# Patient Record
Sex: Female | Born: 1940
Health system: Southern US, Community
[De-identification: ages and names within clinical notes are randomized; demographics above are authoritative.]

## PROBLEM LIST (undated history)

## (undated) DIAGNOSIS — M1990 Unspecified osteoarthritis, unspecified site: Secondary | ICD-10-CM

## (undated) DIAGNOSIS — E785 Hyperlipidemia, unspecified: Secondary | ICD-10-CM

## (undated) DIAGNOSIS — K219 Gastro-esophageal reflux disease without esophagitis: Secondary | ICD-10-CM

## (undated) DIAGNOSIS — I472 Ventricular tachycardia, unspecified: Secondary | ICD-10-CM

## (undated) DIAGNOSIS — Z9289 Personal history of other medical treatment: Secondary | ICD-10-CM

## (undated) DIAGNOSIS — I2119 ST elevation (STEMI) myocardial infarction involving other coronary artery of inferior wall: Secondary | ICD-10-CM

## (undated) DIAGNOSIS — T7840XA Allergy, unspecified, initial encounter: Secondary | ICD-10-CM

## (undated) DIAGNOSIS — D219 Benign neoplasm of connective and other soft tissue, unspecified: Secondary | ICD-10-CM

## (undated) DIAGNOSIS — I2510 Atherosclerotic heart disease of native coronary artery without angina pectoris: Secondary | ICD-10-CM

## (undated) DIAGNOSIS — I10 Essential (primary) hypertension: Secondary | ICD-10-CM

## (undated) DIAGNOSIS — E669 Obesity, unspecified: Secondary | ICD-10-CM

## (undated) DIAGNOSIS — I255 Ischemic cardiomyopathy: Secondary | ICD-10-CM

## (undated) DIAGNOSIS — E039 Hypothyroidism, unspecified: Secondary | ICD-10-CM

## (undated) DIAGNOSIS — G454 Transient global amnesia: Secondary | ICD-10-CM

## (undated) DIAGNOSIS — L719 Rosacea, unspecified: Secondary | ICD-10-CM

## (undated) DIAGNOSIS — Z5189 Encounter for other specified aftercare: Secondary | ICD-10-CM

## (undated) DIAGNOSIS — K439 Ventral hernia without obstruction or gangrene: Secondary | ICD-10-CM

## (undated) HISTORY — PX: CERVICAL POLYPECTOMY: SHX88

## (undated) HISTORY — PX: OTHER SURGICAL HISTORY: SHX169

## (undated) HISTORY — PX: COCHLEAR IMPLANT: SUR684

## (undated) HISTORY — PX: CHOLECYSTECTOMY: SHX55

## (undated) HISTORY — DX: Allergy, unspecified, initial encounter: T78.40XA

## (undated) HISTORY — DX: Encounter for other specified aftercare: Z51.89

## (undated) HISTORY — DX: Personal history of other medical treatment: Z92.89

## (undated) HISTORY — PX: UPPER GASTROINTESTINAL ENDOSCOPY: SHX188

## (undated) HISTORY — DX: Transient global amnesia: G45.4

## (undated) HISTORY — DX: Gastro-esophageal reflux disease without esophagitis: K21.9

## (undated) HISTORY — DX: Rosacea, unspecified: L71.9

## (undated) HISTORY — DX: Ischemic cardiomyopathy: I25.5

---

## 1998-01-27 ENCOUNTER — Other Ambulatory Visit: Admission: RE | Admit: 1998-01-27 | Discharge: 1998-01-27 | Payer: Self-pay | Admitting: Obstetrics and Gynecology

## 2000-04-24 ENCOUNTER — Other Ambulatory Visit: Admission: RE | Admit: 2000-04-24 | Discharge: 2000-04-24 | Payer: Self-pay | Admitting: Obstetrics and Gynecology

## 2001-08-20 ENCOUNTER — Other Ambulatory Visit: Admission: RE | Admit: 2001-08-20 | Discharge: 2001-08-20 | Payer: Self-pay | Admitting: Obstetrics and Gynecology

## 2001-11-13 ENCOUNTER — Emergency Department (HOSPITAL_COMMUNITY): Admission: EM | Admit: 2001-11-13 | Discharge: 2001-11-13 | Payer: Self-pay | Admitting: Emergency Medicine

## 2003-07-08 ENCOUNTER — Other Ambulatory Visit: Admission: RE | Admit: 2003-07-08 | Discharge: 2003-07-08 | Payer: Self-pay | Admitting: Obstetrics and Gynecology

## 2004-07-12 ENCOUNTER — Other Ambulatory Visit: Admission: RE | Admit: 2004-07-12 | Discharge: 2004-07-12 | Payer: Self-pay | Admitting: Obstetrics and Gynecology

## 2007-11-19 ENCOUNTER — Ambulatory Visit: Payer: Self-pay | Admitting: Internal Medicine

## 2007-11-19 DIAGNOSIS — K219 Gastro-esophageal reflux disease without esophagitis: Secondary | ICD-10-CM

## 2007-11-19 HISTORY — DX: Gastro-esophageal reflux disease without esophagitis: K21.9

## 2007-11-20 ENCOUNTER — Ambulatory Visit: Payer: Self-pay | Admitting: Internal Medicine

## 2008-02-24 ENCOUNTER — Ambulatory Visit (HOSPITAL_COMMUNITY): Admission: RE | Admit: 2008-02-24 | Discharge: 2008-02-25 | Payer: Self-pay | Admitting: Otolaryngology

## 2008-09-09 ENCOUNTER — Ambulatory Visit (HOSPITAL_COMMUNITY): Admission: RE | Admit: 2008-09-09 | Discharge: 2008-09-09 | Payer: Self-pay | Admitting: Obstetrics and Gynecology

## 2008-09-09 ENCOUNTER — Encounter (INDEPENDENT_AMBULATORY_CARE_PROVIDER_SITE_OTHER): Payer: Self-pay | Admitting: Obstetrics and Gynecology

## 2009-05-09 ENCOUNTER — Ambulatory Visit (HOSPITAL_COMMUNITY): Admission: RE | Admit: 2009-05-09 | Discharge: 2009-05-09 | Payer: Self-pay | Admitting: Internal Medicine

## 2009-05-10 ENCOUNTER — Ambulatory Visit (HOSPITAL_COMMUNITY): Admission: RE | Admit: 2009-05-10 | Discharge: 2009-05-10 | Payer: Self-pay | Admitting: Internal Medicine

## 2009-06-16 ENCOUNTER — Encounter (INDEPENDENT_AMBULATORY_CARE_PROVIDER_SITE_OTHER): Payer: Self-pay | Admitting: Surgery

## 2009-06-16 ENCOUNTER — Ambulatory Visit (HOSPITAL_COMMUNITY): Admission: RE | Admit: 2009-06-16 | Discharge: 2009-06-17 | Payer: Self-pay | Admitting: Surgery

## 2009-07-03 ENCOUNTER — Encounter: Payer: Self-pay | Admitting: Internal Medicine

## 2010-04-17 NOTE — Letter (Signed)
Summary: [facility]   Imported By: Lester [place] 07/19/2009 10:12:01  _____________________________________________________________________  External Attachment:    Type:   Image     Comment:   External Document

## 2010-06-11 LAB — COMPREHENSIVE METABOLIC PANEL
AST: 22 U/L (ref 0–37)
BUN: 8 mg/dL (ref 6–23)
CO2: 28 mEq/L (ref 19–32)
Chloride: 106 mEq/L (ref 96–112)
Creatinine, Ser: 0.82 mg/dL (ref 0.4–1.2)
GFR calc Af Amer: >60 mL/min (ref >60)
Glucose, Bld: 91 mg/dL (ref 70–99)

## 2010-06-25 LAB — TYPE AND SCREEN: ABO/RH(D): A POS

## 2010-06-25 LAB — ABO/RH: ABO/RH(D): A POS

## 2010-06-25 LAB — COMPREHENSIVE METABOLIC PANEL
ALT: 24 U/L (ref 0–35)
Alkaline Phosphatase: 75 U/L (ref 39–117)
CO2: 28 mEq/L (ref 19–32)
Chloride: 106 mEq/L (ref 96–112)
GFR calc Af Amer: >60 mL/min (ref >60)
Potassium: 4 mEq/L (ref 3.5–5.1)
Total Bilirubin: 1 mg/dL (ref 0.3–1.2)

## 2010-06-25 LAB — CBC
Hemoglobin: 14.8 g/dL (ref 12.0–15.0)
MCV: 85.3 fL (ref 78.0–100.0)
RBC: 5.03 MIL/uL (ref 3.87–5.11)

## 2010-07-31 NOTE — H&P (Signed)
NAMEMARLISS, [name]             ACCOUNT NO.:  [id number]   MEDICAL RECORD NO.:  [id number]         PATIENT TYPE:  AMB   LOCATION:                                FACILITY:  [facility]   PHYSICIAN:  Duke Salvia. Marcelle Overlie, M.D.DATE OF BIRTH:  29-Jun-1940   DATE OF ADMISSION:  09/09/2008  DATE OF DISCHARGE:                              HISTORY & PHYSICAL   CHIEF COMPLAINT:  Abnormal bleeding, cervical polyp.   HISTORY OF PRESENT ILLNESS:  A 70 year old postmenopausal patient who  was in for a routine exam within the past month.  Findings at that time  demonstrated a large polyp protruding from the cervix.  She presents now  for D and C, hysteroscopy, and excision of a cervical polyp.  This  procedure including the risks related to bleeding, infection, adjacent  organ injury, all reviewed with her, which she understands and accepts.   PAST MEDICAL HISTORY:   ALLERGIES:  None.   CURRENT MEDICATIONS:  Synthroid, Nexium, MetroGel, baby aspirin daily,  and vitamin supplement.   SURGICAL HISTORY:  Tubal ligation in 1980.  Her medical doctor is Dr.  Jacky Kindle.   SOCIAL HISTORY:  Denies alcohol, tobacco, or drug use.  She is divorced.   REVIEW OF SYSTEMS:  Significant for hypothyroidism, currently on  Synthroid, Rosacea.   FAMILY HISTORY:  Significant for a mother with heart disease, kidney  disease, diabetes, and unspecified cancer.   PHYSICAL EXAMINATION:  VITAL SIGNS:  Temp 98.8, BP 150/90.  HEENT: Unremarkable.  NECK:  Supple without masses.  LUNGS:  Clear.  CARDIOVASCULAR:  Regular rate and rhythm without murmurs, rubs, or  gallops.  BREASTS:  Without masses.  ABDOMEN:  Soft, flat, and nontender.  PELVIC:  Vulva and vagina are normal.  There is a large polyp protruding  from the cervix.  Recent Pap showed ascus with atrophy.  Uterus  otherwise normal size.  Adnexa unremarkable.   IMPRESSION:  Large endocervical polyp.   PLAN:  D and C, hysteroscopy, removal of polyps.  Procedure  and risk  reviewed as above.      Richard M. Marcelle Overlie, M.D.  Electronically Signed     [facility]/MEDQ  D:  08/30/2008  T:  08/31/2008  Job:  [id number]

## 2010-07-31 NOTE — Op Note (Signed)
[name], [name]             ACCOUNT NO.:  [id number]   MEDICAL RECORD NO.:  [id number]          PATIENT TYPE:  OIB   LOCATION:  5128                         FACILITY:  [facility]   PHYSICIAN:  Newman Pies, MD            DATE OF BIRTH:  1940-10-09   DATE OF PROCEDURE:  02/24/2008  DATE OF DISCHARGE:                               OPERATIVE REPORT   SURGEON:  Newman Pies, MD   PREOPERATIVE DIAGNOSIS:  Bilateral profound sensorineural hearing loss.   POSTOPERATIVE DIAGNOSIS:  Bilateral profound sensorineural hearing loss.   PROCEDURE PERFORMED:  Right cochlear implantation.   ANESTHESIA:  General endotracheal tube anesthesia.   COMPLICATIONS:  None.   ESTIMATED BLOOD LOSS:  Minimal.   INDICATIONS FOR PROCEDURE:  [name] is a 70 year old white  female with a history of bilateral progressive sensorineural hearing  loss.  At her most recent evaluation, she was noted to have bilateral  profound hearing loss.  She was not able to obtain any benefit with her  hearing aids.  Based on the above findings, the decision was made for  the patient to undergo right cochlear implantation.  The risks,  benefits, alternatives, and details of the procedure were discussed with  the patient and her sister.  Questions were invited and answered.  Informed consent was obtained.   DESCRIPTION:  The patient was taken to the operating room and placed in  supine on the operating table.  General endotracheal tube anesthesia was  administered by the anesthesiologist.  Preop IV antibiotics were given.  The patient was positioned and prepped and draped in the standard  fashion for right cochlear implantation.  The patient's nerve-monitoring  electrodes were placed.  The facial nerve monitor was noted to be  operational throughout the case.  Lidocaine 1% with 1:100,000  epinephrine were injected in the postauricular crease.  A standard  postauricular incision was made.  The periosteum was elevated over the  mastoid bone.  A standard cortical mastoidectomy was performed.  The  tegmen, sigmoid sinus, and the posterior ear canal wall were used as the  landmark.  The antrum was subsequently entered.  The facial recess was  opened in the standard fashion.  The middle ear space was noted to be  normal.  The middle ear ossicles were all present.  At this point, the  attention was focused on making pocket for the implant processor.  The  periosteal soft tissue over the right skull was elevated.  A 5-0 cutting  drill was used to make an impression of the implant processor.  A  template was used to obtain the appropriate size impression.  Attention  was then refocused on the middle ear space.  A 1 mm cochleostomy was  performed.  Then, inner ear labyrinth was noted to be patent.  CI512  Nucleus cochlear implant was then placed in the skull pocket.  The  implant electrodes was inserted into the inner ear via the cochleostomy  opening without difficulty.  Its insertion was achieved.  The ground  electrode was then placed within the soft tissue  pocket.  The skin  incision was closed in layers with 3-0 Vicryl and 5-0 plain gut sutures.  A Glasscock dressing was applied.  The care of the patient was turned  over to the anesthesiologist.  Plain film of the skull was obtained,  confirming the placement of the cochlear implant electrodes.  The  patient was awakened from anesthesia without difficulty.  She was  transferred to the recovery room in good condition.   OPERATIVE FINDINGS:  An 1-mm medial cochleostomy was performed without  difficulty.  Nucleus CI512 cochlear implant was placed.   SPECIMENS REMOVED:  None.   FOLLOWUP CARE:  The patient will be observed overnight in the hospital.  She will be discharged home once postop day #1.  She will follow up in  my office in 1 week.      Newman Pies, MD  Electronically Signed     ST/MEDQ  D:  02/24/2008  T:  02/24/2008  Job:  [id number]

## 2010-07-31 NOTE — Op Note (Signed)
NAMEMONTSERRATH, MADDING             ACCOUNT NO.:  [id number]   MEDICAL RECORD NO.:  [id number]          PATIENT TYPE:  AMB   LOCATION:  SDC                           FACILITY:  [facility]   PHYSICIAN:  Duke Salvia. Marcelle Overlie, M.D.DATE OF BIRTH:  [birth year]   DATE OF PROCEDURE:  09/09/2008  DATE OF DISCHARGE:  09/09/2008                               OPERATIVE REPORT   PREOPERATIVE DIAGNOSIS:  Large endocervical polyp.   POSTOPERATIVE DIAGNOSIS:  Large endocervical polyp.   PROCEDURES:  Excision of large endocervical polyp, dilatation and  curettage, hysteroscopy.   SURGEON:  Duke Salvia. Marcelle Overlie, MD   ANESTHESIA:  General.   COMPLICATIONS:  None.   DRAINS:  In and out Foley catheter.   BLOOD LOSS:  Minimal.   SPECIMENS REMOVED:  Large endocervical polyp, endometrial curettings.   COMPLICATIONS:  None.   PROCEDURE AND FINDINGS:  The patient was taken to the operating room.  After an adequate level of general endotracheal anesthesia was obtained  with the patient's legs in stirrups, the perineum and vagina were  prepped and draped in the usual manner for D and C.  A weighted speculum  was positioned.  Cervix grasped with a tenaculum.  Paracervical block  created by infiltrating at 3 and 9 o'clock submucosally, 5-7 mL of 1%  Xylocaine on either side after negative aspiration.  Ring forceps then  used to grasp the large endocervical polyp, which was placed on some  traction with the intent to place an Endoloop over the origin to tie  down before excision, this avulsed during the process, was sent  separately.  At that point, the uterus was sounded to 7-8 cm, was  sufficiently dilated to allow passage of the 4-mm continuous  hysteroscope.  Moderate amount of tissue was noted, so the scope was  removed, D and C was then carried out with endometrial curettings, sent  separately.  The scope was then reinserted.  The cavity was irrigated  and noted to be at that point unremarkable.  Minimal  bleeding.  She  tolerated this well, went to recovery room in good condition.      Richard M. Marcelle Overlie, M.D.  Electronically Signed    [facility]/MEDQ  D:  09/09/2008  T:  09/10/2008  Job:  [id number]

## 2010-12-21 LAB — BASIC METABOLIC PANEL
Chloride: 108 mEq/L (ref 96–112)
GFR calc Af Amer: >60 mL/min (ref >60)
Potassium: 4.4 mEq/L (ref 3.5–5.1)
Sodium: 141 mEq/L (ref 135–145)

## 2010-12-21 LAB — CBC
HCT: 44.5 % (ref 36.0–46.0)
Hemoglobin: 14.6 g/dL (ref 12.0–15.0)
Platelets: 236 10*3/uL (ref 150–400)
RBC: 5.17 MIL/uL — ABNORMAL HIGH (ref 3.87–5.11)

## 2010-12-27 ENCOUNTER — Ambulatory Visit (AMBULATORY_SURGERY_CENTER): Payer: Medicare Other | Admitting: *Deleted

## 2010-12-27 VITALS — Ht 66.0 in | Wt 220.7 lb

## 2010-12-27 DIAGNOSIS — Z1211 Encounter for screening for malignant neoplasm of colon: Secondary | ICD-10-CM

## 2010-12-27 MED ORDER — PEG-KCL-NACL-NASULF-NA ASC-C 100 G PO SOLR: 1.0000 | Freq: Once | ORAL | Status: DC

## 2010-12-27 NOTE — Progress Notes (Signed)
CANNOT HAVE MRI due to COCHLEAR IMPLANT TO RIGHT EAR

## 2011-01-11 ENCOUNTER — Other Ambulatory Visit: Payer: Medicare Other | Admitting: Internal Medicine

## 2011-01-18 ENCOUNTER — Ambulatory Visit (AMBULATORY_SURGERY_CENTER): Payer: Medicare Other | Admitting: Internal Medicine

## 2011-01-18 ENCOUNTER — Encounter: Payer: Self-pay | Admitting: Internal Medicine

## 2011-01-18 VITALS — BP 145/82 | HR 76 | Temp 98.6°F | Resp 20 | Ht 66.0 in | Wt 220.0 lb

## 2011-01-18 DIAGNOSIS — D126 Benign neoplasm of colon, unspecified: Secondary | ICD-10-CM

## 2011-01-18 DIAGNOSIS — D128 Benign neoplasm of rectum: Secondary | ICD-10-CM

## 2011-01-18 DIAGNOSIS — D129 Benign neoplasm of anus and anal canal: Secondary | ICD-10-CM

## 2011-01-18 DIAGNOSIS — Z1211 Encounter for screening for malignant neoplasm of colon: Secondary | ICD-10-CM

## 2011-01-18 DIAGNOSIS — K5730 Diverticulosis of large intestine without perforation or abscess without bleeding: Secondary | ICD-10-CM

## 2011-01-18 MED ORDER — SODIUM CHLORIDE 0.9 % IV SOLN: 500.0000 mL | INTRAVENOUS | Status: DC

## 2011-01-18 NOTE — Patient Instructions (Signed)
Handouts on polyps and diverticulosis and high fiber diet  Discharge instructions per blue and green sheets  Repeat colonoscopy in 3 years. We will mail you a letter to remind you of this  We will send the polyps to pathology and we will mail you a letter in 1-2 weeks with the results and dr Broadus John recommendations

## 2011-01-21 ENCOUNTER — Telehealth: Payer: Self-pay | Admitting: *Deleted

## 2011-01-21 NOTE — Telephone Encounter (Signed)

## 2011-07-03 DIAGNOSIS — E785 Hyperlipidemia, unspecified: Secondary | ICD-10-CM | POA: Diagnosis not present

## 2011-07-03 DIAGNOSIS — I10 Essential (primary) hypertension: Secondary | ICD-10-CM | POA: Diagnosis not present

## 2011-07-03 DIAGNOSIS — E039 Hypothyroidism, unspecified: Secondary | ICD-10-CM | POA: Diagnosis not present

## 2011-07-03 DIAGNOSIS — K219 Gastro-esophageal reflux disease without esophagitis: Secondary | ICD-10-CM | POA: Diagnosis not present

## 2011-07-08 DIAGNOSIS — H16209 Unspecified keratoconjunctivitis, unspecified eye: Secondary | ICD-10-CM | POA: Diagnosis not present

## 2011-07-16 DIAGNOSIS — H16209 Unspecified keratoconjunctivitis, unspecified eye: Secondary | ICD-10-CM | POA: Diagnosis not present

## 2011-07-24 DIAGNOSIS — H2510 Age-related nuclear cataract, unspecified eye: Secondary | ICD-10-CM | POA: Diagnosis not present

## 2011-07-24 DIAGNOSIS — H5210 Myopia, unspecified eye: Secondary | ICD-10-CM | POA: Diagnosis not present

## 2011-08-29 DIAGNOSIS — H04129 Dry eye syndrome of unspecified lacrimal gland: Secondary | ICD-10-CM | POA: Diagnosis not present

## 2011-11-28 DIAGNOSIS — H903 Sensorineural hearing loss, bilateral: Secondary | ICD-10-CM | POA: Diagnosis not present

## 2011-12-06 DIAGNOSIS — H903 Sensorineural hearing loss, bilateral: Secondary | ICD-10-CM | POA: Diagnosis not present

## 2011-12-16 DIAGNOSIS — E039 Hypothyroidism, unspecified: Secondary | ICD-10-CM | POA: Diagnosis not present

## 2011-12-16 DIAGNOSIS — E785 Hyperlipidemia, unspecified: Secondary | ICD-10-CM | POA: Diagnosis not present

## 2011-12-16 DIAGNOSIS — I10 Essential (primary) hypertension: Secondary | ICD-10-CM | POA: Diagnosis not present

## 2011-12-23 DIAGNOSIS — E039 Hypothyroidism, unspecified: Secondary | ICD-10-CM | POA: Diagnosis not present

## 2011-12-23 DIAGNOSIS — Z0000 Encounter for general adult medical examination without abnormal findings: Secondary | ICD-10-CM | POA: Diagnosis not present

## 2011-12-23 DIAGNOSIS — Z23 Encounter for immunization: Secondary | ICD-10-CM | POA: Diagnosis not present

## 2011-12-23 DIAGNOSIS — I10 Essential (primary) hypertension: Secondary | ICD-10-CM | POA: Diagnosis not present

## 2011-12-23 DIAGNOSIS — E785 Hyperlipidemia, unspecified: Secondary | ICD-10-CM | POA: Diagnosis not present

## 2011-12-24 DIAGNOSIS — Z1212 Encounter for screening for malignant neoplasm of rectum: Secondary | ICD-10-CM | POA: Diagnosis not present

## 2011-12-25 DIAGNOSIS — Z01419 Encounter for gynecological examination (general) (routine) without abnormal findings: Secondary | ICD-10-CM | POA: Diagnosis not present

## 2011-12-25 DIAGNOSIS — Z124 Encounter for screening for malignant neoplasm of cervix: Secondary | ICD-10-CM | POA: Diagnosis not present

## 2012-01-02 DIAGNOSIS — Z1231 Encounter for screening mammogram for malignant neoplasm of breast: Secondary | ICD-10-CM | POA: Diagnosis not present

## 2012-01-08 DIAGNOSIS — R928 Other abnormal and inconclusive findings on diagnostic imaging of breast: Secondary | ICD-10-CM | POA: Diagnosis not present

## 2012-02-20 DIAGNOSIS — H903 Sensorineural hearing loss, bilateral: Secondary | ICD-10-CM | POA: Diagnosis not present

## 2012-06-17 DIAGNOSIS — I10 Essential (primary) hypertension: Secondary | ICD-10-CM | POA: Diagnosis not present

## 2012-06-17 DIAGNOSIS — K219 Gastro-esophageal reflux disease without esophagitis: Secondary | ICD-10-CM | POA: Diagnosis not present

## 2012-06-17 DIAGNOSIS — E039 Hypothyroidism, unspecified: Secondary | ICD-10-CM | POA: Diagnosis not present

## 2012-06-17 DIAGNOSIS — E785 Hyperlipidemia, unspecified: Secondary | ICD-10-CM | POA: Diagnosis not present

## 2012-07-16 DIAGNOSIS — H903 Sensorineural hearing loss, bilateral: Secondary | ICD-10-CM | POA: Diagnosis not present

## 2012-07-30 DIAGNOSIS — H903 Sensorineural hearing loss, bilateral: Secondary | ICD-10-CM | POA: Diagnosis not present

## 2012-11-11 DIAGNOSIS — H2510 Age-related nuclear cataract, unspecified eye: Secondary | ICD-10-CM | POA: Diagnosis not present

## 2012-12-18 DIAGNOSIS — E785 Hyperlipidemia, unspecified: Secondary | ICD-10-CM | POA: Diagnosis not present

## 2012-12-18 DIAGNOSIS — E039 Hypothyroidism, unspecified: Secondary | ICD-10-CM | POA: Diagnosis not present

## 2012-12-18 DIAGNOSIS — I10 Essential (primary) hypertension: Secondary | ICD-10-CM | POA: Diagnosis not present

## 2012-12-24 DIAGNOSIS — Z1212 Encounter for screening for malignant neoplasm of rectum: Secondary | ICD-10-CM | POA: Diagnosis not present

## 2012-12-25 DIAGNOSIS — E039 Hypothyroidism, unspecified: Secondary | ICD-10-CM | POA: Diagnosis not present

## 2012-12-25 DIAGNOSIS — K219 Gastro-esophageal reflux disease without esophagitis: Secondary | ICD-10-CM | POA: Diagnosis not present

## 2012-12-25 DIAGNOSIS — E785 Hyperlipidemia, unspecified: Secondary | ICD-10-CM | POA: Diagnosis not present

## 2012-12-25 DIAGNOSIS — J309 Allergic rhinitis, unspecified: Secondary | ICD-10-CM | POA: Diagnosis not present

## 2012-12-25 DIAGNOSIS — Z1331 Encounter for screening for depression: Secondary | ICD-10-CM | POA: Diagnosis not present

## 2012-12-25 DIAGNOSIS — Z0000 Encounter for general adult medical examination without abnormal findings: Secondary | ICD-10-CM | POA: Diagnosis not present

## 2012-12-25 DIAGNOSIS — Z23 Encounter for immunization: Secondary | ICD-10-CM | POA: Diagnosis not present

## 2012-12-25 DIAGNOSIS — Z6839 Body mass index (BMI) 39.0-39.9, adult: Secondary | ICD-10-CM | POA: Diagnosis not present

## 2012-12-25 DIAGNOSIS — I10 Essential (primary) hypertension: Secondary | ICD-10-CM | POA: Diagnosis not present

## 2013-01-06 DIAGNOSIS — H903 Sensorineural hearing loss, bilateral: Secondary | ICD-10-CM | POA: Diagnosis not present

## 2013-01-07 DIAGNOSIS — Z1231 Encounter for screening mammogram for malignant neoplasm of breast: Secondary | ICD-10-CM | POA: Diagnosis not present

## 2013-03-24 ENCOUNTER — Inpatient Hospital Stay (HOSPITAL_COMMUNITY)
Admission: EM | Admit: 2013-03-24 | Discharge: 2013-03-28 | DRG: 249 | Disposition: A | Discharge status: Home / Self Care | Payer: Medicare Other | Attending: Interventional Cardiology | Admitting: Interventional Cardiology

## 2013-03-24 ENCOUNTER — Encounter (HOSPITAL_COMMUNITY)
Admission: EM | Disposition: A | Discharge status: Home / Self Care | Payer: Self-pay | Source: Home / Self Care | Attending: Interventional Cardiology

## 2013-03-24 ENCOUNTER — Ambulatory Visit (HOSPITAL_COMMUNITY): Admit: 2013-03-24 | Payer: Self-pay | Admitting: Interventional Cardiology

## 2013-03-24 ENCOUNTER — Encounter (HOSPITAL_COMMUNITY): Payer: Self-pay | Admitting: Emergency Medicine

## 2013-03-24 DIAGNOSIS — E039 Hypothyroidism, unspecified: Secondary | ICD-10-CM | POA: Diagnosis not present

## 2013-03-24 DIAGNOSIS — I4729 Other ventricular tachycardia: Secondary | ICD-10-CM | POA: Diagnosis present

## 2013-03-24 DIAGNOSIS — Z79899 Other long term (current) drug therapy: Secondary | ICD-10-CM

## 2013-03-24 DIAGNOSIS — I2510 Atherosclerotic heart disease of native coronary artery without angina pectoris: Secondary | ICD-10-CM | POA: Diagnosis not present

## 2013-03-24 DIAGNOSIS — I472 Ventricular tachycardia, unspecified: Secondary | ICD-10-CM | POA: Diagnosis not present

## 2013-03-24 DIAGNOSIS — I10 Essential (primary) hypertension: Secondary | ICD-10-CM | POA: Diagnosis not present

## 2013-03-24 DIAGNOSIS — K219 Gastro-esophageal reflux disease without esophagitis: Secondary | ICD-10-CM

## 2013-03-24 DIAGNOSIS — T7840XA Allergy, unspecified, initial encounter: Secondary | ICD-10-CM

## 2013-03-24 DIAGNOSIS — R0600 Dyspnea, unspecified: Secondary | ICD-10-CM

## 2013-03-24 DIAGNOSIS — I498 Other specified cardiac arrhythmias: Secondary | ICD-10-CM | POA: Diagnosis present

## 2013-03-24 DIAGNOSIS — I2119 ST elevation (STEMI) myocardial infarction involving other coronary artery of inferior wall: Principal | ICD-10-CM

## 2013-03-24 DIAGNOSIS — L719 Rosacea, unspecified: Secondary | ICD-10-CM | POA: Diagnosis present

## 2013-03-24 DIAGNOSIS — Z91041 Radiographic dye allergy status: Secondary | ICD-10-CM

## 2013-03-24 DIAGNOSIS — R7309 Other abnormal glucose: Secondary | ICD-10-CM | POA: Diagnosis present

## 2013-03-24 DIAGNOSIS — G4730 Sleep apnea, unspecified: Secondary | ICD-10-CM

## 2013-03-24 DIAGNOSIS — E785 Hyperlipidemia, unspecified: Secondary | ICD-10-CM

## 2013-03-24 DIAGNOSIS — R072 Precordial pain: Secondary | ICD-10-CM | POA: Diagnosis not present

## 2013-03-24 DIAGNOSIS — I213 ST elevation (STEMI) myocardial infarction of unspecified site: Secondary | ICD-10-CM

## 2013-03-24 DIAGNOSIS — Z7982 Long term (current) use of aspirin: Secondary | ICD-10-CM

## 2013-03-24 DIAGNOSIS — I059 Rheumatic mitral valve disease, unspecified: Secondary | ICD-10-CM | POA: Diagnosis not present

## 2013-03-24 DIAGNOSIS — Z955 Presence of coronary angioplasty implant and graft: Secondary | ICD-10-CM

## 2013-03-24 DIAGNOSIS — E669 Obesity, unspecified: Secondary | ICD-10-CM | POA: Diagnosis not present

## 2013-03-24 DIAGNOSIS — I219 Acute myocardial infarction, unspecified: Secondary | ICD-10-CM | POA: Diagnosis not present

## 2013-03-24 HISTORY — DX: Obesity, unspecified: E66.9

## 2013-03-24 HISTORY — DX: Hypothyroidism, unspecified: E03.9

## 2013-03-24 HISTORY — DX: ST elevation (STEMI) myocardial infarction involving other coronary artery of inferior wall: I21.19

## 2013-03-24 HISTORY — DX: Benign neoplasm of connective and other soft tissue, unspecified: D21.9

## 2013-03-24 HISTORY — DX: Ventricular tachycardia: I47.2

## 2013-03-24 HISTORY — DX: Hyperlipidemia, unspecified: E78.5

## 2013-03-24 HISTORY — DX: Ventricular tachycardia, unspecified: I47.20

## 2013-03-24 HISTORY — DX: Atherosclerotic heart disease of native coronary artery without angina pectoris: I25.10

## 2013-03-24 HISTORY — DX: Essential (primary) hypertension: I10

## 2013-03-24 LAB — TROPONIN I: TROPONIN I: 15.3 ng/mL — AB (ref <0.30)

## 2013-03-24 LAB — POCT ACTIVATED CLOTTING TIME
Activated Clotting Time: 243 seconds
Activated Clotting Time: 254 seconds

## 2013-03-24 LAB — POCT I-STAT TROPONIN I: TROPONIN I, POC: 0.45 ng/mL — AB (ref 0.00–0.08)

## 2013-03-24 LAB — COMPREHENSIVE METABOLIC PANEL
ALT: 17 U/L (ref 0–35)
AST: 23 U/L (ref 0–37)
Albumin: 3.8 g/dL (ref 3.5–5.2)
Alkaline Phosphatase: 98 U/L (ref 39–117)
BUN: 12 mg/dL (ref 6–23)
CO2: 24 meq/L (ref 19–32)
CREATININE: 0.83 mg/dL (ref 0.50–1.10)
Calcium: 9.2 mg/dL (ref 8.4–10.5)
Chloride: 102 mEq/L (ref 96–112)
GFR calc Af Amer: 81 mL/min — ABNORMAL LOW (ref >90)
GFR calc non Af Amer: 70 mL/min — ABNORMAL LOW (ref >90)
Glucose, Bld: 150 mg/dL — ABNORMAL HIGH (ref 70–99)
Potassium: 3.4 mEq/L — ABNORMAL LOW (ref 3.7–5.3)
Sodium: 141 mEq/L (ref 137–147)
Total Bilirubin: 0.2 mg/dL — ABNORMAL LOW (ref 0.3–1.2)
Total Protein: 7.6 g/dL (ref 6.0–8.3)

## 2013-03-24 LAB — CBC
HEMATOCRIT: 43.3 % (ref 36.0–46.0)
Hemoglobin: 14.8 g/dL (ref 12.0–15.0)
MCH: 29.5 pg (ref 26.0–34.0)
MCHC: 34.2 g/dL (ref 30.0–36.0)
MCV: 86.3 fL (ref 78.0–100.0)
PLATELETS: 226 10*3/uL (ref 150–400)
RBC: 5.02 MIL/uL (ref 3.87–5.11)
RDW: 13.4 % (ref 11.5–15.5)
WBC: 8.9 10*3/uL (ref 4.0–10.5)

## 2013-03-24 LAB — APTT: aPTT: 31 seconds (ref 24–37)

## 2013-03-24 LAB — MRSA PCR SCREENING: MRSA by PCR: NEGATIVE

## 2013-03-24 LAB — PROTIME-INR
INR: 1.11 (ref 0.00–1.49)
Prothrombin Time: 14.1 seconds (ref 11.6–15.2)

## 2013-03-24 SURGERY — LEFT HEART CATHETERIZATION WITH CORONARY ANGIOGRAM
Anesthesia: LOCAL

## 2013-03-24 SURGERY — LEFT HEART CATHETERIZATION WITH CORONARY ANGIOGRAM
Anesthesia: Moderate Sedation

## 2013-03-24 MED ORDER — SODIUM CHLORIDE 0.9 % IJ SOLN: 3.0000 mL | INTRAMUSCULAR | Status: DC | PRN

## 2013-03-24 MED ORDER — METRONIDAZOLE 0.75 % EX GEL
1.0000 "application " | Freq: Every day | CUTANEOUS | Status: DC
  Administered 2013-03-25 – 2013-03-27 (×2): 1 via TOPICAL
  Filled 2013-03-24: qty 45

## 2013-03-24 MED ORDER — ONDANSETRON HCL 4 MG/2ML IJ SOLN
INTRAMUSCULAR | Status: AC
  Filled 2013-03-24: qty 2

## 2013-03-24 MED ORDER — NITROGLYCERIN 0.4 MG SL SUBL
0.4000 mg | SUBLINGUAL_TABLET | SUBLINGUAL | Status: DC | PRN
  Administered 2013-03-24 (×2): 0.4 mg via SUBLINGUAL
  Filled 2013-03-24: qty 25

## 2013-03-24 MED ORDER — TICAGRELOR 90 MG PO TABS
ORAL_TABLET | ORAL | Status: AC
  Filled 2013-03-24: qty 2

## 2013-03-24 MED ORDER — SODIUM CHLORIDE 0.9 % IV SOLN
INTRAVENOUS | Status: DC
  Administered 2013-03-24: 20:00:00 via INTRAVENOUS

## 2013-03-24 MED ORDER — ASPIRIN 81 MG PO CHEW
81.0000 mg | CHEWABLE_TABLET | Freq: Every day | ORAL | Status: DC
  Administered 2013-03-25 – 2013-03-28 (×4): 81 mg via ORAL
  Filled 2013-03-24 (×4): qty 1

## 2013-03-24 MED ORDER — ASPIRIN 81 MG PO CHEW: 81.0000 mg | CHEWABLE_TABLET | Freq: Every day | ORAL | Status: DC

## 2013-03-24 MED ORDER — VERAPAMIL HCL 2.5 MG/ML IV SOLN
INTRAVENOUS | Status: AC
  Filled 2013-03-24: qty 2

## 2013-03-24 MED ORDER — TICAGRELOR 90 MG PO TABS
90.0000 mg | ORAL_TABLET | Freq: Two times a day (BID) | ORAL | Status: DC
  Administered 2013-03-24 – 2013-03-28 (×8): 90 mg via ORAL
  Filled 2013-03-24 (×9): qty 1

## 2013-03-24 MED ORDER — ACETAMINOPHEN 325 MG PO TABS: 650.0000 mg | ORAL_TABLET | ORAL | Status: DC | PRN

## 2013-03-24 MED ORDER — SODIUM CHLORIDE 0.9 % IV SOLN
INTRAVENOUS | Status: DC
  Administered 2013-03-25: 1 mL via INTRAVENOUS

## 2013-03-24 MED ORDER — ONDANSETRON HCL 4 MG/2ML IJ SOLN: 4.0000 mg | Freq: Four times a day (QID) | INTRAMUSCULAR | Status: DC | PRN

## 2013-03-24 MED ORDER — NITROGLYCERIN 0.2 MG/ML ON CALL CATH LAB
INTRAVENOUS | Status: AC
  Filled 2013-03-24: qty 1

## 2013-03-24 MED ORDER — SODIUM CHLORIDE 0.9 % IJ SOLN
3.0000 mL | Freq: Two times a day (BID) | INTRAMUSCULAR | Status: DC
  Administered 2013-03-24 – 2013-03-27 (×5): 3 mL via INTRAVENOUS

## 2013-03-24 MED ORDER — HEPARIN SODIUM (PORCINE) 1000 UNIT/ML IJ SOLN
INTRAMUSCULAR | Status: AC
  Filled 2013-03-24: qty 1

## 2013-03-24 MED ORDER — DIPHENHYDRAMINE HCL 50 MG/ML IJ SOLN
INTRAMUSCULAR | Status: AC
  Filled 2013-03-24: qty 1

## 2013-03-24 MED ORDER — HEPARIN SODIUM (PORCINE) 5000 UNIT/ML IJ SOLN
5000.0000 [IU] | Freq: Three times a day (TID) | INTRAMUSCULAR | Status: DC
  Administered 2013-03-24: 5000 [IU] via SUBCUTANEOUS
  Filled 2013-03-24 (×5): qty 1

## 2013-03-24 MED ORDER — LIDOCAINE HCL (PF) 1 % IJ SOLN
INTRAMUSCULAR | Status: AC
  Filled 2013-03-24: qty 30

## 2013-03-24 MED ORDER — HEPARIN SODIUM (PORCINE) 5000 UNIT/ML IJ SOLN
60.0000 [IU]/kg | INTRAMUSCULAR | Status: AC
  Administered 2013-03-24: 4000 [IU] via INTRAVENOUS

## 2013-03-24 MED ORDER — MIDAZOLAM HCL 2 MG/2ML IJ SOLN
INTRAMUSCULAR | Status: AC
  Filled 2013-03-24: qty 2

## 2013-03-24 MED ORDER — FENTANYL CITRATE 0.05 MG/ML IJ SOLN
INTRAMUSCULAR | Status: AC
  Filled 2013-03-24: qty 2

## 2013-03-24 MED ORDER — LEVOTHYROXINE SODIUM 88 MCG PO TABS
88.0000 ug | ORAL_TABLET | Freq: Every day | ORAL | Status: DC
  Administered 2013-03-25 – 2013-03-26 (×2): 88 ug via ORAL
  Filled 2013-03-24 (×3): qty 1

## 2013-03-24 MED ORDER — METOPROLOL TARTRATE 12.5 MG HALF TABLET
12.5000 mg | ORAL_TABLET | Freq: Two times a day (BID) | ORAL | Status: DC
  Administered 2013-03-24 – 2013-03-28 (×6): 12.5 mg via ORAL
  Filled 2013-03-24 (×9): qty 1

## 2013-03-24 MED ORDER — SODIUM CHLORIDE 0.9 % IV SOLN: 250.0000 mL | INTRAVENOUS | Status: DC | PRN

## 2013-03-24 MED ORDER — METHYLPREDNISOLONE SODIUM SUCC 125 MG IJ SOLR
INTRAMUSCULAR | Status: AC
  Filled 2013-03-24: qty 2

## 2013-03-24 MED ORDER — PANTOPRAZOLE SODIUM 40 MG PO TBEC
40.0000 mg | DELAYED_RELEASE_TABLET | Freq: Every day | ORAL | Status: DC
  Administered 2013-03-24 – 2013-03-27 (×4): 40 mg via ORAL
  Filled 2013-03-24 (×4): qty 1

## 2013-03-24 MED ORDER — HEPARIN SODIUM (PORCINE) 5000 UNIT/ML IJ SOLN
INTRAMUSCULAR | Status: AC
  Filled 2013-03-24: qty 1

## 2013-03-24 MED ORDER — HEPARIN (PORCINE) IN NACL 2-0.9 UNIT/ML-% IJ SOLN
INTRAMUSCULAR | Status: AC
  Filled 2013-03-24: qty 1000

## 2013-03-24 MED ORDER — ATORVASTATIN CALCIUM 80 MG PO TABS
80.0000 mg | ORAL_TABLET | Freq: Every day | ORAL | Status: DC
  Administered 2013-03-25 – 2013-03-28 (×4): 80 mg via ORAL
  Filled 2013-03-24 (×4): qty 1

## 2013-03-24 NOTE — ED Notes (Signed)
Patient presents today with a chief complaint of substernal chest pain with radiation down her right arm and through her back. Patient reports mild SOB and reports pain occurred while patient was at rest. Patient denies nausea and vomiting.

## 2013-03-24 NOTE — ED Notes (Signed)
Pt brought back to A8- hooked up to cardiac monitor. Placed in gown. Clothes and belongings (purse) given to family member. Pt made aware. Pt updated on plan of care and cath lab by this RN and Dr. Rogene Houston. Pt alert and oriented x4, skin warm and dry. Pt HOH. sts has 8/10 mid-sternal CP radiating to back. 2 large bore IV's placed. Pt placed on Zoll pads & monitor give heparin. Pt sts only allergy is to contrast sts reaction- hives. Pt. Mentioned vaginal spotting x3 days. Cardiologist at bedside aware. Pt transported to cath lab by this RN, [facility] RN and cardiologist NP. Arrived at cath lab at [facility].

## 2013-03-24 NOTE — CV Procedure (Signed)
PROCEDURE:  Left heart catheterization with selective coronary angiography, left ventriculogram.  Aspiration thrombectomy.  PCI proximal left circumflex  INDICATIONS:  Acute Inferoposterior STEMI  The risks, benefits, and details of the procedure were explained to the patient.  The patient verbalized understanding and wanted to proceed.  Informed written consent was obtained.  PROCEDURE TECHNIQUE:  After Xylocaine anesthesia a 19F slender sheath was placed in the right radial artery with a single anterior needle wall stick. Heparin was given IV.   Right coronary angiography was done using a Judkins R4 guide catheter.  Left coronary angiography was done using a Judkins L3.5 guide catheter.  Left ventriculography was done using a pigtail catheter.  Intra-arterial nitroglycerin was given through the radial sheath to treat spasm. A TR band was used for hemostasis.   CONTRAST:  Total of 165 cc.  COMPLICATIONS:  None.    HEMODYNAMICS:  Aortic pressure was 131/71; LV pressure was 130/16; LVEDP 26.  There was no gradient between the left ventricle and aorta.    ANGIOGRAPHIC DATA:   The left main coronary artery is short, but widely patent.  The left anterior descending artery is large vessel with mild irregularities. There are several small to medium-sized diagonal vessels which are widely patent.    The left circumflex artery is a large vessel. The proximal vessel was occluded. After revascularization, it was noted that there is a large first obtuse marginal. The remainder of the circumflex appears angiographically normal. There is a large second obtuse marginal which is patent.  The right coronary artery is a large dominant vessel. There is mild ostial disease. The posterior descending vessel is small but patent. The posterior lateral artery is small but patent.  LEFT VENTRICULOGRAM:  Left ventricular angiogram was done in the 30 RAO projection and revealed normal left ventricular wall  motion and systolic function with an estimated ejection fraction of 55 %.  LVEDP was 26  MmHg.  PCI NARRATIVE: A CLS 3.0 guiding catheters using his left main. The guide catheter selectively engage the LAD. A pro-water wire was placed into the LAD and the guide catheter was pulled back. A BMW wire was placed into the circumflex, and eventually across the occlusion which is the culprit for her presentation today. A priority 1 aspiration catheter was used with 2 passes made. TIMI-3 flow was restored. There is successful removal of thrombus.  A 2.5 x 12 balloon was used to predilate the lesion. A 3.0 x 12 vision stent was deployed at 18 atmospheres. The stent was post dilated with a 3.5 x 8 noncompliant balloon. There is an excellent angiographic result.  There is no residual stenosis. The patient did have some bradycardia with reperfusion. This resolved spontaneously before any atropine had been given. She had some nausea post intervention which was treated with Zofran.  IMPRESSIONS:  1. Normal left main coronary artery. 2. Widely patent left anterior descending artery and its branches. 3. Occluded proximal left circumflex artery which was successfully stented with a 3.0 x 12 bare-metal vision stent.  The stent was post dilated to greater than  3.5 mm in diameter. 4. Mild disease in the large, dominant right coronary artery. 5. Normal left ventricular systolic function.  LVEDP 26 mmHg.  Ejection fraction 55%.  RECOMMENDATION:  Continue dual antiplatelet therapy for at least a month. He bare-metal stent was chosen because the patient has had some dysfunctional uterine bleeding and there is concern for fibroids. The occlusion was short and  that vessel diameter was large. She is nondiabetic. Hopefully, she will do well with a bare-metal stent. Last initiate statin and beta blocker as tolerated. Hopefully, she can move out of the unit tomorrow. If she does well, a discharge on Friday would be reasonable.

## 2013-03-24 NOTE — H&P (Signed)
Patient ID: [name] MRN: [id number], DOB/AGE: 1940-12-30   Admit date: 03/24/2013   Primary Physician: [name], MD Primary Cardiologist: new to  - seen by Lendell Caprice, MD   Pt. Profile:  73 year old female without prior cardiac history presented to the ED today with a 3 hour history of chest pain and has been found to have inferior ST segment elevation.  Problem List  Past Medical History  Diagnosis Date  . IV Contrast Allergy   . GERD (gastroesophageal reflux disease)   . Hypothyroidism   . Rosacea   . Hearing loss     a. s/p cochlear implant on right, hearing aid on left.  . Obesity     Past Surgical History  Procedure Laterality Date  . Cochlear implant    . Upper gastrointestinal endoscopy    . Cervical polypectomy      2010  . Cholecystectomy    . Uterine polyp      had D and C done to remove the polyp     Allergies  Allergies  Allergen Reactions  . Iohexol Hives    HPI  73 year old female without prior cardiac history. She does have a history of treated hypertension and hypothyroidism. She was in her usual state of health until approximately 3:30 this afternoon when she developed severe substernal chest pressure associated with mild dyspnea. She took a full strength aspirin without significant relief and after pain persisted for greater than an hour, she presented to the [facility] for evaluation. Here, ECG shows inferolateral ST segment elevation. A code STEMI was activated and patient was taken emergently to the cardiac catheterization laboratory. She continues to have moderate chest pain but is otherwise hemodynamically stable.  Home Medications  Prior to Admission medications   Medication Sig Start Date End Date Taking? Authorizing Provider  aspirin 81 MG tablet Take 81 mg by mouth daily.      Historical Provider, MD  Calcium Carbonate-Vit D-Min (GNP CALCIUM PLUS 600 +D PO) Take by mouth. Twice a week     Historical Provider, MD    esomeprazole (NEXIUM) 40 MG capsule Take 40 mg by mouth daily before breakfast.      Historical Provider, MD  levothyroxine (SYNTHROID, LEVOTHROID) 88 MCG tablet Take 88 mcg by mouth daily. Extra 1/2 on Sunday and Monday only     Historical Provider, MD  Methylcellulose, Laxative, (CITRUCEL PO) Take 1 tablet by mouth daily.      Historical Provider, MD  metroNIDAZOLE (METROGEL) 1 % gel Apply 1 application topically daily.      Historical Provider, MD  Omega-3 Fatty Acids (OMEGA 3 PO) Take 1 tablet by mouth daily.      Historical Provider, MD   Family History  Family History  Problem Relation Age of Onset  . Esophageal cancer Neg Hx   . Stomach cancer Neg Hx   . Colon cancer Cousin   . Heart failure Mother     died in her 16's.  . Other Father     died in [place] History  History   Social History  . Marital Status: Divorced    Spouse Name: N/A    Number of Children: N/A  . Years of Education: N/A   Occupational History  . Not on file.   Social History Main Topics  . Smoking status: Never Smoker   . Smokeless tobacco: Not on file  . Alcohol Use: No  . Drug Use: No  .  Sexual Activity:    Other Topics Concern  . Not on file   Social History Narrative   Lives in [place] by herself.  Sister is nearby and is Healthcare POA.     Review of Systems General:  No chills, fever, night sweats or weight changes.  Cardiovascular:  Positive chest pain, no dyspnea on exertion, edema, orthopnea, palpitations, paroxysmal nocturnal dyspnea. Dermatological: No rash, lesions/masses Respiratory: No cough, dyspnea Urologic: No hematuria, dysuria Abdominal:   No nausea, vomiting, diarrhea, bright red blood per rectum, melena, or hematemesis Neurologic:  No visual changes, wkns, changes in mental status. GYN: Patient reports scant vaginal spotting since Christmas and is scheduled to see her GYN soon. All other systems reviewed and are otherwise negative except as noted  above.  Physical Exam  Blood pressure 142/67, pulse 79, temperature 97.4 F (36.3 C), temperature source Axillary, resp. rate 18, SpO2 98.00%.  General: Pleasant, NAD Psych: Normal affect. Neuro: Alert and oriented X 3. Moves all extremities spontaneously. HEENT: Normal  Neck: Supple without bruits or JVD. Lungs:  Resp regular and unlabored, CTA. Heart: RRR no s3, s4, or murmurs. Abdomen: Soft, non-tender, non-distended, BS + x 4.  Extremities: No clubbing, cyanosis or edema. DP/PT/Radials 2+ and equal bilaterally.  Labs  All labs are currently pending.   Radiology/Studies  No results found.  ECG  Regular sinus rhythm, 65, 1-2 mm ST segment elevation in leads 2, 3, aVF, V4 through V6. ST depression in 1, aVL and  ASSESSMENT AND PLAN  1. Acute inferolateral ST segment elevation myocardial infarction: Patient presented with a 2 to three-hour history of chest pain unrelieved by aspirin at home. ECG shows inferolateral ST elevation with reciprocal changes. She's been taken to the cardiac catheterization laboratory for emergent diagnostic catheterization. We'll plan to add aspirin, beta blocker, and high potency statin. P2Y12 inhibitor at the discretion of the interventional cardiologist. Eventual cardiac rehabilitation.   2. Hypertension: Stable. Follow with addition of beta blocker.  3. Lipid status: Currently unknown. Adding Lipitor 80 mg. Followup lipids and LFTs.  4. Hypothyroidism: Continue Synthroid therapy. Check TSH.  5. GERD: Continue PPI.  6. New bleeding/spotting: Follow H&H with initiation of aspirin and P2Y12 inhibitor.  7.  Obesity:  Eventual cardiac rehab.  Signed, Murray Hodgkins, NP 03/24/2013, 6:39 PM  I have examined the patient and reviewed assessment and plan and discussed with patient.  Agree with above as stated.  Acute inferoposterior MI. She'll be brought emergently to the Cath Lab. Further plans based on the results of the cardiac cath. If  possible, would lean towards a bare-metal stent due to her dysfunctional uterine bleeding and possible fibroids.  Yoltzin Ransom S.

## 2013-03-24 NOTE — ED Provider Notes (Signed)
CSN: [id number]     Arrival date & time 03/24/13  1800 History   None    Chief Complaint  Patient presents with  . Chest Pain   (Consider location/radiation/quality/duration/timing/severity/associated sxs/prior Treatment) Patient is a 73 y.o. female presenting with chest pain. The history is provided by the patient and a friend.  Chest Pain Associated symptoms: back pain, headache and shortness of breath   Associated symptoms: no abdominal pain, no fever, no nausea and not vomiting    patient with onset of substernal right-sided substernal chest pain at about 3:30 in the afternoon radiated to the right arm that resolved now radiating directly to the back. Pain at 8/10 patient did take aspirin at home. Patient has a negative cardiac history however recently started on hypertensive meds. No history of any MI or known cardiac disease. Associated with some shortness of breath no nausea no vomiting in triage patient's EKG consistent with inferior MI code STEMI called.  Pain described as a sharp ache. No history of similar pain.  Past Medical History  Diagnosis Date  . Allergy   . GERD (gastroesophageal reflux disease)   . Thyroid disease     hypothyroid  . Rosacea   . Hearing loss    Past Surgical History  Procedure Laterality Date  . Cochlear implant    . Upper gastrointestinal endoscopy    . Cervical polypectomy      2010  . Cholecystectomy    . Uterine polyp      had D and C done to remove the polyp   Family History  Problem Relation Age of Onset  . Esophageal cancer Neg Hx   . Stomach cancer Neg Hx   . Colon cancer Cousin    History  Substance Use Topics  . Smoking status: Never Smoker   . Smokeless tobacco: Not on file  . Alcohol Use: No   OB History   Grav Para Term Preterm Abortions TAB SAB Ect Mult Living                 Review of Systems  Constitutional: Negative for fever.  HENT: Positive for hearing loss. Negative for congestion.   Eyes: Negative for visual  disturbance.  Respiratory: Positive for shortness of breath.   Cardiovascular: Positive for chest pain.  Gastrointestinal: Negative for nausea, vomiting and abdominal pain.  Genitourinary: Negative for dysuria.  Musculoskeletal: Positive for back pain.  Skin: Negative for rash.  Neurological: Positive for headaches.  Hematological: Does not bruise/bleed easily.  Psychiatric/Behavioral: Negative for confusion.    Allergies  Iohexol  Home Medications   Current Outpatient Rx  Name  Route  Sig  Dispense  Refill  . aspirin 81 MG tablet   Oral   Take 81 mg by mouth daily.           . Calcium Carbonate-Vit D-Min (GNP CALCIUM PLUS 600 +D PO)   Oral   Take by mouth. Twice a week          . esomeprazole (NEXIUM) 40 MG capsule   Oral   Take 40 mg by mouth daily before breakfast.           . levothyroxine (SYNTHROID, LEVOTHROID) 88 MCG tablet   Oral   Take 88 mcg by mouth daily. Extra 1/2 on Sunday and Monday only          . Methylcellulose, Laxative, (CITRUCEL PO)   Oral   Take 1 tablet by mouth daily.           Marland Kitchen  metroNIDAZOLE (METROGEL) 1 % gel   Topical   Apply 1 application topically daily.           . Omega-3 Fatty Acids (OMEGA 3 PO)   Oral   Take 1 tablet by mouth daily.            BP 142/67  Pulse 79  Resp 18  SpO2 98% Physical Exam  Nursing note and vitals reviewed. Constitutional: She is oriented to person, place, and time. She appears well-developed and well-nourished. No distress.  HENT:  Head: Normocephalic and atraumatic.  Mouth/Throat: Oropharynx is clear and moist.  Eyes: Conjunctivae and EOM are normal. Pupils are equal, round, and reactive to light.  Neck: Normal range of motion.  Cardiovascular: Normal rate, regular rhythm and normal heart sounds.   No murmur heard. Pulmonary/Chest: Effort normal and breath sounds normal. No respiratory distress.  Abdominal: Soft. Bowel sounds are normal. There is no tenderness.  Musculoskeletal:  Normal range of motion. She exhibits no edema.  Neurological: She is alert and oriented to person, place, and time. No cranial nerve deficit. She exhibits normal muscle tone. Coordination normal.  Skin: Skin is warm. No rash noted.    ED Course  Procedures (including critical care time) Labs Review Labs Reviewed  APTT  CBC  COMPREHENSIVE METABOLIC PANEL  PROTIME-INR   Imaging Review No results found.  EKG Interpretation    Date/Time:  Wednesday March 24 2013 18:07:01 EST Ventricular Rate:  65 PR Interval:  98 QRS Duration: 102 QT Interval:  400 QTC Calculation: 416 R Axis:   20 Text Interpretation:  Sinus rhythm with short PR ST elevation consider inferolateral injury or acute infarct  ACUTE MI   Abnormal ECG Confirmed by Cuthbert Turton  MD, Braedyn Kauk ([id number]) on 03/24/2013 6:17:30 PM           CRITICAL CARE Performed by: Mervin Kung.  Total critical care time: 30 Critical care time was exclusive of separately billable procedures and treating other patients. Critical care was necessary to treat or prevent imminent or life-threatening deterioration. Critical care was time spent personally by me on the following activities: development of treatment plan with patient and/or surrogate as well as nursing, discussions with consultants, evaluation of patient's response to treatment, examination of patient, obtaining history from patient or surrogate, ordering and performing treatments and interventions, ordering and review of laboratory studies, ordering and review of radiographic studies, pulse oximetry and re-evaluation of patient's condition. MDM   1. STEMI (ST elevation myocardial infarction)    Based on EKG inferior MI. The patient prepared for cath lab discussed with the L [facility] cardiology. Patient had heparin started code STEMI protocol initiated patient to cath lab. Discussed briefly with family members.     Mervin Kung, MD 03/24/13 [phone]

## 2013-03-25 ENCOUNTER — Encounter (HOSPITAL_COMMUNITY)
Admission: EM | Disposition: A | Discharge status: Home / Self Care | Payer: Self-pay | Source: Home / Self Care | Attending: Interventional Cardiology

## 2013-03-25 ENCOUNTER — Other Ambulatory Visit: Payer: Self-pay

## 2013-03-25 ENCOUNTER — Encounter (HOSPITAL_COMMUNITY): Payer: Self-pay | Admitting: Interventional Cardiology

## 2013-03-25 DIAGNOSIS — I059 Rheumatic mitral valve disease, unspecified: Secondary | ICD-10-CM | POA: Diagnosis not present

## 2013-03-25 DIAGNOSIS — I472 Ventricular tachycardia, unspecified: Secondary | ICD-10-CM | POA: Diagnosis present

## 2013-03-25 DIAGNOSIS — E039 Hypothyroidism, unspecified: Secondary | ICD-10-CM | POA: Diagnosis not present

## 2013-03-25 DIAGNOSIS — I2119 ST elevation (STEMI) myocardial infarction involving other coronary artery of inferior wall: Secondary | ICD-10-CM | POA: Diagnosis not present

## 2013-03-25 DIAGNOSIS — I2510 Atherosclerotic heart disease of native coronary artery without angina pectoris: Secondary | ICD-10-CM

## 2013-03-25 DIAGNOSIS — E669 Obesity, unspecified: Secondary | ICD-10-CM | POA: Diagnosis not present

## 2013-03-25 HISTORY — PX: LEFT HEART CATHETERIZATION WITH CORONARY ANGIOGRAM: SHX5451

## 2013-03-25 LAB — COMPREHENSIVE METABOLIC PANEL
ALK PHOS: 89 U/L (ref 39–117)
ALT: 37 U/L — AB (ref 0–35)
AST: 221 U/L — ABNORMAL HIGH (ref 0–37)
Albumin: 3.3 g/dL — ABNORMAL LOW (ref 3.5–5.2)
BUN: 10 mg/dL (ref 6–23)
CALCIUM: 8.9 mg/dL (ref 8.4–10.5)
CO2: 19 meq/L (ref 19–32)
Chloride: 105 mEq/L (ref 96–112)
Creatinine, Ser: 0.62 mg/dL (ref 0.50–1.10)
GFR calc non Af Amer: 89 mL/min — ABNORMAL LOW (ref >90)
GLUCOSE: 146 mg/dL — AB (ref 70–99)
Potassium: 4.2 mEq/L (ref 3.7–5.3)
SODIUM: 139 meq/L (ref 137–147)
Total Bilirubin: 0.3 mg/dL (ref 0.3–1.2)
Total Protein: 7.1 g/dL (ref 6.0–8.3)

## 2013-03-25 LAB — CK TOTAL AND CKMB (NOT AT ARMC)
CK TOTAL: 2227 U/L — AB (ref 7–177)
CK, MB: 432.8 ng/mL — AB (ref 0.3–4.0)
CK, MB: 367.9 ng/mL (ref 0.3–4.0)
RELATIVE INDEX: 15.9 — AB (ref 0.0–2.5)
RELATIVE INDEX: 16.5 — AB (ref 0.0–2.5)
Total CK: 2720 U/L — ABNORMAL HIGH (ref 7–177)

## 2013-03-25 LAB — LIPID PANEL
Cholesterol: 165 mg/dL (ref 0–200)
HDL: 40 mg/dL (ref >39)
LDL Cholesterol: 117 mg/dL — ABNORMAL HIGH (ref 0–99)
Total CHOL/HDL Ratio: 4.1 RATIO
Triglycerides: 40 mg/dL (ref <150)
VLDL: 8 mg/dL (ref 0–40)

## 2013-03-25 LAB — CBC
HEMATOCRIT: 41.3 % (ref 36.0–46.0)
Hemoglobin: 14 g/dL (ref 12.0–15.0)
MCH: 29 pg (ref 26.0–34.0)
MCHC: 33.9 g/dL (ref 30.0–36.0)
MCV: 85.5 fL (ref 78.0–100.0)
Platelets: 229 10*3/uL (ref 150–400)
RBC: 4.83 MIL/uL (ref 3.87–5.11)
RDW: 13.4 % (ref 11.5–15.5)
WBC: 8.9 10*3/uL (ref 4.0–10.5)

## 2013-03-25 LAB — TROPONIN I
Troponin I: >20 ng/mL (ref <0.30)
Troponin I: >20 ng/mL (ref <0.30)

## 2013-03-25 LAB — TSH: TSH: 5.283 u[IU]/mL — ABNORMAL HIGH (ref 0.350–4.500)

## 2013-03-25 LAB — MAGNESIUM: MAGNESIUM: 2.2 mg/dL (ref 1.5–2.5)

## 2013-03-25 SURGERY — LEFT HEART CATHETERIZATION WITH CORONARY ANGIOGRAM
Anesthesia: LOCAL

## 2013-03-25 MED ORDER — LIDOCAINE HCL (PF) 1 % IJ SOLN
INTRAMUSCULAR | Status: AC
  Filled 2013-03-25: qty 30

## 2013-03-25 MED ORDER — HEPARIN SODIUM (PORCINE) 1000 UNIT/ML IJ SOLN
INTRAMUSCULAR | Status: AC
  Filled 2013-03-25: qty 1

## 2013-03-25 MED ORDER — MIDAZOLAM HCL 2 MG/2ML IJ SOLN
INTRAMUSCULAR | Status: AC
  Filled 2013-03-25: qty 2

## 2013-03-25 MED ORDER — FENTANYL CITRATE 0.05 MG/ML IJ SOLN
INTRAMUSCULAR | Status: AC
  Filled 2013-03-25: qty 2

## 2013-03-25 MED ORDER — METHYLPREDNISOLONE SODIUM SUCC 125 MG IJ SOLR
INTRAMUSCULAR | Status: AC
  Filled 2013-03-25: qty 2

## 2013-03-25 MED ORDER — SODIUM CHLORIDE 0.9 % IV SOLN
INTRAVENOUS | Status: AC
  Administered 2013-03-25: 11:00:00 via INTRAVENOUS

## 2013-03-25 MED ORDER — MORPHINE SULFATE 2 MG/ML IJ SOLN: 2.0000 mg | INTRAMUSCULAR | Status: DC | PRN

## 2013-03-25 MED ORDER — VERAPAMIL HCL 2.5 MG/ML IV SOLN
INTRAVENOUS | Status: AC
  Filled 2013-03-25: qty 2

## 2013-03-25 MED ORDER — NITROGLYCERIN 0.2 MG/ML ON CALL CATH LAB
INTRAVENOUS | Status: AC
  Filled 2013-03-25: qty 1

## 2013-03-25 MED ORDER — AMIODARONE LOAD VIA INFUSION
150.0000 mg | Freq: Once | INTRAVENOUS | Status: AC
  Administered 2013-03-25: 150 mg via INTRAVENOUS
  Filled 2013-03-25: qty 83.34

## 2013-03-25 MED ORDER — PERFLUTREN LIPID MICROSPHERE
1.0000 mL | INTRAVENOUS | Status: AC | PRN
  Administered 2013-03-25: 2 mL via INTRAVENOUS

## 2013-03-25 MED ORDER — NITROGLYCERIN IN D5W 200-5 MCG/ML-% IV SOLN
2.0000 ug/min | INTRAVENOUS | Status: DC
  Administered 2013-03-25: 3 ug/min via INTRAVENOUS
  Filled 2013-03-25: qty 250

## 2013-03-25 MED ORDER — POTASSIUM CHLORIDE CRYS ER 20 MEQ PO TBCR
20.0000 meq | EXTENDED_RELEASE_TABLET | Freq: Once | ORAL | Status: AC
  Administered 2013-03-25: 20 meq via ORAL
  Filled 2013-03-25: qty 1

## 2013-03-25 MED ORDER — AMIODARONE HCL IN DEXTROSE 360-4.14 MG/200ML-% IV SOLN
60.0000 mg/h | INTRAVENOUS | Status: AC
  Administered 2013-03-25 (×2): 60 mg/h via INTRAVENOUS
  Filled 2013-03-25 (×2): qty 200

## 2013-03-25 MED ORDER — HEPARIN (PORCINE) IN NACL 2-0.9 UNIT/ML-% IJ SOLN
INTRAMUSCULAR | Status: AC
  Filled 2013-03-25: qty 1500

## 2013-03-25 MED ORDER — PERFLUTREN LIPID MICROSPHERE
INTRAVENOUS | Status: AC
  Filled 2013-03-25: qty 10

## 2013-03-25 MED ORDER — AMIODARONE HCL IN DEXTROSE 360-4.14 MG/200ML-% IV SOLN
30.0000 mg/h | INTRAVENOUS | Status: DC
  Administered 2013-03-25: 30 mg/h via INTRAVENOUS
  Filled 2013-03-25 (×5): qty 200

## 2013-03-25 MED ORDER — WHITE PETROLATUM GEL
Status: AC
  Administered 2013-03-25: 1
  Filled 2013-03-25: qty 5

## 2013-03-25 NOTE — Progress Notes (Signed)
Pt.noted to have increased frequency of V-tach with the longest run lasting 30 beats. Pt. Now complaining of 4/10 chest pain. Post Cardiac Cath patient stated she was pain free. Assurant given with no relief. EKG performed. MD on call paged. Verbal orders given to start IV Nitro and Amiodarone.

## 2013-03-25 NOTE — Progress Notes (Signed)
CARDIAC REHAB PHASE I   PRE:  Rate/Rhythm: 58 SB    BP: sitting 119/47    SaO2: 98 RA  MODE:  Ambulation: 350 ft   POST:  Rate/Rhythm: 80 SR    BP: sitting 133/61     SaO2: 98 RA  Pt anxious to walk. Tolerated well, fairly steady. Sts she feels well, light SOB, no CP. Return to recliner. Pt is foggy on her days, admits to thinking she has been here since Sunday. Also sts she keeps thinking she is in [facility] but knows this is not accurate. Gave materials and will discuss MI tomorrow when mind clears. Will f/u. [phone]   [name] [place] CES, ACSM 03/25/2013 3:16 PM

## 2013-03-25 NOTE — Care Management Note (Signed)
    Page 1 of 1   03/25/2013     11:26:28 AM   CARE MANAGEMENT NOTE 03/25/2013  Patient:  [name], [name]   Account Number:  [id number]  Date Initiated:  03/25/2013  Documentation initiated by:  Elissa Hefty  Subjective/Objective Assessment:   adm w stemi     Action/Plan:   lives alone, pcp dr Deirdre Pippins   Anticipated DC Date:     Anticipated DC Plan:        DC Planning Services  CM consult  Medication Assistance      Choice offered to / List presented to:             Status of service:   Medicare Important Message given?   (If response is "NO", the following Medicare IM given date fields will be blank) Date Medicare IM given:   Date Additional Medicare IM given:    Discharge Disposition:    Per UR Regulation:  Reviewed for med. necessity/level of care/duration of stay  If discussed at Long Length of Stay Meetings, dates discussed:    Comments:  1/8 1125a debbie Xoey [place] rn,bsn gave pt 30day free brilinta card. pt states she does have medicare d plan to help cover meds.

## 2013-03-25 NOTE — Interval H&P Note (Signed)
History and Physical Interval Note:  03/25/2013 7:49 AM  [name]  has presented today for cardiac cath with the diagnosis of chest pain and VT post lateral STEMI. The various methods of treatment have been discussed with the patient and family. After consideration of risks, benefits and other options for treatment, the patient has consented to  Procedure(s): LEFT HEART CATHETERIZATION WITH CORONARY ANGIOGRAM (N/A) as a surgical intervention .  The patient's history has been reviewed, patient examined, no change in status, stable for surgery.  I have reviewed the patient's chart and labs.  Questions were answered to the patient's satisfaction.    Cath Lab Visit (complete for each Cath Lab visit)  Clinical Evaluation Leading to the Procedure:   ACS: yes  Non-ACS:    Anginal Classification: CCS IV  Anti-ischemic medical therapy: Maximal Therapy (2 or more classes of medications)  Non-Invasive Test Results: No non-invasive testing performed  Prior CABG: No previous CABG         [name]

## 2013-03-25 NOTE — CV Procedure (Signed)
      Cardiac Catheterization Operative Report  [name] [id number] 1/8/20158:10 AM ARONSON,RICHARD A, MD  Procedure Performed:  1. Left Heart Catheterization 2. Selective Coronary Angiography  Operator: Lauree Chandler, MD  Arterial access site:  Left radial artery.   Indication:  73 yo female with history of hypothyroidism admitted with acute inferoposterior STEMI 03/24/13, found to have occluded proximal to mid Circumflex artery. A bare metal stent was placed in the mid Circumflex. Over the last 12 hours, she has had continued chest pain with runs of non-sustained VT. Re-look cardiac cath this am to exclude stent thrombosis.                                      Procedure Details: The risks, benefits, complications, treatment options, and expected outcomes were discussed with the patient. The patient and/or family concurred with the proposed plan, giving informed consent. The patient was brought to the cath lab after IV hydration was begun and oral premedication was given. The patient was further sedated with Versed and Fentanyl. The left wrist was assessed with an Allens test which was positive. The left wrist was prepped and draped in a sterile fashion. 1% lidocaine was used for local anesthesia. Using the modified Seldinger access technique, a 5 French sheath was placed in the left radial artery. 3 mg Verapamil was given through the sheath. 5000 units IV heparin was given. Standard diagnostic catheters were used to perform selective coronary angiography. A pigtail catheter was used to measure left ventricular pressures. The sheath was removed from the left radial artery and a Terumo hemostasis band was applied at the arteriotomy site on the right wrist.   There were no immediate complications. The patient was taken to the recovery area in stable condition.   Hemodynamic Findings: Central aortic pressure: 111/59 Left ventricular pressure: 110/12/19  Angiographic  Findings:  Left main: Normal caliber, short segment. No obstructive disease.   Left Anterior Descending Artery: Large caliber vessel that courses to the apex. There are mild luminal irregularities in the mid vessel. There are several small to medium caliber diagonal vessels without significant disease.   Circumflex Artery: Large caliber vessel with medium caliber obtuse marginal branch. The proximal stent is patent without restenosis, thrombus or appearance of disruption of vessel pre or post stent.    Right Coronary Artery: Large dominant vessel with mild proximal disease, 30% mid stenosis. The PDA and posterolateral branches are small in caliber.   Left Ventricular Angiogram: Deferred.   Impression: 1. Single vessel CAD with patent stent proximal Circumflex.  2. Chest pain and VT are likely the result of recent infarction.  Recommendations: Will continue to monitor in CCU. Will continue current therapy.        Complications:  None. The patient tolerated the procedure well.

## 2013-03-25 NOTE — Progress Notes (Signed)
Echocardiogram 2D Echocardiogram with Definity has been performed.  [name] 03/25/2013, 11:16 AM

## 2013-03-25 NOTE — Progress Notes (Signed)
Pt asleep in chair upon awaking pt states that she is confused about the time she has been in the hospital. Pt has had sedation today and yesterday when in cath lab. Pt reorients quickly. States I just forgot.

## 2013-03-25 NOTE — Significant Event (Signed)
   Patient developed 4/10 chest tightness at around 12 AM that was similar to her presenting symptom, but not as intense. She was also noted to have runs of nonsustained VTach, up to 30 beats.   RRR,No wheezing,no wrist hematoma. Multiple ECGs performed. No ST segment changes noted.  Intermittent RBBB noted.  NSVT noted as well.  Patient feels palpitations with the NSVT.  No syncope.   Chest discomfort: Prior to stent, pain was 8/10.  Sx now are not as bad. IV NTG started.  No ECG changes.Doubt acute stent thrombosis.  May be pain from stretching of vessel or microvascular obstruction as there was significant thrombus burden.  Will not plan on repeat cath now. If ECG changes or sx worsen, would reconsider repeat cath.  Add on Ck, CK-MBs to see trend.    NSVT: Start IV Amiodarone. VT Likely from MI.  Watch in unit today.  Likely will not be able to go home Friday as initially planned.   Arrhythmia will hopefully quiet down over the next 24 hours.    Critical care time 60 minutes  Alydia Gosser S.

## 2013-03-26 DIAGNOSIS — I2119 ST elevation (STEMI) myocardial infarction involving other coronary artery of inferior wall: Secondary | ICD-10-CM | POA: Diagnosis not present

## 2013-03-26 DIAGNOSIS — E785 Hyperlipidemia, unspecified: Secondary | ICD-10-CM

## 2013-03-26 DIAGNOSIS — I4729 Other ventricular tachycardia: Secondary | ICD-10-CM

## 2013-03-26 DIAGNOSIS — E039 Hypothyroidism, unspecified: Secondary | ICD-10-CM | POA: Diagnosis not present

## 2013-03-26 DIAGNOSIS — E669 Obesity, unspecified: Secondary | ICD-10-CM | POA: Diagnosis not present

## 2013-03-26 DIAGNOSIS — I219 Acute myocardial infarction, unspecified: Secondary | ICD-10-CM | POA: Diagnosis not present

## 2013-03-26 DIAGNOSIS — I10 Essential (primary) hypertension: Secondary | ICD-10-CM | POA: Diagnosis not present

## 2013-03-26 DIAGNOSIS — I472 Ventricular tachycardia: Secondary | ICD-10-CM | POA: Diagnosis not present

## 2013-03-26 MED ORDER — LEVOTHYROXINE SODIUM 100 MCG PO TABS
100.0000 ug | ORAL_TABLET | Freq: Every day | ORAL | Status: DC
  Administered 2013-03-27 – 2013-03-28 (×2): 100 ug via ORAL
  Filled 2013-03-26 (×5): qty 1

## 2013-03-26 MED ORDER — LOSARTAN POTASSIUM 50 MG PO TABS
50.0000 mg | ORAL_TABLET | Freq: Every day | ORAL | Status: DC
  Administered 2013-03-26 – 2013-03-27 (×2): 50 mg via ORAL
  Filled 2013-03-26 (×3): qty 1

## 2013-03-26 MED ORDER — CARVEDILOL 3.125 MG PO TABS
3.1250 mg | ORAL_TABLET | Freq: Two times a day (BID) | ORAL | Status: DC
  Filled 2013-03-26 (×2): qty 1

## 2013-03-26 NOTE — Progress Notes (Addendum)
PROGRESS NOTE  Subjective:   [name] is a 73 yo with acute MI , s/p stenting of LCx with a 3.0 x 12 vision stent.  Post dilated with a 3.5 balloon.    Noted to have normal LV function with EF of 55%.    She has had bradycardia and metoprolol has been held.  She had some episodes of NS VT and was on amiodarone for ~24 hours.  No recent arrhythmias.    Objective:    Vital Signs:   Temp:  [97.5 F (36.4 C)-98.6 F (37 C)] 97.5 F (36.4 C) (01/09 0400) Pulse Rate:  [45-64] 50 (01/09 0500) Resp:  [13-22] 14 (01/09 0500) BP: (97-138)/(38-73) 120/58 mmHg (01/09 0500) SpO2:  [95 %-100 %] 95 % (01/09 0500) Weight:  [242 lb 8.1 oz (110 kg)] 242 lb 8.1 oz (110 kg) (01/09 0424)  Last BM Date: 03/23/13   24-hour weight change: Weight change: 1 lb 5.2 oz (0.6 kg)  Weight trends: Filed Weights   03/24/13 2100 03/25/13 0445 03/26/13 0424  Weight: 241 lb 2.9 oz (109.4 kg) 239 lb 10.2 oz (108.7 kg) 242 lb 8.1 oz (110 kg)    Intake/Output:  01/08 0701 - 01/09 0700 In: 587.9 [I.V.:587.9] Out: 1400 [Urine:1400]     Physical Exam: BP 120/58  Pulse 50  Temp(Src) 97.5 F (36.4 C) (Oral)  Resp 14  Ht 5\' 6"  (1.676 m)  Wt 242 lb 8.1 oz (110 kg)  BMI 39.16 kg/m2  SpO2 95%  General: Vital signs reviewed and noted.   Head: Normocephalic, atraumatic.  Eyes: conjunctivae/corneas clear.  EOM's intact.   Throat: normal  Neck:  normal  Lungs:  clear  Heart:  RR, normal S1, S  Abdomen:  Soft, non-tender, non-distended    Extremities: Moderate bruising of right radial cath site.  Good pulses, fingers are warm.    Neurologic: A&O X3, CN II - XII are grossly intact.   Psych: Normal     Labs: BMET:  Recent Labs  03/24/13 1820 03/25/13 0405  NA 141 139  K 3.4* 4.2  CL 102 105  CO2 24 19  GLUCOSE 150* 146*  BUN 12 10  CREATININE 0.83 0.62  CALCIUM 9.2 8.9  MG  --  2.2    Liver function tests:  Recent Labs  03/24/13 1820 03/25/13 0405  AST 23 221*  ALT 17 37*    ALKPHOS 98 89  BILITOT 0.2* 0.3  PROT 7.6 7.1  ALBUMIN 3.8 3.3*   No results found for this basename: LIPASE, AMYLASE,  in the last 72 hours  CBC:  Recent Labs  03/24/13 1820 03/25/13 0405  WBC 8.9 8.9  HGB 14.8 14.0  HCT 43.3 41.3  MCV 86.3 85.5  PLT 226 229    Cardiac Enzymes:  Recent Labs  03/24/13 2120 03/25/13 0405 03/25/13 0743  CKTOTAL  --  2720* 2227*  CKMB  --  432.8* 367.9*  TROPONINI 15.30* >20.00* >20.00*    Coagulation Studies:  Recent Labs  03/24/13 1820  LABPROT 14.1  INR 1.11    Other: No components found with this basename: POCBNP,  No results found for this basename: DDIMER,  in the last 72 hours No results found for this basename: HGBA1C,  in the last 72 hours  Recent Labs  03/25/13 0405  CHOL 165  HDL 40  LDLCALC 117*  TRIG 40  CHOLHDL 4.1    Recent Labs  03/24/13 2120  TSH 5.283*  No results found for this basename: VITAMINB12, FOLATE, FERRITIN, TIBC, IRON, RETICCTPCT,  in the last 72 hours   Other results:  EKG , Mar 26, 2013:    Sinus brady at 59 , NS T wave abnormality  Medications:    Infusions: . amiodarone (NEXTERONE PREMIX) 360 mg/200 mL dextrose Stopped (03/26/13 0300)  . nitroGLYCERIN 5 mcg/min (03/25/13 1200)    Scheduled Medications: . aspirin  81 mg Oral Daily  . atorvastatin  80 mg Oral q1800  . levothyroxine  88 mcg Oral Q breakfast  . metoprolol tartrate  12.5 mg Oral BID  . metroNIDAZOLE  1 application Topical Daily  . pantoprazole  40 mg Oral Daily  . sodium chloride  3 mL Intravenous Q12H  . Ticagrelor  90 mg Oral BID    Assessment/ Plan:     1. CAD:  S/p inferioposterior MI.  BMS to the LCx.  She had some non-sustained VT and she had a re-look cath yesterday am.  Stent was found to be patent.  She has had some persistent bradycardia but otherwise is doing well.   Continue Brilinta 90 mg twice a day. Continue aspirin 81 mg a day  Has ambulated with rehab once Will transfer to  tele. Will try to start low dose of coreg - titrate up as tolerated.    2. Nonsustained Ventricular tachycardia:   Received IV amiodarone for approximately 24 hours. She's remained very stable and has not had any further arrhythmias. I suspect this is just to do to her myocardial infarction. She has normal left ventricular systolic function. There is no evidence that she'll need long-term antiarrhythmics at this point.  3. Hyperlipidemia:   cholesterol profile reveals an LDL cholesterol of 117.  Continue atorvastatin 80 mg a day  4. Hypothyroidism- her synthroid was just recently increased to 100 mcg from 88 mcg.  Her TSH is 5.283. This will be followed by her medical doctor.  5. Hypertension:  Will restart Losartan 50 .  Transfer to tele.  Home this weekend.  ? Sunday if she continues to do well.  Disposition:  Length of Stay: 2  Ramond Dial., MD, [facility] 03/26/2013, 8:19 AM Office [phone] Pager [phone]

## 2013-03-26 NOTE — Progress Notes (Signed)
Ms. Sundquist has been enrolled in the Med-to-Bed Pilot Program for STEMI patients.  This program is designed to help patients transition out of the hospital by ensuring that patients have the opportunity to obtain medications and education on each medication.  The medications will be processed through the patient's insurance and delivered to the bedside prior to discharge.    The medications included are P2Y12 inhibitors, statins, beta-blockers, and aspirin.    When planning for discharge, please send prescriptions for the above medications to the [facility] (will be added to the patient's pharmacy list).    Please feel free to page me with questions or concerns!  Thank you, Vivia Ewing, PharmD Clinical Pharmacist - Resident Pager: [phone] Pharmacy: [phone] 03/26/2013 4:20 PM

## 2013-03-26 NOTE — Progress Notes (Signed)
CARDIAC REHAB PHASE I   PRE:  Rate/Rhythm: 80 SR  BP:  Supine:   Sitting: 126/62  Standing:    SaO2:   MODE:  Ambulation: 640 ft   POST:  Rate/Rhythm: 89 SR  BP:  Supine:   Sitting: 153/70  Standing:    SaO2: 100 RA 1100-1225  Pt tolerated ambulation well. She did c/o of SOB after walk, RA sat 100%. Gait steady.VS stable , no c/o of cp. Completed MI and stent education with pt. She voices understanding. Pt agrees to [place]. CRP in [place], will send referral.  Rodney Langton RN 03/26/2013 12:23 PM

## 2013-03-27 DIAGNOSIS — K219 Gastro-esophageal reflux disease without esophagitis: Secondary | ICD-10-CM

## 2013-03-27 DIAGNOSIS — E039 Hypothyroidism, unspecified: Secondary | ICD-10-CM | POA: Diagnosis not present

## 2013-03-27 DIAGNOSIS — I472 Ventricular tachycardia: Secondary | ICD-10-CM | POA: Diagnosis not present

## 2013-03-27 DIAGNOSIS — G4730 Sleep apnea, unspecified: Secondary | ICD-10-CM

## 2013-03-27 DIAGNOSIS — Z9861 Coronary angioplasty status: Secondary | ICD-10-CM

## 2013-03-27 DIAGNOSIS — I2119 ST elevation (STEMI) myocardial infarction involving other coronary artery of inferior wall: Secondary | ICD-10-CM

## 2013-03-27 DIAGNOSIS — E669 Obesity, unspecified: Secondary | ICD-10-CM | POA: Diagnosis not present

## 2013-03-27 MED ORDER — PANTOPRAZOLE SODIUM 40 MG PO TBEC
40.0000 mg | DELAYED_RELEASE_TABLET | Freq: Two times a day (BID) | ORAL | Status: DC
  Administered 2013-03-27: 40 mg via ORAL
  Filled 2013-03-27: qty 1

## 2013-03-27 NOTE — Progress Notes (Signed)
Patient Name: [name] Date of Encounter: 03/27/2013  Active Problems:   Acute myocardial infarction of other inferior wall, initial episode of care   Acute inferolateral myocardial infarction    SUBJECTIVE: C/o some chest heaviness during the night, never had before. Has had increased WOB during the night as well at times. Has had night sweats/feeling hot since Synthroid dose was increased several months ago. No chest pain. Sleeps w/ head elevated due to reflux symptoms.   Eager to work on being healthier.  OBJECTIVE Filed Vitals:   03/26/13 1344 03/26/13 1741 03/26/13 2152 03/27/13 0500  BP: 120/51 122/55 114/52 136/74  Pulse: 56 69 66 72  Temp: 98.4 F (36.9 C)  97.4 F (36.3 C) 98.1 F (36.7 C)  TempSrc: Oral     Resp: 18  18 18   Height:      Weight:    244 lb 14.4 oz (111.086 kg)  SpO2: 100%  97% 94%    Intake/Output Summary (Last 24 hours) at 03/27/13 1000 Last data filed at 03/27/13 0900  Gross per 24 hour  Intake    360 ml  Output      0 ml  Net    360 ml   Filed Weights   03/25/13 0445 03/26/13 0424 03/27/13 0500  Weight: 239 lb 10.2 oz (108.7 kg) 242 lb 8.1 oz (110 kg) 244 lb 14.4 oz (111.086 kg)    PHYSICAL EXAM General: Well developed, well nourished, female in no acute distress. Head: Normocephalic, atraumatic.  Neck: Supple without bruits, JVD not elevated. Lungs:  Resp regular and unlabored, CTA. Heart: RRR, S1, S2, no S3, S4, or murmur; no rub. Abdomen: Soft, non-tender, non-distended, BS + x 4.  Extremities: No clubbing, cyanosis, no edema.  Neuro: Alert and oriented X 3. Moves all extremities spontaneously. Psych: Normal affect.  LABS: CBC: Recent Labs  03/24/13 1820 03/25/13 0405  WBC 8.9 8.9  HGB 14.8 14.0  HCT 43.3 41.3  MCV 86.3 85.5  PLT 226 229   INR: Recent Labs  03/24/13 1820  INR 1.74   Basic Metabolic Panel: Recent Labs  03/24/13 1820 03/25/13 0405  NA 141 139  K 3.4* 4.2  CL 102 105  CO2 24 19    GLUCOSE 150* 146*  BUN 12 10  CREATININE 0.83 0.62  CALCIUM 9.2 8.9  MG  --  2.2   Liver Function Tests: Recent Labs  03/24/13 1820 03/25/13 0405  AST 23 221*  ALT 17 37*  ALKPHOS 98 89  BILITOT 0.2* 0.3  PROT 7.6 7.1  ALBUMIN 3.8 3.3*   Cardiac Enzymes: Recent Labs  03/24/13 2120 03/25/13 0405 03/25/13 0743  CKTOTAL  --  2720* 2227*  CKMB  --  432.8* 367.9*  TROPONINI 15.30* >20.00* >20.00*    Recent Labs  03/24/13 1853  TROPIPOC 0.45*   Fasting Lipid Panel: Recent Labs  03/25/13 0405  CHOL 165  HDL 40  LDLCALC 117*  TRIG 40  CHOLHDL 4.1   Thyroid Function Tests: Recent Labs  03/24/13 2120  TSH 5.283*   No results found for this basename: HGBA1C    TELE:  SR      Radiology/Studies: None. CXR at physical in November was OK.  Current Medications:  . aspirin  81 mg Oral Daily  . atorvastatin  80 mg Oral q1800  . levothyroxine  100 mcg Oral QAC breakfast  . losartan  50 mg Oral Daily  . metoprolol tartrate  12.5 mg Oral  BID  . metroNIDAZOLE  1 application Topical Daily  . pantoprazole  40 mg Oral Daily  . sodium chloride  3 mL Intravenous Q12H  . Ticagrelor  90 mg Oral BID   . nitroGLYCERIN 5 mcg/min (03/25/13 1200)    ASSESSMENT AND PLAN: Active Problems:   Acute myocardial infarction of other inferior wall, initial episode of care   Acute inferolateral myocardial infarction    Obesity - Encourage Heart-healthy diet    Hyperglycemia - Check A1c, f/u with primary MD.    Hyperlipidemia - cholesterol profile reveals an LDL cholesterol of 117. Continue atorvastatin 80 mg a day    NSVT - Received IV amiodarone for approximately 24 hours. She's remained very stable and has not had any further arrhythmias. I suspect this is just to do to her myocardial infarction. She has normal left ventricular systolic function. There is no evidence that she'll need long-term antiarrhythmics at this point. Has sinus bradycardia w/ HR occasionally dropping  into the 40s, not sustained. Lowest SBP noted 97      Hypothyroidism - her synthroid was just recently increased to 100 mcg from 88 mcg. Her TSH is 5.283. This will be followed by her medical doctor     Chest heaviness - recheck cardiac enzymes, increase PPI to BID, overnight oximetry to assess for OHS or OSA.   Plan - continue to ambulate, possible d/c in am if OK.   Jonetta Speak , PA-C 10:00 AM 03/27/2013

## 2013-03-27 NOTE — Progress Notes (Signed)
The patient was seen and examined, and I agree with the assessment and plan as documented above, with modifications as noted below. Pt denies chest pain and only real concern for her has been increased work of breathing particularly at night, whether lying in bed or sleeping in her hospital room chair. She has not had a sleep study, which I would recommend pursuing as an outpatient. Echo was technically difficult and LV gram was not performed. Continue metoprolol, Lipitor, ASA, Losartan, and Brilinta. Likely d/c to home on 03/28/13. Will need cardiac rehab as outpatient.

## 2013-03-28 ENCOUNTER — Encounter (HOSPITAL_COMMUNITY): Payer: Self-pay | Admitting: Nurse Practitioner

## 2013-03-28 DIAGNOSIS — I2119 ST elevation (STEMI) myocardial infarction involving other coronary artery of inferior wall: Secondary | ICD-10-CM

## 2013-03-28 DIAGNOSIS — R0609 Other forms of dyspnea: Secondary | ICD-10-CM

## 2013-03-28 DIAGNOSIS — E039 Hypothyroidism, unspecified: Secondary | ICD-10-CM

## 2013-03-28 DIAGNOSIS — E785 Hyperlipidemia, unspecified: Secondary | ICD-10-CM

## 2013-03-28 DIAGNOSIS — R0989 Other specified symptoms and signs involving the circulatory and respiratory systems: Secondary | ICD-10-CM

## 2013-03-28 DIAGNOSIS — E669 Obesity, unspecified: Secondary | ICD-10-CM

## 2013-03-28 DIAGNOSIS — L719 Rosacea, unspecified: Secondary | ICD-10-CM | POA: Diagnosis present

## 2013-03-28 DIAGNOSIS — I2510 Atherosclerotic heart disease of native coronary artery without angina pectoris: Secondary | ICD-10-CM

## 2013-03-28 DIAGNOSIS — T7840XA Allergy, unspecified, initial encounter: Secondary | ICD-10-CM

## 2013-03-28 HISTORY — DX: ST elevation (STEMI) myocardial infarction involving other coronary artery of inferior wall: I21.19

## 2013-03-28 LAB — BASIC METABOLIC PANEL
BUN: 18 mg/dL (ref 6–23)
CO2: 26 mEq/L (ref 19–32)
CREATININE: 0.88 mg/dL (ref 0.50–1.10)
Calcium: 8.6 mg/dL (ref 8.4–10.5)
Chloride: 102 mEq/L (ref 96–112)
GFR, EST AFRICAN AMERICAN: 75 mL/min — AB (ref >90)
GFR, EST NON AFRICAN AMERICAN: 65 mL/min — AB (ref >90)
Glucose, Bld: 110 mg/dL — ABNORMAL HIGH (ref 70–99)
Potassium: 4.1 mEq/L (ref 3.7–5.3)
Sodium: 139 mEq/L (ref 137–147)

## 2013-03-28 LAB — HEMOGLOBIN A1C
Hgb A1c MFr Bld: 5.9 % — ABNORMAL HIGH (ref <5.7)
Mean Plasma Glucose: 123 mg/dL — ABNORMAL HIGH (ref <117)

## 2013-03-28 MED ORDER — TICAGRELOR 90 MG PO TABS: 90.0000 mg | ORAL_TABLET | Freq: Two times a day (BID) | ORAL | Status: DC

## 2013-03-28 MED ORDER — NITROGLYCERIN 0.4 MG SL SUBL: 0.4000 mg | SUBLINGUAL_TABLET | SUBLINGUAL | Status: DC | PRN

## 2013-03-28 MED ORDER — ATORVASTATIN CALCIUM 80 MG PO TABS: 80.0000 mg | ORAL_TABLET | Freq: Every day | ORAL | Status: DC

## 2013-03-28 MED ORDER — METOPROLOL SUCCINATE ER 25 MG PO TB24: 25.0000 mg | ORAL_TABLET | Freq: Every day | ORAL | Status: DC

## 2013-03-28 NOTE — Discharge Instructions (Signed)
**  PLEASE REMEMBER TO BRING ALL OF YOUR MEDICATIONS TO EACH OF YOUR FOLLOW-UP OFFICE VISITS. ° °NO HEAVY LIFTING X 4 WEEKS. °NO SEXUAL ACTIVITY X 4 WEEKS. °NO DRIVING X 2 WEEKS. °NO SOAKING BATHS, HOT TUBS, POOLS, ETC., X 7 DAYS. ° °Radial Site Care °Refer to this sheet in the next few weeks. These instructions provide you with information on caring for yourself after your procedure. Your caregiver may also give you more specific instructions. Your treatment has been planned according to current medical practices, but problems sometimes occur. Call your caregiver if you have any problems or questions after your procedure. °HOME CARE INSTRUCTIONS °· You may shower the day after the procedure. Remove the bandage (dressing) and gently wash the site with plain soap and water. Gently pat the site dry.  °· Do not apply powder or lotion to the site.  °· Do not submerge the affected site in water for 3 to 5 days.  °· Inspect the site at least twice daily.  °· Do not flex or bend the affected arm for 24 hours.  °· No lifting over 5 pounds (2.3 kg) for 5 days after your procedure.  ° °What to expect: °· Any bruising will usually fade within 1 to 2 weeks.  °· Blood that collects in the tissue (hematoma) may be painful to the touch. It should usually decrease in size and tenderness within 1 to 2 weeks.  °SEEK IMMEDIATE MEDICAL CARE IF: °· You have unusual pain at the radial site.  °· You have redness, warmth, swelling, or pain at the radial site.  °· You have drainage (other than a small amount of blood on the dressing).  °· You have chills.  °· You have a fever or persistent symptoms for more than 72 hours.  °· You have a fever and your symptoms suddenly get worse.  °· Your arm becomes pale, cool, tingly, or numb.  °· You have heavy bleeding from the site. Hold pressure on the site.  ° °

## 2013-03-28 NOTE — Progress Notes (Signed)
Patient Name: [name] Date of Encounter: 03/28/2013   Principal Problem:   Acute MI, inferoposterior wall, initial episode of care Active Problems:   CAD (coronary artery disease)   Essential hypertension, benign   Ventricular tachycardia   Obesity   GERD   Hypothyroidism   Hyperlipidemia   IV Contrast Allergy   Rosacea   SUBJECTIVE  No chest pain. She describes a vague sensation of dyspnea at times while lying back in bed - though it's less pronounced this AM.  She had doe but this improved throughout the day yesterday.  She is eager to go home.  CURRENT MEDS . aspirin  81 mg Oral Daily  . atorvastatin  80 mg Oral q1800  . levothyroxine  100 mcg Oral QAC breakfast  . losartan  50 mg Oral Daily  . metoprolol tartrate  12.5 mg Oral BID  . metroNIDAZOLE  1 application Topical Daily  . pantoprazole  40 mg Oral BID  . sodium chloride  3 mL Intravenous Q12H  . Ticagrelor  90 mg Oral BID    OBJECTIVE  Filed Vitals:   03/26/13 2152 03/27/13 0500 03/27/13 2131 03/28/13 0542  BP: 114/52 136/74 116/59 104/52  Pulse: 66 72 65 56  Temp: 97.4 F (36.3 C) 98.1 F (36.7 C) 98.6 F (37 C) 98.3 F (36.8 C)  TempSrc:   Oral Oral  Resp: 18 18 18 18   Height:      Weight:  244 lb 14.4 oz (111.086 kg)  240 lb 1.6 oz (108.909 kg)  SpO2: 97% 94% 96% 95%    Intake/Output Summary (Last 24 hours) at 03/28/13 0730 Last data filed at 03/27/13 0900  Gross per 24 hour  Intake    360 ml  Output      0 ml  Net    360 ml   Filed Weights   03/26/13 0424 03/27/13 0500 03/28/13 0542  Weight: 242 lb 8.1 oz (110 kg) 244 lb 14.4 oz (111.086 kg) 240 lb 1.6 oz (108.909 kg)    PHYSICAL EXAM  General: Pleasant, NAD. Neuro: Alert and oriented X 3. Moves all extremities spontaneously. Psych: Normal affect. HEENT:  Normal  Neck: Supple without bruits or JVD. Lungs:  Resp regular and unlabored, CTA. Heart: RRR no s3, s4, or murmurs. Abdomen: Soft, non-tender, non-distended, BS + x  4.  Extremities: No clubbing, cyanosis or edema. DP/PT/Radials 2+ and equal bilaterally.  R wrist cath site is ecchymotic w/o bleeding, bruit, or hematoma.  Accessory Clinical Findings  Lab Results  Component Value Date   WBC 8.9 03/25/2013   HGB 14.0 03/25/2013   HCT 41.3 03/25/2013   MCV 85.5 03/25/2013   PLT 229 06/19/345    Basic Metabolic Panel  Recent Labs  03/28/13 0315  NA 139  K 4.1  CL 102  CO2 26  GLUCOSE 110*  BUN 18  CREATININE 0.88  CALCIUM 8.6   Cardiac Enzymes  Recent Labs  03/25/13 0743  CKTOTAL 2227*  CKMB 367.9*  TROPONINI >20.00*   Lab Results  Component Value Date   TSH 5.283* 03/24/2013    TELE  Rsr, pac's.  ECG  Rsr, 78, no acute st/t changes.  Radiology/Studies  No results found.  ASSESSMENT AND PLAN  1. Acute inferoposterior STEMI/CAD:  S/p PCI/BMS to the LCX.  Otw nonobs dzs, EF 55% by LV gram.  Relook cath showed patent stent.  No chest pain.  Dyspnea improving.  ? Role that Brilinta might be playing in dyspnea.  Will have to follow over the next week and see how she is doing at transition of care appt in 7 days.  Cont asa, statin, bb, arb.  She plans to enroll in cardiac rehab here @ [facility].  2.  HTN:  Stable.  Cont bb/arb.  3.  HL:  LDL 117.  Cont high potency statin in setting of #1.  Follow-up lipids/lft's in 6 wks.  4.  Hypothyroidism:  Cont synthroid.  TSH mildly elevated - suspect sick euthyroid.  5.  GERD: cont PPI.  6.  Vaginal bleeding/spotting:  None during admission.  Began prior to admission.  She has f/u with GYN.  H/H stable as of 1/8.  BMS placed in setting of MI.  Minimum of 30 days of DAPT.  7.  Obesity:  She has enrolled in outpt cardiac rehab.  8.  ? OSA:  She reports occasional sudden awakening with dyspnea.  She would benefit from outpt sleep eval.  Signed, Murray Hodgkins NP

## 2013-03-28 NOTE — Progress Notes (Signed)
Pt provided with dc instructions and edcuation. Pt verbalized understanding and has no questions at this time. IV removed with tip itnact. Heart monitor cleaned and returned to front. Dorna Bloom, RN

## 2013-03-28 NOTE — Progress Notes (Signed)
The patient was seen and examined, and I agree with the assessment and plan as documented above. Pt is symptomatically stable and ready to go home. Agree that Brilinta may be playing a role with regards to dyspnea.  Will f/u in cardiology within next 1-2 weeks.

## 2013-03-28 NOTE — Discharge Summary (Signed)
Discharge Summary   Patient ID: [name],  MRN: [id number], DOB/AGE: 1940-04-14 73 y.o.  Admit date: 03/24/2013 Discharge date: 03/28/2013  Primary Care Provider: ARONSON,RICHARD A Primary Cardiologist: Lendell Caprice, MD   Discharge Diagnoses Principal Problem:   Acute MI, inferoposterior wall, initial episode of care  **Status post PCI and bare metal stenting of the Left Circumflex this admission. Active Problems:   CAD (coronary artery disease)   Essential hypertension, benign   Ventricular tachycardia   Obesity   GERD   Hypothyroidism   Hyperlipidemia  **LDL 117, high potency statin initiated.   IV Contrast Allergy   Rosacea  Allergies Allergies  Allergen Reactions  . Iohexol Hives   Procedures  Cardiac Catheterization and Percutaneous Coronary Intervention 1.8.2015  Study Conclusions  - Left ventricle: Poor acoustic windows LV is not seen well   enough to evaluate function. The cavity size was normal.   Wall thickness was increased in a pattern of mild LVH.   Doppler parameters are consistent with abnormal left   ventricular relaxation (grade 1 diastolic dysfunction). - Mitral valve: Mild regurgitation. - Pulmonary arteries: PA peak pressure: 66mm Hg (S). _____________   Cardiac Catheterization and Percutaneous Coronary Intervention 1.7.2015  IMPRESSIONS:    1. Normal left main coronary artery. 2. Widely patent left anterior descending artery and its branches. 3. Occluded proximal left circumflex artery which was successfully stented with a 3.0 x 12 bare-metal vision stent.  The stent was post dilated to greater than  3.5 mm in diameter. 4. Mild disease in the large, dominant right coronary artery. 5. Normal left ventricular systolic function.  LVEDP 26 mmHg.  Ejection fraction 55%. _____________   History of Present Illness  73 y/o female with a prior history of hypertension, obesity, and hypothyroidism.  She was in her usual state of health until  the afternoon of January 7th, when she experienced a sudden onset of severe substernal chest pressure associated with mild dyspnea.  Symptoms persisted despite taking aspirin.  After over an hour of ongoing symptoms, she presented to the [facility] ED, where ECG showed inferior and lateral ST elevation with lateral ST depression.  A code STEMI was activated and the patient was taken emergently to the catheterization laboratory.  Hospital Course  [name] underwent emergent diagnostic catheterization revealing a totally occluded Left Circumflex with otherwise minimal non-obstructive CAD and normal LV function.  The Left Circumflex was successfully treated using a 3.0 x 39mm vision bare-metal stent.  A bare-metal stent was chosen because prior to catheterization, [name] reported some recent vaginal bleeding/spotting, and the vessel was reasonably large in diameter with a short occlusion.  Post-catheterization, [name] was monitored in the coronary intensive care unit.  There, she ruled in for MI, eventually peaking her troponin at > 20.  In the early morning hours of 1/8, she was noted to have runs of sustained VT of up to 30 beats, and also complained of chest pain.  ECG did not show and new ST changes.  IV amiodarone was initiated without further VT.  She did continue to c/o chest pain however and therefor, decision was made to perform relook catheterization on the morning of 1/8.  This showed patency of prior placed Left Circumflex stent.  Continued medical therapy was recommended.  Patient was feeling better by 1/9.  She was transferred out to the floor and tolerated ambulation well.  On the evening of 1/9 however, she awoke at times with dyspnea.  ECG remained unchanged.  It was felt that these symptoms may represent undiagnosed obstructive sleep apnea and outpatient pulmonology/sleep eval was recommeneded.  She has had no further chest pain and has noted less dyspnea over the past 24 hrs.  She has  been ambulating without difficulty and we plan to discharge her home today in good condition.  We will arrange for early f/u next week, and if she continues to report dyspnea without objective findings, we will give consideration to discontinuing brilinta in favor of plavix.  Discharge Vitals Blood pressure 104/52, pulse 56, temperature 98.3 F (36.8 C), temperature source Oral, resp. rate 18, height 5\' 6"  (1.676 m), weight 240 lb 1.6 oz (108.909 kg), SpO2 95.00%.  Filed Weights   03/26/13 0424 03/27/13 0500 03/28/13 0542  Weight: 242 lb 8.1 oz (110 kg) 244 lb 14.4 oz (111.086 kg) 240 lb 1.6 oz (108.909 kg)   Labs  CBC Lab Results  Component Value Date   WBC 8.9 03/25/2013   HGB 14.0 03/25/2013   HCT 41.3 03/25/2013   MCV 85.5 03/25/2013   PLT 229 04/22/8525    Basic Metabolic Panel  Recent Labs  03/28/13 0315  NA 139  K 4.1  CL 102  CO2 26  GLUCOSE 110*  BUN 18  CREATININE 0.88  CALCIUM 8.6   Liver Function Tests Lab Results  Component Value Date   ALT 37* 03/25/2013   AST 221* 03/25/2013   ALKPHOS 89 03/25/2013   BILITOT 0.3 03/25/2013    Cardiac Enzymes Lab Results  Component Value Date   CKTOTAL 2227* 03/25/2013   CKMB 367.9* 03/25/2013   TROPONINI >20.00* 03/25/2013   Fasting Lipid Panel Lab Results  Component Value Date   CHOL 165 03/25/2013   HDL 40 03/25/2013   LDLCALC 117* 03/25/2013   TRIG 40 03/25/2013   CHOLHDL 4.1 03/25/2013    Thyroid Function Tests Lab Results  Component Value Date   TSH 5.283* 03/24/2013    Disposition  Pt is being discharged home today in good condition.  Follow-up Plans & Appointments  Follow-up Information   Follow up with Jettie Booze., MD In 1 week. (We will arrange and contact you.)    Specialty:  Cardiology   Contact information:   [address] [place] [address] [phone]       Follow up with [name], MD. (as scheduled.)    Specialty:  Internal Medicine   Contact information:   [facility] [place] [address] [phone]      Discharge Medications    Medication List         aspirin 81 MG tablet  Take 81 mg by mouth daily.     atorvastatin 80 MG tablet  Commonly known as:  LIPITOR  Take 1 tablet (80 mg total) by mouth daily at 6 PM.     Calcium + D3 600-200 MG-UNIT Tabs  Take 1 tablet by mouth daily.     esomeprazole 40 MG capsule  Commonly known as:  NEXIUM  Take 40 mg by mouth daily before breakfast.     levothyroxine 100 MCG tablet  Commonly known as:  SYNTHROID, LEVOTHROID  Take 100 mcg by mouth daily before breakfast.     losartan 50 MG tablet  Commonly known as:  COZAAR  Take 50 mg by mouth daily.     metoprolol succinate 25 MG 24 hr tablet  Commonly known as:  TOPROL XL  Take 1 tablet (25 mg total) by  mouth daily.     nitroGLYCERIN 0.4 MG SL tablet  Commonly known as:  NITROSTAT  Place 1 tablet (0.4 mg total) under the tongue every 5 (five) minutes x 3 doses as needed for chest pain.     OMEGA 3 PO  Take 1 tablet by mouth daily.     Ticagrelor 90 MG Tabs tablet  Commonly known as:  BRILINTA  Take 1 tablet (90 mg total) by mouth 2 (two) times daily.       Outstanding Labs/Studies  Follow-up lipids/lft's in 6 wks.  Duration of Discharge Encounter   Greater than 30 minutes including physician time.  Signed, Murray Hodgkins NP 03/28/2013, 9:12 AM

## 2013-03-30 ENCOUNTER — Telehealth: Payer: Self-pay | Admitting: Interventional Cardiology

## 2013-03-30 NOTE — Telephone Encounter (Signed)
I called pt back and she states she is starting to not feel well. Her throat is raspy and she is having some SOB. Pts BP is 152/87 with HR of 94 bpm. Pt last took a benadryl at 1 pm. She will take another one after 5 pm. Appt made for pt to see Dr. Irish Lack and if symptoms worsen at all she will go to the ER.

## 2013-03-30 NOTE — Telephone Encounter (Signed)
Likely, this is a reaction to contrast dye.

## 2013-03-30 NOTE — Telephone Encounter (Signed)
New problem   Pt is still having a reaction from the dye they gave her from her procedure. Please call pt concerning this matter

## 2013-03-30 NOTE — Telephone Encounter (Signed)
Per Dr. Angelena Form have pt see Dr. Irish Lack in clinic tomorrow. If pt has problems breathing she needs to go to the ED.

## 2013-03-30 NOTE — Telephone Encounter (Signed)
Spoke with pt and she started having hives on Sunday night. Pt states she has a delayed reaction. Pt has taken benadryl about avery 6 hrs but it hasnt seemed to have done any good. Pt has the hives on her back, her bottom and her legs above her knee (very red with no bumps). Pt is feeling okay otherwise.

## 2013-03-30 NOTE — Telephone Encounter (Signed)
FYI to Dr. Varanasi.  

## 2013-03-31 ENCOUNTER — Ambulatory Visit (INDEPENDENT_AMBULATORY_CARE_PROVIDER_SITE_OTHER): Payer: Medicare Other | Admitting: Interventional Cardiology

## 2013-03-31 ENCOUNTER — Encounter: Payer: Self-pay | Admitting: Interventional Cardiology

## 2013-03-31 VITALS — BP 132/78 | HR 64 | Ht 66.0 in | Wt 237.0 lb

## 2013-03-31 DIAGNOSIS — R0602 Shortness of breath: Secondary | ICD-10-CM | POA: Diagnosis not present

## 2013-03-31 DIAGNOSIS — R21 Rash and other nonspecific skin eruption: Secondary | ICD-10-CM

## 2013-03-31 DIAGNOSIS — I10 Essential (primary) hypertension: Secondary | ICD-10-CM | POA: Diagnosis not present

## 2013-03-31 DIAGNOSIS — I2510 Atherosclerotic heart disease of native coronary artery without angina pectoris: Secondary | ICD-10-CM | POA: Diagnosis not present

## 2013-03-31 LAB — BRAIN NATRIURETIC PEPTIDE: Pro B Natriuretic peptide (BNP): 142 pg/mL — ABNORMAL HIGH (ref 0.0–100.0)

## 2013-03-31 MED ORDER — METHYLPREDNISOLONE 4 MG PO KIT: PACK | ORAL | Status: DC

## 2013-03-31 NOTE — Patient Instructions (Addendum)
Your physician recommends that you schedule a follow-up appointment in: 4 weeks (appt on 04/08/13 should be cancelled)  Your physician recommends that you return for lab work today for BNP.  Your physician has recommended you make the following change in your medication:   1. Start Medrol dose pack. See instructions on label when you pick up at pharmacy.   Continue taking benadryl every six hours prn for now.

## 2013-03-31 NOTE — Telephone Encounter (Signed)
Pt seen in  Office today

## 2013-03-31 NOTE — Progress Notes (Signed)
Patient ID: [name], female   DOB: 1940-09-21, 73 y.o.   MRN: [id number]    [name], [name], [name]  [address] Phone: [phone] Fax:  [phone]  Date:  03/31/2013   ID:  [name] [birth year], MRN [id number]  PCP:  [name]      History of Present Illness: [name] is a 73 y.o. female who had an inferoposterior MI a week ago. She underwent bare-metal stent placement to her circumflex. She tolerated the procedure well but then did develop chest discomfort and ventricular tachycardia later that night. She had a repeat cardiac catheterization the next day which revealed a patent circumflex stent. She stayed in the hospital for a few more days and went home. Since coming home, she reports some dyspnea on exertion which is more than usual. She developed a rash, particularly on her back that itches. She thinks is related to the contrast. No further chest discomfort. She has not used any nitroglycerin. She plans on participating in cardiac rehabilitation.  She has been taking Benadryl for her rash. There may be some improvement but it is still quite bothersome.    Wt Readings from Last 3 Encounters:  03/31/13 237 lb (107.502 kg)  03/28/13 240 lb 1.6 oz (108.909 kg)  03/28/13 240 lb 1.6 oz (108.909 kg)     Past Medical History  Diagnosis Date  . IV Contrast Allergy   . GERD (gastroesophageal reflux disease)   . Hypothyroidism   . Rosacea   . Hearing loss     a. s/p cochlear implant on right, hearing aid on left.  . Obesity   . Fibroids     a. uterine - spotting noted since 03/11/2013.  Marland Kitchen CAD (coronary artery disease)     a. 03/2013 Infpost STEMI/PCI: LM nl, LAD min irregs, LCX 100 (3.0x12 Vision BMS), RCA  mild ostial dzs, EF 55%.  . Essential hypertension, benign   . Ventricular tachycardia     a. Immediate post pci/MI - no complications, on BB.  Marland Kitchen Hyperlipidemia     Current Outpatient Prescriptions  Medication Sig  Dispense Refill  . aspirin 81 MG tablet Take 81 mg by mouth daily.        Marland Kitchen atorvastatin (LIPITOR) 80 MG tablet Take 1 tablet (80 mg total) by mouth daily at 6 PM.  30 tablet  6  . Calcium Carb-Cholecalciferol (CALCIUM + D3) 600-200 MG-UNIT TABS Take 1 tablet by mouth daily.      Marland Kitchen esomeprazole (NEXIUM) 40 MG capsule Take 40 mg by mouth daily before breakfast.        . levothyroxine (SYNTHROID, LEVOTHROID) 100 MCG tablet Take 100 mcg by mouth daily before breakfast.      . losartan (COZAAR) 50 MG tablet Take 50 mg by mouth daily.      . metoprolol succinate (TOPROL XL) 25 MG 24 hr tablet Take 1 tablet (25 mg total) by mouth daily.  30 tablet  6  . nitroGLYCERIN (NITROSTAT) 0.4 MG SL tablet Place 1 tablet (0.4 mg total) under the tongue every 5 (five) minutes x 3 doses as needed for chest pain.  25 tablet  3  . Ticagrelor (BRILINTA) 90 MG TABS tablet Take 1 tablet (90 mg total) by mouth 2 (two) times daily.  60 tablet  0   No current facility-administered medications for this visit.    Allergies:    Allergies  Allergen Reactions  . Iohexol Hives  Social History:  The patient  reports that she has never smoked. She does not have any smokeless tobacco history on file. She reports that she does not drink alcohol or use illicit drugs.   Family History:  The patient's family history includes Colon cancer in her cousin; Heart failure in her mother; Other in her father. There is no history of Esophageal cancer or Stomach cancer.   ROS:  Please see the history of present illness.  No nausea, vomiting.  No fevers, chills.  No focal weakness.  No dysuria. Rash.   All other systems reviewed and negative.   PHYSICAL EXAM: VS:  BP 132/78  Pulse 64  Ht 5\' 6"  (1.676 m)  Wt 237 lb (107.502 kg)  BMI 38.27 kg/m2 Well nourished, well developed, in no acute distress HEENT: normal Neck: no JVD, no carotid bruits Cardiac:  normal S1, S2; RRR;  Lungs:  clear to auscultation bilaterally, no wheezing,  rhonchi or rales Abd: soft, nontender, no hepatomegaly Ext: no edema, 2+ radial pulses bilaterally. Both radial entry sites are bruised, right greater than left Skin: warm and dry; macular, erythematous rash all over her back and sides of her legs Neuro:   no focal abnormalities noted      ASSESSMENT AND PLAN:  1. Rash: Will start a steroid taper. Continue Benadryl as well. This is likely a reaction to the contrast dye that she received. In the past, she has had late reactions to contrast, about 48-72 hours post procedure. If she does not respond to the steroid taper, would have to consider that could be one of her newer medications that is causing this rash. Would likely switch Brilinta to clopidogrel or Effient.  2. Shortness of breath: Check BNP today. If she does have fluid overload, will start diuretic. This could be exacerbated by steroids.  3. Recent inferior MI: Bare-metal stent used due to some uterine bleeding that she's had.  Post procedure, she had ventricular tachycardia and underwent repeat cardiac catheterization which showed a widely patent stent.   No lightheadedness or syncope. The arrhythmia was likely related to her MI. Echocardiogram from the hospital was reviewed. Unfortunately, they're unable to estimate ejection fraction. From her ventriculogram, her ejection fraction appeared well preserved.  Signed, Kimberly Marble, [name], [name] [employer] 03/31/2013 11:13 AM

## 2013-04-01 ENCOUNTER — Telehealth: Payer: Self-pay | Admitting: Cardiology

## 2013-04-01 DIAGNOSIS — Z79899 Other long term (current) drug therapy: Secondary | ICD-10-CM

## 2013-04-01 MED ORDER — POTASSIUM CHLORIDE CRYS ER 20 MEQ PO TBCR: 20.0000 meq | EXTENDED_RELEASE_TABLET | Freq: Every day | ORAL | Status: DC

## 2013-04-01 MED ORDER — FUROSEMIDE 20 MG PO TABS: 20.0000 mg | ORAL_TABLET | Freq: Every day | ORAL | Status: DC

## 2013-04-01 NOTE — Telephone Encounter (Signed)
Message copied by Alcario Drought on Thu Apr 01, 2013  2:25 PM ------      Message from: Jettie Booze      Created: Thu Apr 01, 2013 12:06 PM       Mildly elevated. Start furosemide 20 mg daily. Start potassium 20 mEq daily. Check Bmet in one week. ------

## 2013-04-01 NOTE — Telephone Encounter (Signed)
Pt notified. Meds updated. Labs ordered.

## 2013-04-06 ENCOUNTER — Telehealth: Payer: Self-pay | Admitting: Interventional Cardiology

## 2013-04-06 NOTE — Telephone Encounter (Signed)
New message     C/o shortness of breathe---pt is hard of hearing so cardiac rehab nurse is calling

## 2013-04-06 NOTE — Telephone Encounter (Signed)
Joann from cardiac rehab calling re Ms. Moree.  She had called her to set up her app for orientation in rehab.  While talking to her pt tells her that she is more SOB than last week.  Pt states that she has to sit up at night to sleep.  Pt states that she doesn't have any swelling in her legs. Was started on Lasix 20 mg last week but says her breathing seems to be worse. Has not weighted herself so doesn't know if wt has increased. Pt is very hard of hearing-has cochlear implant.  Added her to [facility] schedule for tomorrow. Arville Go was going to call pt and I called and LM with her sister at Home # [phone]; and cell [phone].

## 2013-04-07 ENCOUNTER — Encounter: Payer: Self-pay | Admitting: Cardiology

## 2013-04-07 ENCOUNTER — Ambulatory Visit (INDEPENDENT_AMBULATORY_CARE_PROVIDER_SITE_OTHER): Payer: Medicare Other | Admitting: Cardiology

## 2013-04-07 VITALS — BP 127/70 | HR 83 | Ht 66.0 in | Wt 229.1 lb

## 2013-04-07 DIAGNOSIS — R0989 Other specified symptoms and signs involving the circulatory and respiratory systems: Secondary | ICD-10-CM

## 2013-04-07 DIAGNOSIS — I2510 Atherosclerotic heart disease of native coronary artery without angina pectoris: Secondary | ICD-10-CM

## 2013-04-07 DIAGNOSIS — R0609 Other forms of dyspnea: Secondary | ICD-10-CM | POA: Diagnosis not present

## 2013-04-07 DIAGNOSIS — I2119 ST elevation (STEMI) myocardial infarction involving other coronary artery of inferior wall: Secondary | ICD-10-CM | POA: Diagnosis not present

## 2013-04-07 LAB — CBC WITH DIFFERENTIAL/PLATELET
BASOS ABS: 0.1 10*3/uL (ref 0.0–0.1)
Basophils Relative: 0.4 % (ref 0.0–3.0)
Eosinophils Absolute: 0.2 10*3/uL (ref 0.0–0.7)
Eosinophils Relative: 1.8 % (ref 0.0–5.0)
HEMATOCRIT: 49.9 % — AB (ref 36.0–46.0)
Hemoglobin: 16.8 g/dL — ABNORMAL HIGH (ref 12.0–15.0)
LYMPHS ABS: 1.5 10*3/uL (ref 0.7–4.0)
Lymphocytes Relative: 11.3 % — ABNORMAL LOW (ref 12.0–46.0)
MCHC: 33.7 g/dL (ref 30.0–36.0)
MCV: 84.5 fl (ref 78.0–100.0)
MONO ABS: 0.8 10*3/uL (ref 0.1–1.0)
MONOS PCT: 6.5 % (ref 3.0–12.0)
Neutro Abs: 10.3 10*3/uL — ABNORMAL HIGH (ref 1.4–7.7)
Neutrophils Relative %: 80 % — ABNORMAL HIGH (ref 43.0–77.0)
Platelets: 272 10*3/uL (ref 150.0–400.0)
RBC: 5.91 Mil/uL — ABNORMAL HIGH (ref 3.87–5.11)
RDW: 14.1 % (ref 11.5–14.6)
WBC: 12.9 10*3/uL — ABNORMAL HIGH (ref 4.5–10.5)

## 2013-04-07 LAB — BASIC METABOLIC PANEL
BUN: 21 mg/dL (ref 6–23)
CO2: 26 mEq/L (ref 19–32)
Calcium: 9.4 mg/dL (ref 8.4–10.5)
Chloride: 97 mEq/L (ref 96–112)
Creatinine, Ser: 1.2 mg/dL (ref 0.4–1.2)
GFR: 47.6 mL/min — AB (ref >60.00)
Glucose, Bld: 103 mg/dL — ABNORMAL HIGH (ref 70–99)
POTASSIUM: 4.2 meq/L (ref 3.5–5.1)
Sodium: 133 mEq/L — ABNORMAL LOW (ref 135–145)

## 2013-04-07 NOTE — Progress Notes (Signed)
Patient ID: [name] MRN: [id number], DOB/AGE: 18-Jan-1941   Date of Visit: 04/07/2013  Primary Physician: Burnard Bunting, MD  Primary Cardiologist: Casandra Doffing, MD Reason for Visit: Shortness of breath  History of Present Illness  [name] is a 73 y.o. female with CAD s/p inferoposterior MI 2 weeks ago. She had single vessel disease and underwent bare-metal stent placement to the circumflex. She tolerated the procedure well but then developed chest discomfort and ventricular tachycardia later that night. She had a repeat cardiac catheterization the next day which revealed a patent circumflex stent. LVEF 55%.  She presented one week ago for follow-up and was experiencing SOB. Dr. Irish Lack prescribed low dose Lasix to see if this would help. At that time she had also developed a rash which was felt to be related to her dye allergy. She was prescribed a steroid taper. She presents today for follow-up and is accompanied by her sister. She reports her SOB seems to be worse not better. Yesterday was a particularly bad day. She had constant SOB but with significant worsening with minimal activity such as walking room to room in her home. She describes as a "suffocating feeling." She denies CP. She denies palpitations, dizziness, near syncope or syncope. She denies LE swelling, orthopnea or PND. She denies cough, fever or chills. She denies nosebleeds, blood in urine or stool or black, tarry stools. She is compliant with medications, stating she has not missed any doses specifically Brilinta.     Past Medical History Past Medical History  Diagnosis Date  . IV Contrast Allergy   . GERD (gastroesophageal reflux disease)   . Hypothyroidism   . Rosacea   . Hearing loss     a. s/p cochlear implant on right, hearing aid on left.  . Obesity   . Fibroids     a. uterine - spotting noted since 03/11/2013.  Marland Kitchen CAD (coronary artery disease)     a. 03/2013 Infpost STEMI/PCI: LM nl, LAD min  irregs, LCX 100 (3.0x12 Vision BMS), RCA  mild ostial dzs, EF 55%.  . Essential hypertension, benign   . Ventricular tachycardia     a. Immediate post pci/MI - no complications, on BB.  Marland Kitchen Hyperlipidemia     Past Surgical History Past Surgical History  Procedure Laterality Date  . Cochlear implant    . Upper gastrointestinal endoscopy    . Cervical polypectomy      2010  . Cholecystectomy    . Uterine polyp      had D and C done to remove the polyp    Allergies/Intolerances Allergies  Allergen Reactions  . Iohexol Hives    Current Home Medications Current Outpatient Prescriptions  Medication Sig Dispense Refill  . aspirin 81 MG tablet Take 81 mg by mouth daily.        Marland Kitchen atorvastatin (LIPITOR) 80 MG tablet Take 1 tablet (80 mg total) by mouth daily at 6 PM.  30 tablet  6  . Calcium Carb-Cholecalciferol (CALCIUM + D3) 600-200 MG-UNIT TABS Take 1 tablet by mouth daily.      Marland Kitchen esomeprazole (NEXIUM) 40 MG capsule Take 40 mg by mouth daily before breakfast.        . furosemide (LASIX) 20 MG tablet Take 1 tablet (20 mg total) by mouth daily.  30 tablet  6  . levothyroxine (SYNTHROID, LEVOTHROID) 100 MCG tablet Take 100 mcg by mouth daily before breakfast.      . losartan (COZAAR) 50 MG tablet Take  50 mg by mouth daily.      . metoprolol succinate (TOPROL XL) 25 MG 24 hr tablet Take 1 tablet (25 mg total) by mouth daily.  30 tablet  6  . nitroGLYCERIN (NITROSTAT) 0.4 MG SL tablet Place 1 tablet (0.4 mg total) under the tongue every 5 (five) minutes x 3 doses as needed for chest pain.  25 tablet  3  . potassium chloride SA (K-DUR,KLOR-CON) 20 MEQ tablet Take 1 tablet (20 mEq total) by mouth daily.  30 tablet  6  . Ticagrelor (BRILINTA) 90 MG TABS tablet Take 1 tablet (90 mg total) by mouth 2 (two) times daily.  60 tablet  0   No current facility-administered medications for this visit.    Social History History   Social History  . Marital Status: Divorced    Spouse Name: N/A     Number of Children: N/A  . Years of Education: N/A   Occupational History  . Not on file.   Social History Main Topics  . Smoking status: Never Smoker   . Smokeless tobacco: Not on file  . Alcohol Use: No  . Drug Use: No  . Sexual Activity:    Other Topics Concern  . Not on file   Social History Narrative   Lives in [place] by herself.  Sister is nearby and is Healthcare POA.     Review of Systems General: No chills, fever, night sweats or weight changes Cardiovascular: No chest pain, dyspnea on exertion, edema, orthopnea, palpitations, paroxysmal nocturnal dyspnea Dermatological: No rash, lesions or masses Respiratory: No cough, dyspnea Urologic: No hematuria, dysuria Abdominal: No nausea, vomiting, diarrhea, bright red blood per rectum, melena, or hematemesis Neurologic: No visual changes, weakness, changes in mental status All other systems reviewed and are otherwise negative except as noted above.  Physical Exam Vitals: Blood pressure 127/70, pulse 83, height 5\' 6"  (1.676 m), weight 229 lb 1.9 oz (103.928 kg), SpO2 96.00%.  General: Well developed, well appearing 73 y.o. female in no acute distress. HEENT: Normocephalic, atraumatic. EOMs intact. Sclera nonicteric. Oropharynx clear.  Neck: Supple without bruits. No JVD. Lungs: Respirations regular and unlabored, CTA bilaterally. No wheezes, rales or rhonchi. Heart: RRR. S1, S2 present. No murmurs, rub, S3 or S4. Abdomen: Soft, non-distended.  Extremities: No clubbing, cyanosis or edema. PT/Radials 2+ and equal bilaterally. Psych: Normal affect. Neuro: Alert and oriented X 3. Moves all extremities spontaneously.   Diagnostics Recent labs  ProBNP 142 BMET    Component Value Date/Time   NA 139 03/28/2013 0315   K 4.1 03/28/2013 0315   CL 102 03/28/2013 0315   CO2 26 03/28/2013 0315   GLUCOSE 110* 03/28/2013 0315   BUN 18 03/28/2013 0315   CREATININE 0.88 03/28/2013 0315   CALCIUM 8.6 03/28/2013 0315   GFRNONAA 65*  03/28/2013 0315   GFRAA 75* 03/28/2013 0315    Cardiac cath 03/24/2013 Hemodynamica: Aortic pressure was 131/71; LV pressure was 130/16; LVEDP 26. There was no gradient between the left ventricle and aorta.  Angiographic data: The left main coronary artery is short, but widely patent.  The left anterior descending artery is large vessel with mild irregularities. There are several small to medium-sized diagonal vessels which are widely patent.  The left circumflex artery is a large vessel. The proximal vessel was occluded. After revascularization, it was noted that there is a large first obtuse marginal. The remainder of the circumflex appears angiographically normal. There is a large second obtuse marginal which is patent.  The right coronary artery is a large dominant vessel. There is mild ostial disease. The posterior descending vessel is small but patent. The posterior lateral artery is small but patent.  Left ventriculogram: Left ventricular angiogram was done in the 30 RAO projection and revealed normal left ventricular wall motion and systolic function with an estimated ejection fraction of 55 %. LVEDP was 26 MmHg.  PCI narrative: A CLS 3.0 guiding catheters using his left main. The guide catheter selectively engage the LAD. A pro-water wire was placed into the LAD and the guide catheter was pulled back. A BMW wire was placed into the circumflex, and eventually across the occlusion which is the culprit for her presentation today. A priority 1 aspiration catheter was used with 2 passes made. TIMI-3 flow was restored. There is successful removal of thrombus. A 2.5 x 12 balloon was used to predilate the lesion. A 3.0 x 12 vision stent was deployed at 18 atmospheres. The stent was post dilated with a 3.5 x 8 noncompliant balloon. There is an excellent angiographic result. There is no residual stenosis. The patient did have some bradycardia with reperfusion. This resolved spontaneously before any atropine  had been given. She had some nausea post intervention which was treated with Zofran.  Impression:  1. Normal left main coronary artery. 2. Widely patent left anterior descending artery and its branches. 3. Occluded proximal left circumflex artery which was successfully stented with a 3.0 x 12 bare-metal vision stent. The stent was post dilated to greater than 3.5 mm in diameter. 4. Mild disease in the large, dominant right coronary artery. 5. Normal left ventricular systolic function. LVEDP 26 mmHg. Ejection fraction 55%. Recommendation: Continue dual antiplatelet therapy for at least a month. He bare-metal stent was chosen because the patient has had some dysfunctional uterine bleeding and there is concern for fibroids. The occlusion was short and that vessel diameter was large. She is nondiabetic. Hopefully, she will do well with a bare-metal stent. Last initiate statin and beta blocker as tolerated. Hopefully, she can move out of the unit tomorrow. If she does well, a discharge on Friday would be reasonable.  Repeat cardiac cath 03/25/2013 Hemodynamic Findings:  Central aortic pressure: 111/59  Left ventricular pressure: 110/12/19  Angiographic Findings:  Left main: Normal caliber, short segment. No obstructive disease.  Left Anterior Descending Artery: Large caliber vessel that courses to the apex. There are mild luminal irregularities in the mid vessel. There are several small to medium caliber diagonal vessels without significant disease.  Circumflex Artery: Large caliber vessel with medium caliber obtuse marginal branch. The proximal stent is patent without restenosis, thrombus or appearance of disruption of vessel pre or post stent.  Right Coronary Artery: Large dominant vessel with mild proximal disease, 30% mid stenosis. The PDA and posterolateral branches are small in caliber.  Left Ventricular Angiogram: Deferred.  Impression:  1. Single vessel CAD with patent stent proximal  Circumflex.  2. Chest pain and VT are likely the result of recent infarction.  Echocardiogram 03/25/2013 Study Conclusions - Left ventricle: Poor acoustic windows LV is not seen well enough to evaluate function. The cavity size was normal. Wall thickness was increased in a pattern of mild LVH. Doppler parameters are consistent with abnormal left ventricular relaxation (grade 1 diastolic dysfunction). - Mitral valve: Mild regurgitation. - Pulmonary arteries: PA peak pressure: 5mm Hg (S).  12-lead ECG today - NSR at 88 bpm; no acute ST-T wave abnormalities; Q waves noted in leads III and aVF; PR 142,  QRS 86, QT/QTc 368/445  Assessment and Plan  1. DOE - unclear etiology at this time - ECG shows no acute ST-T wave abnormalities - history of uterine bleeding, now on Brilinta and ASA, will check CBC to rule out anemia - check BMET following diuresis  - stop Lasix - discussed with Dr. Irish Lack over the phone today - recommend discontinuing Brilinta after tonight's dose and will start Effient 10 mg once daily tomorrow 04/08/2013 - due to hearing difficulties over the phone, Ms. Graw requests that we call her sister, Aida Puffer, with instructions [phone] home [phone] mobile  2. CAD s/p recent inferoposterior MI - no CP - continue medical therapy - preserved LV function, EF 55% by recent cath  SignedIleene Hutchinson, PA-C 04/07/2013, 3:29 PM  ADDENDUM: Lab results reviewed with additional recommendations below    Component Value Date/Time   WBC 12.9* 04/07/2013 1539   RBC 5.91* 04/07/2013 1539   HGB 16.8* 04/07/2013 1539   HCT 49.9* 04/07/2013 1539   PLT 272.0 04/07/2013 1539   MCV 84.5 04/07/2013 1539   MCH 29.0 03/25/2013 0405   MCHC 33.7 04/07/2013 1539   RDW 14.1 04/07/2013 1539   LYMPHSABS 1.5 04/07/2013 1539   MONOABS 0.8 04/07/2013 1539   EOSABS 0.2 04/07/2013 1539   BASOSABS 0.1 04/07/2013 1539      Component Value Date/Time   NA 133* 04/07/2013 1539   K 4.2 04/07/2013  1539   CL 97 04/07/2013 1539   CO2 26 04/07/2013 1539   GLUCOSE 103* 04/07/2013 1539   BUN 21 04/07/2013 1539   CREATININE 1.2 04/07/2013 1539   CALCIUM 9.4 04/07/2013 1539   GFRNONAA 65* 03/28/2013 0315   GFRAA 75* 03/28/2013 0315   Discussed with Dr. Irish Lack - WBCs elevated with evidence of left shift on differential. ? infectious cause for SOB. In addition, RBCs and Hgb/Hct are elevated. Oral steroid can cause WBC elevation; however, I do not think that explains left shift on differential or elevated RBCs, Hgb/HCt. Will order chest x-ray. Continue Brilinta for now until chest x-ray results are back. Also instructed Ms. Roe to follow-up with her PCP. Spoke with her sister who expressed verbal understanding of plan. Note also forwarded to PCP, Dr. Reynaldo Minium.

## 2013-04-07 NOTE — Patient Instructions (Signed)
Your physician recommends that you continue on your current medications as directed. Please refer to the Current Medication list given to you today.  Your physician recommends that you return for lab work in: ([place]) BMET, CBC  WILL TALK TO DR. VARANASI ABOUT MEDICATIONS CHANGES AND WILL CALL YOU BACK

## 2013-04-08 ENCOUNTER — Ambulatory Visit (INDEPENDENT_AMBULATORY_CARE_PROVIDER_SITE_OTHER)
Admission: RE | Admit: 2013-04-08 | Discharge: 2013-04-08 | Disposition: A | Discharge status: Home / Self Care | Payer: Medicare Other | Source: Ambulatory Visit | Attending: Cardiology | Admitting: Cardiology

## 2013-04-08 ENCOUNTER — Ambulatory Visit
Admission: RE | Admit: 2013-04-08 | Discharge: 2013-04-08 | Disposition: A | Discharge status: Home / Self Care | Payer: Medicare Other | Source: Ambulatory Visit | Attending: Cardiology | Admitting: Cardiology

## 2013-04-08 ENCOUNTER — Encounter: Payer: Medicare Other | Admitting: Physician Assistant

## 2013-04-08 ENCOUNTER — Other Ambulatory Visit: Payer: Medicare Other

## 2013-04-08 DIAGNOSIS — R0989 Other specified symptoms and signs involving the circulatory and respiratory systems: Secondary | ICD-10-CM | POA: Diagnosis not present

## 2013-04-08 DIAGNOSIS — R0609 Other forms of dyspnea: Secondary | ICD-10-CM | POA: Diagnosis not present

## 2013-04-08 DIAGNOSIS — J40 Bronchitis, not specified as acute or chronic: Secondary | ICD-10-CM | POA: Diagnosis not present

## 2013-04-09 ENCOUNTER — Encounter: Payer: Self-pay | Admitting: Cardiology

## 2013-04-12 ENCOUNTER — Telehealth: Payer: Self-pay | Admitting: Interventional Cardiology

## 2013-04-12 DIAGNOSIS — I10 Essential (primary) hypertension: Secondary | ICD-10-CM | POA: Diagnosis not present

## 2013-04-12 DIAGNOSIS — Z6836 Body mass index (BMI) 36.0-36.9, adult: Secondary | ICD-10-CM | POA: Diagnosis not present

## 2013-04-12 DIAGNOSIS — E785 Hyperlipidemia, unspecified: Secondary | ICD-10-CM | POA: Diagnosis not present

## 2013-04-12 DIAGNOSIS — I219 Acute myocardial infarction, unspecified: Secondary | ICD-10-CM | POA: Diagnosis not present

## 2013-04-12 DIAGNOSIS — R0609 Other forms of dyspnea: Secondary | ICD-10-CM | POA: Diagnosis not present

## 2013-04-12 MED ORDER — LOSARTAN POTASSIUM 50 MG PO TABS: 25.0000 mg | ORAL_TABLET | Freq: Every day | ORAL | Status: DC

## 2013-04-12 MED ORDER — PRASUGREL HCL 10 MG PO TABS: 10.0000 mg | ORAL_TABLET | Freq: Every day | ORAL | Status: DC

## 2013-04-12 NOTE — Telephone Encounter (Signed)
Per Dr. Mare Ferrari pt should decrease losartan to 25 mg daily.

## 2013-04-12 NOTE — Telephone Encounter (Signed)
New message  Sister is calling waiting results of chest xrays, please call and advise. She is very frustrated that no one has called back with results.

## 2013-04-12 NOTE — Telephone Encounter (Signed)
Pts sister notified of med change. If pt has already taken 1 dose of Brilinta today then she will take second dose this afternoon to finish out this medication. Then she will start Effient 10 mg starting tomorrow. If pt hasnt taken any Brillinta today then she will start Effient this afternoon. Pts daughter is still concerned about pts low BP.

## 2013-04-12 NOTE — Telephone Encounter (Signed)
Per Ileene Hutchinson, PA OV note continue Brillinta until chest-ray is back. Chest X-ray shows no acute abnormality.

## 2013-04-12 NOTE — Telephone Encounter (Signed)
Pts sister notified and pt will decrease losartan to 25 mg qd to help with low bp. Pt will be seeing PCP today at 2:40pm. Notes faxed. FYI to Dr. Irish Lack.

## 2013-04-12 NOTE — Telephone Encounter (Signed)
Per Dr. Mare Ferrari (DOD) pt can stop Brillinta and start Effient 10 mg.

## 2013-04-12 NOTE — Telephone Encounter (Signed)
Pts sister notified per demographics. Pt thinks she really needs to be switched from Brilinta to Plavix because of the severe SOB. Pts BPs is running 90-80's/50-40's. Pt has to sit up just to sleep.

## 2013-04-21 DIAGNOSIS — H903 Sensorineural hearing loss, bilateral: Secondary | ICD-10-CM | POA: Diagnosis not present

## 2013-04-24 ENCOUNTER — Ambulatory Visit (INDEPENDENT_AMBULATORY_CARE_PROVIDER_SITE_OTHER): Payer: Medicare Other | Admitting: Family Medicine

## 2013-04-24 VITALS — BP 124/84 | HR 72 | Temp 97.7°F | Resp 16 | Ht 66.0 in | Wt 229.2 lb

## 2013-04-24 DIAGNOSIS — B372 Candidiasis of skin and nail: Secondary | ICD-10-CM

## 2013-04-24 DIAGNOSIS — R7309 Other abnormal glucose: Secondary | ICD-10-CM

## 2013-04-24 DIAGNOSIS — R21 Rash and other nonspecific skin eruption: Secondary | ICD-10-CM

## 2013-04-24 MED ORDER — NYSTATIN 100000 UNIT/GM EX CREA: 1.0000 "application " | TOPICAL_CREAM | Freq: Two times a day (BID) | CUTANEOUS | Status: DC

## 2013-04-24 NOTE — Progress Notes (Signed)
Subjective:   This chart was scribed for Wardell Honour, MD by Forrestine Him, [facility], [place] Scribe. This patient was seen in room 14 and the patient's care was started 1:50 PM.    Patient ID: [name], female    DOB: [birth year], 73 y.o.   MRN: [id number]  04/24/2013  Cellulitis   HPI  HPI Comments: [name] is a 73 y.o. female who presents to [facility] complaining of suspected cellulitis to the lower abdominal fold with associated odor that initially appeared this morning. She describes the area as erythematous and "burning". Pt was recently in the hospital for stent placement and a heart catheterization, and associated her possible skin infection with something she may have "picked up from the hospital". She has placed baby powder to the area just prior to arrival. She denies any itching to the area at this time. She states she has been taking Lasix and reports worsening dry skin that she potentially associates with her suspected skin infection as well. Denies a history of diabetes. Denies any similar rashes in the past. Pt reports a past medical history of an MI 03/24/12.   Review of Systems  Constitutional: Negative for fever and chills.  Skin: Positive for rash.    Past Medical History  Diagnosis Date  . IV Contrast Allergy   . GERD (gastroesophageal reflux disease)   . Hypothyroidism   . Rosacea   . Hearing loss     a. s/p cochlear implant on right, hearing aid on left.  . Obesity   . Fibroids     a. uterine - spotting noted since 03/11/2013.  Marland Kitchen CAD (coronary artery disease)     a. 03/2013 Infpost STEMI/PCI: LM nl, LAD min irregs, LCX 100 (3.0x12 Vision BMS), RCA  mild ostial dzs, EF 55%.  . Essential hypertension, benign   . Ventricular tachycardia     a. Immediate post pci/MI - no complications, on BB.  Marland Kitchen Hyperlipidemia   . Myocardial infarction    Allergies  Allergen Reactions  . Bee Venom Itching and Swelling  .  Iohexol Hives   Current Outpatient Prescriptions  Medication Sig Dispense Refill  . aspirin 81 MG tablet Take 81 mg by mouth daily.      . Calcium Carb-Cholecalciferol (CALCIUM + D3) 600-200 MG-UNIT TABS Take 1 tablet by mouth daily.    Marland Kitchen levothyroxine (SYNTHROID, LEVOTHROID) 100 MCG tablet Take 100 mcg by mouth daily before breakfast.    . nitroGLYCERIN (NITROSTAT) 0.4 MG SL tablet Place 1 tablet (0.4 mg total) under the tongue every 5 (five) minutes x 3 doses as needed for chest pain. 25 tablet 3  . atorvastatin (LIPITOR) 80 MG tablet Take 0.5 tablets (40 mg total) by mouth daily at 6 PM. 30 tablet 6  . clopidogrel (PLAVIX) 75 MG tablet Take 1 tablet (75 mg total) by mouth daily. 30 tablet 11  . FUROSEMIDE PO Take 20 mg by mouth as needed (Directed to take with KDur for weight gain.). unknown    . metoprolol succinate (TOPROL-XL) 25 MG 24 hr tablet TAKE 1 TABLET DAILY. 30 tablet 6  . metroNIDAZOLE (METROGEL) 0.75 % gel Apply 1 application topically daily. Patient places on her cheeks and nose, only about once or twice a week depending on the climate.    Marland Kitchen omega-3 acid ethyl esters (LOVAZA) 1 G capsule Take 1 g by mouth daily.    . pantoprazole (PROTONIX) 40 MG tablet Take  1 tablet (40 mg total) by mouth daily. 30 tablet 11  . POTASSIUM PO Take 20 mg by mouth as needed (Directed to take with lasix for weight gain). Unknown dose     No current facility-administered medications for this visit.       Objective:    BP 124/84  Pulse 72  Temp(Src) 97.7 F (36.5 C) (Oral)  Resp 16  Ht 5\' 6"  (1.676 m)  Wt 229 lb 3.2 oz (103.964 kg)  BMI 37.01 kg/m2  SpO2 94%  Physical Exam  Nursing note and vitals reviewed. Constitutional: She is oriented to person, place, and time. She appears well-developed and well-nourished. No distress.  HENT:  Head: Normocephalic and atraumatic.  Eyes: EOM are normal.  Neck: Normal range of motion.  Cardiovascular: Normal rate.   Pulmonary/Chest: Effort  normal.  Musculoskeletal: Normal range of motion.  Neurological: She is alert and oriented to person, place, and time.  Skin: Skin is warm and dry. She is not diaphoretic.  6 cm x 6 cm lower abdominal skin fold with diffuse erythema  Psychiatric: She has a normal mood and affect. Her behavior is normal.       Assessment & Plan:  Rash and nonspecific skin eruption  Cutaneous candidiasis - Plan: DISCONTINUED: nystatin cream (MYCOSTATIN)    1. Cutaneous candidiasis: New.  Rx for Nystatin cream provided to apply bid to tid.  No evidence of cellulitis.  Recent hospital records reviewed.  Normal glucose during admission.  RTC for worsening rash, fever, pain, drainage.   Meds ordered this encounter  Medications  . DISCONTD: nystatin cream (MYCOSTATIN)    Sig: Apply 1 application topically 2 (two) times daily.    Dispense:  30 g    Refill:  0    Return if symptoms worsen or fail to improve.    I personally performed the services described in this documentation, which was scribed in my presence.  The recorded information has been reviewed and is accurate.  Reginia Forts, M.D.  [facility] [address] [place], [place]  [address] [phone] phone [phone] fax

## 2013-04-24 NOTE — Patient Instructions (Signed)
Candida Infection, Adult A candida infection (also called yeast, fungus and Monilia infection) is an overgrowth of yeast that can occur anywhere on the body. A yeast infection commonly occurs in warm, moist body areas. Usually, the infection remains localized but can spread to become a systemic infection. A yeast infection may be a sign of a more severe disease such as diabetes, leukemia, or AIDS. A yeast infection can occur in both men and women. In women, Candida vaginitis is a vaginal infection. It is one of the most common causes of vaginitis. Men usually do not have symptoms or know they have an infection until other problems develop. Men may find out they have a yeast infection because their sex partner has a yeast infection. Uncircumcised men are more likely to get a yeast infection than circumcised men. This is because the uncircumcised glans is not exposed to air and does not remain as dry as that of a circumcised glans. Older adults may develop yeast infections around dentures. CAUSES  Women  Antibiotics.  Steroid medication taken for a long time.  Being overweight (obese).  Diabetes.  Poor immune condition.  Certain serious medical conditions.  Immune suppressive medications for organ transplant patients.  Chemotherapy.  Pregnancy.  Menstration.  Stress and fatigue.  Intravenous drug use.  Oral contraceptives.  Wearing tight-fitting clothes in the crotch area.  Catching it from a sex partner who has a yeast infection.  Spermicide.  Intravenous, urinary, or other catheters. Men  Catching it from a sex partner who has a yeast infection.  Having oral or anal sex with a person who has the infection.  Spermicide.  Diabetes.  Antibiotics.  Poor immune system.  Medications that suppress the immune system.  Intravenous drug use.  Intravenous, urinary, or other catheters. SYMPTOMS  Women  Thick, white vaginal discharge.  Vaginal itching.  Redness and  swelling in and around the vagina.  Irritation of the lips of the vagina and perineum.  Blisters on the vaginal lips and perineum.  Painful sexual intercourse.  Low blood sugar (hypoglycemia).  Painful urination.  Bladder infections.  Intestinal problems such as constipation, indigestion, bad breath, bloating, increase in gas, diarrhea, or loose stools. Men  Men may develop intestinal problems such as constipation, indigestion, bad breath, bloating, increase in gas, diarrhea, or loose stools.  Dry, cracked skin on the penis with itching or discomfort.  Jock itch.  Dry, flaky skin.  Athlete's foot.  Hypoglycemia. DIAGNOSIS  Women  A history and an exam are performed.  The discharge may be examined under a microscope.  A culture may be taken of the discharge. Men  A history and an exam are performed.  Any discharge from the penis or areas of cracked skin will be looked at under the microscope and cultured.  Stool samples may be cultured. TREATMENT  Women  Vaginal antifungal suppositories and creams.  Medicated creams to decrease irritation and itching on the outside of the vagina.  Warm compresses to the perineal area to decrease swelling and discomfort.  Oral antifungal medications.  Medicated vaginal suppositories or cream for repeated or recurrent infections.  Wash and dry the irritation areas before applying the cream.  Eating yogurt with lactobacillus may help with prevention and treatment.  Sometimes painting the vagina with gentian violet solution may help if creams and suppositories do not work. Men  Antifungal creams and oral antifungal medications.  Sometimes treatment must continue for 30 days after the symptoms go away to prevent recurrence. HOME CARE  INSTRUCTIONS  Women  Use cotton underwear and avoid tight-fitting clothing.  Avoid colored, scented toilet paper and deodorant tampons or pads.  Do not douche.  Keep your diabetes  under control.  Finish all the prescribed medications.  Keep your skin clean and dry.  Consume milk or yogurt with lactobacillus active culture regularly. If you get frequent yeast infections and think that is what the infection is, there are over-the-counter medications that you can get. If the infection does not show healing in 3 days, talk to your caregiver.  Tell your sex partner you have a yeast infection. Your partner may need treatment also, especially if your infection does not clear up or recurs. Men  Keep your skin clean and dry.  Keep your diabetes under control.  Finish all prescribed medications.  Tell your sex partner that you have a yeast infection so they can be treated if necessary. SEEK MEDICAL CARE IF:   Your symptoms do not clear up or worsen in one week after treatment.  You have an oral temperature above 102 F (38.9 C).  You have trouble swallowing or eating for a prolonged time.  You develop blisters on and around your vagina.  You develop vaginal bleeding and it is not your menstrual period.  You develop abdominal pain.  You develop intestinal problems as mentioned above.  You get weak or lightheaded.  You have painful or increased urination.  You have pain during sexual intercourse. MAKE SURE YOU:   Understand these instructions.  Will watch your condition.  Will get help right away if you are not doing well or get worse. Document Released: 04/11/2004 Document Revised: 05/27/2011 Document Reviewed: 07/24/2009 [facility] Patient Information 2014 [place].

## 2013-04-27 ENCOUNTER — Ambulatory Visit (INDEPENDENT_AMBULATORY_CARE_PROVIDER_SITE_OTHER): Payer: Medicare Other | Admitting: Interventional Cardiology

## 2013-04-27 ENCOUNTER — Encounter: Payer: Self-pay | Admitting: Interventional Cardiology

## 2013-04-27 VITALS — BP 132/72 | HR 60 | Ht 66.0 in | Wt 231.0 lb

## 2013-04-27 DIAGNOSIS — R609 Edema, unspecified: Secondary | ICD-10-CM | POA: Diagnosis not present

## 2013-04-27 DIAGNOSIS — E785 Hyperlipidemia, unspecified: Secondary | ICD-10-CM | POA: Diagnosis not present

## 2013-04-27 DIAGNOSIS — I2510 Atherosclerotic heart disease of native coronary artery without angina pectoris: Secondary | ICD-10-CM | POA: Diagnosis not present

## 2013-04-27 DIAGNOSIS — I10 Essential (primary) hypertension: Secondary | ICD-10-CM | POA: Diagnosis not present

## 2013-04-27 NOTE — Patient Instructions (Signed)
Your physician recommends that you return for a FASTING lipid profile: on 05/25/13.   Your physician wants you to follow-up in: 4 months with Dr. Irish Lack. You will receive a reminder letter in the mail two months in advance. If you don't receive a letter, please call our office to schedule the follow-up appointment.

## 2013-04-27 NOTE — Progress Notes (Signed)
Patient ID: [name], female   DOB: [birth year], 73 y.o.   MRN: [id number] Patient ID: [name], female   DOB: 28-Dec-1940, 73 y.o.   MRN: [id number]    [place], [address] [place], [place]  [address] Phone: [phone] Fax:  [phone]  Date:  04/27/2013   ID:  [name], [name] [birth year], MRN [id number]  PCP:  [name], MD      History of Present Illness: [name] is a 73 y.o. female who had an inferoposterior MI in January 2014. She underwent bare-metal stent placement to her circumflex. She tolerated the procedure well but then did develop chest discomfort and ventricular tachycardia later that night. She had a repeat cardiac catheterization the next day which revealed a patent circumflex stent. She stayed in the hospital for a few more days and went home. Since coming home, she reported some dyspnea on exertion which is more than usual. This improved after switching Brilinta to Effient.  She developed a rash, particularly on her back that itched.  She thinks it was related to the contrast and it resolved. No further chest discomfort. She has not used any nitroglycerin. She plans on participating in cardiac rehabilitation.  No sx lke her MI.  She had some weight gain which resolved with Lasix.     Wt Readings from Last 3 Encounters:  04/27/13 231 lb (104.781 kg)  04/24/13 229 lb 3.2 oz (103.964 kg)  04/07/13 229 lb 1.9 oz (103.928 kg)     Past Medical History  Diagnosis Date  . IV Contrast Allergy   . GERD (gastroesophageal reflux disease)   . Hypothyroidism   . Rosacea   . Hearing loss     a. s/p cochlear implant on right, hearing aid on left.  . Obesity   . Fibroids     a. uterine - spotting noted since 03/11/2013.  Marland Kitchen CAD (coronary artery disease)     a. 03/2013 Infpost STEMI/PCI: LM nl, LAD min irregs, LCX 100 (3.0x12 Vision BMS), RCA  mild ostial dzs, EF 55%.  . Essential hypertension, benign   . Ventricular tachycardia      a. Immediate post pci/MI - no complications, on BB.  Marland Kitchen Hyperlipidemia   . Myocardial infarction     Current Outpatient Prescriptions  Medication Sig Dispense Refill  . aspirin 81 MG tablet Take 81 mg by mouth daily.        Marland Kitchen atorvastatin (LIPITOR) 80 MG tablet Take 1 tablet (80 mg total) by mouth daily at 6 PM.  30 tablet  6  . Calcium Carb-Cholecalciferol (CALCIUM + D3) 600-200 MG-UNIT TABS Take 1 tablet by mouth daily.      Marland Kitchen esomeprazole (NEXIUM) 40 MG capsule Take 40 mg by mouth daily before breakfast.        . levothyroxine (SYNTHROID, LEVOTHROID) 100 MCG tablet Take 100 mcg by mouth daily before breakfast.      . losartan (COZAAR) 50 MG tablet Take 0.5 tablets (25 mg total) by mouth daily.      . metoprolol succinate (TOPROL XL) 25 MG 24 hr tablet Take 1 tablet (25 mg total) by mouth daily.  30 tablet  6  . nitroGLYCERIN (NITROSTAT) 0.4 MG SL tablet Place 1 tablet (0.4 mg total) under the tongue every 5 (five) minutes x 3 doses as needed for chest pain.  25 tablet  3  . nystatin cream (MYCOSTATIN) Apply 1 application topically 2 (two) times daily.  30 g  0  . omega-3 acid ethyl esters (LOVAZA) 1 G capsule Take 1 g by mouth daily.      . prasugrel (EFFIENT) 10 MG TABS tablet Take 1 tablet (10 mg total) by mouth daily.  30 tablet  6   No current facility-administered medications for this visit.    Allergies:    Allergies  Allergen Reactions  . Iohexol Hives    Social History:  The patient  reports that she has never smoked. She does not have any smokeless tobacco history on file. She reports that she does not drink alcohol or use illicit drugs.   Family History:  The patient's family history includes Cancer in her mother; Colon cancer in her cousin; Diabetes in her mother and sister; Heart disease in her mother; Heart failure in her mother; Hyperlipidemia in her sister; Hypertension in her mother; Other in her father. There is no history of Esophageal cancer or Stomach cancer.    ROS:  Please see the history of present illness.  No nausea, vomiting.  No fevers, chills.  No focal weakness.  No dysuria. Rash.   All other systems reviewed and negative.   PHYSICAL EXAM: VS:  BP 132/72  Pulse 60  Ht 5\' 6"  (1.676 m)  Wt 231 lb (104.781 kg)  BMI 37.30 kg/m2 Well nourished, well developed, in no acute distress HEENT: normal Neck: no JVD, no carotid bruits Cardiac:  normal S1, S2; RRR;  Lungs:  clear to auscultation bilaterally, no wheezing, rhonchi or rales Abd: soft, nontender, no hepatomegaly Ext: no edema, 2+ radial pulses bilaterally. Both radial entry sites are bruised, right greater than left Skin: warm and dry; macular, erythematous rash all over her back and sides of her legs Neuro:   no focal abnormalities noted      ASSESSMENT AND PLAN:  1. Rash: Resolved.  Shortness of breath: Resolved with Brilinta to Effient change. 2. Recent inferior MI: Bare-metal stent used due to some uterine bleeding that she's had.  Post procedure, she had ventricular tachycardia and underwent repeat cardiac catheterization which showed a widely patent stent.   No lightheadedness or syncope. The arrhythmia was likely related to her MI. Echocardiogram from the hospital was reviewed. Unfortunately, they're unable to estimate ejection fraction. From her ventriculogram, her ejection fraction appeared well preserved. 3. Hyperlipidemia: LDL 117 at the time of the MI.  Should improve with atorvastatin 80 mg daily. Will check lipids in the month. If LDL is much below 70, could reduce dose of atorvastatin. 4. Fluid overload/edema: This is improved. She will now take the furosemide as needed based on any weight gain or symptoms of fluid overload that she may have. She had one episode where is difficult to get her shoes on. This improved after taking a dose of Lasix.   5. Hypertension: Agree with reducing dose of ARB given some low blood pressure readings.  Signed, Mina Marble, MD,  [facility] 04/27/2013 4:29 PM

## 2013-05-13 ENCOUNTER — Ambulatory Visit (HOSPITAL_COMMUNITY): Payer: Medicare Other

## 2013-05-17 ENCOUNTER — Encounter (HOSPITAL_COMMUNITY): Payer: Medicare Other

## 2013-05-19 ENCOUNTER — Encounter (HOSPITAL_COMMUNITY): Payer: Medicare Other

## 2013-05-20 ENCOUNTER — Encounter (HOSPITAL_COMMUNITY)
Admission: RE | Admit: 2013-05-20 | Discharge: 2013-05-20 | Disposition: A | Discharge status: Home / Self Care | Payer: Medicare Other | Source: Ambulatory Visit | Attending: Interventional Cardiology | Admitting: Interventional Cardiology

## 2013-05-20 DIAGNOSIS — E669 Obesity, unspecified: Secondary | ICD-10-CM | POA: Insufficient documentation

## 2013-05-20 DIAGNOSIS — I252 Old myocardial infarction: Secondary | ICD-10-CM | POA: Insufficient documentation

## 2013-05-20 DIAGNOSIS — Z833 Family history of diabetes mellitus: Secondary | ICD-10-CM | POA: Insufficient documentation

## 2013-05-20 DIAGNOSIS — I2510 Atherosclerotic heart disease of native coronary artery without angina pectoris: Secondary | ICD-10-CM | POA: Insufficient documentation

## 2013-05-20 DIAGNOSIS — Z5189 Encounter for other specified aftercare: Secondary | ICD-10-CM | POA: Insufficient documentation

## 2013-05-20 DIAGNOSIS — R791 Abnormal coagulation profile: Secondary | ICD-10-CM | POA: Insufficient documentation

## 2013-05-20 DIAGNOSIS — I4729 Other ventricular tachycardia: Secondary | ICD-10-CM | POA: Insufficient documentation

## 2013-05-20 DIAGNOSIS — I10 Essential (primary) hypertension: Secondary | ICD-10-CM | POA: Insufficient documentation

## 2013-05-20 DIAGNOSIS — R0602 Shortness of breath: Secondary | ICD-10-CM | POA: Insufficient documentation

## 2013-05-20 DIAGNOSIS — R42 Dizziness and giddiness: Secondary | ICD-10-CM | POA: Insufficient documentation

## 2013-05-20 DIAGNOSIS — I472 Ventricular tachycardia, unspecified: Secondary | ICD-10-CM | POA: Insufficient documentation

## 2013-05-20 NOTE — Progress Notes (Signed)
Cardiac Rehab Medication Review by a Pharmacist  Does the patient  feel that his/her medications are working for him/her?  yes  Has the patient been experiencing any side effects to the medications prescribed?  no  Does the patient measure his/her own blood pressure or blood glucose at home?  yes   Does the patient have any problems obtaining medications due to transportation or finances?   no  Understanding of regimen: excellent Understanding of indications: excellent Potential of compliance: excellent    Pharmacist comments: Pleasant 73 yo F take all her medication every day, and has no present issues. She does state that she has a supply of lasix and Kdur at home that she only uses as needed depending on her weight. States she has not taken any in the last two weeks.    [name] M 05/20/2013 8:57 AM

## 2013-05-21 ENCOUNTER — Encounter (HOSPITAL_COMMUNITY): Payer: Medicare Other

## 2013-05-24 ENCOUNTER — Encounter (HOSPITAL_COMMUNITY)
Admission: RE | Admit: 2013-05-24 | Discharge: 2013-05-24 | Disposition: A | Discharge status: Home / Self Care | Payer: Medicare Other | Source: Ambulatory Visit | Attending: Interventional Cardiology | Admitting: Interventional Cardiology

## 2013-05-24 ENCOUNTER — Telehealth: Payer: Self-pay | Admitting: Cardiology

## 2013-05-24 DIAGNOSIS — I472 Ventricular tachycardia: Secondary | ICD-10-CM | POA: Diagnosis not present

## 2013-05-24 DIAGNOSIS — I2510 Atherosclerotic heart disease of native coronary artery without angina pectoris: Secondary | ICD-10-CM | POA: Diagnosis not present

## 2013-05-24 DIAGNOSIS — Z5189 Encounter for other specified aftercare: Secondary | ICD-10-CM | POA: Diagnosis not present

## 2013-05-24 DIAGNOSIS — E669 Obesity, unspecified: Secondary | ICD-10-CM | POA: Diagnosis not present

## 2013-05-24 DIAGNOSIS — I252 Old myocardial infarction: Secondary | ICD-10-CM | POA: Diagnosis not present

## 2013-05-24 DIAGNOSIS — R791 Abnormal coagulation profile: Secondary | ICD-10-CM | POA: Diagnosis not present

## 2013-05-24 DIAGNOSIS — R0602 Shortness of breath: Secondary | ICD-10-CM | POA: Diagnosis not present

## 2013-05-24 DIAGNOSIS — I10 Essential (primary) hypertension: Secondary | ICD-10-CM | POA: Diagnosis not present

## 2013-05-24 DIAGNOSIS — R42 Dizziness and giddiness: Secondary | ICD-10-CM | POA: Diagnosis not present

## 2013-05-24 DIAGNOSIS — Z833 Family history of diabetes mellitus: Secondary | ICD-10-CM | POA: Diagnosis not present

## 2013-05-24 DIAGNOSIS — I4729 Other ventricular tachycardia: Secondary | ICD-10-CM | POA: Diagnosis not present

## 2013-05-24 NOTE — Progress Notes (Addendum)
Dalisa came to cardiac rehab this afternoon for her first day of exercise. Telemetry rhythm Sinus. Entry blood pressure 116/84. Patient reported feeling lightheaded on the nustep.  Blood pressure 90/60. Patient given water recheck blood pressure 102/64.  Symptoms resolved. Orthostatic blood pressures checked post exercise. Lying blood pressure 130/74.  Sitting blood pressure 135/82. Zenora did report feeling a little light headed when changing from lying to sitting.  Standing blood pressure 134/84. Dr Irish Lack called and notified of patient symptoms. Dr Irish Lack discontinued Ms Hoffmeier's losartan. Patient instructed to notify if patients blood pressure is greater than 140/90 consistently.  No complaints upon exit from cardiac rehab. Will continue to monitor the patient throughout  the program.

## 2013-05-24 NOTE — Telephone Encounter (Signed)
[name] from Cardiac rehab called stating pt had dizziness while doing the stepper. Pts BP had dropped to 90/50. Pt notified over the phone to stop losartan and to monitor BP. Pt is to call if BP runs consistently above 140/90.

## 2013-05-25 ENCOUNTER — Other Ambulatory Visit (INDEPENDENT_AMBULATORY_CARE_PROVIDER_SITE_OTHER): Payer: Medicare Other

## 2013-05-25 DIAGNOSIS — E785 Hyperlipidemia, unspecified: Secondary | ICD-10-CM | POA: Diagnosis not present

## 2013-05-25 LAB — LIPID PANEL
CHOLESTEROL: 88 mg/dL (ref 0–200)
HDL: 38.1 mg/dL — AB (ref >39.00)
LDL Cholesterol: 42 mg/dL (ref 0–99)
Total CHOL/HDL Ratio: 2
Triglycerides: 41 mg/dL (ref 0.0–149.0)
VLDL: 8.2 mg/dL (ref 0.0–40.0)

## 2013-05-25 LAB — HEPATIC FUNCTION PANEL
ALBUMIN: 3.9 g/dL (ref 3.5–5.2)
ALK PHOS: 79 U/L (ref 39–117)
ALT: 32 U/L (ref 0–35)
AST: 30 U/L (ref 0–37)
Bilirubin, Direct: 0.2 mg/dL (ref 0.0–0.3)
Total Bilirubin: 1.1 mg/dL (ref 0.3–1.2)
Total Protein: 6.8 g/dL (ref 6.0–8.3)

## 2013-05-26 ENCOUNTER — Encounter (HOSPITAL_COMMUNITY)
Admission: RE | Admit: 2013-05-26 | Discharge: 2013-05-26 | Disposition: A | Discharge status: Home / Self Care | Payer: Medicare Other | Source: Ambulatory Visit | Attending: Interventional Cardiology | Admitting: Interventional Cardiology

## 2013-05-28 ENCOUNTER — Encounter (HOSPITAL_COMMUNITY)
Admission: RE | Admit: 2013-05-28 | Discharge: 2013-05-28 | Disposition: A | Discharge status: Home / Self Care | Payer: Medicare Other | Source: Ambulatory Visit | Attending: Interventional Cardiology | Admitting: Interventional Cardiology

## 2013-05-28 ENCOUNTER — Telehealth: Payer: Self-pay | Admitting: Cardiology

## 2013-05-28 DIAGNOSIS — E785 Hyperlipidemia, unspecified: Secondary | ICD-10-CM

## 2013-05-28 MED ORDER — ATORVASTATIN CALCIUM 80 MG PO TABS: 40.0000 mg | ORAL_TABLET | Freq: Every day | ORAL | Status: DC

## 2013-05-28 NOTE — Telephone Encounter (Signed)
Message copied by Alcario Drought on Fri May 28, 2013 11:52 AM ------      Message from: Jettie Booze      Created: Fri May 28, 2013  1:24 AM       Ok to decrease atorva to 40 mg daily. Recheck statin panel in 3 months ------

## 2013-05-28 NOTE — Telephone Encounter (Signed)
Pt notified, meds updated and labs ordered.  

## 2013-05-31 ENCOUNTER — Encounter (HOSPITAL_COMMUNITY)
Admission: RE | Admit: 2013-05-31 | Discharge: 2013-05-31 | Disposition: A | Discharge status: Home / Self Care | Payer: Medicare Other | Source: Ambulatory Visit | Attending: Interventional Cardiology | Admitting: Interventional Cardiology

## 2013-05-31 NOTE — Progress Notes (Signed)
Reviewed home exercise with pt today.  Pt plans to walk at home for exercise.  Reviewed THR, pulse, RPE, sign and symptoms, NTG use, and when to call 911 or MD.  Pt voiced understanding. Jessica Hawkins, MA, ACSM RCEP   

## 2013-06-02 ENCOUNTER — Encounter (HOSPITAL_COMMUNITY)
Admission: RE | Admit: 2013-06-02 | Discharge: 2013-06-02 | Disposition: A | Discharge status: Home / Self Care | Payer: Medicare Other | Source: Ambulatory Visit | Attending: Interventional Cardiology | Admitting: Interventional Cardiology

## 2013-06-04 ENCOUNTER — Encounter (HOSPITAL_COMMUNITY)
Admission: RE | Admit: 2013-06-04 | Discharge: 2013-06-04 | Disposition: A | Discharge status: Home / Self Care | Payer: Medicare Other | Source: Ambulatory Visit | Attending: Interventional Cardiology | Admitting: Interventional Cardiology

## 2013-06-07 ENCOUNTER — Encounter (HOSPITAL_COMMUNITY)
Admission: RE | Admit: 2013-06-07 | Discharge: 2013-06-07 | Disposition: A | Discharge status: Home / Self Care | Payer: Medicare Other | Source: Ambulatory Visit | Attending: Interventional Cardiology | Admitting: Interventional Cardiology

## 2013-06-09 ENCOUNTER — Encounter (HOSPITAL_COMMUNITY)
Admission: RE | Admit: 2013-06-09 | Discharge: 2013-06-09 | Disposition: A | Discharge status: Home / Self Care | Payer: Medicare Other | Source: Ambulatory Visit | Attending: Interventional Cardiology | Admitting: Interventional Cardiology

## 2013-06-09 NOTE — Progress Notes (Signed)
[name] 73 y.o. female Nutrition Note Spoke with pt.  Nutrition Survey reviewed with pt. Pt is following Step 2 of the Therapeutic Lifestyle Changes diet. Pt wants to lose wt. Pt has been trying to lose wt by "changing my diet." Pt reports UBW prior to hospitalization was 240 lb. Pt wt today 222 lb (100.9 kg), which is down 4 lb (1.8 kg) over the past few weeks and down 18 lb over the past 3 months. Wt loss tips reviewed. Pt is pre-diabetic according to her last A1c. Pt reports family h/o DM. Pt has a glucometer and checks her fasting CBG's "once a week or so." Pt encouraged to talk with her PCP re: pre-diabetes. Pt expressed understanding of the information reviewed. Pt aware of nutrition education classes offered and plans on attending nutrition classes. Vitals - 1 value per visit 05/20/2013 04/27/2013 04/24/2013 04/07/2013 03/31/2013  Weight (lb) 226.41 231 229.2 229.12 237   Vitals - 1 value per visit 03/28/2013  Weight (lb) 240.1   Nutrition Diagnosis   Food-and nutrition-related knowledge deficit related to lack of exposure to information as related to diagnosis of: ? CVD ? Pre-DM (A1c 5.9)    Obesity related to excessive energy intake as evidenced by a BMI of 36.8  Nutrition Intervention   Benefits of adopting Therapeutic Lifestyle Changes discussed when Medficts reviewed.   Pt to attend the Portion Distortion class   Pt to attend the  ? Nutrition I class                     ? Nutrition II class   Continue client-centered nutrition education by RD, as part of interdisciplinary care.  Goal(s)   Pt to identify food quantities necessary to achieve: ? wt loss to a goal wt of 202-220 lb (91.8-100.0 kg) at graduation from cardiac rehab.   Monitor and Evaluate progress toward nutrition goal with team. Nutrition Risk:  Low   Derek Mound, M.Ed, RD, LDN, CDE 06/09/2013 3:54 PM

## 2013-06-11 ENCOUNTER — Encounter (HOSPITAL_COMMUNITY)
Admission: RE | Admit: 2013-06-11 | Discharge: 2013-06-11 | Disposition: A | Discharge status: Home / Self Care | Payer: Medicare Other | Source: Ambulatory Visit | Attending: Interventional Cardiology | Admitting: Interventional Cardiology

## 2013-06-11 ENCOUNTER — Encounter: Payer: Self-pay | Admitting: Interventional Cardiology

## 2013-06-14 ENCOUNTER — Encounter (HOSPITAL_COMMUNITY)
Admission: RE | Admit: 2013-06-14 | Discharge: 2013-06-14 | Disposition: A | Discharge status: Home / Self Care | Payer: Medicare Other | Source: Ambulatory Visit | Attending: Interventional Cardiology | Admitting: Interventional Cardiology

## 2013-06-14 ENCOUNTER — Telehealth: Payer: Self-pay | Admitting: Cardiology

## 2013-06-14 DIAGNOSIS — R0989 Other specified symptoms and signs involving the circulatory and respiratory systems: Principal | ICD-10-CM

## 2013-06-14 DIAGNOSIS — R0609 Other forms of dyspnea: Secondary | ICD-10-CM

## 2013-06-14 DIAGNOSIS — I2510 Atherosclerotic heart disease of native coronary artery without angina pectoris: Secondary | ICD-10-CM

## 2013-06-14 DIAGNOSIS — I2119 ST elevation (STEMI) myocardial infarction involving other coronary artery of inferior wall: Secondary | ICD-10-CM

## 2013-06-14 NOTE — Telephone Encounter (Addendum)
[name] from Cardiac Rehab called stating pt c/o of SOB 4/10. Pt did initially feel less SOB after switching from Brilinta to Effient, but then she started to have SOB like she had when she was on the Brilinta and it has continued. Pt feels SOB all the time and sometimes she feels that she cant take a full breath. Pt is exercising at Cardiac rehab today and she denies CP.

## 2013-06-14 NOTE — Telephone Encounter (Signed)
Per Dr. Irish Lack order echo. Spoke with Verdis Frederickson and she is aware and she will let pt know someone will call her from our office to give her appt time and date.

## 2013-06-14 NOTE — Progress Notes (Addendum)
Anberlin reports that she continues to feel short of breath since her Pattricia Boss was changed to effient. Oxygen saturation 97% on room air. Lung fields clear upon auscultation. Patient denies any swelling. Dr Hassell Done office called and notified spoke with [facility] CMA. Amy updated Dr Irish Lack.  Dr Hassell Done office will call the patient to schedule an echocardiogram. Dr Irish Lack said Ms Rubalcava is okay to return to exercise on Wednesday. Will fax exercise flow sheets to Dr. Hassell Done office for review from cardiac rehab.

## 2013-06-16 ENCOUNTER — Encounter (HOSPITAL_COMMUNITY)
Admission: RE | Admit: 2013-06-16 | Discharge: 2013-06-16 | Disposition: A | Discharge status: Home / Self Care | Payer: Medicare Other | Source: Ambulatory Visit | Attending: Interventional Cardiology | Admitting: Interventional Cardiology

## 2013-06-16 DIAGNOSIS — E669 Obesity, unspecified: Secondary | ICD-10-CM | POA: Diagnosis not present

## 2013-06-16 DIAGNOSIS — I472 Ventricular tachycardia, unspecified: Secondary | ICD-10-CM | POA: Insufficient documentation

## 2013-06-16 DIAGNOSIS — Z833 Family history of diabetes mellitus: Secondary | ICD-10-CM | POA: Diagnosis not present

## 2013-06-16 DIAGNOSIS — R0602 Shortness of breath: Secondary | ICD-10-CM | POA: Diagnosis not present

## 2013-06-16 DIAGNOSIS — I4729 Other ventricular tachycardia: Secondary | ICD-10-CM | POA: Insufficient documentation

## 2013-06-16 DIAGNOSIS — R42 Dizziness and giddiness: Secondary | ICD-10-CM | POA: Insufficient documentation

## 2013-06-16 DIAGNOSIS — I2510 Atherosclerotic heart disease of native coronary artery without angina pectoris: Secondary | ICD-10-CM | POA: Diagnosis not present

## 2013-06-16 DIAGNOSIS — I252 Old myocardial infarction: Secondary | ICD-10-CM | POA: Insufficient documentation

## 2013-06-16 DIAGNOSIS — R791 Abnormal coagulation profile: Secondary | ICD-10-CM | POA: Insufficient documentation

## 2013-06-16 DIAGNOSIS — I10 Essential (primary) hypertension: Secondary | ICD-10-CM | POA: Diagnosis not present

## 2013-06-16 DIAGNOSIS — Z5189 Encounter for other specified aftercare: Secondary | ICD-10-CM | POA: Insufficient documentation

## 2013-06-18 ENCOUNTER — Encounter (HOSPITAL_COMMUNITY): Payer: Medicare Other

## 2013-06-18 ENCOUNTER — Ambulatory Visit (HOSPITAL_COMMUNITY): Payer: Medicare Other | Attending: Interventional Cardiology | Admitting: Radiology

## 2013-06-18 DIAGNOSIS — I2510 Atherosclerotic heart disease of native coronary artery without angina pectoris: Secondary | ICD-10-CM

## 2013-06-18 DIAGNOSIS — I2119 ST elevation (STEMI) myocardial infarction involving other coronary artery of inferior wall: Secondary | ICD-10-CM | POA: Insufficient documentation

## 2013-06-18 DIAGNOSIS — R0989 Other specified symptoms and signs involving the circulatory and respiratory systems: Secondary | ICD-10-CM | POA: Insufficient documentation

## 2013-06-18 DIAGNOSIS — R0609 Other forms of dyspnea: Secondary | ICD-10-CM | POA: Insufficient documentation

## 2013-06-18 DIAGNOSIS — R0602 Shortness of breath: Secondary | ICD-10-CM | POA: Diagnosis not present

## 2013-06-18 NOTE — Progress Notes (Signed)
Echocardiogram performed.  

## 2013-06-21 ENCOUNTER — Encounter (HOSPITAL_COMMUNITY)
Admission: RE | Admit: 2013-06-21 | Discharge: 2013-06-21 | Disposition: A | Discharge status: Home / Self Care | Payer: Medicare Other | Source: Ambulatory Visit | Attending: Interventional Cardiology | Admitting: Interventional Cardiology

## 2013-06-22 ENCOUNTER — Other Ambulatory Visit: Payer: Self-pay | Admitting: Family Medicine

## 2013-06-23 ENCOUNTER — Encounter (HOSPITAL_COMMUNITY)
Admission: RE | Admit: 2013-06-23 | Discharge: 2013-06-23 | Disposition: A | Discharge status: Home / Self Care | Payer: Medicare Other | Source: Ambulatory Visit | Attending: Interventional Cardiology | Admitting: Interventional Cardiology

## 2013-06-23 ENCOUNTER — Telehealth: Payer: Self-pay | Admitting: Interventional Cardiology

## 2013-06-23 NOTE — Telephone Encounter (Signed)
Dr Tamala Julian, do you want to RF this, or pt RTC for recheck?

## 2013-06-23 NOTE — Telephone Encounter (Signed)
returned maria's call to speak with pt about echo results.

## 2013-06-23 NOTE — Telephone Encounter (Signed)
New message    [name] calling from cardiac rehab . Patient is there now asking for echo results.

## 2013-06-25 ENCOUNTER — Encounter (HOSPITAL_COMMUNITY)
Admission: RE | Admit: 2013-06-25 | Discharge: 2013-06-25 | Disposition: A | Discharge status: Home / Self Care | Payer: Medicare Other | Source: Ambulatory Visit | Attending: Interventional Cardiology | Admitting: Interventional Cardiology

## 2013-06-28 ENCOUNTER — Encounter (HOSPITAL_COMMUNITY)
Admission: RE | Admit: 2013-06-28 | Discharge: 2013-06-28 | Disposition: A | Discharge status: Home / Self Care | Payer: Medicare Other | Source: Ambulatory Visit | Attending: Interventional Cardiology | Admitting: Interventional Cardiology

## 2013-06-29 ENCOUNTER — Other Ambulatory Visit (HOSPITAL_COMMUNITY): Payer: Medicare Other

## 2013-06-30 ENCOUNTER — Encounter (HOSPITAL_COMMUNITY)
Admission: RE | Admit: 2013-06-30 | Discharge: 2013-06-30 | Disposition: A | Discharge status: Home / Self Care | Payer: Medicare Other | Source: Ambulatory Visit | Attending: Interventional Cardiology | Admitting: Interventional Cardiology

## 2013-07-01 DIAGNOSIS — E039 Hypothyroidism, unspecified: Secondary | ICD-10-CM | POA: Diagnosis not present

## 2013-07-01 DIAGNOSIS — Z6835 Body mass index (BMI) 35.0-35.9, adult: Secondary | ICD-10-CM | POA: Diagnosis not present

## 2013-07-01 DIAGNOSIS — E785 Hyperlipidemia, unspecified: Secondary | ICD-10-CM | POA: Diagnosis not present

## 2013-07-01 DIAGNOSIS — I10 Essential (primary) hypertension: Secondary | ICD-10-CM | POA: Diagnosis not present

## 2013-07-01 DIAGNOSIS — I219 Acute myocardial infarction, unspecified: Secondary | ICD-10-CM | POA: Diagnosis not present

## 2013-07-02 ENCOUNTER — Encounter (HOSPITAL_COMMUNITY)
Admission: RE | Admit: 2013-07-02 | Discharge: 2013-07-02 | Disposition: A | Discharge status: Home / Self Care | Payer: Medicare Other | Source: Ambulatory Visit | Attending: Interventional Cardiology | Admitting: Interventional Cardiology

## 2013-07-02 NOTE — Progress Notes (Signed)
Erline says she recently saw Dr Reynaldo Minium and told him she is still experiencing shortness of breath. Shaquia says Dr Reynaldo Minium discontinued her Toprol and switched her to coreg twice a day she thinks the dose is 6.25 mg. Midge will bring in the actual dosage when she returns to exercise on Monday.

## 2013-07-05 ENCOUNTER — Encounter (HOSPITAL_COMMUNITY)
Admission: RE | Admit: 2013-07-05 | Discharge: 2013-07-05 | Disposition: A | Discharge status: Home / Self Care | Payer: Medicare Other | Source: Ambulatory Visit | Attending: Interventional Cardiology | Admitting: Interventional Cardiology

## 2013-07-05 NOTE — Progress Notes (Signed)
[name] says she took one dose of the coreg that Dr Reynaldo Minium prescribed it made her feel funny and made her vision blurry.  [name] said after Friday she restarted her Toprol XL.  Dr Hassell Done office called and notified. Dr Jacquiline Doe office also called and notified.  [name] still reports having shortness of breath despite the Brillinta being changed to effient.  Dr Irish Lack office also notified of patient's complaint.

## 2013-07-06 ENCOUNTER — Encounter: Payer: Self-pay | Admitting: Interventional Cardiology

## 2013-07-07 ENCOUNTER — Encounter (HOSPITAL_COMMUNITY)
Admission: RE | Admit: 2013-07-07 | Discharge: 2013-07-07 | Disposition: A | Discharge status: Home / Self Care | Payer: Medicare Other | Source: Ambulatory Visit | Attending: Interventional Cardiology | Admitting: Interventional Cardiology

## 2013-07-09 ENCOUNTER — Encounter (HOSPITAL_COMMUNITY)
Admission: RE | Admit: 2013-07-09 | Discharge: 2013-07-09 | Disposition: A | Discharge status: Home / Self Care | Payer: Medicare Other | Source: Ambulatory Visit | Attending: Interventional Cardiology | Admitting: Interventional Cardiology

## 2013-07-12 ENCOUNTER — Encounter (HOSPITAL_COMMUNITY)
Admission: RE | Admit: 2013-07-12 | Discharge: 2013-07-12 | Disposition: A | Discharge status: Home / Self Care | Payer: Medicare Other | Source: Ambulatory Visit | Attending: Interventional Cardiology | Admitting: Interventional Cardiology

## 2013-07-13 ENCOUNTER — Telehealth: Payer: Self-pay | Admitting: Cardiology

## 2013-07-13 DIAGNOSIS — R0602 Shortness of breath: Secondary | ICD-10-CM

## 2013-07-13 NOTE — Telephone Encounter (Signed)
Message copied by Alcario Drought on Tue Jul 13, 2013  3:39 PM ------      Message from: Jettie Booze      Created: Tue Jul 13, 2013  3:36 PM      Regarding: FW: shortness of breath       Please arrange for pulmonary function tests if these have not been done.            JV      ----- Message -----         From: Magda Kiel, RN         Sent: 07/05/2013   3:17 PM           To: Jettie Booze, MD, Sherra Kimmons Brett Albino, CMA      Subject: shortness of breath                                      Dr Irish Lack,            I want to bring it to your attention that Ms Wenzler continues to complain of shortness of breath despite you changing her Brillinta to Effient. Her recent echo was normal. Pamala Hurry saw Dr Reynaldo Minium last week he changed her Toprol to coreg. Stephenie took one dose of coreg it made her feel bad she switched back Toprol this past Saturday. Do you want to see her in the office to discuss her shortness of breath before her scheduled appointment in June?            Thanks for your assistance,            Verdis Frederickson        ------

## 2013-07-14 ENCOUNTER — Encounter (HOSPITAL_COMMUNITY)
Admission: RE | Admit: 2013-07-14 | Discharge: 2013-07-14 | Disposition: A | Discharge status: Home / Self Care | Payer: Medicare Other | Source: Ambulatory Visit | Attending: Interventional Cardiology | Admitting: Interventional Cardiology

## 2013-07-14 NOTE — Telephone Encounter (Signed)
PFT ordered, lmtrc

## 2013-07-15 NOTE — Telephone Encounter (Signed)
No answer no vm

## 2013-07-16 ENCOUNTER — Encounter (HOSPITAL_COMMUNITY)
Admission: RE | Admit: 2013-07-16 | Discharge: 2013-07-16 | Disposition: A | Discharge status: Home / Self Care | Payer: Medicare Other | Source: Ambulatory Visit | Attending: Interventional Cardiology | Admitting: Interventional Cardiology

## 2013-07-16 DIAGNOSIS — I252 Old myocardial infarction: Secondary | ICD-10-CM | POA: Diagnosis not present

## 2013-07-16 DIAGNOSIS — I472 Ventricular tachycardia, unspecified: Secondary | ICD-10-CM | POA: Insufficient documentation

## 2013-07-16 DIAGNOSIS — Z833 Family history of diabetes mellitus: Secondary | ICD-10-CM | POA: Insufficient documentation

## 2013-07-16 DIAGNOSIS — R791 Abnormal coagulation profile: Secondary | ICD-10-CM | POA: Insufficient documentation

## 2013-07-16 DIAGNOSIS — I2510 Atherosclerotic heart disease of native coronary artery without angina pectoris: Secondary | ICD-10-CM | POA: Insufficient documentation

## 2013-07-16 DIAGNOSIS — I4729 Other ventricular tachycardia: Secondary | ICD-10-CM | POA: Diagnosis not present

## 2013-07-16 DIAGNOSIS — Z5189 Encounter for other specified aftercare: Secondary | ICD-10-CM | POA: Diagnosis not present

## 2013-07-16 DIAGNOSIS — R42 Dizziness and giddiness: Secondary | ICD-10-CM | POA: Insufficient documentation

## 2013-07-16 DIAGNOSIS — E669 Obesity, unspecified: Secondary | ICD-10-CM | POA: Diagnosis not present

## 2013-07-16 DIAGNOSIS — R0602 Shortness of breath: Secondary | ICD-10-CM | POA: Diagnosis not present

## 2013-07-16 DIAGNOSIS — I10 Essential (primary) hypertension: Secondary | ICD-10-CM | POA: Insufficient documentation

## 2013-07-19 ENCOUNTER — Encounter (HOSPITAL_COMMUNITY): Payer: Medicare Other

## 2013-07-19 NOTE — Telephone Encounter (Signed)
Pt notified. [facility] will call to schedule.

## 2013-07-21 ENCOUNTER — Encounter (HOSPITAL_COMMUNITY)
Admission: RE | Admit: 2013-07-21 | Discharge: 2013-07-21 | Disposition: A | Discharge status: Home / Self Care | Payer: Medicare Other | Source: Ambulatory Visit | Attending: Interventional Cardiology | Admitting: Interventional Cardiology

## 2013-07-21 NOTE — Progress Notes (Signed)
Remy is scheduled for PFT's tomorrow. Doing well with exercise at cardiac rehab

## 2013-07-22 ENCOUNTER — Ambulatory Visit (HOSPITAL_COMMUNITY)
Admission: RE | Admit: 2013-07-22 | Discharge: 2013-07-22 | Disposition: A | Discharge status: Home / Self Care | Payer: Medicare Other | Source: Ambulatory Visit | Attending: Interventional Cardiology | Admitting: Interventional Cardiology

## 2013-07-22 ENCOUNTER — Inpatient Hospital Stay (HOSPITAL_COMMUNITY)
Admission: RE | Admit: 2013-07-22 | Discharge: 2013-07-22 | Disposition: A | Discharge status: Home / Self Care | Payer: Medicare Other | Source: Ambulatory Visit

## 2013-07-22 DIAGNOSIS — R0602 Shortness of breath: Secondary | ICD-10-CM | POA: Diagnosis not present

## 2013-07-22 MED ORDER — ALBUTEROL SULFATE (2.5 MG/3ML) 0.083% IN NEBU
2.5000 mg | INHALATION_SOLUTION | Freq: Once | RESPIRATORY_TRACT | Status: AC
  Administered 2013-07-22: 2.5 mg via RESPIRATORY_TRACT

## 2013-07-23 ENCOUNTER — Encounter (HOSPITAL_COMMUNITY)
Admission: RE | Admit: 2013-07-23 | Discharge: 2013-07-23 | Disposition: A | Discharge status: Home / Self Care | Payer: Medicare Other | Source: Ambulatory Visit | Attending: Interventional Cardiology | Admitting: Interventional Cardiology

## 2013-07-26 ENCOUNTER — Encounter (HOSPITAL_COMMUNITY)
Admission: RE | Admit: 2013-07-26 | Discharge: 2013-07-26 | Disposition: A | Discharge status: Home / Self Care | Payer: Medicare Other | Source: Ambulatory Visit | Attending: Interventional Cardiology | Admitting: Interventional Cardiology

## 2013-07-27 ENCOUNTER — Telehealth: Payer: Self-pay | Admitting: Cardiology

## 2013-07-27 DIAGNOSIS — J984 Other disorders of lung: Secondary | ICD-10-CM

## 2013-07-27 NOTE — Telephone Encounter (Signed)
Message copied by Alcario Drought on Tue Jul 27, 2013  9:14 AM ------      Message from: Jettie Booze      Created: Thu Jul 22, 2013  2:21 PM       Question of some scarring of the lungs.  Please refer to pulmonary. ------

## 2013-07-27 NOTE — Telephone Encounter (Signed)
Referral sent. Pt aware. °

## 2013-07-28 ENCOUNTER — Encounter (HOSPITAL_COMMUNITY)
Admission: RE | Admit: 2013-07-28 | Discharge: 2013-07-28 | Disposition: A | Discharge status: Home / Self Care | Payer: Medicare Other | Source: Ambulatory Visit | Attending: Interventional Cardiology | Admitting: Interventional Cardiology

## 2013-07-29 LAB — PULMONARY FUNCTION TEST
DL/VA % PRED: 94 %
DL/VA: 4.75 ml/min/mmHg/L
DLCO unc % pred: 67 %
DLCO unc: 18.29 ml/min/mmHg
FEF 25-75 POST: 2.18 L/s
FEF 25-75 Pre: 2.1 L/sec
FEF2575-%CHANGE-POST: 3 %
FEF2575-%PRED-PRE: 106 %
FEF2575-%Pred-Post: 109 %
FEV1-%Change-Post: 0 %
FEV1-%PRED-POST: 90 %
FEV1-%Pred-Pre: 90 %
FEV1-Post: 2.22 L
FEV1-Pre: 2.2 L
FEV1FVC-%CHANGE-POST: 2 %
FEV1FVC-%Pred-Pre: 103 %
FEV6-%CHANGE-POST: -1 %
FEV6-%PRED-PRE: 90 %
FEV6-%Pred-Post: 89 %
FEV6-PRE: 2.78 L
FEV6-Post: 2.74 L
FEV6FVC-%Change-Post: 0 %
FEV6FVC-%PRED-POST: 104 %
FEV6FVC-%Pred-Pre: 104 %
FVC-%Change-Post: -1 %
FVC-%Pred-Post: 85 %
FVC-%Pred-Pre: 86 %
FVC-PRE: 2.79 L
FVC-Post: 2.74 L
PRE FEV1/FVC RATIO: 79 %
PRE FEV6/FVC RATIO: 100 %
Post FEV1/FVC ratio: 81 %
Post FEV6/FVC ratio: 100 %
RV % PRED: 63 %
RV: 1.46 L
TLC % pred: 77 %
TLC: 4.17 L

## 2013-07-30 ENCOUNTER — Encounter: Payer: Self-pay | Admitting: Pulmonary Disease

## 2013-07-30 ENCOUNTER — Encounter (HOSPITAL_COMMUNITY)
Admission: RE | Admit: 2013-07-30 | Discharge: 2013-07-30 | Disposition: A | Discharge status: Home / Self Care | Payer: Medicare Other | Source: Ambulatory Visit | Attending: Interventional Cardiology | Admitting: Interventional Cardiology

## 2013-07-30 ENCOUNTER — Ambulatory Visit (INDEPENDENT_AMBULATORY_CARE_PROVIDER_SITE_OTHER): Payer: Medicare Other | Admitting: Pulmonary Disease

## 2013-07-30 VITALS — BP 126/88 | HR 72 | Temp 97.7°F | Ht 66.0 in | Wt 214.8 lb

## 2013-07-30 DIAGNOSIS — R0989 Other specified symptoms and signs involving the circulatory and respiratory systems: Secondary | ICD-10-CM

## 2013-07-30 DIAGNOSIS — R0609 Other forms of dyspnea: Secondary | ICD-10-CM | POA: Diagnosis not present

## 2013-07-30 NOTE — Progress Notes (Signed)
   Subjective:    Patient ID: [name], female    DOB: October 02, 1940, 73 y.o.   MRN: [id number]  HPI The patient is a 73 year old female who I've been asked to see for dyspnea on exertion. She tells me that she was in her usual state of health with no pulmonary issues and no shortness of breath and AA myocardial infarction in January of this year. She underwent a percutaneous intervention with stenting, and initially did very well. She then began to develop worsening shortness of breath, and this has persisted despite losing 30 pounds in participate in cardiac rehabilitation. The patient states that she can get winded walking up one flight of stairs or bringing groceries in from the car. She also gets winded with light housework or even taking a shower. To breathing is typically worse with hot environments and high humidity. She denies any lower extremity edema or cough. She has had a chest x-ray which shows minimal hyperinflation, and mild prominence of her bronchovascular markings. She has had pulmonary function studies that showed no airflow obstruction, very mild restriction, and a mild reduction in her diffusion capacity that corrects with alveolar volume adjustment. She has no history that is suggestive of thromboembolic disease.    Review of Systems  Constitutional: Negative for fever and unexpected weight change.  HENT: Negative for congestion, dental problem, ear pain, nosebleeds, postnasal drip, rhinorrhea, sinus pressure, sneezing, sore throat and trouble swallowing.   Eyes: Negative for redness and itching.  Respiratory: Positive for shortness of breath. Negative for cough, chest tightness and wheezing.   Cardiovascular: Negative for palpitations and leg swelling.  Gastrointestinal: Negative for nausea and vomiting.  Genitourinary: Negative for dysuria.  Musculoskeletal: Negative for joint swelling.  Skin: Negative for rash.  Neurological: Negative for headaches.  Hematological:  Does not bruise/bleed easily.  Psychiatric/Behavioral: Negative for dysphoric mood. The patient is not nervous/anxious.        Objective:   Physical Exam Constitutional:  Overweight female, no acute distress  HENT:  Nares patent without discharge  Oropharynx without exudate, palate and uvula are normal  Eyes:  Perrla, eomi, no scleral icterus  Neck:  No JVD, no TMG  Cardiovascular:  Normal rate, regular rhythm, no rubs or gallops.  2/6 sem        Intact distal pulses  Pulmonary :  Normal breath sounds, no stridor or respiratory distress   No rales, rhonchi, or wheezing  Abdominal:  Soft, nondistended, bowel sounds present.  No tenderness noted.   Musculoskeletal:  minimal lower extremity edema noted, +varicosities.   Lymph Nodes:  No cervical lymphadenopathy noted  Skin:  No cyanosis noted  Neurologic:  Alert, appropriate, moves all 4 extremities without obvious deficit.         Assessment & Plan:

## 2013-07-30 NOTE — Assessment & Plan Note (Signed)
The patient is complaining of significant dyspnea on exertion after her recent myocardial infarction with percutaneous intervention. She did not have any issues with this prior to her cardiac event. She has continued to have shortness of breath despite losing 30 pounds in participate in in cardiac rehabilitation. Her chest x-ray and exam are unimpressive, and her PFTs showed no airflow obstruction by spirometry, minimal restriction by total lung capacity, and a mild reduction in her DLCO that corrects with alveolar volume adjustment. I see nothing from a pulmonary perspective to explain her symptoms. She is very frustrated with her persistent symptoms, and I have offered to do a cardiopulmonary exercise test for further evaluation. The other option is for her to continue working on aggressive weight loss and conditioning. At this point, the patient would like to think about her various options, and let me know if she wishes to pursue further testing.

## 2013-07-30 NOTE — Patient Instructions (Signed)
Keep working on weight loss and exercise program. Will schedule for a cardiopulmonary exercise test if you wish to continue looking for subtle sources of your shortness of breath.

## 2013-08-02 ENCOUNTER — Encounter (HOSPITAL_COMMUNITY)
Admission: RE | Admit: 2013-08-02 | Discharge: 2013-08-02 | Disposition: A | Discharge status: Home / Self Care | Payer: Medicare Other | Source: Ambulatory Visit | Attending: Interventional Cardiology | Admitting: Interventional Cardiology

## 2013-08-04 ENCOUNTER — Encounter (HOSPITAL_COMMUNITY)
Admission: RE | Admit: 2013-08-04 | Discharge: 2013-08-04 | Disposition: A | Discharge status: Home / Self Care | Payer: Medicare Other | Source: Ambulatory Visit | Attending: Interventional Cardiology | Admitting: Interventional Cardiology

## 2013-08-05 ENCOUNTER — Institutional Professional Consult (permissible substitution): Payer: Medicare Other | Admitting: Emergency Medicine

## 2013-08-06 ENCOUNTER — Encounter (HOSPITAL_COMMUNITY)
Admission: RE | Admit: 2013-08-06 | Discharge: 2013-08-06 | Disposition: A | Discharge status: Home / Self Care | Payer: Medicare Other | Source: Ambulatory Visit | Attending: Interventional Cardiology | Admitting: Interventional Cardiology

## 2013-08-09 ENCOUNTER — Encounter (HOSPITAL_COMMUNITY): Payer: Medicare Other

## 2013-08-11 ENCOUNTER — Encounter (HOSPITAL_COMMUNITY)
Admission: RE | Admit: 2013-08-11 | Discharge: 2013-08-11 | Disposition: A | Discharge status: Home / Self Care | Payer: Medicare Other | Source: Ambulatory Visit | Attending: Interventional Cardiology | Admitting: Interventional Cardiology

## 2013-08-13 ENCOUNTER — Encounter (HOSPITAL_COMMUNITY)
Admission: RE | Admit: 2013-08-13 | Discharge: 2013-08-13 | Disposition: A | Discharge status: Home / Self Care | Payer: Medicare Other | Source: Ambulatory Visit | Attending: Interventional Cardiology | Admitting: Interventional Cardiology

## 2013-08-16 ENCOUNTER — Encounter (HOSPITAL_COMMUNITY)
Admission: RE | Admit: 2013-08-16 | Discharge: 2013-08-16 | Disposition: A | Discharge status: Home / Self Care | Payer: Medicare Other | Source: Ambulatory Visit | Attending: Interventional Cardiology | Admitting: Interventional Cardiology

## 2013-08-16 DIAGNOSIS — E669 Obesity, unspecified: Secondary | ICD-10-CM | POA: Insufficient documentation

## 2013-08-16 DIAGNOSIS — I472 Ventricular tachycardia, unspecified: Secondary | ICD-10-CM | POA: Insufficient documentation

## 2013-08-16 DIAGNOSIS — Z5189 Encounter for other specified aftercare: Secondary | ICD-10-CM | POA: Insufficient documentation

## 2013-08-16 DIAGNOSIS — R0602 Shortness of breath: Secondary | ICD-10-CM | POA: Diagnosis not present

## 2013-08-16 DIAGNOSIS — R791 Abnormal coagulation profile: Secondary | ICD-10-CM | POA: Insufficient documentation

## 2013-08-16 DIAGNOSIS — Z833 Family history of diabetes mellitus: Secondary | ICD-10-CM | POA: Diagnosis not present

## 2013-08-16 DIAGNOSIS — I2510 Atherosclerotic heart disease of native coronary artery without angina pectoris: Secondary | ICD-10-CM | POA: Diagnosis not present

## 2013-08-16 DIAGNOSIS — I10 Essential (primary) hypertension: Secondary | ICD-10-CM | POA: Diagnosis not present

## 2013-08-16 DIAGNOSIS — I4729 Other ventricular tachycardia: Secondary | ICD-10-CM | POA: Diagnosis not present

## 2013-08-16 DIAGNOSIS — R42 Dizziness and giddiness: Secondary | ICD-10-CM | POA: Diagnosis not present

## 2013-08-16 DIAGNOSIS — I252 Old myocardial infarction: Secondary | ICD-10-CM | POA: Insufficient documentation

## 2013-08-18 ENCOUNTER — Encounter (HOSPITAL_COMMUNITY)
Admission: RE | Admit: 2013-08-18 | Discharge: 2013-08-18 | Disposition: A | Discharge status: Home / Self Care | Payer: Medicare Other | Source: Ambulatory Visit | Attending: Interventional Cardiology | Admitting: Interventional Cardiology

## 2013-08-20 ENCOUNTER — Encounter (HOSPITAL_COMMUNITY): Payer: Self-pay

## 2013-08-20 ENCOUNTER — Encounter (HOSPITAL_COMMUNITY)
Admission: RE | Admit: 2013-08-20 | Discharge: 2013-08-20 | Disposition: A | Discharge status: Home / Self Care | Payer: Medicare Other | Source: Ambulatory Visit | Attending: Interventional Cardiology | Admitting: Interventional Cardiology

## 2013-08-20 NOTE — Progress Notes (Signed)
Pt graduated from cardiac rehab program today.  Medication list reconciled.  PHQ9 score- 0 .  Pt has made significant lifestyle changes and should be commended for her success.  Pt feels she has achieved her rehab goals of feeling stronger and exceeded her weight loss goal.  Pt plans to continue exercise in cardiac maintenance program.

## 2013-08-23 ENCOUNTER — Encounter (HOSPITAL_COMMUNITY)
Admission: RE | Admit: 2013-08-23 | Discharge: 2013-08-23 | Disposition: A | Discharge status: Home / Self Care | Payer: Self-pay | Source: Ambulatory Visit | Attending: Interventional Cardiology | Admitting: Interventional Cardiology

## 2013-08-23 DIAGNOSIS — I472 Ventricular tachycardia, unspecified: Secondary | ICD-10-CM | POA: Insufficient documentation

## 2013-08-23 DIAGNOSIS — I2119 ST elevation (STEMI) myocardial infarction involving other coronary artery of inferior wall: Secondary | ICD-10-CM | POA: Insufficient documentation

## 2013-08-23 DIAGNOSIS — Z5189 Encounter for other specified aftercare: Secondary | ICD-10-CM | POA: Insufficient documentation

## 2013-08-23 DIAGNOSIS — E785 Hyperlipidemia, unspecified: Secondary | ICD-10-CM | POA: Insufficient documentation

## 2013-08-23 DIAGNOSIS — I10 Essential (primary) hypertension: Secondary | ICD-10-CM | POA: Insufficient documentation

## 2013-08-23 DIAGNOSIS — I2510 Atherosclerotic heart disease of native coronary artery without angina pectoris: Secondary | ICD-10-CM | POA: Insufficient documentation

## 2013-08-23 DIAGNOSIS — I4729 Other ventricular tachycardia: Secondary | ICD-10-CM | POA: Insufficient documentation

## 2013-08-24 DIAGNOSIS — H1030 Unspecified acute conjunctivitis, unspecified eye: Secondary | ICD-10-CM | POA: Diagnosis not present

## 2013-08-25 ENCOUNTER — Encounter (HOSPITAL_COMMUNITY)
Admission: RE | Admit: 2013-08-25 | Discharge: 2013-08-25 | Disposition: A | Discharge status: Home / Self Care | Payer: Self-pay | Source: Ambulatory Visit | Attending: Interventional Cardiology | Admitting: Interventional Cardiology

## 2013-08-27 ENCOUNTER — Encounter (HOSPITAL_COMMUNITY)
Admission: RE | Admit: 2013-08-27 | Discharge: 2013-08-27 | Disposition: A | Discharge status: Home / Self Care | Payer: Self-pay | Source: Ambulatory Visit | Attending: Interventional Cardiology | Admitting: Interventional Cardiology

## 2013-08-30 ENCOUNTER — Other Ambulatory Visit: Payer: Medicare Other

## 2013-08-30 ENCOUNTER — Encounter (HOSPITAL_COMMUNITY)
Admission: RE | Admit: 2013-08-30 | Discharge: 2013-08-30 | Disposition: A | Discharge status: Home / Self Care | Payer: Self-pay | Source: Ambulatory Visit | Attending: Interventional Cardiology | Admitting: Interventional Cardiology

## 2013-09-01 ENCOUNTER — Encounter (HOSPITAL_COMMUNITY)
Admission: RE | Admit: 2013-09-01 | Discharge: 2013-09-01 | Disposition: A | Discharge status: Home / Self Care | Payer: Self-pay | Source: Ambulatory Visit | Attending: Interventional Cardiology | Admitting: Interventional Cardiology

## 2013-09-01 ENCOUNTER — Other Ambulatory Visit (INDEPENDENT_AMBULATORY_CARE_PROVIDER_SITE_OTHER): Payer: Medicare Other

## 2013-09-01 DIAGNOSIS — E785 Hyperlipidemia, unspecified: Secondary | ICD-10-CM | POA: Diagnosis not present

## 2013-09-01 LAB — LIPID PANEL
CHOLESTEROL: 114 mg/dL (ref 0–200)
HDL: 41.8 mg/dL (ref >39.00)
LDL Cholesterol: 57 mg/dL (ref 0–99)
NonHDL: 72.2
TRIGLYCERIDES: 78 mg/dL (ref 0.0–149.0)
Total CHOL/HDL Ratio: 3
VLDL: 15.6 mg/dL (ref 0.0–40.0)

## 2013-09-01 LAB — HEPATIC FUNCTION PANEL
ALBUMIN: 4.1 g/dL (ref 3.5–5.2)
ALT: 22 U/L (ref 0–35)
AST: 24 U/L (ref 0–37)
Alkaline Phosphatase: 77 U/L (ref 39–117)
Bilirubin, Direct: 0.1 mg/dL (ref 0.0–0.3)
TOTAL PROTEIN: 6.8 g/dL (ref 6.0–8.3)
Total Bilirubin: 0.7 mg/dL (ref 0.2–1.2)

## 2013-09-03 ENCOUNTER — Encounter (HOSPITAL_COMMUNITY)
Admission: RE | Admit: 2013-09-03 | Discharge: 2013-09-03 | Disposition: A | Discharge status: Home / Self Care | Payer: Self-pay | Source: Ambulatory Visit | Attending: Interventional Cardiology | Admitting: Interventional Cardiology

## 2013-09-06 ENCOUNTER — Encounter (HOSPITAL_COMMUNITY)
Admission: RE | Admit: 2013-09-06 | Discharge: 2013-09-06 | Disposition: A | Discharge status: Home / Self Care | Payer: Self-pay | Source: Ambulatory Visit | Attending: Interventional Cardiology | Admitting: Interventional Cardiology

## 2013-09-08 ENCOUNTER — Encounter (HOSPITAL_COMMUNITY): Payer: Self-pay

## 2013-09-09 ENCOUNTER — Encounter: Payer: Self-pay | Admitting: Interventional Cardiology

## 2013-09-09 ENCOUNTER — Other Ambulatory Visit: Payer: Self-pay | Admitting: Cardiology

## 2013-09-09 ENCOUNTER — Ambulatory Visit (INDEPENDENT_AMBULATORY_CARE_PROVIDER_SITE_OTHER): Payer: Medicare Other | Admitting: Interventional Cardiology

## 2013-09-09 VITALS — BP 138/72 | HR 60 | Ht 66.0 in | Wt 207.0 lb

## 2013-09-09 DIAGNOSIS — R0609 Other forms of dyspnea: Secondary | ICD-10-CM | POA: Diagnosis not present

## 2013-09-09 DIAGNOSIS — I209 Angina pectoris, unspecified: Secondary | ICD-10-CM

## 2013-09-09 DIAGNOSIS — I2510 Atherosclerotic heart disease of native coronary artery without angina pectoris: Secondary | ICD-10-CM | POA: Diagnosis not present

## 2013-09-09 DIAGNOSIS — E785 Hyperlipidemia, unspecified: Secondary | ICD-10-CM | POA: Diagnosis not present

## 2013-09-09 DIAGNOSIS — R0989 Other specified symptoms and signs involving the circulatory and respiratory systems: Secondary | ICD-10-CM

## 2013-09-09 DIAGNOSIS — I10 Essential (primary) hypertension: Secondary | ICD-10-CM

## 2013-09-09 DIAGNOSIS — I2119 ST elevation (STEMI) myocardial infarction involving other coronary artery of inferior wall: Secondary | ICD-10-CM

## 2013-09-09 DIAGNOSIS — I25119 Atherosclerotic heart disease of native coronary artery with unspecified angina pectoris: Secondary | ICD-10-CM

## 2013-09-09 MED ORDER — PANTOPRAZOLE SODIUM 40 MG PO TBEC
40.0000 mg | DELAYED_RELEASE_TABLET | Freq: Every day | ORAL | Status: DC
Start: 2013-09-09 — End: 2014-09-26

## 2013-09-09 MED ORDER — CLOPIDOGREL BISULFATE 75 MG PO TABS
75.0000 mg | ORAL_TABLET | Freq: Every day | ORAL | Status: DC
Start: 2013-09-09 — End: 2014-06-30

## 2013-09-09 NOTE — Patient Instructions (Addendum)
Your physician has recommended you make the following change in your medication:   1. Stop Effient.  2. Stop Nexium.  3. Start Plavix 75 mg 1 tablet daily.   4. Start Protonix 40 mg daily   Your physician wants you to follow-up in: 6 months with Dr. Irish Lack. You will receive a reminder letter in the mail two months in advance. If you don't receive a letter, please call our office to schedule the follow-up appointment.

## 2013-09-09 NOTE — Progress Notes (Signed)
Patient ID: [name], female   DOB: February 20, 1941, 73 y.o.   MRN: [id number] Patient ID: [name], female   DOB: Apr 21, 1940, 73 y.o.   MRN: [id number] Patient ID: [name], female   DOB: 1940-06-02, 73 y.o.   MRN: [id number]    [place], [place], [place]  [address] Phone: [phone] Fax:  [phone]  Date:  09/09/2013   ID:  [name], [name] [birth year], MRN [id number]  PCP:  Kimberly Lyons, MD      History of Present Illness: [name] is a 73 y.o. female who had an inferoposterior MI in January 2014. She underwent bare-metal stent placement to her circumflex. She tolerated the procedure well but then did develop chest discomfort and ventricular tachycardia later that night. She had a repeat cardiac catheterization the next day which revealed a patent circumflex stent. She stayed in the hospital for a few more days and went home. Since coming home, she reported some dyspnea on exertion which is more than usual. This improved after switching Brilinta to Effient.  She developed a rash, particularly on her back that itched.  She thinks it was related to the contrast and it resolved. No further chest discomfort. She has not used any nitroglycerin. She is participating in cardiac rehabilitation.  No sx lke her MI.  She had some weight gain which resolved with Lasix.     Wt Readings from Last 3 Encounters:  09/09/13 207 lb (93.895 kg)  07/30/13 214 lb 12.8 oz (97.433 kg)  05/20/13 226 lb 6.6 oz (102.7 kg)     Past Medical History  Diagnosis Date  . IV Contrast Allergy   . GERD (gastroesophageal reflux disease)   . Hypothyroidism   . Rosacea   . Hearing loss     a. s/p cochlear implant on right, hearing aid on left.  . Obesity   . Fibroids     a. uterine - spotting noted since 03/11/2013.  Marland Kitchen CAD (coronary artery disease)     a. 03/2013 Infpost STEMI/PCI: LM nl, LAD min irregs, LCX 100 (3.0x12 Vision BMS), RCA  mild ostial dzs, EF  55%.  . Essential hypertension, benign   . Ventricular tachycardia     a. Immediate post pci/MI - no complications, on BB.  Marland Kitchen Hyperlipidemia   . Myocardial infarction     Current Outpatient Prescriptions  Medication Sig Dispense Refill  . aspirin 81 MG tablet Take 81 mg by mouth daily.        Marland Kitchen atorvastatin (LIPITOR) 80 MG tablet Take 0.5 tablets (40 mg total) by mouth daily at 6 PM.  30 tablet  6  . Calcium Carb-Cholecalciferol (CALCIUM + D3) 600-200 MG-UNIT TABS Take 1 tablet by mouth daily.      Marland Kitchen esomeprazole (NEXIUM) 40 MG capsule Take 40 mg by mouth daily before breakfast.       . FUROSEMIDE PO Take by mouth as needed (Directed to take with KDur for weight gain.). unknown      . levothyroxine (SYNTHROID, LEVOTHROID) 100 MCG tablet Take 100 mcg by mouth daily before breakfast.      . metoprolol succinate (TOPROL XL) 25 MG 24 hr tablet Take 1 tablet (25 mg total) by mouth daily.  30 tablet  6  . metroNIDAZOLE (METROGEL) 0.75 % gel Apply 1 application topically daily. Patient places on her cheeks and nose, only about once or twice a week depending on the climate.      Marland Kitchen  nitroGLYCERIN (NITROSTAT) 0.4 MG SL tablet Place 1 tablet (0.4 mg total) under the tongue every 5 (five) minutes x 3 doses as needed for chest pain.  25 tablet  3  . omega-3 acid ethyl esters (LOVAZA) 1 G capsule Take 1 g by mouth daily.      Marland Kitchen POTASSIUM PO Take by mouth as needed (Directed to take with lasix for weight gain). Unknown dose      . prasugrel (EFFIENT) 10 MG TABS tablet Take 1 tablet (10 mg total) by mouth daily.  30 tablet  6   No current facility-administered medications for this visit.    Allergies:    Allergies  Allergen Reactions  . Bee Venom Itching and Swelling  . Iohexol Hives    Social History:  The patient  reports that she quit smoking about 45 years ago. Her smoking use included Cigarettes. She has a .4 pack-year smoking history. She does not have any smokeless tobacco history on file. She  reports that she does not drink alcohol or use illicit drugs.   Family History:  The patient's family history includes Cancer in her mother; Colon cancer in her cousin; Diabetes in her mother and sister; Heart disease in her mother; Heart failure in her mother; Hyperlipidemia in her sister; Hypertension in her mother; Other in her father. There is no history of Esophageal cancer or Stomach cancer.   ROS:  Please see the history of present illness.  No nausea, vomiting.  No fevers, chills.  No focal weakness.  No dysuria. Rash.   All other systems reviewed and negative.   PHYSICAL EXAM: VS:  BP 138/72  Pulse 60  Ht 5\' 6"  (1.676 m)  Wt 207 lb (93.895 kg)  BMI 33.43 kg/m2 Well nourished, well developed, in no acute distress HEENT: normal Neck: no JVD, no carotid bruits Cardiac:  normal S1, S2; RRR;  Lungs:  clear to auscultation bilaterally, no wheezing, rhonchi or rales Abd: soft, nontender, no hepatomegaly Ext: no edema, 2+ radial pulses bilaterally.  Skin: warm and dry; macular, erythematous rash all over her back and sides of her legs Neuro:   no focal abnormalities noted      ASSESSMENT AND PLAN:  1. Rash: Resolved.  Shortness of breath: Better with Brilinta to Effient change but not gone.  Effient now very expensive, will change to clopidogrel. 2. Recent inferior MI: Bare-metal stent used due to some uterine bleeding that she's had.  Post procedure, she had ventricular tachycardia and underwent repeat cardiac catheterization which showed a widely patent stent.   No lightheadedness or syncope. The arrhythmia was likely related to her MI. Echocardiogram from the hospital was reviewed. Unfortunately, they're unable to estimate ejection fraction. From her ventriculogram, her ejection fraction appeared well preserved.     3. She asked about CRP.  SHe is already on DAPT and statin.  4. Hyperlipidemia: LDL 117 at the time of the MI.  Should improve with atorvastatin 80 mg daily. Will check  lipids in the month. If LDL is much below 70, could reduce dose of atorvastatin. 5. Fluid overload/edema: This is improved. She will now take the furosemide as needed based on any weight gain or symptoms of fluid overload that she may have. She had one episode where is difficult to get her shoes on. This improved after taking a dose of Lasix.   6. Hypertension: Reduced dose of ARB given some low blood pressure readings in the past.  BP now controlled. 7. Obesity: She has  lost some weight recently.    Preoperative eval:  If colonoscopy necessary, could hold plavix since she received a bare metal stent.  GI: Dr. Henrene Pastor.  Signed, Mina Marble, MD, [facility] 09/09/2013 2:38 PM

## 2013-09-10 ENCOUNTER — Encounter (HOSPITAL_COMMUNITY)
Admission: RE | Admit: 2013-09-10 | Discharge: 2013-09-10 | Disposition: A | Discharge status: Home / Self Care | Payer: Self-pay | Source: Ambulatory Visit | Attending: Interventional Cardiology | Admitting: Interventional Cardiology

## 2013-09-13 ENCOUNTER — Encounter (HOSPITAL_COMMUNITY)
Admission: RE | Admit: 2013-09-13 | Discharge: 2013-09-13 | Disposition: A | Discharge status: Home / Self Care | Payer: Self-pay | Source: Ambulatory Visit | Attending: Interventional Cardiology | Admitting: Interventional Cardiology

## 2013-09-15 ENCOUNTER — Encounter (HOSPITAL_COMMUNITY)
Admission: RE | Admit: 2013-09-15 | Discharge: 2013-09-15 | Disposition: A | Discharge status: Home / Self Care | Payer: Self-pay | Source: Ambulatory Visit | Attending: Interventional Cardiology | Admitting: Interventional Cardiology

## 2013-09-15 DIAGNOSIS — I2510 Atherosclerotic heart disease of native coronary artery without angina pectoris: Secondary | ICD-10-CM | POA: Insufficient documentation

## 2013-09-15 DIAGNOSIS — I4729 Other ventricular tachycardia: Secondary | ICD-10-CM | POA: Insufficient documentation

## 2013-09-15 DIAGNOSIS — I10 Essential (primary) hypertension: Secondary | ICD-10-CM | POA: Insufficient documentation

## 2013-09-15 DIAGNOSIS — I472 Ventricular tachycardia, unspecified: Secondary | ICD-10-CM | POA: Insufficient documentation

## 2013-09-15 DIAGNOSIS — Z5189 Encounter for other specified aftercare: Secondary | ICD-10-CM | POA: Insufficient documentation

## 2013-09-15 DIAGNOSIS — E785 Hyperlipidemia, unspecified: Secondary | ICD-10-CM | POA: Insufficient documentation

## 2013-09-15 DIAGNOSIS — I2119 ST elevation (STEMI) myocardial infarction involving other coronary artery of inferior wall: Secondary | ICD-10-CM | POA: Insufficient documentation

## 2013-09-17 ENCOUNTER — Encounter (HOSPITAL_COMMUNITY): Payer: Self-pay

## 2013-09-20 ENCOUNTER — Encounter (HOSPITAL_COMMUNITY): Payer: Self-pay

## 2013-09-22 ENCOUNTER — Encounter (HOSPITAL_COMMUNITY)
Admission: RE | Admit: 2013-09-22 | Discharge: 2013-09-22 | Disposition: A | Discharge status: Home / Self Care | Payer: Self-pay | Source: Ambulatory Visit | Attending: Interventional Cardiology | Admitting: Interventional Cardiology

## 2013-09-23 ENCOUNTER — Other Ambulatory Visit: Payer: Self-pay | Admitting: Dermatology

## 2013-09-23 DIAGNOSIS — D485 Neoplasm of uncertain behavior of skin: Secondary | ICD-10-CM | POA: Diagnosis not present

## 2013-09-23 DIAGNOSIS — L821 Other seborrheic keratosis: Secondary | ICD-10-CM | POA: Diagnosis not present

## 2013-09-23 DIAGNOSIS — L909 Atrophic disorder of skin, unspecified: Secondary | ICD-10-CM | POA: Diagnosis not present

## 2013-09-23 DIAGNOSIS — L919 Hypertrophic disorder of the skin, unspecified: Secondary | ICD-10-CM | POA: Diagnosis not present

## 2013-09-23 DIAGNOSIS — B079 Viral wart, unspecified: Secondary | ICD-10-CM | POA: Diagnosis not present

## 2013-09-24 ENCOUNTER — Encounter (HOSPITAL_COMMUNITY)
Admission: RE | Admit: 2013-09-24 | Discharge: 2013-09-24 | Disposition: A | Discharge status: Home / Self Care | Payer: Self-pay | Source: Ambulatory Visit | Attending: Interventional Cardiology | Admitting: Interventional Cardiology

## 2013-09-26 ENCOUNTER — Other Ambulatory Visit: Payer: Self-pay | Admitting: Family Medicine

## 2013-09-27 ENCOUNTER — Encounter (HOSPITAL_COMMUNITY)
Admission: RE | Admit: 2013-09-27 | Discharge: 2013-09-27 | Disposition: A | Discharge status: Home / Self Care | Payer: Self-pay | Source: Ambulatory Visit | Attending: Interventional Cardiology | Admitting: Interventional Cardiology

## 2013-09-29 ENCOUNTER — Encounter (HOSPITAL_COMMUNITY)
Admission: RE | Admit: 2013-09-29 | Discharge: 2013-09-29 | Disposition: A | Discharge status: Home / Self Care | Payer: Self-pay | Source: Ambulatory Visit | Attending: Interventional Cardiology | Admitting: Interventional Cardiology

## 2013-10-01 ENCOUNTER — Encounter (HOSPITAL_COMMUNITY)
Admission: RE | Admit: 2013-10-01 | Discharge: 2013-10-01 | Disposition: A | Discharge status: Home / Self Care | Payer: Self-pay | Source: Ambulatory Visit | Attending: Interventional Cardiology | Admitting: Interventional Cardiology

## 2013-10-04 ENCOUNTER — Encounter (HOSPITAL_COMMUNITY)
Admission: RE | Admit: 2013-10-04 | Discharge: 2013-10-04 | Disposition: A | Discharge status: Home / Self Care | Payer: Self-pay | Source: Ambulatory Visit | Attending: Interventional Cardiology | Admitting: Interventional Cardiology

## 2013-10-06 ENCOUNTER — Encounter (HOSPITAL_COMMUNITY)
Admission: RE | Admit: 2013-10-06 | Discharge: 2013-10-06 | Disposition: A | Discharge status: Home / Self Care | Payer: Self-pay | Source: Ambulatory Visit | Attending: Interventional Cardiology | Admitting: Interventional Cardiology

## 2013-10-08 ENCOUNTER — Encounter (HOSPITAL_COMMUNITY)
Admission: RE | Admit: 2013-10-08 | Discharge: 2013-10-08 | Disposition: A | Discharge status: Home / Self Care | Payer: Self-pay | Source: Ambulatory Visit | Attending: Interventional Cardiology | Admitting: Interventional Cardiology

## 2013-10-11 ENCOUNTER — Encounter (HOSPITAL_COMMUNITY)
Admission: RE | Admit: 2013-10-11 | Discharge: 2013-10-11 | Disposition: A | Discharge status: Home / Self Care | Payer: Self-pay | Source: Ambulatory Visit | Attending: Interventional Cardiology | Admitting: Interventional Cardiology

## 2013-10-13 ENCOUNTER — Encounter (HOSPITAL_COMMUNITY)
Admission: RE | Admit: 2013-10-13 | Discharge: 2013-10-13 | Disposition: A | Discharge status: Home / Self Care | Payer: Self-pay | Source: Ambulatory Visit | Attending: Interventional Cardiology | Admitting: Interventional Cardiology

## 2013-10-15 ENCOUNTER — Encounter (HOSPITAL_COMMUNITY)
Admission: RE | Admit: 2013-10-15 | Discharge: 2013-10-15 | Disposition: A | Discharge status: Home / Self Care | Payer: Self-pay | Source: Ambulatory Visit | Attending: Interventional Cardiology | Admitting: Interventional Cardiology

## 2013-10-18 ENCOUNTER — Encounter (HOSPITAL_COMMUNITY)
Admission: RE | Admit: 2013-10-18 | Discharge: 2013-10-18 | Disposition: A | Discharge status: Home / Self Care | Payer: Self-pay | Source: Ambulatory Visit | Attending: Interventional Cardiology | Admitting: Interventional Cardiology

## 2013-10-18 DIAGNOSIS — I10 Essential (primary) hypertension: Secondary | ICD-10-CM | POA: Insufficient documentation

## 2013-10-18 DIAGNOSIS — I4729 Other ventricular tachycardia: Secondary | ICD-10-CM | POA: Insufficient documentation

## 2013-10-18 DIAGNOSIS — I472 Ventricular tachycardia, unspecified: Secondary | ICD-10-CM | POA: Insufficient documentation

## 2013-10-18 DIAGNOSIS — I2510 Atherosclerotic heart disease of native coronary artery without angina pectoris: Secondary | ICD-10-CM | POA: Insufficient documentation

## 2013-10-18 DIAGNOSIS — Z5189 Encounter for other specified aftercare: Secondary | ICD-10-CM | POA: Insufficient documentation

## 2013-10-18 DIAGNOSIS — I2119 ST elevation (STEMI) myocardial infarction involving other coronary artery of inferior wall: Secondary | ICD-10-CM | POA: Insufficient documentation

## 2013-10-18 DIAGNOSIS — E785 Hyperlipidemia, unspecified: Secondary | ICD-10-CM | POA: Insufficient documentation

## 2013-10-20 ENCOUNTER — Encounter (HOSPITAL_COMMUNITY)
Admission: RE | Admit: 2013-10-20 | Discharge: 2013-10-20 | Disposition: A | Discharge status: Home / Self Care | Payer: Self-pay | Source: Ambulatory Visit | Attending: Interventional Cardiology | Admitting: Interventional Cardiology

## 2013-10-22 ENCOUNTER — Encounter (HOSPITAL_COMMUNITY)
Admission: RE | Admit: 2013-10-22 | Discharge: 2013-10-22 | Disposition: A | Discharge status: Home / Self Care | Payer: Self-pay | Source: Ambulatory Visit | Attending: Interventional Cardiology | Admitting: Interventional Cardiology

## 2013-10-25 ENCOUNTER — Encounter (HOSPITAL_COMMUNITY)
Admission: RE | Admit: 2013-10-25 | Discharge: 2013-10-25 | Disposition: A | Discharge status: Home / Self Care | Payer: Self-pay | Source: Ambulatory Visit | Attending: Interventional Cardiology | Admitting: Interventional Cardiology

## 2013-10-27 ENCOUNTER — Encounter (HOSPITAL_COMMUNITY)
Admission: RE | Admit: 2013-10-27 | Discharge: 2013-10-27 | Disposition: A | Discharge status: Home / Self Care | Payer: Self-pay | Source: Ambulatory Visit | Attending: Interventional Cardiology | Admitting: Interventional Cardiology

## 2013-10-28 ENCOUNTER — Other Ambulatory Visit: Payer: Self-pay | Admitting: Nurse Practitioner

## 2013-10-29 ENCOUNTER — Encounter (HOSPITAL_COMMUNITY)
Admission: RE | Admit: 2013-10-29 | Discharge: 2013-10-29 | Disposition: A | Discharge status: Home / Self Care | Payer: Self-pay | Source: Ambulatory Visit | Attending: Interventional Cardiology | Admitting: Interventional Cardiology

## 2013-11-01 ENCOUNTER — Encounter (HOSPITAL_COMMUNITY)
Admission: RE | Admit: 2013-11-01 | Discharge: 2013-11-01 | Disposition: A | Discharge status: Home / Self Care | Payer: Self-pay | Source: Ambulatory Visit | Attending: Interventional Cardiology | Admitting: Interventional Cardiology

## 2013-11-03 ENCOUNTER — Encounter (HOSPITAL_COMMUNITY)
Admission: RE | Admit: 2013-11-03 | Discharge: 2013-11-03 | Disposition: A | Discharge status: Home / Self Care | Payer: Self-pay | Source: Ambulatory Visit | Attending: Interventional Cardiology | Admitting: Interventional Cardiology

## 2013-11-05 ENCOUNTER — Encounter (HOSPITAL_COMMUNITY)
Admission: RE | Admit: 2013-11-05 | Discharge: 2013-11-05 | Disposition: A | Discharge status: Home / Self Care | Payer: Self-pay | Source: Ambulatory Visit | Attending: Interventional Cardiology | Admitting: Interventional Cardiology

## 2013-11-08 ENCOUNTER — Encounter (HOSPITAL_COMMUNITY)
Admission: RE | Admit: 2013-11-08 | Discharge: 2013-11-08 | Disposition: A | Discharge status: Home / Self Care | Payer: Self-pay | Source: Ambulatory Visit | Attending: Interventional Cardiology | Admitting: Interventional Cardiology

## 2013-11-10 ENCOUNTER — Encounter (HOSPITAL_COMMUNITY)
Admission: RE | Admit: 2013-11-10 | Discharge: 2013-11-10 | Disposition: A | Discharge status: Home / Self Care | Payer: Self-pay | Source: Ambulatory Visit | Attending: Interventional Cardiology | Admitting: Interventional Cardiology

## 2013-11-12 ENCOUNTER — Encounter (HOSPITAL_COMMUNITY)
Admission: RE | Admit: 2013-11-12 | Discharge: 2013-11-12 | Disposition: A | Discharge status: Home / Self Care | Payer: Self-pay | Source: Ambulatory Visit | Attending: Interventional Cardiology | Admitting: Interventional Cardiology

## 2013-11-15 ENCOUNTER — Encounter (HOSPITAL_COMMUNITY)
Admission: RE | Admit: 2013-11-15 | Discharge: 2013-11-15 | Disposition: A | Discharge status: Home / Self Care | Payer: Self-pay | Source: Ambulatory Visit | Attending: Interventional Cardiology | Admitting: Interventional Cardiology

## 2013-11-17 ENCOUNTER — Encounter (HOSPITAL_COMMUNITY)
Admission: RE | Admit: 2013-11-17 | Discharge: 2013-11-17 | Disposition: A | Discharge status: Home / Self Care | Payer: Self-pay | Source: Ambulatory Visit | Attending: Interventional Cardiology | Admitting: Interventional Cardiology

## 2013-11-17 DIAGNOSIS — E785 Hyperlipidemia, unspecified: Secondary | ICD-10-CM | POA: Insufficient documentation

## 2013-11-17 DIAGNOSIS — I4729 Other ventricular tachycardia: Secondary | ICD-10-CM | POA: Insufficient documentation

## 2013-11-17 DIAGNOSIS — Z5189 Encounter for other specified aftercare: Secondary | ICD-10-CM | POA: Insufficient documentation

## 2013-11-17 DIAGNOSIS — I472 Ventricular tachycardia, unspecified: Secondary | ICD-10-CM | POA: Insufficient documentation

## 2013-11-17 DIAGNOSIS — I2510 Atherosclerotic heart disease of native coronary artery without angina pectoris: Secondary | ICD-10-CM | POA: Insufficient documentation

## 2013-11-17 DIAGNOSIS — I10 Essential (primary) hypertension: Secondary | ICD-10-CM | POA: Insufficient documentation

## 2013-11-17 DIAGNOSIS — I2119 ST elevation (STEMI) myocardial infarction involving other coronary artery of inferior wall: Secondary | ICD-10-CM | POA: Insufficient documentation

## 2013-11-19 ENCOUNTER — Encounter (HOSPITAL_COMMUNITY)
Admission: RE | Admit: 2013-11-19 | Discharge: 2013-11-19 | Disposition: A | Discharge status: Home / Self Care | Payer: Self-pay | Source: Ambulatory Visit | Attending: Interventional Cardiology | Admitting: Interventional Cardiology

## 2013-11-22 ENCOUNTER — Encounter (HOSPITAL_COMMUNITY): Payer: Self-pay

## 2013-11-23 DIAGNOSIS — H35039 Hypertensive retinopathy, unspecified eye: Secondary | ICD-10-CM | POA: Diagnosis not present

## 2013-11-23 DIAGNOSIS — H5210 Myopia, unspecified eye: Secondary | ICD-10-CM | POA: Diagnosis not present

## 2013-11-23 DIAGNOSIS — H2510 Age-related nuclear cataract, unspecified eye: Secondary | ICD-10-CM | POA: Diagnosis not present

## 2013-11-24 ENCOUNTER — Encounter (HOSPITAL_COMMUNITY)
Admission: RE | Admit: 2013-11-24 | Discharge: 2013-11-24 | Disposition: A | Discharge status: Home / Self Care | Payer: Self-pay | Source: Ambulatory Visit | Attending: Interventional Cardiology | Admitting: Interventional Cardiology

## 2013-11-26 ENCOUNTER — Encounter (HOSPITAL_COMMUNITY)
Admission: RE | Admit: 2013-11-26 | Discharge: 2013-11-26 | Disposition: A | Discharge status: Home / Self Care | Payer: Self-pay | Source: Ambulatory Visit | Attending: Interventional Cardiology | Admitting: Interventional Cardiology

## 2013-11-29 ENCOUNTER — Encounter (HOSPITAL_COMMUNITY)
Admission: RE | Admit: 2013-11-29 | Discharge: 2013-11-29 | Disposition: A | Discharge status: Home / Self Care | Payer: Self-pay | Source: Ambulatory Visit | Attending: Interventional Cardiology | Admitting: Interventional Cardiology

## 2013-12-01 ENCOUNTER — Encounter (HOSPITAL_COMMUNITY)
Admission: RE | Admit: 2013-12-01 | Discharge: 2013-12-01 | Disposition: A | Discharge status: Home / Self Care | Payer: Self-pay | Source: Ambulatory Visit | Attending: Interventional Cardiology | Admitting: Interventional Cardiology

## 2013-12-06 ENCOUNTER — Encounter (HOSPITAL_COMMUNITY)
Admission: RE | Admit: 2013-12-06 | Discharge: 2013-12-06 | Disposition: A | Discharge status: Home / Self Care | Payer: Self-pay | Source: Ambulatory Visit | Attending: Interventional Cardiology | Admitting: Interventional Cardiology

## 2013-12-08 ENCOUNTER — Encounter (HOSPITAL_COMMUNITY)
Admission: RE | Admit: 2013-12-08 | Discharge: 2013-12-08 | Disposition: A | Discharge status: Home / Self Care | Payer: Self-pay | Source: Ambulatory Visit | Attending: Interventional Cardiology | Admitting: Interventional Cardiology

## 2013-12-10 ENCOUNTER — Encounter (HOSPITAL_COMMUNITY)
Admission: RE | Admit: 2013-12-10 | Discharge: 2013-12-10 | Disposition: A | Discharge status: Home / Self Care | Payer: Self-pay | Source: Ambulatory Visit | Attending: Interventional Cardiology | Admitting: Interventional Cardiology

## 2013-12-13 ENCOUNTER — Encounter (HOSPITAL_COMMUNITY)
Admission: RE | Admit: 2013-12-13 | Discharge: 2013-12-13 | Disposition: A | Discharge status: Home / Self Care | Payer: Self-pay | Source: Ambulatory Visit | Attending: Interventional Cardiology | Admitting: Interventional Cardiology

## 2013-12-15 ENCOUNTER — Encounter (HOSPITAL_COMMUNITY): Payer: Self-pay

## 2013-12-17 ENCOUNTER — Encounter (HOSPITAL_COMMUNITY)
Admission: RE | Admit: 2013-12-17 | Discharge: 2013-12-17 | Disposition: A | Discharge status: Home / Self Care | Payer: Self-pay | Source: Ambulatory Visit | Attending: Interventional Cardiology | Admitting: Interventional Cardiology

## 2013-12-17 ENCOUNTER — Ambulatory Visit (INDEPENDENT_AMBULATORY_CARE_PROVIDER_SITE_OTHER): Payer: Medicare Other | Admitting: Interventional Cardiology

## 2013-12-17 ENCOUNTER — Encounter: Payer: Self-pay | Admitting: Interventional Cardiology

## 2013-12-17 VITALS — BP 150/80 | HR 59 | Ht 66.0 in | Wt 205.0 lb

## 2013-12-17 DIAGNOSIS — R002 Palpitations: Secondary | ICD-10-CM

## 2013-12-17 DIAGNOSIS — I2119 ST elevation (STEMI) myocardial infarction involving other coronary artery of inferior wall: Secondary | ICD-10-CM | POA: Diagnosis not present

## 2013-12-17 DIAGNOSIS — Z9861 Coronary angioplasty status: Secondary | ICD-10-CM | POA: Insufficient documentation

## 2013-12-17 DIAGNOSIS — I2510 Atherosclerotic heart disease of native coronary artery without angina pectoris: Secondary | ICD-10-CM | POA: Insufficient documentation

## 2013-12-17 DIAGNOSIS — I10 Essential (primary) hypertension: Secondary | ICD-10-CM | POA: Insufficient documentation

## 2013-12-17 DIAGNOSIS — I472 Ventricular tachycardia: Secondary | ICD-10-CM | POA: Insufficient documentation

## 2013-12-17 DIAGNOSIS — Z5189 Encounter for other specified aftercare: Secondary | ICD-10-CM | POA: Insufficient documentation

## 2013-12-17 DIAGNOSIS — I252 Old myocardial infarction: Secondary | ICD-10-CM | POA: Insufficient documentation

## 2013-12-17 DIAGNOSIS — E785 Hyperlipidemia, unspecified: Secondary | ICD-10-CM | POA: Insufficient documentation

## 2013-12-17 NOTE — Patient Instructions (Signed)
Your physician recommends that you continue on your current medications as directed. Please refer to the Current Medication list given to you today.  Your physician recommends that you follow up as scheduled.

## 2013-12-17 NOTE — Progress Notes (Signed)
Patient ID: [name] [name], female   DOB: March 29, 1940, 73 y.o.   MRN: [id number]    [place], [place], [place]  [address] Phone: [phone] Fax:  [phone]  Date:  12/17/2013   ID:  [name], [name] Sep 09, 1940, MRN [id number]  PCP:  [name], MD      History of Present Illness: [name] [name] is a 73 y.o. female who had an inferoposterior MI in January 2014. She underwent bare-metal stent placement to her circumflex. She tolerated the procedure well but then did develop chest discomfort and ventricular tachycardia later that night. She had a repeat cardiac catheterization the next day which revealed a patent circumflex stent. She stayed in the hospital for a few more days and went home. Since coming home, she reported some dyspnea on exertion which is more than usual. This improved after switching Brilinta to Effient. She developed a rash, particularly on her back that itched. She thinks it was related to the contrast and it resolved. No further chest discomfort. She has not used any nitroglycerin. She is participating in cardiac rehabilitation. No sx lke her MI.  Effient switched to Plavix, and then [facility] resolved.  SHe has noticed the irregular heart symbol on her BP machine.  She feels skipped beats.    Wt Readings from Last 3 Encounters:  12/17/13 205 lb (92.987 kg)  09/09/13 207 lb (93.895 kg)  07/30/13 214 lb 12.8 oz (97.433 kg)     Past Medical History  Diagnosis Date  . IV Contrast Allergy   . GERD (gastroesophageal reflux disease)   . Hypothyroidism   . Rosacea   . Hearing loss     a. s/p cochlear implant on right, hearing aid on left.  . Obesity   . Fibroids     a. uterine - spotting noted since 03/11/2013.  Marland Kitchen CAD (coronary artery disease)     a. 03/2013 Infpost STEMI/PCI: LM nl, LAD min irregs, LCX 100 (3.0x12 Vision BMS), RCA  mild ostial dzs, EF 55%.  . Essential hypertension, benign   . Ventricular tachycardia     a. Immediate  post pci/MI - no complications, on BB.  Marland Kitchen Hyperlipidemia   . Myocardial infarction     Current Outpatient Prescriptions  Medication Sig Dispense Refill  . aspirin 81 MG tablet Take 81 mg by mouth daily.        Marland Kitchen atorvastatin (LIPITOR) 80 MG tablet Take 0.5 tablets (40 mg total) by mouth daily at 6 PM.  30 tablet  6  . Calcium Carb-Cholecalciferol (CALCIUM + D3) 600-200 MG-UNIT TABS Take 1 tablet by mouth daily.      . clopidogrel (PLAVIX) 75 MG tablet Take 1 tablet (75 mg total) by mouth daily.  30 tablet  11  . FUROSEMIDE PO Take 20 mg by mouth as needed (Directed to take with KDur for weight gain.). unknown      . levothyroxine (SYNTHROID, LEVOTHROID) 100 MCG tablet Take 100 mcg by mouth daily before breakfast.      . metoprolol succinate (TOPROL-XL) 25 MG 24 hr tablet TAKE 1 TABLET DAILY.  30 tablet  6  . metroNIDAZOLE (METROGEL) 0.75 % gel Apply 1 application topically daily. Patient places on her cheeks and nose, only about once or twice a week depending on the climate.      . nitroGLYCERIN (NITROSTAT) 0.4 MG SL tablet Place 1 tablet (0.4 mg total) under the tongue every 5 (five) minutes x 3 doses as  needed for chest pain.  25 tablet  3  . omega-3 acid ethyl esters (LOVAZA) 1 G capsule Take 1 g by mouth daily.      . pantoprazole (PROTONIX) 40 MG tablet Take 1 tablet (40 mg total) by mouth daily.  30 tablet  11  . POTASSIUM PO Take 20 mg by mouth as needed (Directed to take with lasix for weight gain). Unknown dose       No current facility-administered medications for this visit.    Allergies:    Allergies  Allergen Reactions  . Bee Venom Itching and Swelling  . Iohexol Hives    Social History:  The patient  reports that she quit smoking about 45 years ago. Her smoking use included Cigarettes. She has a .4 pack-year smoking history. She does not have any smokeless tobacco history on file. She reports that she does not drink alcohol or use illicit drugs.   Family History:  The  patient's family history includes Cancer in her mother; Colon cancer in her cousin; Diabetes in her mother and sister; Heart attack in her maternal grandmother and mother; Heart disease in her mother; Heart failure in her mother; Hyperlipidemia in her sister; Hypertension in her mother; Other in her father. There is no history of Esophageal cancer, Stomach cancer, or Stroke.   ROS:  Please see the history of present illness.  No nausea, vomiting.  No fevers, chills.  No focal weakness.  No dysuria.    All other systems reviewed and negative.   PHYSICAL EXAM: VS:  BP 150/80  Pulse 59  Ht 5\' 6"  (1.676 m)  Wt 205 lb (92.987 kg)  BMI 33.10 kg/m2 Well nourished, well developed, in no acute distress HEENT: normal Neck: no JVD, no carotid bruits Cardiac:  normal S1, S2; RRR; premature beats which correlate to her symptoms Lungs:  clear to auscultation bilaterally, no wheezing, rhonchi or rales Abd: soft, nontender, no hepatomegaly Ext: no edema Skin: warm and dry Neuro:   no focal abnormalities noted  EKG:  NSR, Q waves in lead III    ASSESSMENT AND PLAN:  1. Palpitations:  Premature beats noted on physical examination. Likely PACs or PVCs. Nothing sustained. The symptoms she felt while I was listening to her in the office are the same as what she has felt at home. We will not plan for a monitor today. If she has any more sustained symptoms, she will let us know. We'll plan for her usual followup in December.  Signed, Mina Marble, MD, [facility] 12/17/2013 4:09 PM

## 2013-12-20 ENCOUNTER — Encounter (HOSPITAL_COMMUNITY)
Admission: RE | Admit: 2013-12-20 | Discharge: 2013-12-20 | Disposition: A | Discharge status: Home / Self Care | Payer: Self-pay | Source: Ambulatory Visit | Attending: Interventional Cardiology | Admitting: Interventional Cardiology

## 2013-12-22 ENCOUNTER — Encounter (HOSPITAL_COMMUNITY): Payer: Self-pay

## 2013-12-24 ENCOUNTER — Encounter (HOSPITAL_COMMUNITY)
Admission: RE | Admit: 2013-12-24 | Discharge: 2013-12-24 | Disposition: A | Discharge status: Home / Self Care | Payer: Self-pay | Source: Ambulatory Visit | Attending: Interventional Cardiology | Admitting: Interventional Cardiology

## 2013-12-27 ENCOUNTER — Encounter (HOSPITAL_COMMUNITY)
Admission: RE | Admit: 2013-12-27 | Discharge: 2013-12-27 | Disposition: A | Discharge status: Home / Self Care | Payer: Self-pay | Source: Ambulatory Visit | Attending: Interventional Cardiology | Admitting: Interventional Cardiology

## 2013-12-29 ENCOUNTER — Encounter (HOSPITAL_COMMUNITY)
Admission: RE | Admit: 2013-12-29 | Discharge: 2013-12-29 | Disposition: A | Discharge status: Home / Self Care | Payer: Self-pay | Source: Ambulatory Visit | Attending: Interventional Cardiology | Admitting: Interventional Cardiology

## 2013-12-31 ENCOUNTER — Encounter (HOSPITAL_COMMUNITY)
Admission: RE | Admit: 2013-12-31 | Discharge: 2013-12-31 | Disposition: A | Discharge status: Home / Self Care | Payer: Self-pay | Source: Ambulatory Visit | Attending: Interventional Cardiology | Admitting: Interventional Cardiology

## 2013-12-31 DIAGNOSIS — I10 Essential (primary) hypertension: Secondary | ICD-10-CM | POA: Diagnosis not present

## 2013-12-31 DIAGNOSIS — E039 Hypothyroidism, unspecified: Secondary | ICD-10-CM | POA: Diagnosis not present

## 2013-12-31 DIAGNOSIS — E785 Hyperlipidemia, unspecified: Secondary | ICD-10-CM | POA: Diagnosis not present

## 2014-01-03 ENCOUNTER — Encounter (HOSPITAL_COMMUNITY)
Admission: RE | Admit: 2014-01-03 | Discharge: 2014-01-03 | Disposition: A | Discharge status: Home / Self Care | Payer: Self-pay | Source: Ambulatory Visit | Attending: Interventional Cardiology | Admitting: Interventional Cardiology

## 2014-01-05 ENCOUNTER — Encounter (HOSPITAL_COMMUNITY)
Admission: RE | Admit: 2014-01-05 | Discharge: 2014-01-05 | Disposition: A | Discharge status: Home / Self Care | Payer: Self-pay | Source: Ambulatory Visit | Attending: Interventional Cardiology | Admitting: Interventional Cardiology

## 2014-01-07 ENCOUNTER — Encounter (HOSPITAL_COMMUNITY)
Admission: RE | Admit: 2014-01-07 | Discharge: 2014-01-07 | Disposition: A | Discharge status: Home / Self Care | Payer: Self-pay | Source: Ambulatory Visit | Attending: Interventional Cardiology | Admitting: Interventional Cardiology

## 2014-01-10 ENCOUNTER — Encounter (HOSPITAL_COMMUNITY): Payer: Self-pay

## 2014-01-10 DIAGNOSIS — E039 Hypothyroidism, unspecified: Secondary | ICD-10-CM | POA: Diagnosis not present

## 2014-01-10 DIAGNOSIS — I10 Essential (primary) hypertension: Secondary | ICD-10-CM | POA: Diagnosis not present

## 2014-01-10 DIAGNOSIS — I213 ST elevation (STEMI) myocardial infarction of unspecified site: Secondary | ICD-10-CM | POA: Diagnosis not present

## 2014-01-10 DIAGNOSIS — Z0000 Encounter for general adult medical examination without abnormal findings: Secondary | ICD-10-CM | POA: Diagnosis not present

## 2014-01-10 DIAGNOSIS — Z23 Encounter for immunization: Secondary | ICD-10-CM | POA: Diagnosis not present

## 2014-01-10 DIAGNOSIS — K219 Gastro-esophageal reflux disease without esophagitis: Secondary | ICD-10-CM | POA: Diagnosis not present

## 2014-01-10 DIAGNOSIS — E785 Hyperlipidemia, unspecified: Secondary | ICD-10-CM | POA: Diagnosis not present

## 2014-01-11 DIAGNOSIS — Z1212 Encounter for screening for malignant neoplasm of rectum: Secondary | ICD-10-CM | POA: Diagnosis not present

## 2014-01-12 ENCOUNTER — Encounter (HOSPITAL_COMMUNITY): Payer: Self-pay

## 2014-01-14 ENCOUNTER — Encounter (HOSPITAL_COMMUNITY)
Admission: RE | Admit: 2014-01-14 | Discharge: 2014-01-14 | Disposition: A | Discharge status: Home / Self Care | Payer: Self-pay | Source: Ambulatory Visit | Attending: Interventional Cardiology | Admitting: Interventional Cardiology

## 2014-01-17 ENCOUNTER — Encounter (HOSPITAL_COMMUNITY)
Admission: RE | Admit: 2014-01-17 | Discharge: 2014-01-17 | Disposition: A | Discharge status: Home / Self Care | Payer: Self-pay | Source: Ambulatory Visit | Attending: Interventional Cardiology | Admitting: Interventional Cardiology

## 2014-01-17 DIAGNOSIS — E785 Hyperlipidemia, unspecified: Secondary | ICD-10-CM | POA: Insufficient documentation

## 2014-01-17 DIAGNOSIS — Z9862 Peripheral vascular angioplasty status: Secondary | ICD-10-CM | POA: Insufficient documentation

## 2014-01-17 DIAGNOSIS — Z5189 Encounter for other specified aftercare: Secondary | ICD-10-CM | POA: Insufficient documentation

## 2014-01-17 DIAGNOSIS — I2510 Atherosclerotic heart disease of native coronary artery without angina pectoris: Secondary | ICD-10-CM | POA: Insufficient documentation

## 2014-01-17 DIAGNOSIS — I252 Old myocardial infarction: Secondary | ICD-10-CM | POA: Insufficient documentation

## 2014-01-17 DIAGNOSIS — I10 Essential (primary) hypertension: Secondary | ICD-10-CM | POA: Insufficient documentation

## 2014-01-17 DIAGNOSIS — I472 Ventricular tachycardia: Secondary | ICD-10-CM | POA: Insufficient documentation

## 2014-01-19 ENCOUNTER — Encounter (HOSPITAL_COMMUNITY): Payer: Self-pay

## 2014-01-21 ENCOUNTER — Encounter (HOSPITAL_COMMUNITY)
Admission: RE | Admit: 2014-01-21 | Discharge: 2014-01-21 | Disposition: A | Discharge status: Home / Self Care | Payer: Self-pay | Source: Ambulatory Visit | Attending: Interventional Cardiology | Admitting: Interventional Cardiology

## 2014-01-24 ENCOUNTER — Encounter (HOSPITAL_COMMUNITY)
Admission: RE | Admit: 2014-01-24 | Discharge: 2014-01-24 | Disposition: A | Discharge status: Home / Self Care | Payer: Self-pay | Source: Ambulatory Visit | Attending: Interventional Cardiology | Admitting: Interventional Cardiology

## 2014-01-26 ENCOUNTER — Encounter (HOSPITAL_COMMUNITY)
Admission: RE | Admit: 2014-01-26 | Discharge: 2014-01-26 | Disposition: A | Discharge status: Home / Self Care | Payer: Self-pay | Source: Ambulatory Visit | Attending: Interventional Cardiology | Admitting: Interventional Cardiology

## 2014-01-28 ENCOUNTER — Encounter (HOSPITAL_COMMUNITY)
Admission: RE | Admit: 2014-01-28 | Discharge: 2014-01-28 | Disposition: A | Discharge status: Home / Self Care | Payer: Self-pay | Source: Ambulatory Visit | Attending: Interventional Cardiology | Admitting: Interventional Cardiology

## 2014-01-31 ENCOUNTER — Encounter (HOSPITAL_COMMUNITY)
Admission: RE | Admit: 2014-01-31 | Discharge: 2014-01-31 | Disposition: A | Discharge status: Home / Self Care | Payer: Self-pay | Source: Ambulatory Visit | Attending: Interventional Cardiology | Admitting: Interventional Cardiology

## 2014-02-02 ENCOUNTER — Encounter (HOSPITAL_COMMUNITY)
Admission: RE | Admit: 2014-02-02 | Discharge: 2014-02-02 | Disposition: A | Discharge status: Home / Self Care | Payer: Self-pay | Source: Ambulatory Visit | Attending: Interventional Cardiology | Admitting: Interventional Cardiology

## 2014-02-04 ENCOUNTER — Encounter (HOSPITAL_COMMUNITY)
Admission: RE | Admit: 2014-02-04 | Discharge: 2014-02-04 | Disposition: A | Discharge status: Home / Self Care | Payer: Self-pay | Source: Ambulatory Visit | Attending: Interventional Cardiology | Admitting: Interventional Cardiology

## 2014-02-07 ENCOUNTER — Encounter (HOSPITAL_COMMUNITY)
Admission: RE | Admit: 2014-02-07 | Discharge: 2014-02-07 | Disposition: A | Discharge status: Home / Self Care | Payer: Self-pay | Source: Ambulatory Visit | Attending: Interventional Cardiology | Admitting: Interventional Cardiology

## 2014-02-09 ENCOUNTER — Encounter (HOSPITAL_COMMUNITY): Payer: Self-pay

## 2014-02-14 ENCOUNTER — Encounter (HOSPITAL_COMMUNITY)
Admission: RE | Admit: 2014-02-14 | Discharge: 2014-02-14 | Disposition: A | Discharge status: Home / Self Care | Payer: Self-pay | Source: Ambulatory Visit | Attending: Interventional Cardiology | Admitting: Interventional Cardiology

## 2014-02-16 ENCOUNTER — Encounter (HOSPITAL_COMMUNITY)
Admission: RE | Admit: 2014-02-16 | Discharge: 2014-02-16 | Disposition: A | Discharge status: Home / Self Care | Payer: Self-pay | Source: Ambulatory Visit | Attending: Interventional Cardiology | Admitting: Interventional Cardiology

## 2014-02-16 DIAGNOSIS — I472 Ventricular tachycardia: Secondary | ICD-10-CM | POA: Insufficient documentation

## 2014-02-16 DIAGNOSIS — Z9862 Peripheral vascular angioplasty status: Secondary | ICD-10-CM | POA: Insufficient documentation

## 2014-02-16 DIAGNOSIS — E785 Hyperlipidemia, unspecified: Secondary | ICD-10-CM | POA: Insufficient documentation

## 2014-02-16 DIAGNOSIS — I252 Old myocardial infarction: Secondary | ICD-10-CM | POA: Insufficient documentation

## 2014-02-16 DIAGNOSIS — I10 Essential (primary) hypertension: Secondary | ICD-10-CM | POA: Insufficient documentation

## 2014-02-16 DIAGNOSIS — Z5189 Encounter for other specified aftercare: Secondary | ICD-10-CM | POA: Insufficient documentation

## 2014-02-16 DIAGNOSIS — I2510 Atherosclerotic heart disease of native coronary artery without angina pectoris: Secondary | ICD-10-CM | POA: Insufficient documentation

## 2014-02-18 ENCOUNTER — Encounter (HOSPITAL_COMMUNITY)
Admission: RE | Admit: 2014-02-18 | Discharge: 2014-02-18 | Disposition: A | Discharge status: Home / Self Care | Payer: Self-pay | Source: Ambulatory Visit | Attending: Interventional Cardiology | Admitting: Interventional Cardiology

## 2014-02-21 ENCOUNTER — Encounter (HOSPITAL_COMMUNITY)
Admission: RE | Admit: 2014-02-21 | Discharge: 2014-02-21 | Disposition: A | Discharge status: Home / Self Care | Payer: Self-pay | Source: Ambulatory Visit | Attending: Interventional Cardiology | Admitting: Interventional Cardiology

## 2014-02-23 ENCOUNTER — Encounter (HOSPITAL_COMMUNITY)
Admission: RE | Admit: 2014-02-23 | Discharge: 2014-02-23 | Disposition: A | Discharge status: Home / Self Care | Payer: Self-pay | Source: Ambulatory Visit | Attending: Interventional Cardiology | Admitting: Interventional Cardiology

## 2014-02-24 ENCOUNTER — Encounter (HOSPITAL_COMMUNITY): Payer: Self-pay | Admitting: Cardiovascular Disease

## 2014-02-25 ENCOUNTER — Encounter (HOSPITAL_COMMUNITY)
Admission: RE | Admit: 2014-02-25 | Discharge: 2014-02-25 | Disposition: A | Discharge status: Home / Self Care | Payer: Self-pay | Source: Ambulatory Visit | Attending: Interventional Cardiology | Admitting: Interventional Cardiology

## 2014-02-28 ENCOUNTER — Encounter (HOSPITAL_COMMUNITY)
Admission: RE | Admit: 2014-02-28 | Discharge: 2014-02-28 | Disposition: A | Discharge status: Home / Self Care | Payer: Self-pay | Source: Ambulatory Visit | Attending: Interventional Cardiology | Admitting: Interventional Cardiology

## 2014-03-02 ENCOUNTER — Encounter (HOSPITAL_COMMUNITY)
Admission: RE | Admit: 2014-03-02 | Discharge: 2014-03-02 | Disposition: A | Discharge status: Home / Self Care | Payer: Self-pay | Source: Ambulatory Visit | Attending: Interventional Cardiology | Admitting: Interventional Cardiology

## 2014-03-03 ENCOUNTER — Ambulatory Visit (INDEPENDENT_AMBULATORY_CARE_PROVIDER_SITE_OTHER): Payer: Medicare Other | Admitting: Interventional Cardiology

## 2014-03-03 ENCOUNTER — Encounter: Payer: Self-pay | Admitting: Interventional Cardiology

## 2014-03-03 VITALS — BP 130/78 | HR 60 | Ht 66.0 in | Wt 203.4 lb

## 2014-03-03 DIAGNOSIS — E785 Hyperlipidemia, unspecified: Secondary | ICD-10-CM | POA: Diagnosis not present

## 2014-03-03 DIAGNOSIS — I2510 Atherosclerotic heart disease of native coronary artery without angina pectoris: Secondary | ICD-10-CM

## 2014-03-03 DIAGNOSIS — I10 Essential (primary) hypertension: Secondary | ICD-10-CM | POA: Diagnosis not present

## 2014-03-03 DIAGNOSIS — I252 Old myocardial infarction: Secondary | ICD-10-CM

## 2014-03-03 DIAGNOSIS — I2119 ST elevation (STEMI) myocardial infarction involving other coronary artery of inferior wall: Secondary | ICD-10-CM | POA: Diagnosis not present

## 2014-03-03 HISTORY — DX: Old myocardial infarction: I25.2

## 2014-03-03 NOTE — Patient Instructions (Signed)
Your physician recommends that you continue on your current medications as directed. Please refer to the Current Medication list given to you today.  Your physician wants you to follow-up in: 1 year with Dr. Varanasi. You will receive a reminder letter in the mail two months in advance. If you don't receive a letter, please call our office to schedule the follow-up appointment.  

## 2014-03-03 NOTE — Progress Notes (Signed)
Patient ID: [name], female   DOB: November 01, 1940, 73 y.o.   MRN: [id number]    [place], [place], [place]  [address] Phone: [phone] Fax:  [phone]  Date:  03/03/2014   ID:  [name], [name] 16-Dec-1940, MRN [id number]  PCP:  [name], MD      History of Present Illness: [name] is a 73 y.o. female who had an inferoposterior MI in January 2015. She underwent bare-metal stent placement to her circumflex. She tolerated the procedure well but then did develop chest discomfort and ventricular tachycardia later that night. She had a repeat cardiac catheterization the next day which revealed a patent circumflex stent. She stayed in the hospital for a few more days and went home. Since coming home, she reported some dyspnea on exertion which is more than usual. This improved after switching Brilinta to Effient.  She developed a rash, particularly on her back that itched.  She thinks it was related to the contrast and it resolved. Eventually, she was switched to clopidogrel.  No further chest discomfort. She has not used any nitroglycerin. She is participating in cardiac rehabilitation.  No sx lke her MI.  She has varicose veins but they are not painful.  She has some knee pain which limits the intensity of her exercise. Weight loss has been slow.    Wt Readings from Last 3 Encounters:  03/03/14 203 lb 6.4 oz (92.262 kg)  12/17/13 205 lb (92.987 kg)  09/09/13 207 lb (93.895 kg)     Past Medical History  Diagnosis Date  . IV Contrast Allergy   . GERD (gastroesophageal reflux disease)   . Hypothyroidism   . Rosacea   . Hearing loss     a. s/p cochlear implant on right, hearing aid on left.  . Obesity   . Fibroids     a. uterine - spotting noted since 03/11/2013.  Marland Kitchen CAD (coronary artery disease)     a. 03/2013 Infpost STEMI/PCI: LM nl, LAD min irregs, LCX 100 (3.0x12 Vision BMS), RCA  mild ostial dzs, EF 55%.  . Essential hypertension,  benign   . Ventricular tachycardia     a. Immediate post pci/MI - no complications, on BB.  Marland Kitchen Hyperlipidemia   . Myocardial infarction     Current Outpatient Prescriptions  Medication Sig Dispense Refill  . aspirin 81 MG tablet Take 81 mg by mouth daily.      Marland Kitchen atorvastatin (LIPITOR) 80 MG tablet Take 0.5 tablets (40 mg total) by mouth daily at 6 PM. 30 tablet 6  . Calcium Carb-Cholecalciferol (CALCIUM + D3) 600-200 MG-UNIT TABS Take 1 tablet by mouth daily.    . clopidogrel (PLAVIX) 75 MG tablet Take 1 tablet (75 mg total) by mouth daily. 30 tablet 11  . FUROSEMIDE PO Take 20 mg by mouth as needed (Directed to take with KDur for weight gain.). unknown    . levothyroxine (SYNTHROID, LEVOTHROID) 100 MCG tablet Take 100 mcg by mouth daily before breakfast.    . metoprolol succinate (TOPROL-XL) 25 MG 24 hr tablet TAKE 1 TABLET DAILY. 30 tablet 6  . metroNIDAZOLE (METROGEL) 0.75 % gel Apply 1 application topically daily. Patient places on her cheeks and nose, only about once or twice a week depending on the climate.    . nitroGLYCERIN (NITROSTAT) 0.4 MG SL tablet Place 1 tablet (0.4 mg total) under the tongue every 5 (five) minutes x 3 doses as needed for chest pain.  25 tablet 3  . omega-3 acid ethyl esters (LOVAZA) 1 G capsule Take 1 g by mouth daily.    . pantoprazole (PROTONIX) 40 MG tablet Take 1 tablet (40 mg total) by mouth daily. 30 tablet 11  . potassium chloride SA (K-DUR,KLOR-CON) 20 MEQ tablet Take 20 mEq by mouth daily.      No current facility-administered medications for this visit.    Allergies:    Allergies  Allergen Reactions  . Bee Venom Itching and Swelling  . Iohexol Hives    Social History:  The patient  reports that she quit smoking about 45 years ago. Her smoking use included Cigarettes. She has a .4 pack-year smoking history. She does not have any smokeless tobacco history on file. She reports that she does not drink alcohol or use illicit drugs.   Family  History:  The patient's family history includes Cancer in her mother; Colon cancer in her cousin; Diabetes in her mother and sister; Heart attack in her maternal grandmother and mother; Heart disease in her mother; Heart failure in her mother; Hyperlipidemia in her sister; Hypertension in her mother; Other in her father. There is no history of Esophageal cancer, Stomach cancer, or Stroke.   ROS:  Please see the history of present illness.  No nausea, vomiting.  No fevers, chills.  No focal weakness.  No dysuria. Rash.   All other systems reviewed and negative.   PHYSICAL EXAM: VS:  BP 130/78 mmHg  Pulse 60  Ht 5\' 6"  (1.676 m)  Wt 203 lb 6.4 oz (92.262 kg)  BMI 32.85 kg/m2 Well nourished, well developed, in no acute distress HEENT: normal Neck: no JVD, no carotid bruits Cardiac:  normal S1, S2; RRR;  Lungs:  clear to auscultation bilaterally, no wheezing, rhonchi or rales Abd: soft, nontender, no hepatomegaly Ext: no edema, 2+ radial pulses bilaterally.  Skin: warm and dry; macular, erythematous rash all over her back and sides of her legs Neuro:   no focal abnormalities noted Psych: normal affect      ASSESSMENT AND PLAN:   1. Prior inferior MI/CAD: Bare-metal stent used due to some uterine bleeding that she's had.   SHe is already on DAPT and statin. No bleeding problems.  Could stop aspirin after Jan 2016.  She will stay on it for now since bleeding has not been an issue.  2. Hyperlipidemia: LDL 117 at the time of the MI.  Improved with atorvastatin 40 mg daily. LDL 57 in 6/15. 3. Fluid overload/edema: This is improved. She will now take the furosemide as needed based on any weight gain or symptoms of fluid overload that she may have. Once every couple of weeks.   4. Hypertension: Reduced dose of ARB given some low blood pressure readings in the past.  BP now controlled.  Will go up if she eats salty food.  Watch salt intake. Avoid canned soups.  5. Obesity: She has lost some weight  recently.   6. Palpitations: Premature beats noted on physical examination. Likely PACs or PVCs. Nothing sustained.  If she has any more sustained symptoms, she will let us know. We'll plan for her usual followup in December 2016.   Preoperative eval:  If colonoscopy necessary, could hold plavix since she received a bare metal stent.  GI: Dr. Henrene Pastor.  Signed, Mina Marble, MD, [facility] 03/03/2014 3:06 PM

## 2014-03-04 ENCOUNTER — Encounter (HOSPITAL_COMMUNITY)
Admission: RE | Admit: 2014-03-04 | Discharge: 2014-03-04 | Disposition: A | Discharge status: Home / Self Care | Payer: Self-pay | Source: Ambulatory Visit | Attending: Interventional Cardiology | Admitting: Interventional Cardiology

## 2014-03-07 ENCOUNTER — Encounter (HOSPITAL_COMMUNITY)
Admission: RE | Admit: 2014-03-07 | Discharge: 2014-03-07 | Disposition: A | Discharge status: Home / Self Care | Payer: Self-pay | Source: Ambulatory Visit | Attending: Interventional Cardiology | Admitting: Interventional Cardiology

## 2014-03-09 ENCOUNTER — Encounter (HOSPITAL_COMMUNITY): Payer: Self-pay

## 2014-03-14 ENCOUNTER — Encounter (HOSPITAL_COMMUNITY)
Admission: RE | Admit: 2014-03-14 | Discharge: 2014-03-14 | Disposition: A | Discharge status: Home / Self Care | Payer: Self-pay | Source: Ambulatory Visit | Attending: Interventional Cardiology | Admitting: Interventional Cardiology

## 2014-03-16 ENCOUNTER — Encounter (HOSPITAL_COMMUNITY)
Admission: RE | Admit: 2014-03-16 | Discharge: 2014-03-16 | Disposition: A | Discharge status: Home / Self Care | Payer: Self-pay | Source: Ambulatory Visit | Attending: Interventional Cardiology | Admitting: Interventional Cardiology

## 2014-03-21 ENCOUNTER — Encounter (HOSPITAL_COMMUNITY)
Admission: RE | Admit: 2014-03-21 | Discharge: 2014-03-21 | Disposition: A | Discharge status: Home / Self Care | Payer: Self-pay | Source: Ambulatory Visit | Attending: Interventional Cardiology | Admitting: Interventional Cardiology

## 2014-03-21 DIAGNOSIS — Z9862 Peripheral vascular angioplasty status: Secondary | ICD-10-CM | POA: Insufficient documentation

## 2014-03-21 DIAGNOSIS — I2510 Atherosclerotic heart disease of native coronary artery without angina pectoris: Secondary | ICD-10-CM | POA: Insufficient documentation

## 2014-03-21 DIAGNOSIS — I10 Essential (primary) hypertension: Secondary | ICD-10-CM | POA: Insufficient documentation

## 2014-03-21 DIAGNOSIS — I252 Old myocardial infarction: Secondary | ICD-10-CM | POA: Insufficient documentation

## 2014-03-21 DIAGNOSIS — E785 Hyperlipidemia, unspecified: Secondary | ICD-10-CM | POA: Insufficient documentation

## 2014-03-21 DIAGNOSIS — I472 Ventricular tachycardia: Secondary | ICD-10-CM | POA: Insufficient documentation

## 2014-03-21 DIAGNOSIS — Z5189 Encounter for other specified aftercare: Secondary | ICD-10-CM | POA: Insufficient documentation

## 2014-03-22 ENCOUNTER — Other Ambulatory Visit: Payer: Self-pay | Admitting: Nurse Practitioner

## 2014-03-22 MED ORDER — NITROGLYCERIN 0.4 MG SL SUBL: 0.4000 mg | SUBLINGUAL_TABLET | SUBLINGUAL | Status: DC | PRN

## 2014-03-23 ENCOUNTER — Encounter (HOSPITAL_COMMUNITY)
Admission: RE | Admit: 2014-03-23 | Discharge: 2014-03-23 | Disposition: A | Discharge status: Home / Self Care | Payer: Self-pay | Source: Ambulatory Visit | Attending: Interventional Cardiology | Admitting: Interventional Cardiology

## 2014-03-25 ENCOUNTER — Encounter (HOSPITAL_COMMUNITY): Payer: Self-pay

## 2014-03-28 ENCOUNTER — Encounter (HOSPITAL_COMMUNITY)
Admission: RE | Admit: 2014-03-28 | Discharge: 2014-03-28 | Disposition: A | Discharge status: Home / Self Care | Payer: Self-pay | Source: Ambulatory Visit | Attending: Interventional Cardiology | Admitting: Interventional Cardiology

## 2014-03-30 ENCOUNTER — Encounter (HOSPITAL_COMMUNITY): Payer: Self-pay

## 2014-04-01 ENCOUNTER — Encounter (HOSPITAL_COMMUNITY): Payer: Self-pay

## 2014-04-06 ENCOUNTER — Encounter (HOSPITAL_COMMUNITY): Payer: Self-pay

## 2014-04-08 ENCOUNTER — Encounter (HOSPITAL_COMMUNITY): Payer: Self-pay

## 2014-04-11 ENCOUNTER — Encounter (HOSPITAL_COMMUNITY): Payer: Self-pay

## 2014-04-13 ENCOUNTER — Encounter: Payer: Self-pay | Admitting: Internal Medicine

## 2014-04-13 ENCOUNTER — Encounter (HOSPITAL_COMMUNITY): Payer: Self-pay

## 2014-04-15 ENCOUNTER — Encounter (HOSPITAL_COMMUNITY): Payer: Self-pay

## 2014-04-18 ENCOUNTER — Encounter (HOSPITAL_COMMUNITY): Payer: Medicare Other

## 2014-04-18 DIAGNOSIS — Z9862 Peripheral vascular angioplasty status: Secondary | ICD-10-CM | POA: Insufficient documentation

## 2014-04-18 DIAGNOSIS — Z5189 Encounter for other specified aftercare: Secondary | ICD-10-CM | POA: Insufficient documentation

## 2014-04-18 DIAGNOSIS — I2510 Atherosclerotic heart disease of native coronary artery without angina pectoris: Secondary | ICD-10-CM | POA: Insufficient documentation

## 2014-04-18 DIAGNOSIS — E785 Hyperlipidemia, unspecified: Secondary | ICD-10-CM | POA: Insufficient documentation

## 2014-04-18 DIAGNOSIS — I10 Essential (primary) hypertension: Secondary | ICD-10-CM | POA: Insufficient documentation

## 2014-04-18 DIAGNOSIS — I252 Old myocardial infarction: Secondary | ICD-10-CM | POA: Insufficient documentation

## 2014-04-18 DIAGNOSIS — I472 Ventricular tachycardia: Secondary | ICD-10-CM | POA: Insufficient documentation

## 2014-04-20 ENCOUNTER — Encounter (HOSPITAL_COMMUNITY): Payer: Self-pay

## 2014-04-22 ENCOUNTER — Encounter (HOSPITAL_COMMUNITY): Payer: Self-pay

## 2014-04-25 ENCOUNTER — Encounter (HOSPITAL_COMMUNITY)
Admission: RE | Admit: 2014-04-25 | Discharge: 2014-04-25 | Disposition: A | Discharge status: Home / Self Care | Payer: Self-pay | Source: Ambulatory Visit | Attending: Interventional Cardiology | Admitting: Interventional Cardiology

## 2014-04-27 ENCOUNTER — Encounter (HOSPITAL_COMMUNITY): Payer: Self-pay

## 2014-04-29 ENCOUNTER — Encounter (HOSPITAL_COMMUNITY): Payer: Self-pay

## 2014-05-02 ENCOUNTER — Encounter (HOSPITAL_COMMUNITY): Payer: Self-pay

## 2014-05-04 ENCOUNTER — Encounter (HOSPITAL_COMMUNITY)
Admission: RE | Admit: 2014-05-04 | Discharge: 2014-05-04 | Disposition: A | Discharge status: Home / Self Care | Payer: Self-pay | Source: Ambulatory Visit | Attending: Interventional Cardiology | Admitting: Interventional Cardiology

## 2014-05-06 ENCOUNTER — Encounter (HOSPITAL_COMMUNITY)
Admission: RE | Admit: 2014-05-06 | Discharge: 2014-05-06 | Disposition: A | Discharge status: Home / Self Care | Payer: Self-pay | Source: Ambulatory Visit | Attending: Interventional Cardiology | Admitting: Interventional Cardiology

## 2014-05-09 ENCOUNTER — Encounter (HOSPITAL_COMMUNITY)
Admission: RE | Admit: 2014-05-09 | Discharge: 2014-05-09 | Disposition: A | Discharge status: Home / Self Care | Payer: Self-pay | Source: Ambulatory Visit | Attending: Interventional Cardiology | Admitting: Interventional Cardiology

## 2014-05-11 ENCOUNTER — Encounter (HOSPITAL_COMMUNITY): Payer: Self-pay

## 2014-05-13 ENCOUNTER — Encounter (HOSPITAL_COMMUNITY)
Admission: RE | Admit: 2014-05-13 | Discharge: 2014-05-13 | Disposition: A | Discharge status: Home / Self Care | Payer: Self-pay | Source: Ambulatory Visit | Attending: Interventional Cardiology | Admitting: Interventional Cardiology

## 2014-05-16 ENCOUNTER — Encounter (HOSPITAL_COMMUNITY)
Admission: RE | Admit: 2014-05-16 | Discharge: 2014-05-16 | Disposition: A | Discharge status: Home / Self Care | Payer: Self-pay | Source: Ambulatory Visit | Attending: Interventional Cardiology | Admitting: Interventional Cardiology

## 2014-05-17 DIAGNOSIS — L820 Inflamed seborrheic keratosis: Secondary | ICD-10-CM | POA: Diagnosis not present

## 2014-05-17 DIAGNOSIS — B079 Viral wart, unspecified: Secondary | ICD-10-CM | POA: Diagnosis not present

## 2014-05-17 DIAGNOSIS — L304 Erythema intertrigo: Secondary | ICD-10-CM | POA: Diagnosis not present

## 2014-05-18 ENCOUNTER — Encounter (HOSPITAL_COMMUNITY)
Admission: RE | Admit: 2014-05-18 | Discharge: 2014-05-18 | Disposition: A | Discharge status: Home / Self Care | Payer: Self-pay | Source: Ambulatory Visit | Attending: Interventional Cardiology | Admitting: Interventional Cardiology

## 2014-05-18 DIAGNOSIS — Z1231 Encounter for screening mammogram for malignant neoplasm of breast: Secondary | ICD-10-CM | POA: Diagnosis not present

## 2014-05-18 DIAGNOSIS — I10 Essential (primary) hypertension: Secondary | ICD-10-CM | POA: Insufficient documentation

## 2014-05-18 DIAGNOSIS — I252 Old myocardial infarction: Secondary | ICD-10-CM | POA: Insufficient documentation

## 2014-05-18 DIAGNOSIS — E785 Hyperlipidemia, unspecified: Secondary | ICD-10-CM | POA: Insufficient documentation

## 2014-05-18 DIAGNOSIS — I472 Ventricular tachycardia: Secondary | ICD-10-CM | POA: Insufficient documentation

## 2014-05-18 DIAGNOSIS — Z5189 Encounter for other specified aftercare: Secondary | ICD-10-CM | POA: Insufficient documentation

## 2014-05-18 DIAGNOSIS — I2510 Atherosclerotic heart disease of native coronary artery without angina pectoris: Secondary | ICD-10-CM | POA: Insufficient documentation

## 2014-05-18 DIAGNOSIS — Z9862 Peripheral vascular angioplasty status: Secondary | ICD-10-CM | POA: Insufficient documentation

## 2014-05-20 ENCOUNTER — Encounter (HOSPITAL_COMMUNITY)
Admission: RE | Admit: 2014-05-20 | Discharge: 2014-05-20 | Disposition: A | Discharge status: Home / Self Care | Payer: Self-pay | Source: Ambulatory Visit | Attending: Interventional Cardiology | Admitting: Interventional Cardiology

## 2014-05-23 ENCOUNTER — Other Ambulatory Visit: Payer: Self-pay | Admitting: Cardiovascular Disease

## 2014-05-23 ENCOUNTER — Encounter (HOSPITAL_COMMUNITY)
Admission: RE | Admit: 2014-05-23 | Discharge: 2014-05-23 | Disposition: A | Discharge status: Home / Self Care | Payer: Self-pay | Source: Ambulatory Visit | Attending: Interventional Cardiology | Admitting: Interventional Cardiology

## 2014-05-25 ENCOUNTER — Encounter (HOSPITAL_COMMUNITY): Payer: Self-pay

## 2014-05-27 ENCOUNTER — Encounter (HOSPITAL_COMMUNITY)
Admission: RE | Admit: 2014-05-27 | Discharge: 2014-05-27 | Disposition: A | Discharge status: Home / Self Care | Payer: Self-pay | Source: Ambulatory Visit | Attending: Interventional Cardiology | Admitting: Interventional Cardiology

## 2014-05-30 ENCOUNTER — Encounter (HOSPITAL_COMMUNITY)
Admission: RE | Admit: 2014-05-30 | Discharge: 2014-05-30 | Disposition: A | Discharge status: Home / Self Care | Payer: Self-pay | Source: Ambulatory Visit | Attending: Interventional Cardiology | Admitting: Interventional Cardiology

## 2014-06-01 ENCOUNTER — Encounter (HOSPITAL_COMMUNITY)
Admission: RE | Admit: 2014-06-01 | Discharge: 2014-06-01 | Disposition: A | Discharge status: Home / Self Care | Payer: Self-pay | Source: Ambulatory Visit | Attending: Interventional Cardiology | Admitting: Interventional Cardiology

## 2014-06-03 ENCOUNTER — Encounter (HOSPITAL_COMMUNITY)
Admission: RE | Admit: 2014-06-03 | Discharge: 2014-06-03 | Disposition: A | Discharge status: Home / Self Care | Payer: Self-pay | Source: Ambulatory Visit | Attending: Interventional Cardiology | Admitting: Interventional Cardiology

## 2014-06-06 ENCOUNTER — Encounter (HOSPITAL_COMMUNITY)
Admission: RE | Admit: 2014-06-06 | Discharge: 2014-06-06 | Disposition: A | Discharge status: Home / Self Care | Payer: Self-pay | Source: Ambulatory Visit | Attending: Interventional Cardiology | Admitting: Interventional Cardiology

## 2014-06-07 ENCOUNTER — Other Ambulatory Visit: Payer: Self-pay | Admitting: Obstetrics and Gynecology

## 2014-06-07 DIAGNOSIS — N958 Other specified menopausal and perimenopausal disorders: Secondary | ICD-10-CM | POA: Diagnosis not present

## 2014-06-07 DIAGNOSIS — E039 Hypothyroidism, unspecified: Secondary | ICD-10-CM | POA: Diagnosis not present

## 2014-06-07 DIAGNOSIS — Z6832 Body mass index (BMI) 32.0-32.9, adult: Secondary | ICD-10-CM | POA: Diagnosis not present

## 2014-06-07 DIAGNOSIS — Z124 Encounter for screening for malignant neoplasm of cervix: Secondary | ICD-10-CM | POA: Diagnosis not present

## 2014-06-08 ENCOUNTER — Encounter (HOSPITAL_COMMUNITY): Payer: Self-pay

## 2014-06-09 LAB — CYTOLOGY - PAP

## 2014-06-10 ENCOUNTER — Encounter (HOSPITAL_COMMUNITY): Payer: Self-pay

## 2014-06-13 ENCOUNTER — Encounter (HOSPITAL_COMMUNITY)
Admission: RE | Admit: 2014-06-13 | Discharge: 2014-06-13 | Disposition: A | Discharge status: Home / Self Care | Payer: Self-pay | Source: Ambulatory Visit | Attending: Interventional Cardiology | Admitting: Interventional Cardiology

## 2014-06-14 ENCOUNTER — Telehealth: Payer: Self-pay | Admitting: Interventional Cardiology

## 2014-06-14 ENCOUNTER — Telehealth: Payer: Self-pay | Admitting: *Deleted

## 2014-06-14 ENCOUNTER — Encounter: Payer: Self-pay | Admitting: *Deleted

## 2014-06-14 NOTE — Telephone Encounter (Signed)
Sent My Chart message over to pt in regards to orders as pt preferred to correspond through email due to being [facility].

## 2014-06-14 NOTE — Telephone Encounter (Signed)
Walk in pt form " note left for Dr. Irish Lack" gave to [facility] B please email pt due to hearing loss  @ [email or web address]

## 2014-06-14 NOTE — Telephone Encounter (Signed)
Pt walked into office on 06/13/14 and filled out Walk In pt form. Pt states that hips, knees and lower back have been bothering her. Pt believes this may be due to her Atorvastatin. Pt has not taken Atorvastatin since last Tuesday evening. Resistance numbers at Rehab have been lower for several months. Pt states that bone density test was done and "was good." Pt states that she has hearing loss and prefers to be emailed ([email or web address]). Pt does have active MyChart account.  Will forward this information to Dr. Irish Lack for review and advisement.

## 2014-06-14 NOTE — Telephone Encounter (Signed)
OK to decrease atorvastatin to 40 mg daily.  Repeat lipids in 3 months.

## 2014-06-15 ENCOUNTER — Encounter (HOSPITAL_COMMUNITY)
Admission: RE | Admit: 2014-06-15 | Discharge: 2014-06-15 | Disposition: A | Discharge status: Home / Self Care | Payer: Self-pay | Source: Ambulatory Visit | Attending: Interventional Cardiology | Admitting: Interventional Cardiology

## 2014-06-17 ENCOUNTER — Encounter (HOSPITAL_COMMUNITY)
Admission: RE | Admit: 2014-06-17 | Discharge: 2014-06-17 | Disposition: A | Discharge status: Home / Self Care | Payer: Self-pay | Source: Ambulatory Visit | Attending: Interventional Cardiology | Admitting: Interventional Cardiology

## 2014-06-17 DIAGNOSIS — Z9862 Peripheral vascular angioplasty status: Secondary | ICD-10-CM | POA: Insufficient documentation

## 2014-06-17 DIAGNOSIS — I472 Ventricular tachycardia: Secondary | ICD-10-CM | POA: Insufficient documentation

## 2014-06-17 DIAGNOSIS — Z5189 Encounter for other specified aftercare: Secondary | ICD-10-CM | POA: Insufficient documentation

## 2014-06-17 DIAGNOSIS — I10 Essential (primary) hypertension: Secondary | ICD-10-CM | POA: Insufficient documentation

## 2014-06-17 DIAGNOSIS — I2510 Atherosclerotic heart disease of native coronary artery without angina pectoris: Secondary | ICD-10-CM | POA: Insufficient documentation

## 2014-06-17 DIAGNOSIS — E785 Hyperlipidemia, unspecified: Secondary | ICD-10-CM | POA: Insufficient documentation

## 2014-06-17 DIAGNOSIS — I252 Old myocardial infarction: Secondary | ICD-10-CM | POA: Insufficient documentation

## 2014-06-20 ENCOUNTER — Encounter (HOSPITAL_COMMUNITY)
Admission: RE | Admit: 2014-06-20 | Discharge: 2014-06-20 | Disposition: A | Discharge status: Home / Self Care | Payer: Self-pay | Source: Ambulatory Visit | Attending: Interventional Cardiology | Admitting: Interventional Cardiology

## 2014-06-22 ENCOUNTER — Encounter (HOSPITAL_COMMUNITY)
Admission: RE | Admit: 2014-06-22 | Discharge: 2014-06-22 | Disposition: A | Discharge status: Home / Self Care | Payer: Self-pay | Source: Ambulatory Visit | Attending: Interventional Cardiology | Admitting: Interventional Cardiology

## 2014-06-23 DIAGNOSIS — M25551 Pain in right hip: Secondary | ICD-10-CM | POA: Diagnosis not present

## 2014-06-23 DIAGNOSIS — M170 Bilateral primary osteoarthritis of knee: Secondary | ICD-10-CM | POA: Diagnosis not present

## 2014-06-23 DIAGNOSIS — M25552 Pain in left hip: Secondary | ICD-10-CM | POA: Diagnosis not present

## 2014-06-24 ENCOUNTER — Encounter (HOSPITAL_COMMUNITY): Payer: Self-pay

## 2014-06-25 ENCOUNTER — Emergency Department (HOSPITAL_COMMUNITY): Payer: Medicare Other

## 2014-06-25 ENCOUNTER — Encounter (HOSPITAL_COMMUNITY): Payer: Self-pay | Admitting: Emergency Medicine

## 2014-06-25 ENCOUNTER — Inpatient Hospital Stay (HOSPITAL_COMMUNITY)
Admission: EM | Admit: 2014-06-25 | Discharge: 2014-06-30 | DRG: 760 | Disposition: A | Discharge status: Home / Self Care | Payer: Medicare Other | Attending: Internal Medicine | Admitting: Internal Medicine

## 2014-06-25 DIAGNOSIS — R030 Elevated blood-pressure reading, without diagnosis of hypertension: Secondary | ICD-10-CM | POA: Diagnosis not present

## 2014-06-25 DIAGNOSIS — R740 Nonspecific elevation of levels of transaminase and lactic acid dehydrogenase [LDH]: Secondary | ICD-10-CM | POA: Diagnosis not present

## 2014-06-25 DIAGNOSIS — K219 Gastro-esophageal reflux disease without esophagitis: Secondary | ICD-10-CM | POA: Diagnosis present

## 2014-06-25 DIAGNOSIS — Z7982 Long term (current) use of aspirin: Secondary | ICD-10-CM

## 2014-06-25 DIAGNOSIS — O208 Other hemorrhage in early pregnancy: Secondary | ICD-10-CM | POA: Diagnosis not present

## 2014-06-25 DIAGNOSIS — I951 Orthostatic hypotension: Secondary | ICD-10-CM | POA: Diagnosis not present

## 2014-06-25 DIAGNOSIS — E039 Hypothyroidism, unspecified: Secondary | ICD-10-CM | POA: Diagnosis present

## 2014-06-25 DIAGNOSIS — R55 Syncope and collapse: Secondary | ICD-10-CM | POA: Diagnosis not present

## 2014-06-25 DIAGNOSIS — N83202 Unspecified ovarian cyst, left side: Secondary | ICD-10-CM

## 2014-06-25 DIAGNOSIS — I252 Old myocardial infarction: Secondary | ICD-10-CM | POA: Diagnosis not present

## 2014-06-25 DIAGNOSIS — R42 Dizziness and giddiness: Secondary | ICD-10-CM | POA: Diagnosis not present

## 2014-06-25 DIAGNOSIS — Z91041 Radiographic dye allergy status: Secondary | ICD-10-CM

## 2014-06-25 DIAGNOSIS — D696 Thrombocytopenia, unspecified: Secondary | ICD-10-CM | POA: Diagnosis present

## 2014-06-25 DIAGNOSIS — E669 Obesity, unspecified: Secondary | ICD-10-CM | POA: Diagnosis present

## 2014-06-25 DIAGNOSIS — G454 Transient global amnesia: Secondary | ICD-10-CM | POA: Insufficient documentation

## 2014-06-25 DIAGNOSIS — D62 Acute posthemorrhagic anemia: Secondary | ICD-10-CM | POA: Diagnosis not present

## 2014-06-25 DIAGNOSIS — R7989 Other specified abnormal findings of blood chemistry: Secondary | ICD-10-CM

## 2014-06-25 DIAGNOSIS — I10 Essential (primary) hypertension: Secondary | ICD-10-CM | POA: Diagnosis not present

## 2014-06-25 DIAGNOSIS — Z6832 Body mass index (BMI) 32.0-32.9, adult: Secondary | ICD-10-CM

## 2014-06-25 DIAGNOSIS — N950 Postmenopausal bleeding: Secondary | ICD-10-CM | POA: Diagnosis not present

## 2014-06-25 DIAGNOSIS — Z955 Presence of coronary angioplasty implant and graft: Secondary | ICD-10-CM

## 2014-06-25 DIAGNOSIS — H9190 Unspecified hearing loss, unspecified ear: Secondary | ICD-10-CM | POA: Diagnosis present

## 2014-06-25 DIAGNOSIS — N939 Abnormal uterine and vaginal bleeding, unspecified: Secondary | ICD-10-CM | POA: Diagnosis present

## 2014-06-25 DIAGNOSIS — R412 Retrograde amnesia: Secondary | ICD-10-CM | POA: Diagnosis present

## 2014-06-25 DIAGNOSIS — I2510 Atherosclerotic heart disease of native coronary artery without angina pectoris: Secondary | ICD-10-CM | POA: Diagnosis present

## 2014-06-25 DIAGNOSIS — R413 Other amnesia: Secondary | ICD-10-CM | POA: Diagnosis not present

## 2014-06-25 DIAGNOSIS — R410 Disorientation, unspecified: Secondary | ICD-10-CM | POA: Diagnosis not present

## 2014-06-25 DIAGNOSIS — Z7902 Long term (current) use of antithrombotics/antiplatelets: Secondary | ICD-10-CM

## 2014-06-25 DIAGNOSIS — Z87891 Personal history of nicotine dependence: Secondary | ICD-10-CM

## 2014-06-25 DIAGNOSIS — Z91030 Bee allergy status: Secondary | ICD-10-CM

## 2014-06-25 DIAGNOSIS — E785 Hyperlipidemia, unspecified: Secondary | ICD-10-CM | POA: Diagnosis present

## 2014-06-25 DIAGNOSIS — Z9049 Acquired absence of other specified parts of digestive tract: Secondary | ICD-10-CM | POA: Diagnosis present

## 2014-06-25 LAB — CBC WITH DIFFERENTIAL/PLATELET
BASOS ABS: 0 10*3/uL (ref 0.0–0.1)
BASOS PCT: 0 % (ref 0–1)
EOS ABS: 0.5 10*3/uL (ref 0.0–0.7)
Eosinophils Relative: 5 % (ref 0–5)
HEMATOCRIT: 41.8 % (ref 36.0–46.0)
Hemoglobin: 13.8 g/dL (ref 12.0–15.0)
LYMPHS PCT: 13 % (ref 12–46)
Lymphs Abs: 1.3 10*3/uL (ref 0.7–4.0)
MCH: 29.9 pg (ref 26.0–34.0)
MCHC: 33 g/dL (ref 30.0–36.0)
MCV: 90.5 fL (ref 78.0–100.0)
MONO ABS: 0.7 10*3/uL (ref 0.1–1.0)
MONOS PCT: 7 % (ref 3–12)
NEUTROS ABS: 7.1 10*3/uL (ref 1.7–7.7)
Neutrophils Relative %: 75 % (ref 43–77)
Platelets: 189 10*3/uL (ref 150–400)
RBC: 4.62 MIL/uL (ref 3.87–5.11)
RDW: 13 % (ref 11.5–15.5)
WBC: 9.6 10*3/uL (ref 4.0–10.5)

## 2014-06-25 LAB — COMPREHENSIVE METABOLIC PANEL
ALK PHOS: 67 U/L (ref 39–117)
ALT: 17 U/L (ref 0–35)
AST: 26 U/L (ref 0–37)
Albumin: 4 g/dL (ref 3.5–5.2)
Anion gap: 10 (ref 5–15)
BILIRUBIN TOTAL: 0.4 mg/dL (ref 0.3–1.2)
BUN: 19 mg/dL (ref 6–23)
CALCIUM: 9.2 mg/dL (ref 8.4–10.5)
CO2: 25 mmol/L (ref 19–32)
CREATININE: 0.81 mg/dL (ref 0.50–1.10)
Chloride: 101 mmol/L (ref 96–112)
GFR calc Af Amer: 83 mL/min — ABNORMAL LOW (ref >90)
GFR calc non Af Amer: 71 mL/min — ABNORMAL LOW (ref >90)
Glucose, Bld: 125 mg/dL — ABNORMAL HIGH (ref 70–99)
POTASSIUM: 3.6 mmol/L (ref 3.5–5.1)
Sodium: 136 mmol/L (ref 135–145)
TOTAL PROTEIN: 6.8 g/dL (ref 6.0–8.3)

## 2014-06-25 LAB — SAMPLE TO BLOOD BANK

## 2014-06-25 LAB — I-STAT CG4 LACTIC ACID, ED: Lactic Acid, Venous: 2.54 mmol/L (ref 0.5–2.0)

## 2014-06-25 LAB — PROTIME-INR
INR: 1.1 (ref 0.00–1.49)
Prothrombin Time: 14.4 seconds (ref 11.6–15.2)

## 2014-06-25 LAB — APTT: APTT: 29 s (ref 24–37)

## 2014-06-25 LAB — WET PREP, GENITAL
Clue Cells Wet Prep HPF POC: NONE SEEN
Trich, Wet Prep: NONE SEEN
YEAST WET PREP: NONE SEEN

## 2014-06-25 MED ORDER — SODIUM CHLORIDE 0.9 % IV BOLUS (SEPSIS)
1000.0000 mL | Freq: Once | INTRAVENOUS | Status: AC
  Administered 2014-06-25: 1000 mL via INTRAVENOUS

## 2014-06-25 MED ORDER — MEDROXYPROGESTERONE ACETATE 10 MG PO TABS
10.0000 mg | ORAL_TABLET | Freq: Every day | ORAL | Status: DC
  Administered 2014-06-25: 10 mg via ORAL
  Filled 2014-06-25 (×2): qty 1

## 2014-06-25 NOTE — ED Notes (Signed)
Patient returned to room from CT scan without distress noted.

## 2014-06-25 NOTE — ED Notes (Signed)
Awake. Verbally responsive. Resp even and unlabored. No audible adventitious breath sounds noted. ABC's intact. Abd soft/nondistended but tender to palpate. BS (+) and active x4 quadrants. No N/V/D reported. Pt ambulated to BR. Gait steady.

## 2014-06-25 NOTE — ED Notes (Signed)
Patient transported to CT 

## 2014-06-25 NOTE — ED Notes (Signed)
Pt from home, deaf and read lips, has an implant,  Pt sts dizziness per EMS but steady on feet.   Pt did pass a large clot and is on blood thinners.  Ax4.  Denies pain and tenderness.  On Plavix

## 2014-06-25 NOTE — ED Notes (Addendum)
Pt reported passing blood clots vaginal and anxiety since 1630. Pt reported taking Plavix 75mg  daily.

## 2014-06-25 NOTE — ED Provider Notes (Signed)
CSN: [id number]     Arrival date & time 06/25/14  1928 History   First MD Initiated Contact with Patient 06/25/14 1949     Chief Complaint  Patient presents with  . Vaginal Bleeding     (Consider location/radiation/quality/duration/timing/severity/associated sxs/prior Treatment) HPI   [name] is a 74 y.o. female with past medical history significant for CAD status post MI 1 year ago, taking Plavix complaining of acute onset of heavy vaginal bleeding and passage of large and many clots at 4 PM today. She has changed her pad 3 times in 4 hours. She endorses a mild lower abdominal cramping and lightheaded sensation. She denies chest pain, shortness of breath, palpitations, syncope. Patient has a history of fibroids and has intermittent very light spotting approximately twice per year, she's never had bleeding like this. She denies unintentional weight loss (she lost 40 pounds intentionally after her MI) night sweats, easy bruising or bleeding above her baseline recently.  PCP [facility]: [place]  Past Medical History  Diagnosis Date  . IV Contrast Allergy   . GERD (gastroesophageal reflux disease)   . Hypothyroidism   . Rosacea   . Hearing loss     a. s/p cochlear implant on right, hearing aid on left.  . Obesity   . Fibroids     a. uterine - spotting noted since 03/11/2013.  Marland Kitchen CAD (coronary artery disease)     a. 03/2013 Infpost STEMI/PCI: LM nl, LAD min irregs, LCX 100 (3.0x12 Vision BMS), RCA  mild ostial dzs, EF 55%.  . Essential hypertension, benign   . Ventricular tachycardia     a. Immediate post pci/MI - no complications, on BB.  Marland Kitchen Hyperlipidemia   . Myocardial infarction    Past Surgical History  Procedure Laterality Date  . Cochlear implant    . Upper gastrointestinal endoscopy    . Cervical polypectomy      2010  . Cholecystectomy    . Uterine polyp      had D and C done to remove the polyp  . Left heart catheterization with coronary angiogram N/A  03/25/2013    Procedure: LEFT HEART CATHETERIZATION WITH CORONARY ANGIOGRAM;  Surgeon: Burnell Blanks, MD;  Location: [facility] CATH LAB;  Service: Cardiovascular;  Laterality: N/A;   Family History  Problem Relation Age of Onset  . Esophageal cancer Neg Hx   . Stomach cancer Neg Hx   . Colon cancer Cousin   . Heart failure Mother     died in her 57's.  . Cancer Mother   . Diabetes Mother   . Heart disease Mother   . Hypertension Mother   . Other Father     died in WWII  . Hyperlipidemia Sister   . Diabetes Sister   . Heart attack Mother   . Heart attack Maternal Grandmother   . Stroke Neg Hx    History  Substance Use Topics  . Smoking status: Former Smoker -- 0.20 packs/day for 2 years    Types: Cigarettes    Quit date: 03/18/1968  . Smokeless tobacco: Not on file  . Alcohol Use: No   OB History    No data available     Review of Systems  10 systems reviewed and found to be negative, except as noted in the HPI.   Allergies  Bee venom and Iohexol  Home Medications   Prior to Admission medications   Medication Sig Start Date End Date Taking? Authorizing Provider  aspirin 81  MG tablet Take 81 mg by mouth daily.     Yes Historical Provider, MD  atorvastatin (LIPITOR) 80 MG tablet TAKE 1 TABLET IN THE EVENING AT 6PM. 03/22/14  Yes Jettie Booze, MD  Calcium Carb-Cholecalciferol (CALCIUM + D3) 600-200 MG-UNIT TABS Take 1 tablet by mouth daily.   Yes Historical Provider, MD  clopidogrel (PLAVIX) 75 MG tablet Take 1 tablet (75 mg total) by mouth daily. 09/09/13  Yes Jettie Booze, MD  Diclofenac Sodium (VOLTAREN EX) Apply 1 application topically 2 (two) times daily. Apply to both knees.   Yes Historical Provider, MD  FUROSEMIDE PO Take 20 mg by mouth daily as needed (weight pain). unknown   Yes Historical Provider, MD  levothyroxine (SYNTHROID, LEVOTHROID) 100 MCG tablet Take 100 mcg by mouth daily before breakfast.   Yes Historical Provider, MD  metoprolol  succinate (TOPROL-XL) 25 MG 24 hr tablet TAKE 1 TABLET DAILY. 05/23/14  Yes Burnell Blanks, MD  metroNIDAZOLE (METROGEL) 0.75 % gel Apply 1 application topically daily. Patient places on her cheeks and nose, only about once or twice a week depending on the climate.   Yes Historical Provider, MD  omega-3 acid ethyl esters (LOVAZA) 1 G capsule Take 1 g by mouth daily.   Yes Historical Provider, MD  pantoprazole (PROTONIX) 40 MG tablet Take 1 tablet (40 mg total) by mouth daily. 09/09/13  Yes Jettie Booze, MD  potassium chloride SA (K-DUR,KLOR-CON) 20 MEQ tablet Take 20 mEq by mouth daily as needed (potassium).  02/24/14  Yes Historical Provider, MD  medroxyPROGESTERone (PROVERA) 10 MG tablet Take 1 tablet (10 mg total) by mouth 2 (two) times daily. 06/25/14   Lafaye Mcelmurry, PA-C  nitroGLYCERIN (NITROSTAT) 0.4 MG SL tablet Place 1 tablet (0.4 mg total) under the tongue every 5 (five) minutes x 3 doses as needed for chest pain. 03/22/14   Jettie Booze, MD   BP 192/81 mmHg  Pulse 98  Temp(Src) 97.8 F (36.6 C) (Oral)  Resp 22  SpO2 96% Physical Exam  Constitutional: She is oriented to person, place, and time. She appears well-developed and well-nourished. No distress.  HENT:  Head: Normocephalic and atraumatic.  Mouth/Throat: Oropharynx is clear and moist.  Eyes: Conjunctivae and EOM are normal. Pupils are equal, round, and reactive to light.  Cardiovascular: Normal rate, regular rhythm and intact distal pulses.   Pulmonary/Chest: Effort normal and breath sounds normal. No stridor. No respiratory distress. She has no wheezes. She has no rales. She exhibits no tenderness.  Abdominal: Soft. Bowel sounds are normal. She exhibits no distension and no mass. There is no tenderness. There is no rebound and no guarding.  Genitourinary:   Quarter size clots pooled in the posterior fornix. After blood is cleared it re-collects in approximately 1 minute.  Musculoskeletal: Normal range of  motion.  Neurological: She is alert and oriented to person, place, and time.  Psychiatric: She has a normal mood and affect.  Nursing note and vitals reviewed.   ED Course  Procedures (including critical care time) Labs Review Labs Reviewed  WET PREP, GENITAL - Abnormal; Notable for the following:    WBC, Wet Prep HPF POC RARE (*)    All other components within normal limits  COMPREHENSIVE METABOLIC PANEL - Abnormal; Notable for the following:    Glucose, Bld 125 (*)    GFR calc non Af Amer 71 (*)    GFR calc Af Amer 83 (*)    All other components within normal limits  I-STAT CG4 LACTIC ACID, ED - Abnormal; Notable for the following:    Lactic Acid, Venous 2.54 (*)    All other components within normal limits  URINE CULTURE  CBC WITH DIFFERENTIAL/PLATELET  PROTIME-INR  APTT  URINALYSIS, ROUTINE W REFLEX MICROSCOPIC  I-STAT CG4 LACTIC ACID, ED  SAMPLE TO BLOOD BANK    Imaging Review Dg Chest 2 View  06/26/2014   CLINICAL DATA:  Elevated lactic acid.  Confusion.  EXAM: CHEST  2 VIEW  COMPARISON:  04/08/2013  FINDINGS: Normal heart size and pulmonary vascularity. No focal airspace disease or consolidation in the lungs. No blunting of costophrenic angles. No pneumothorax. Mediastinal contours appear intact. Degenerative changes in the spine.  IMPRESSION: No active cardiopulmonary disease.   Electronically Signed   By: Lucienne Capers M.D.   On: 06/26/2014 00:06   Ct Head Wo Contrast  06/25/2014   CLINICAL DATA:  Dizziness and confusion. Short-term memory loss. Patient is deaf with implant.  EXAM: CT HEAD WITHOUT CONTRAST  TECHNIQUE: Contiguous axial images were obtained from the base of the skull through the vertex without intravenous contrast.  COMPARISON:  CT temporal bones 12/22/2007  FINDINGS: Implant in the right temporal bone. Streak artifact from the implant limits visualization of some areas. Ventricles and sulci appear symmetrical. No mass effect or midline shift. No abnormal  extra-axial fluid collections. Gray-white matter junctions are distinct. Basal cisterns are not effaced. No evidence of acute intracranial hemorrhage. No depressed skull fractures. Visualized paranasal sinuses and mastoid air cells are not opacified.  IMPRESSION: No acute intracranial abnormalities.  Right temporal implant.   Electronically Signed   By: Lucienne Capers M.D.   On: 06/25/2014 23:20     EKG Interpretation None      MDM   Final diagnoses:  Elevated lactic acid level  Postmenopausal vaginal bleeding  Antiplatelet or antithrombotic long-term use  Amnesia, global, transient    Filed Vitals:   06/25/14 1949 06/25/14 2056 06/25/14 2145 06/25/14 2200  BP:  176/65 186/83 192/81  Pulse:  78 99 98  Temp:      TempSrc:      Resp:  18 21 22   SpO2: 98% 95% 96% 96%    Medications  medroxyPROGESTERone (PROVERA) tablet 10 mg (10 mg Oral Given 06/25/14 2214)  metoprolol succinate (TOPROL-XL) 24 hr tablet 25 mg (not administered)  pantoprazole (PROTONIX) EC tablet 40 mg (not administered)  levothyroxine (SYNTHROID, LEVOTHROID) tablet 100 mcg (not administered)  atorvastatin (LIPITOR) tablet 80 mg (not administered)  0.9 %  sodium chloride infusion (not administered)  sodium chloride 0.9 % bolus 1,000 mL (0 mLs Intravenous Stopped 06/26/14 0030)    [name] is a pleasant 74 y.o. female presenting with Heavy vaginal bleeding onset several hours ago, passing large clots, feels lightheaded, patient is taking Plavix. Pelvic exam shows heavy bleeding, passage of quarter size clots. Hemodynamically stable. No anemia seen on CBC. I think that likely has not caught up to the blood loss.will discuss case with her OB/GYN's group. States she is anxious, patient offered benzodiazepine and she declines.   I was informed that patient is becoming confused, states that she does not remember calling the ambulance to come. She will members me when I walk in the room states she's not sure how  we've met. Oriented to person and place, she states that the year is 2014, nose that the president is Obama. Neuro exam is nonfocal. Will order head CT.  Lactate is mildly elevated at 2.54, likely  secondary to blood loss. Will bolus and reassess.  Gynecology consult from Dr. Gertie Fey appreciated: He states that she will need a D&C for both diagnostic and therapeutic reasons however this is nonemergent. Recommend starting Provera 10 mg twice a day for 5 days. She will be seen in their office on Monday. Recommend return precautions for changing pad every hourly.  This is a shared visit with the attending physician who personally evaluated the patient and agrees with the care plan.   Head CT is negative, had visited the patient at the bedside, she states that she remembers me but does not remember initially coming in to the ED. Her sister is with her now. The patient lives alone, sister expresses concerns about her being discharged home. Considering her rapid blood loss, elevated lactic acid and global amnesia she will need admission.  Neurology consult from Dr. Janann Colonel appreciated: He recommends MRI and EEG as an outpatient   Extensive discussion with triad hospitalist Dr. Alcario Drought, he will admit the patient for obs.   Monico Blitz, PA-C 06/26/14 0111

## 2014-06-25 NOTE — ED Notes (Signed)
Awake. Verbally responsive. A/O x4 with intermittent confusion.  Pt reported sudden onset of confusion to events. Resp even and unlabored. No audible adventitious breath sounds noted. ABC's intact. SR on monitor at 86bpm. Family at bedside. IV saline lock patent and intact.

## 2014-06-25 NOTE — ED Notes (Signed)
Bed: WA06 Expected date:  Expected time:  Means of arrival:  Comments: EMS 73yo F Vag Bleeding

## 2014-06-25 NOTE — ED Provider Notes (Signed)
Medical screening examination/treatment/procedure(s) were conducted as a shared visit with non-physician practitioner(s) and myself.  I personally evaluated the patient during the encounter.   EKG Interpretation None     Pt here with vaginal bleeding--some retrograde amnesia that's clearing without stroke symptoms. Will check head CT. Have counseled to gynecology and follow-up arranged  Lacretia Leigh, MD 06/25/14 2319

## 2014-06-26 ENCOUNTER — Encounter (HOSPITAL_COMMUNITY): Payer: Self-pay | Admitting: Anesthesiology

## 2014-06-26 ENCOUNTER — Encounter (HOSPITAL_COMMUNITY): Payer: Self-pay

## 2014-06-26 ENCOUNTER — Encounter (HOSPITAL_COMMUNITY)
Admission: EM | Disposition: A | Discharge status: Home / Self Care | Payer: Self-pay | Source: Home / Self Care | Attending: Internal Medicine

## 2014-06-26 ENCOUNTER — Inpatient Hospital Stay (HOSPITAL_COMMUNITY): Payer: Medicare Other

## 2014-06-26 DIAGNOSIS — E039 Hypothyroidism, unspecified: Secondary | ICD-10-CM | POA: Diagnosis present

## 2014-06-26 DIAGNOSIS — I252 Old myocardial infarction: Secondary | ICD-10-CM | POA: Diagnosis not present

## 2014-06-26 DIAGNOSIS — E669 Obesity, unspecified: Secondary | ICD-10-CM | POA: Diagnosis present

## 2014-06-26 DIAGNOSIS — N858 Other specified noninflammatory disorders of uterus: Secondary | ICD-10-CM | POA: Diagnosis not present

## 2014-06-26 DIAGNOSIS — H9190 Unspecified hearing loss, unspecified ear: Secondary | ICD-10-CM | POA: Diagnosis present

## 2014-06-26 DIAGNOSIS — R412 Retrograde amnesia: Secondary | ICD-10-CM | POA: Diagnosis present

## 2014-06-26 DIAGNOSIS — Z9049 Acquired absence of other specified parts of digestive tract: Secondary | ICD-10-CM | POA: Diagnosis present

## 2014-06-26 DIAGNOSIS — D259 Leiomyoma of uterus, unspecified: Secondary | ICD-10-CM | POA: Diagnosis not present

## 2014-06-26 DIAGNOSIS — Z7902 Long term (current) use of antithrombotics/antiplatelets: Secondary | ICD-10-CM | POA: Diagnosis not present

## 2014-06-26 DIAGNOSIS — N939 Abnormal uterine and vaginal bleeding, unspecified: Secondary | ICD-10-CM | POA: Diagnosis not present

## 2014-06-26 DIAGNOSIS — Z7982 Long term (current) use of aspirin: Secondary | ICD-10-CM | POA: Diagnosis not present

## 2014-06-26 DIAGNOSIS — R55 Syncope and collapse: Secondary | ICD-10-CM | POA: Diagnosis not present

## 2014-06-26 DIAGNOSIS — Z6832 Body mass index (BMI) 32.0-32.9, adult: Secondary | ICD-10-CM | POA: Diagnosis not present

## 2014-06-26 DIAGNOSIS — E785 Hyperlipidemia, unspecified: Secondary | ICD-10-CM | POA: Diagnosis present

## 2014-06-26 DIAGNOSIS — D62 Acute posthemorrhagic anemia: Secondary | ICD-10-CM | POA: Diagnosis not present

## 2014-06-26 DIAGNOSIS — Z955 Presence of coronary angioplasty implant and graft: Secondary | ICD-10-CM | POA: Diagnosis not present

## 2014-06-26 DIAGNOSIS — D696 Thrombocytopenia, unspecified: Secondary | ICD-10-CM | POA: Diagnosis present

## 2014-06-26 DIAGNOSIS — Z91041 Radiographic dye allergy status: Secondary | ICD-10-CM | POA: Diagnosis not present

## 2014-06-26 DIAGNOSIS — Z91030 Bee allergy status: Secondary | ICD-10-CM | POA: Diagnosis not present

## 2014-06-26 DIAGNOSIS — I10 Essential (primary) hypertension: Secondary | ICD-10-CM | POA: Diagnosis present

## 2014-06-26 DIAGNOSIS — G454 Transient global amnesia: Secondary | ICD-10-CM | POA: Diagnosis not present

## 2014-06-26 DIAGNOSIS — N8329 Other ovarian cysts: Secondary | ICD-10-CM | POA: Diagnosis not present

## 2014-06-26 DIAGNOSIS — I951 Orthostatic hypotension: Secondary | ICD-10-CM | POA: Diagnosis not present

## 2014-06-26 DIAGNOSIS — N950 Postmenopausal bleeding: Secondary | ICD-10-CM | POA: Diagnosis present

## 2014-06-26 DIAGNOSIS — Z87891 Personal history of nicotine dependence: Secondary | ICD-10-CM | POA: Diagnosis not present

## 2014-06-26 DIAGNOSIS — K219 Gastro-esophageal reflux disease without esophagitis: Secondary | ICD-10-CM | POA: Diagnosis present

## 2014-06-26 DIAGNOSIS — I2510 Atherosclerotic heart disease of native coronary artery without angina pectoris: Secondary | ICD-10-CM | POA: Diagnosis present

## 2014-06-26 HISTORY — DX: Syncope and collapse: R55

## 2014-06-26 HISTORY — DX: Retrograde amnesia: R41.2

## 2014-06-26 LAB — CBC WITH DIFFERENTIAL/PLATELET
Basophils Absolute: 0 10*3/uL (ref 0.0–0.1)
Basophils Relative: 0 % (ref 0–1)
EOS PCT: 1 % (ref 0–5)
Eosinophils Absolute: 0.1 10*3/uL (ref 0.0–0.7)
HEMATOCRIT: 38.6 % (ref 36.0–46.0)
Hemoglobin: 12.8 g/dL (ref 12.0–15.0)
LYMPHS ABS: 1.1 10*3/uL (ref 0.7–4.0)
LYMPHS PCT: 11 % — AB (ref 12–46)
MCH: 29.4 pg (ref 26.0–34.0)
MCHC: 33.2 g/dL (ref 30.0–36.0)
MCV: 88.7 fL (ref 78.0–100.0)
MONOS PCT: 5 % (ref 3–12)
Monocytes Absolute: 0.5 10*3/uL (ref 0.1–1.0)
Neutro Abs: 8.6 10*3/uL — ABNORMAL HIGH (ref 1.7–7.7)
Neutrophils Relative %: 83 % — ABNORMAL HIGH (ref 43–77)
Platelets: 169 10*3/uL (ref 150–400)
RBC: 4.35 MIL/uL (ref 3.87–5.11)
RDW: 12.9 % (ref 11.5–15.5)
WBC: 10.3 10*3/uL (ref 4.0–10.5)

## 2014-06-26 LAB — CBC
HCT: 32.9 % — ABNORMAL LOW (ref 36.0–46.0)
HCT: 30.9 % — ABNORMAL LOW (ref 36.0–46.0)
HCT: 29.1 % — ABNORMAL LOW (ref 36.0–46.0)
HEMOGLOBIN: 10.3 g/dL — AB (ref 12.0–15.0)
HEMOGLOBIN: 9.6 g/dL — AB (ref 12.0–15.0)
Hemoglobin: 10.5 g/dL — ABNORMAL LOW (ref 12.0–15.0)
MCH: 28.6 pg (ref 26.0–34.0)
MCH: 29.7 pg (ref 26.0–34.0)
MCH: 29.9 pg (ref 26.0–34.0)
MCHC: 31.9 g/dL (ref 30.0–36.0)
MCHC: 33.3 g/dL (ref 30.0–36.0)
MCHC: 33 g/dL (ref 30.0–36.0)
MCV: 89.6 fL (ref 78.0–100.0)
MCV: 89 fL (ref 78.0–100.0)
MCV: 90.7 fL (ref 78.0–100.0)
PLATELETS: 159 10*3/uL (ref 150–400)
Platelets: 143 10*3/uL — ABNORMAL LOW (ref 150–400)
Platelets: 156 10*3/uL (ref 150–400)
RBC: 3.67 MIL/uL — ABNORMAL LOW (ref 3.87–5.11)
RBC: 3.47 MIL/uL — ABNORMAL LOW (ref 3.87–5.11)
RBC: 3.21 MIL/uL — ABNORMAL LOW (ref 3.87–5.11)
RDW: 13 % (ref 11.5–15.5)
RDW: 13.2 % (ref 11.5–15.5)
RDW: 13.3 % (ref 11.5–15.5)
WBC: 7.2 10*3/uL (ref 4.0–10.5)
WBC: 5.9 10*3/uL (ref 4.0–10.5)
WBC: 7.6 10*3/uL (ref 4.0–10.5)

## 2014-06-26 LAB — ABO/RH: ABO/RH(D): A POS

## 2014-06-26 LAB — I-STAT CG4 LACTIC ACID, ED: LACTIC ACID, VENOUS: 0.97 mmol/L (ref 0.5–2.0)

## 2014-06-26 LAB — GLUCOSE, CAPILLARY: Glucose-Capillary: 104 mg/dL — ABNORMAL HIGH (ref 70–99)

## 2014-06-26 SURGERY — DILATATION & CURETTAGE/HYSTEROSCOPY WITH MYOSURE
Anesthesia: General

## 2014-06-26 MED ORDER — ACETAMINOPHEN 325 MG PO TABS: 650.0000 mg | ORAL_TABLET | Freq: Four times a day (QID) | ORAL | Status: DC | PRN

## 2014-06-26 MED ORDER — SODIUM CHLORIDE 0.9 % IV SOLN
INTRAVENOUS | Status: DC
  Administered 2014-06-26 (×2): via INTRAVENOUS

## 2014-06-26 MED ORDER — METOPROLOL SUCCINATE ER 25 MG PO TB24
25.0000 mg | ORAL_TABLET | Freq: Every day | ORAL | Status: DC
  Administered 2014-06-26 – 2014-06-30 (×5): 25 mg via ORAL
  Filled 2014-06-26 (×5): qty 1

## 2014-06-26 MED ORDER — PROPOFOL 10 MG/ML IV BOLUS
INTRAVENOUS | Status: AC
  Filled 2014-06-26: qty 20

## 2014-06-26 MED ORDER — MEDROXYPROGESTERONE ACETATE 10 MG PO TABS: 10.0000 mg | ORAL_TABLET | Freq: Two times a day (BID) | ORAL | Status: DC

## 2014-06-26 MED ORDER — PANTOPRAZOLE SODIUM 40 MG PO TBEC
40.0000 mg | DELAYED_RELEASE_TABLET | Freq: Every day | ORAL | Status: DC
  Administered 2014-06-26 – 2014-06-30 (×5): 40 mg via ORAL
  Filled 2014-06-26 (×5): qty 1

## 2014-06-26 MED ORDER — SODIUM CHLORIDE 0.9 % IV SOLN
INTRAVENOUS | Status: AC
  Administered 2014-06-27: 08:00:00 via INTRAVENOUS

## 2014-06-26 MED ORDER — ATORVASTATIN CALCIUM 80 MG PO TABS
80.0000 mg | ORAL_TABLET | Freq: Every day | ORAL | Status: DC
  Administered 2014-06-26 – 2014-06-27 (×2): 80 mg via ORAL
  Administered 2014-06-28: 40 mg via ORAL
  Administered 2014-06-29: 80 mg via ORAL
  Filled 2014-06-26 (×5): qty 1

## 2014-06-26 MED ORDER — SODIUM CHLORIDE 0.9 % IV SOLN
Freq: Once | INTRAVENOUS | Status: DC
Start: 2014-06-26 — End: 2014-06-28

## 2014-06-26 MED ORDER — LEVOTHYROXINE SODIUM 100 MCG PO TABS
100.0000 ug | ORAL_TABLET | Freq: Every day | ORAL | Status: DC
  Administered 2014-06-26 – 2014-06-30 (×5): 100 ug via ORAL
  Filled 2014-06-26 (×6): qty 1

## 2014-06-26 MED ORDER — MORPHINE SULFATE 2 MG/ML IJ SOLN: 1.0000 mg | Freq: Once | INTRAMUSCULAR | Status: DC

## 2014-06-26 MED ORDER — MEDROXYPROGESTERONE ACETATE 10 MG PO TABS
10.0000 mg | ORAL_TABLET | Freq: Two times a day (BID) | ORAL | Status: DC
  Administered 2014-06-26 – 2014-06-30 (×8): 10 mg via ORAL
  Filled 2014-06-26 (×11): qty 1

## 2014-06-26 MED ORDER — SODIUM CHLORIDE 0.9 % IJ SOLN
3.0000 mL | Freq: Two times a day (BID) | INTRAMUSCULAR | Status: DC
  Administered 2014-06-29 – 2014-06-30 (×3): 3 mL via INTRAVENOUS

## 2014-06-26 MED ORDER — ONDANSETRON HCL 4 MG/2ML IJ SOLN
4.0000 mg | Freq: Four times a day (QID) | INTRAMUSCULAR | Status: DC | PRN
  Administered 2014-06-26: 4 mg via INTRAVENOUS
  Filled 2014-06-26: qty 2

## 2014-06-26 MED ORDER — FLEET ENEMA 7-19 GM/118ML RE ENEM
1.0000 | ENEMA | Freq: Once | RECTAL | Status: DC
  Filled 2014-06-26: qty 1

## 2014-06-26 MED ORDER — SODIUM CHLORIDE 0.9 % IV BOLUS (SEPSIS)
250.0000 mL | Freq: Once | INTRAVENOUS | Status: AC
  Administered 2014-06-26: 250 mL via INTRAVENOUS

## 2014-06-26 MED ORDER — FENTANYL CITRATE 0.05 MG/ML IJ SOLN
INTRAMUSCULAR | Status: AC
  Filled 2014-06-26: qty 5

## 2014-06-26 NOTE — Anesthesia Preprocedure Evaluation (Deleted)
Anesthesia Evaluation  Patient identified by MRN, date of birth, ID band Patient awake    Reviewed: Allergy & Precautions, H&P , NPO status , Patient's Chart, lab work & pertinent test results, reviewed documented beta blocker date and time   Airway        Dental no notable dental hx.    Pulmonary neg pulmonary ROS, former smoker,    Pulmonary exam normal       Cardiovascular hypertension, Pt. on medications and Pt. on home beta blockers + CAD and + Past MI     Neuro/Psych negative neurological ROS  negative psych ROS   GI/Hepatic Neg liver ROS, GERD-  Medicated and Controlled,  Endo/Other  Hypothyroidism   Renal/GU negative Renal ROS  negative genitourinary   Musculoskeletal   Abdominal   Peds  Hematology negative hematology ROS (+)   Anesthesia Other Findings   Reproductive/Obstetrics negative OB ROS                             Anesthesia Physical Anesthesia Plan  ASA: III  Anesthesia Plan: General   Post-op Pain Management:    Induction: Intravenous  Airway Management Planned: LMA and Oral ETT  Additional Equipment:   Intra-op Plan:   Post-operative Plan: Extubation in OR  Informed Consent: I have reviewed the patients History and Physical, chart, labs and discussed the procedure including the risks, benefits and alternatives for the proposed anesthesia with the patient or authorized representative who has indicated his/her understanding and acceptance.   Dental advisory given  Plan Discussed with: CRNA  Anesthesia Plan Comments:         Anesthesia Quick Evaluation

## 2014-06-26 NOTE — ED Notes (Signed)
MD is in the room speaking with patient.  I will collect labs when he finish.

## 2014-06-26 NOTE — Progress Notes (Signed)
UR completed 

## 2014-06-26 NOTE — ED Notes (Signed)
Awake. Verbally responsive. A/O x3 with confusion to events. Resp even and unlabored. No audible adventitious breath sounds noted. ABC's intact. SR on monitor. Family at bedside.

## 2014-06-26 NOTE — Progress Notes (Signed)
Reason for Consult:post menopausal bleeding Referring Physician: Hiilani Jetter is an 74 y.o. female patient of Dr Matthew Saras. In 2010 she had H/S D&C for a large endocervical polyp-benign. Again in 2012 she had H/S D&C for PMB with benign endometrial polyp on pathology and a notation of a submucous fibroid. Last AEX with Dr Matthew Saras 06/07/14 noted no bleeding. On exam her uterus was normal size. Patient reports a couple "minor" episodes of vaginal spotting since 2012. Yesterday had onset of heavy vaginal bleeding while at home. She lives alone and cannot remember calling 911 or the next 5 hours or so. Today has continued vaginal bleeding. Mild cramping. While up to BR with assistance, she had a syncopal episode about 30 minutes ago. Feeling better now in bed. IV fluids running-NPO with no food today.  Pertinent Gynecological History: Menses: post-menopausal Bleeding: post menopausal bleeding Contraception: none DES exposure: denies Blood transfusions: none Sexually transmitted diseases: no past history Previous GYN Procedures: H/S D&C 2010 large endocervical polyp, H/S D&C 2012 submucous fibroid noted  Last mammogram: normal Date: 2016 Last pap: normal Date: 2016 OB History: G0, P0   Menstrual History: Menarche age: unknown  No LMP recorded. Patient is postmenopausal.    Past Medical History  Diagnosis Date  . IV Contrast Allergy   . GERD (gastroesophageal reflux disease)   . Hypothyroidism   . Rosacea   . Hearing loss     a. s/p cochlear implant on right, hearing aid on left.  . Obesity   . Fibroids     a. uterine - spotting noted since 03/11/2013.  Marland Kitchen CAD (coronary artery disease)     a. 03/2013 Infpost STEMI/PCI: LM nl, LAD min irregs, LCX 100 (3.0x12 Vision BMS), RCA  mild ostial dzs, EF 55%.  . Essential hypertension, benign   . Ventricular tachycardia     a. Immediate post pci/MI - no complications, on BB.  Marland Kitchen Hyperlipidemia   . Myocardial infarction     Past  Surgical History  Procedure Laterality Date  . Cochlear implant    . Upper gastrointestinal endoscopy    . Cervical polypectomy      2010  . Cholecystectomy    . Uterine polyp      had D and C done to remove the polyp  . Left heart catheterization with coronary angiogram N/A 03/25/2013    Procedure: LEFT HEART CATHETERIZATION WITH CORONARY ANGIOGRAM;  Surgeon: Burnell Blanks, MD;  Location: [facility] CATH LAB;  Service: Cardiovascular;  Laterality: N/A;    Family History  Problem Relation Age of Onset  . Esophageal cancer Neg Hx   . Stomach cancer Neg Hx   . Colon cancer Cousin   . Heart failure Mother     died in her 16's.  . Cancer Mother   . Diabetes Mother   . Heart disease Mother   . Hypertension Mother   . Other Father     died in WWII  . Hyperlipidemia Sister   . Diabetes Sister   . Heart attack Mother   . Heart attack Maternal Grandmother   . Stroke Neg Hx     Social History:  reports that she quit smoking about 46 years ago. Her smoking use included Cigarettes. She has a .4 pack-year smoking history. She does not have any smokeless tobacco history on file. She reports that she does not drink alcohol or use illicit drugs.  Allergies:  Allergies  Allergen Reactions  . Bee Venom Itching and Swelling  .  Iohexol Hives    Medications: I have reviewed the patient's current medications.  ROS  Blood pressure 144/52, pulse 59, temperature 97.9 F (36.6 C), temperature source Oral, resp. rate 18, height _0  (1.676 m), weight 204 lb 2.3 oz (92.6 kg), SpO2 100 %. Physical Exam  Results for orders placed or performed during the hospital encounter of 06/25/14 (from the past 48 hour(s))  Wet prep, genital     Status: Abnormal   Collection Time: 06/25/14  8:24 PM  Result Value Ref Range   Yeast Wet Prep HPF POC NONE SEEN NONE SEEN   Trich, Wet Prep NONE SEEN NONE SEEN   Clue Cells Wet Prep HPF POC NONE SEEN NONE SEEN   WBC, Wet Prep HPF POC RARE (A) NONE SEEN  CBC  with Differential     Status: None   Collection Time: 06/25/14  8:30 PM  Result Value Ref Range   WBC 9.6 4.0 - 10.5 K/uL   RBC 4.62 3.87 - 5.11 MIL/uL   Hemoglobin 13.8 12.0 - 15.0 g/dL   HCT 41.8 36.0 - 46.0 %   MCV 90.5 78.0 - 100.0 fL   MCH 29.9 26.0 - 34.0 pg   MCHC 33.0 30.0 - 36.0 g/dL   RDW 13.0 11.5 - 15.5 %   Platelets 189 150 - 400 K/uL   Neutrophils Relative % 75 43 - 77 %   Neutro Abs 7.1 1.7 - 7.7 K/uL   Lymphocytes Relative 13 12 - 46 %   Lymphs Abs 1.3 0.7 - 4.0 K/uL   Monocytes Relative 7 3 - 12 %   Monocytes Absolute 0.7 0.1 - 1.0 K/uL   Eosinophils Relative 5 0 - 5 %   Eosinophils Absolute 0.5 0.0 - 0.7 K/uL   Basophils Relative 0 0 - 1 %   Basophils Absolute 0.0 0.0 - 0.1 K/uL  Comprehensive metabolic panel     Status: Abnormal   Collection Time: 06/25/14  8:30 PM  Result Value Ref Range   Sodium 136 135 - 145 mmol/L   Potassium 3.6 3.5 - 5.1 mmol/L   Chloride 101 96 - 112 mmol/L   CO2 25 19 - 32 mmol/L   Glucose, Bld 125 (H) 70 - 99 mg/dL   BUN 19 6 - 23 mg/dL   Creatinine, Ser 0.81 0.50 - 1.10 mg/dL   Calcium 9.2 8.4 - 10.5 mg/dL   Total Protein 6.8 6.0 - 8.3 g/dL   Albumin 4.0 3.5 - 5.2 g/dL   AST 26 0 - 37 U/L   ALT 17 0 - 35 U/L   Alkaline Phosphatase 67 39 - 117 U/L   Total Bilirubin 0.4 0.3 - 1.2 mg/dL   GFR calc non Af Amer 71 (L) >90 mL/min   GFR calc Af Amer 83 (L) >90 mL/min    Comment: (NOTE) The eGFR has been calculated using the CKD EPI equation. This calculation has not been validated in all clinical situations. eGFR's persistently <90 mL/min signify possible Chronic Kidney Disease.    Anion gap 10 5 - 15  Protime-INR     Status: None   Collection Time: 06/25/14  8:30 PM  Result Value Ref Range   Prothrombin Time 14.4 11.6 - 15.2 seconds   INR 1.10 0.00 - 1.49  APTT     Status: None   Collection Time: 06/25/14  8:30 PM  Result Value Ref Range   aPTT 29 24 - 37 seconds  Sample to Blood Bank  Status: None   Collection Time:  06/25/14  8:30 PM  Result Value Ref Range   Blood Bank Specimen SAMPLE AVAILABLE FOR TESTING    Sample Expiration 06/28/2014   I-Stat CG4 Lactic Acid, ED     Status: Abnormal   Collection Time: 06/25/14  9:51 PM  Result Value Ref Range   Lactic Acid, Venous 2.54 (HH) 0.5 - 2.0 mmol/L   Comment NOTIFIED PHYSICIAN   CBC with Differential     Status: Abnormal   Collection Time: 06/26/14 12:56 AM  Result Value Ref Range   WBC 10.3 4.0 - 10.5 K/uL   RBC 4.35 3.87 - 5.11 MIL/uL   Hemoglobin 12.8 12.0 - 15.0 g/dL   HCT 38.6 36.0 - 46.0 %   MCV 88.7 78.0 - 100.0 fL   MCH 29.4 26.0 - 34.0 pg   MCHC 33.2 30.0 - 36.0 g/dL   RDW 12.9 11.5 - 15.5 %   Platelets 169 150 - 400 K/uL   Neutrophils Relative % 83 (H) 43 - 77 %   Neutro Abs 8.6 (H) 1.7 - 7.7 K/uL   Lymphocytes Relative 11 (L) 12 - 46 %   Lymphs Abs 1.1 0.7 - 4.0 K/uL   Monocytes Relative 5 3 - 12 %   Monocytes Absolute 0.5 0.1 - 1.0 K/uL   Eosinophils Relative 1 0 - 5 %   Eosinophils Absolute 0.1 0.0 - 0.7 K/uL   Basophils Relative 0 0 - 1 %   Basophils Absolute 0.0 0.0 - 0.1 K/uL  I-Stat CG4 Lactic Acid, ED     Status: None   Collection Time: 06/26/14  1:13 AM  Result Value Ref Range   Lactic Acid, Venous 0.97 0.5 - 2.0 mmol/L  CBC     Status: Abnormal   Collection Time: 06/26/14  7:44 AM  Result Value Ref Range   WBC 7.2 4.0 - 10.5 K/uL   RBC 3.67 (L) 3.87 - 5.11 MIL/uL   Hemoglobin 10.5 (L) 12.0 - 15.0 g/dL    Comment: RESULT REPEATED AND VERIFIED DELTA CHECK NOTED    HCT 32.9 (L) 36.0 - 46.0 %   MCV 89.6 78.0 - 100.0 fL   MCH 28.6 26.0 - 34.0 pg   MCHC 31.9 30.0 - 36.0 g/dL   RDW 13.0 11.5 - 15.5 %   Platelets 143 (L) 150 - 400 K/uL    Dg Chest 2 View  06/26/2014   CLINICAL DATA:  Elevated lactic acid.  Confusion.  EXAM: CHEST  2 VIEW  COMPARISON:  04/08/2013  FINDINGS: Normal heart size and pulmonary vascularity. No focal airspace disease or consolidation in the lungs. No blunting of costophrenic angles. No  pneumothorax. Mediastinal contours appear intact. Degenerative changes in the spine.  IMPRESSION: No active cardiopulmonary disease.   Electronically Signed   By: Lucienne Capers M.D.   On: 06/26/2014 00:06   Ct Head Wo Contrast  06/25/2014   CLINICAL DATA:  Dizziness and confusion. Short-term memory loss. Patient is deaf with implant.  EXAM: CT HEAD WITHOUT CONTRAST  TECHNIQUE: Contiguous axial images were obtained from the base of the skull through the vertex without intravenous contrast.  COMPARISON:  CT temporal bones 12/22/2007  FINDINGS: Implant in the right temporal bone. Streak artifact from the implant limits visualization of some areas. Ventricles and sulci appear symmetrical. No mass effect or midline shift. No abnormal extra-axial fluid collections. Gray-white matter junctions are distinct. Basal cisterns are not effaced. No evidence of acute intracranial hemorrhage. No depressed skull  fractures. Visualized paranasal sinuses and mastoid air cells are not opacified.  IMPRESSION: No acute intracranial abnormalities.  Right temporal implant.   Electronically Signed   By: Lucienne Capers M.D.   On: 06/25/2014 23:20    Assessment/Plan: 74 yo G0P0 with recurrent heavy PMB with syncope, falling Hgb.  History of endometrial and endocervical polyps and submucous fibroid D/W patient and her sister above-possible recurrence of polyp, either benign/precancerous/cancerous findings and possible contribution of submucous fibroid. Will obtain pelvic U/S, check repeat CBC and cross for 2 units PRBCs D/W patient possible transfusion of PRBC with risks including HIV/Hep/transfusion reaction. D/W probable H/S, D&C with Myosure  Mena Lienau II,Demeco Ducksworth E 06/26/2014

## 2014-06-26 NOTE — ED Notes (Signed)
Awake. Verbally responsive. A/O x3 with confusion to events. Resp even and unlabored. No audible adventitious breath sounds noted. ABC's intact. SR on monitor. Pt ambulated to BR and noted passing lg clots. Pt denies weakness/dizziness.Family at bedside.

## 2014-06-26 NOTE — Progress Notes (Signed)
Called  Into the room by NT [facility]  To find a large blood clot on the bathroom floor with pt Sitting up on the commode. Pt verbalized she has no pain, states she had being passing clots that large since few hours ago, denies any dizziness, shortness of breath,  Or weakness.

## 2014-06-26 NOTE — H&P (Signed)
[facility] History and Physical  CARLYNE KEEHAN [id number]:[id number] DOB: May 22, 1940 DOA: 06/25/2014  Referring physician: EDP PCP: [name], MD   Chief Complaint: Vaginal bleeding   HPI: Kimberly YOUNAN is a 74 y.o. female who presents to the ED with onset of heavy vaginal bleeding and passage of large and many clots since 4pm.  Some lightheadedness with this, no chest pain, no syncope, no palpitations.  She does have some retrograde amnesia of calling EMS, coming to the ED, and her evaluation in the ED according to her and the PA who saw the patient.  Review of Systems: Systems reviewed.  As above, otherwise negative  Past Medical History  Diagnosis Date  . IV Contrast Allergy   . GERD (gastroesophageal reflux disease)   . Hypothyroidism   . Rosacea   . Hearing loss     a. s/p cochlear implant on right, hearing aid on left.  . Obesity   . Fibroids     a. uterine - spotting noted since 03/11/2013.  Marland Kitchen CAD (coronary artery disease)     a. 03/2013 Infpost STEMI/PCI: LM nl, LAD min irregs, LCX 100 (3.0x12 Vision BMS), RCA  mild ostial dzs, EF 55%.  . Essential hypertension, benign   . Ventricular tachycardia     a. Immediate post pci/MI - no complications, on BB.  Marland Kitchen Hyperlipidemia   . Myocardial infarction    Past Surgical History  Procedure Laterality Date  . Cochlear implant    . Upper gastrointestinal endoscopy    . Cervical polypectomy      2010  . Cholecystectomy    . Uterine polyp      had D and C done to remove the polyp  . Left heart catheterization with coronary angiogram N/A 03/25/2013    Procedure: LEFT HEART CATHETERIZATION WITH CORONARY ANGIOGRAM;  Surgeon: Burnell Blanks, MD;  Location: [facility] CATH LAB;  Service: Cardiovascular;  Laterality: N/A;   Social History:  reports that she quit smoking about 46 years ago. Her smoking use included Cigarettes. She has a .4 pack-year smoking history. She does not have any smokeless tobacco history on file.  She reports that she does not drink alcohol or use illicit drugs.  Allergies  Allergen Reactions  . Bee Venom Itching and Swelling  . Iohexol Hives    Family History  Problem Relation Age of Onset  . Esophageal cancer Neg Hx   . Stomach cancer Neg Hx   . Colon cancer Cousin   . Heart failure Mother     died in her 29's.  . Cancer Mother   . Diabetes Mother   . Heart disease Mother   . Hypertension Mother   . Other Father     died in WWII  . Hyperlipidemia Sister   . Diabetes Sister   . Heart attack Mother   . Heart attack Maternal Grandmother   . Stroke Neg Hx      Prior to Admission medications   Medication Sig Start Date End Date Taking? Authorizing Provider  aspirin 81 MG tablet Take 81 mg by mouth daily.     Yes Historical Provider, MD  atorvastatin (LIPITOR) 80 MG tablet TAKE 1 TABLET IN THE EVENING AT 6PM. 03/22/14  Yes Jettie Booze, MD  Calcium Carb-Cholecalciferol (CALCIUM + D3) 600-200 MG-UNIT TABS Take 1 tablet by mouth daily.   Yes Historical Provider, MD  clopidogrel (PLAVIX) 75 MG tablet Take 1 tablet (75 mg total) by mouth daily. 09/09/13  Yes Conception Oms  Hassell Done, MD  Diclofenac Sodium (VOLTAREN EX) Apply 1 application topically 2 (two) times daily. Apply to both knees.   Yes Historical Provider, MD  FUROSEMIDE PO Take 20 mg by mouth daily as needed (weight pain). unknown   Yes Historical Provider, MD  levothyroxine (SYNTHROID, LEVOTHROID) 100 MCG tablet Take 100 mcg by mouth daily before breakfast.   Yes Historical Provider, MD  metoprolol succinate (TOPROL-XL) 25 MG 24 hr tablet TAKE 1 TABLET DAILY. 05/23/14  Yes Burnell Blanks, MD  metroNIDAZOLE (METROGEL) 0.75 % gel Apply 1 application topically daily. Patient places on her cheeks and nose, only about once or twice a week depending on the climate.   Yes Historical Provider, MD  omega-3 acid ethyl esters (LOVAZA) 1 G capsule Take 1 g by mouth daily.   Yes Historical Provider, MD  pantoprazole  (PROTONIX) 40 MG tablet Take 1 tablet (40 mg total) by mouth daily. 09/09/13  Yes Jettie Booze, MD  potassium chloride SA (K-DUR,KLOR-CON) 20 MEQ tablet Take 20 mEq by mouth daily as needed (potassium).  02/24/14  Yes Historical Provider, MD  medroxyPROGESTERone (PROVERA) 10 MG tablet Take 1 tablet (10 mg total) by mouth 2 (two) times daily. 06/25/14   Nicole Pisciotta, PA-C  nitroGLYCERIN (NITROSTAT) 0.4 MG SL tablet Place 1 tablet (0.4 mg total) under the tongue every 5 (five) minutes x 3 doses as needed for chest pain. 03/22/14   Jettie Booze, MD   Physical Exam: Filed Vitals:   06/26/14 0012  BP: 156/72  Pulse: 74  Temp:   Resp: 13    BP 156/72 mmHg  Pulse 74  Temp(Src) 97.8 F (36.6 C) (Oral)  Resp 13  SpO2 94%  General Appearance:    Alert, oriented, no distress, appears stated age  Head:    Normocephalic, atraumatic  Eyes:    PERRL, EOMI, sclera non-icteric        Nose:   Nares without drainage or epistaxis. Mucosa, turbinates normal  Throat:   Moist mucous membranes. Oropharynx without erythema or exudate.  Neck:   Supple. No carotid bruits.  No thyromegaly.  No lymphadenopathy.   Back:     No CVA tenderness, no spinal tenderness  Lungs:     Clear to auscultation bilaterally, without wheezes, rhonchi or rales  Chest wall:    No tenderness to palpitation  Heart:    Regular rate and rhythm without murmurs, gallops, rubs  Abdomen:     Soft, non-tender, nondistended, normal bowel sounds, no organomegaly  Genitalia:    deferred  Rectal:    deferred  Extremities:   No clubbing, cyanosis or edema.  Pulses:   2+ and symmetric all extremities  Skin:   Skin color, texture, turgor normal, no rashes or lesions  Lymph nodes:   Cervical, supraclavicular, and axillary nodes normal  Neurologic:   CNII-XII intact. Normal strength, sensation and reflexes      throughout    Labs on Admission:  Basic Metabolic Panel:  Recent Labs Lab 06/25/14 2030  NA 136  K 3.6  CL  101  CO2 25  GLUCOSE 125*  BUN 19  CREATININE 0.81  CALCIUM 9.2   Liver Function Tests:  Recent Labs Lab 06/25/14 2030  AST 26  ALT 17  ALKPHOS 67  BILITOT 0.4  PROT 6.8  ALBUMIN 4.0   No results for input(s): LIPASE, AMYLASE in the last 168 hours. No results for input(s): AMMONIA in the last 168 hours. CBC:  Recent Labs Lab  06/25/14 2030  WBC 9.6  NEUTROABS 7.1  HGB 13.8  HCT 41.8  MCV 90.5  PLT 189   Cardiac Enzymes: No results for input(s): CKTOTAL, CKMB, CKMBINDEX, TROPONINI in the last 168 hours.  BNP (last 3 results) No results for input(s): PROBNP in the last 8760 hours. CBG: No results for input(s): GLUCAP in the last 168 hours.  Radiological Exams on Admission: Dg Chest 2 View  06/26/2014   CLINICAL DATA:  Elevated lactic acid.  Confusion.  EXAM: CHEST  2 VIEW  COMPARISON:  04/08/2013  FINDINGS: Normal heart size and pulmonary vascularity. No focal airspace disease or consolidation in the lungs. No blunting of costophrenic angles. No pneumothorax. Mediastinal contours appear intact. Degenerative changes in the spine.  IMPRESSION: No active cardiopulmonary disease.   Electronically Signed   By: Lucienne Capers M.D.   On: 06/26/2014 00:06   Ct Head Wo Contrast  06/25/2014   CLINICAL DATA:  Dizziness and confusion. Short-term memory loss. Patient is deaf with implant.  EXAM: CT HEAD WITHOUT CONTRAST  TECHNIQUE: Contiguous axial images were obtained from the base of the skull through the vertex without intravenous contrast.  COMPARISON:  CT temporal bones 12/22/2007  FINDINGS: Implant in the right temporal bone. Streak artifact from the implant limits visualization of some areas. Ventricles and sulci appear symmetrical. No mass effect or midline shift. No abnormal extra-axial fluid collections. Gray-white matter junctions are distinct. Basal cisterns are not effaced. No evidence of acute intracranial hemorrhage. No depressed skull fractures. Visualized paranasal  sinuses and mastoid air cells are not opacified.  IMPRESSION: No acute intracranial abnormalities.  Right temporal implant.   Electronically Signed   By: Lucienne Capers M.D.   On: 06/25/2014 23:20    EKG: Independently reviewed.  Assessment/Plan Principal Problem:   Vaginal bleeding Active Problems:   Retrograde amnesia   1. Vaginal bleeding - 1. Spoke with OB/GYN who is coming to evaluate patient tomorrow morning 2. Provera 10mg  BID ordered at his request 3. Patient likely will need at a minimum a D&C vs hysterectomy 4. Repeat CBC pending 5. Stopping ASA and plavix, patient is actively bleeding and is over 1 year out from her stent (Jan of 2015). 2. Retrograde amnesia - 1. Patient appears to have amnesia regarding calling EMS, coming to hospital, and dosent remember the PA examining her she states 2. Likely related to acute blood loss 3. No focal neurologic deficits at this time 4. Consider MRI / EEG as outpatient     Code Status: Full Code  Family Communication: Family is present and at bedside for the entire discussion and evaluation. Disposition Plan: Admit to obs   Time spent: 70 min  Poppen, Keven Osborn M. [facility] Pager [phone]  If 7AM-7PM, please contact the day team taking care of the patient [email or web address] Password [email or web address] 06/26/2014, 1:16 AM

## 2014-06-26 NOTE — Progress Notes (Addendum)
PROGRESS NOTE    [name] [id number] DOB: [birth year] DOA: 06/25/2014 PCP: [name], MD  GYN: Dr. Molli Posey Cardiology: Dr. Casandra Doffing. ENT: Dr. Benjamine Mola & Thornell Mule  HPI/Brief narrative 74 year old female patient with history of GERD, hypothyroid, hearing loss status post right cochlear implant and hearing aid, uterine fibroids with prior history of vaginal bleeding, CAD status post stent January 2015, essential hypertension and HLD admitted to the hospital on 06/26/14 with onset of heavy vaginal bleeding since 4 PM the evening prior. She denies abdominal pain. Off note, patient complains of amnesia to events leading from onset of vaginal bleeding to ED and remembers events since. Denies any other strokelike symptoms. GYN consulted.  Assessment/Plan:  Principal Problem:   Vaginal bleeding - GYN consultation by Dr. [name] appreciated. - As per recommendations, need to evaluate ovary and possible ascites - Follow-up abdominal and pelvic CT - NPO per Gyn - GYN ONC consult in a.m. - Started on medroxyprogesterone/Provera 10 MG twice a day - Aspirin and Plavix held (over 1 year out from her recent coronary stent in January 2015)  Active Problems:   Retrograde amnesia - Patient has amnesia regarding when vaginal bleed started, calling EMS, opening door for them, transport to the hospital, vaginal exam in the ED. - No focal deficits. - CT head without acute findings. - Unclear etiology.  - Discussed with Neuro Hospitalist 06/26/14: suggests Transient Global Amnesia. Get EEG and if normal no other work up.    Near syncope - Occurred this afternoon while ambulating to bathroom - Likely related to acute blood loss and orthostatic changes  - Bedrest  - check orthostatic blood pressures  -  IV fluids     Acute post-hemorrhagic anemia - Secondary to vaginal bleeding  - Monitor CBCs closely and transfuse if hemoglobin less than 8 g per DL  - Hemoglobin stable over 2  lab draws this morning.     GERD - Continue PPI     Essential hypertension, benign - Controlled. - Continue metoprolol    CAD (coronary artery disease) - Antiplatelets held secondary to significant vaginal bleeding  - continue metoprolol and statins    Hypothyroidism - Continue levothyroxine     Hyperlipidemia - Continue statins      Code Status: Full Family Communication: None at bedside Disposition Plan: DC home in 3-4 days when medically stable.   Consultants:  Gyn  Procedures:  None  Antibiotics:  None   Subjective: Seen this morning. Complained of ongoing vaginal bleed. Denied headache, slurred speech, facial asymmetry, asymmetric limb weakness or numbness. Complained of memory loss to events leading to hospitalization. No abdominal pain. Felt slightly dizzy on ambulating to the bathroom.  Objective: Filed Vitals:   06/26/14 0155 06/26/14 0216 06/26/14 0536 06/26/14 1159  BP: 151/70 151/62 131/51 144/52  Pulse: 82 79 64 59  Temp:  98.1 F (36.7 C) 98.2 F (36.8 C) 97.9 F (36.6 C)  TempSrc:  Oral Oral Oral  Resp: 18 18 18 18   Height:  5\' 6"  (1.676 m)    Weight:  92.6 kg (204 lb 2.3 oz)    SpO2: 94% 99% 99% 100%    Intake/Output Summary (Last 24 hours) at 06/26/14 1609 Last data filed at 06/26/14 0546  Gross per 24 hour  Intake      0 ml  Output      1 ml  Net     -1 ml   Filed Weights   06/26/14 0216  Weight: 92.6  kg (204 lb 2.3 oz)     Exam:  General exam: pleasant elderly female lying comfortably supine in bed.  Respiratory system: Clear. No increased work of breathing. Cardiovascular system: S1 & S2 heard, RRR. No JVD, murmurs, gallops, clicks or pedal edema. Telemetry: Sinus rhythm.  Gastrointestinal system: Abdomen is nondistended, soft and nontender. Normal bowel sounds heard. Central nervous system: Alert and oriented. No focal neurological deficits. Extremities: Symmetric 5 x 5 power.   Data Reviewed: Basic Metabolic  Panel:  Recent Labs Lab 06/25/14 2030  NA 136  K 3.6  CL 101  CO2 25  GLUCOSE 125*  BUN 19  CREATININE 0.81  CALCIUM 9.2   Liver Function Tests:  Recent Labs Lab 06/25/14 2030  AST 26  ALT 17  ALKPHOS 67  BILITOT 0.4  PROT 6.8  ALBUMIN 4.0   No results for input(s): LIPASE, AMYLASE in the last 168 hours. No results for input(s): AMMONIA in the last 168 hours. CBC:  Recent Labs Lab 06/25/14 2030 06/26/14 0056 06/26/14 0744 06/26/14 1237  WBC 9.6 10.3 7.2 5.9  NEUTROABS 7.1 8.6*  --   --   HGB 13.8 12.8 10.5* 10.3*  HCT 41.8 38.6 32.9* 30.9*  MCV 90.5 88.7 89.6 89.0  PLT 189 169 143* 156   Cardiac Enzymes: No results for input(s): CKTOTAL, CKMB, CKMBINDEX, TROPONINI in the last 168 hours. BNP (last 3 results) No results for input(s): PROBNP in the last 8760 hours. CBG: No results for input(s): GLUCAP in the last 168 hours.  Recent Results (from the past 240 hour(s))  Wet prep, genital     Status: Abnormal   Collection Time: 06/25/14  8:24 PM  Result Value Ref Range Status   Yeast Wet Prep HPF POC NONE SEEN NONE SEEN Final   Trich, Wet Prep NONE SEEN NONE SEEN Final   Clue Cells Wet Prep HPF POC NONE SEEN NONE SEEN Final   WBC, Wet Prep HPF POC RARE (A) NONE SEEN Final        Studies: Dg Chest 2 View  06/26/2014   CLINICAL DATA:  Elevated lactic acid.  Confusion.  EXAM: CHEST  2 VIEW  COMPARISON:  04/08/2013  FINDINGS: Normal heart size and pulmonary vascularity. No focal airspace disease or consolidation in the lungs. No blunting of costophrenic angles. No pneumothorax. Mediastinal contours appear intact. Degenerative changes in the spine.  IMPRESSION: No active cardiopulmonary disease.   Electronically Signed   By: Lucienne Capers M.D.   On: 06/26/2014 00:06   Ct Head Wo Contrast  06/25/2014   CLINICAL DATA:  Dizziness and confusion. Short-term memory loss. Patient is deaf with implant.  EXAM: CT HEAD WITHOUT CONTRAST  TECHNIQUE: Contiguous axial  images were obtained from the base of the skull through the vertex without intravenous contrast.  COMPARISON:  CT temporal bones 12/22/2007  FINDINGS: Implant in the right temporal bone. Streak artifact from the implant limits visualization of some areas. Ventricles and sulci appear symmetrical. No mass effect or midline shift. No abnormal extra-axial fluid collections. Gray-white matter junctions are distinct. Basal cisterns are not effaced. No evidence of acute intracranial hemorrhage. No depressed skull fractures. Visualized paranasal sinuses and mastoid air cells are not opacified.  IMPRESSION: No acute intracranial abnormalities.  Right temporal implant.   Electronically Signed   By: Lucienne Capers M.D.   On: 06/25/2014 23:20   US Transvaginal Non-ob  06/26/2014   CLINICAL DATA:  Vaginal bleeding  EXAM: TRANSABDOMINAL AND TRANSVAGINAL ULTRASOUND OF PELVIS  TECHNIQUE: Both transabdominal and transvaginal ultrasound examinations of the pelvis were performed. Transabdominal technique was performed for global imaging of the pelvis including uterus, ovaries, adnexal regions, and pelvic cul-de-sac. It was necessary to proceed with endovaginal exam following the transabdominal exam to visualize the ovaries.  COMPARISON:  None  FINDINGS: Uterus  Measurements: 7.5 x 3.6 x 5.5 cm. Within the uterine fundus there is a fibroid measuring 3.0 x 2.9 x 3.0 cm.  Endometrium  Thickness: 2 mm. A small amount of fluid noted within the endometrial cavity.  Right ovary  Measurements: Not visualize. No adnexal mass.  Left ovary  Measurements: 7.5 x 6.0 x 6.1 cm. Large anechoic structure within the left ovary measures 6.1 x 5.0 x 5.3 cm.  Other findings  Trace fluid identified near the left ovary.  IMPRESSION: 1. Large fundal fibroid. 2. Left ovary cyst. This is almost certainly benign, but follow up ultrasound is recommended in 1 year according to the Society of Radiologists in [place] Statement (D  Clovis Riley et al. Management of Asymptomatic Ovarian and Other Adnexal Cysts Imaged at [place]: Society of Radiologists in [place] Statement 2010. Radiology 256 (Sept 2010): 681-275.).   Electronically Signed   By: Kerby Moors M.D.   On: 06/26/2014 14:50   US Pelvis Complete  06/26/2014   CLINICAL DATA:  Vaginal bleeding  EXAM: TRANSABDOMINAL AND TRANSVAGINAL ULTRASOUND OF PELVIS  TECHNIQUE: Both transabdominal and transvaginal ultrasound examinations of the pelvis were performed. Transabdominal technique was performed for global imaging of the pelvis including uterus, ovaries, adnexal regions, and pelvic cul-de-sac. It was necessary to proceed with endovaginal exam following the transabdominal exam to visualize the ovaries.  COMPARISON:  None  FINDINGS: Uterus  Measurements: 7.5 x 3.6 x 5.5 cm. Within the uterine fundus there is a fibroid measuring 3.0 x 2.9 x 3.0 cm.  Endometrium  Thickness: 2 mm. A small amount of fluid noted within the endometrial cavity.  Right ovary  Measurements: Not visualize. No adnexal mass.  Left ovary  Measurements: 7.5 x 6.0 x 6.1 cm. Large anechoic structure within the left ovary measures 6.1 x 5.0 x 5.3 cm.  Other findings  Trace fluid identified near the left ovary.  IMPRESSION: 1. Large fundal fibroid. 2. Left ovary cyst. This is almost certainly benign, but follow up ultrasound is recommended in 1 year according to the Society of Radiologists in [place] Statement (D Clovis Riley et al. Management of Asymptomatic Ovarian and Other Adnexal Cysts Imaged at [place]: Society of Radiologists in [place] Statement 2010. Radiology 256 (Sept 2010): 170-017.).   Electronically Signed   By: Kerby Moors M.D.   On: 06/26/2014 14:50        Scheduled Meds: . sodium chloride   Intravenous Once  . atorvastatin  80 mg Oral q1800  . levothyroxine  100 mcg Oral QAC breakfast  . medroxyPROGESTERone  10 mg Oral BID  . metoprolol  succinate  25 mg Oral Daily  . pantoprazole  40 mg Oral Daily  . sodium chloride  3 mL Intravenous Q12H   Continuous Infusions: . sodium chloride 125 mL/hr at 06/26/14 1600    Principal Problem:   Vaginal bleeding Active Problems:   Retrograde amnesia   Near syncope   Acute post-hemorrhagic anemia   GERD   Essential hypertension, benign   CAD (coronary artery disease)   Hypothyroidism   Hyperlipidemia    Time spent: 40 minutes.    Vernell Leep, MD, FACP, FHM. [facility]  Pager [phone]  If 7PM-7AM, please contact night-coverage [email or web address] Password [email or web address] 06/26/2014, 4:09 PM    LOS: 0 days

## 2014-06-26 NOTE — ED Notes (Signed)
Awake. Verbally responsive. A/O x3 with confusion to events. Resp even and unlabored. No audible adventitious breath sounds noted. ABC's intact. SR on monitor. IV patent and intact. Family at bedside.

## 2014-06-26 NOTE — Progress Notes (Signed)
Feeling better. Bleeding scant at this moment.  VSS Afeb Abdomen soft, NT without masses Pelvic - bimanual exam in bed compromised by patient habitus, NT with scant DRB on glove  Results for orders placed or performed during the hospital encounter of 06/25/14 (from the past 24 hour(s))  Wet prep, genital     Status: Abnormal   Collection Time: 06/25/14  8:24 PM  Result Value Ref Range   Yeast Wet Prep HPF POC NONE SEEN NONE SEEN   Trich, Wet Prep NONE SEEN NONE SEEN   Clue Cells Wet Prep HPF POC NONE SEEN NONE SEEN   WBC, Wet Prep HPF POC RARE (A) NONE SEEN  CBC with Differential     Status: None   Collection Time: 06/25/14  8:30 PM  Result Value Ref Range   WBC 9.6 4.0 - 10.5 K/uL   RBC 4.62 3.87 - 5.11 MIL/uL   Hemoglobin 13.8 12.0 - 15.0 g/dL   HCT 41.8 36.0 - 46.0 %   MCV 90.5 78.0 - 100.0 fL   MCH 29.9 26.0 - 34.0 pg   MCHC 33.0 30.0 - 36.0 g/dL   RDW 13.0 11.5 - 15.5 %   Platelets 189 150 - 400 K/uL   Neutrophils Relative % 75 43 - 77 %   Neutro Abs 7.1 1.7 - 7.7 K/uL   Lymphocytes Relative 13 12 - 46 %   Lymphs Abs 1.3 0.7 - 4.0 K/uL   Monocytes Relative 7 3 - 12 %   Monocytes Absolute 0.7 0.1 - 1.0 K/uL   Eosinophils Relative 5 0 - 5 %   Eosinophils Absolute 0.5 0.0 - 0.7 K/uL   Basophils Relative 0 0 - 1 %   Basophils Absolute 0.0 0.0 - 0.1 K/uL  Comprehensive metabolic panel     Status: Abnormal   Collection Time: 06/25/14  8:30 PM  Result Value Ref Range   Sodium 136 135 - 145 mmol/L   Potassium 3.6 3.5 - 5.1 mmol/L   Chloride 101 96 - 112 mmol/L   CO2 25 19 - 32 mmol/L   Glucose, Bld 125 (H) 70 - 99 mg/dL   BUN 19 6 - 23 mg/dL   Creatinine, Ser 0.81 0.50 - 1.10 mg/dL   Calcium 9.2 8.4 - 10.5 mg/dL   Total Protein 6.8 6.0 - 8.3 g/dL   Albumin 4.0 3.5 - 5.2 g/dL   AST 26 0 - 37 U/L   ALT 17 0 - 35 U/L   Alkaline Phosphatase 67 39 - 117 U/L   Total Bilirubin 0.4 0.3 - 1.2 mg/dL   GFR calc non Af Amer 71 (L) >90 mL/min   GFR calc Af Amer 83 (L) >90 mL/min    Anion gap 10 5 - 15  Protime-INR     Status: None   Collection Time: 06/25/14  8:30 PM  Result Value Ref Range   Prothrombin Time 14.4 11.6 - 15.2 seconds   INR 1.10 0.00 - 1.49  APTT     Status: None   Collection Time: 06/25/14  8:30 PM  Result Value Ref Range   aPTT 29 24 - 37 seconds  Sample to Blood Bank     Status: None   Collection Time: 06/25/14  8:30 PM  Result Value Ref Range   Blood Bank Specimen SAMPLE AVAILABLE FOR TESTING    Sample Expiration 06/28/2014   I-Stat CG4 Lactic Acid, ED     Status: Abnormal   Collection Time: 06/25/14  9:51 PM  Result  Value Ref Range   Lactic Acid, Venous 2.54 (HH) 0.5 - 2.0 mmol/L   Comment NOTIFIED PHYSICIAN   CBC with Differential     Status: Abnormal   Collection Time: 06/26/14 12:56 AM  Result Value Ref Range   WBC 10.3 4.0 - 10.5 K/uL   RBC 4.35 3.87 - 5.11 MIL/uL   Hemoglobin 12.8 12.0 - 15.0 g/dL   HCT 38.6 36.0 - 46.0 %   MCV 88.7 78.0 - 100.0 fL   MCH 29.4 26.0 - 34.0 pg   MCHC 33.2 30.0 - 36.0 g/dL   RDW 12.9 11.5 - 15.5 %   Platelets 169 150 - 400 K/uL   Neutrophils Relative % 83 (H) 43 - 77 %   Neutro Abs 8.6 (H) 1.7 - 7.7 K/uL   Lymphocytes Relative 11 (L) 12 - 46 %   Lymphs Abs 1.1 0.7 - 4.0 K/uL   Monocytes Relative 5 3 - 12 %   Monocytes Absolute 0.5 0.1 - 1.0 K/uL   Eosinophils Relative 1 0 - 5 %   Eosinophils Absolute 0.1 0.0 - 0.7 K/uL   Basophils Relative 0 0 - 1 %   Basophils Absolute 0.0 0.0 - 0.1 K/uL  I-Stat CG4 Lactic Acid, ED     Status: None   Collection Time: 06/26/14  1:13 AM  Result Value Ref Range   Lactic Acid, Venous 0.97 0.5 - 2.0 mmol/L  CBC     Status: Abnormal   Collection Time: 06/26/14  7:44 AM  Result Value Ref Range   WBC 7.2 4.0 - 10.5 K/uL   RBC 3.67 (L) 3.87 - 5.11 MIL/uL   Hemoglobin 10.5 (L) 12.0 - 15.0 g/dL   HCT 32.9 (L) 36.0 - 46.0 %   MCV 89.6 78.0 - 100.0 fL   MCH 28.6 26.0 - 34.0 pg   MCHC 31.9 30.0 - 36.0 g/dL   RDW 13.0 11.5 - 15.5 %   Platelets 143 (L) 150 -  400 K/uL  Type and screen     Status: None   Collection Time: 06/26/14 12:08 PM  Result Value Ref Range   ABO/RH(D) A POS    Antibody Screen NEG    Sample Expiration 06/29/2014   CBC     Status: Abnormal   Collection Time: 06/26/14 12:37 PM  Result Value Ref Range   WBC 5.9 4.0 - 10.5 K/uL   RBC 3.47 (L) 3.87 - 5.11 MIL/uL   Hemoglobin 10.3 (L) 12.0 - 15.0 g/dL   HCT 30.9 (L) 36.0 - 46.0 %   MCV 89.0 78.0 - 100.0 fL   MCH 29.7 26.0 - 34.0 pg   MCHC 33.3 30.0 - 36.0 g/dL   RDW 13.2 11.5 - 15.5 %   Platelets 156 150 - 400 K/uL   U/S today Final radiology report pending. There is an approx 7 cm left ovarian cyst-+ flow to ovary, no flow to cyst and some free fluid around ovary. Unable to see right ovary. EM stripe indistinct, small fluid collection C/W bleeding. No obvious clot in endometrial cavity. Prob 3 cm submucous fibroid.  U/S in our office 08/02/10 noted 1.8 cm fibroid-prob submucous, and left ovary had a 4.2 cm clear cyst and a 4.0 cm clear cyst. No free fluid.   CXR normal Head CT last pm normal with cochlear implant  D/W patient and her sister above-need to evaluate ovary and possible ascites  A/P: PMB-currently scant         Enlarged left ovarian cyst  with possible ascites         T&C for 2 units done          Repeat CBC stable          Will order abd/pelvic CT          CBC in am          Gyn Onc consult in am

## 2014-06-27 ENCOUNTER — Inpatient Hospital Stay (HOSPITAL_COMMUNITY)
Admit: 2014-06-27 | Discharge: 2014-06-27 | Disposition: A | Discharge status: Home / Self Care | Payer: Medicare Other | Attending: Internal Medicine | Admitting: Internal Medicine

## 2014-06-27 ENCOUNTER — Encounter (HOSPITAL_COMMUNITY)
Admission: RE | Admit: 2014-06-27 | Discharge: 2014-06-27 | Disposition: A | Discharge status: Home / Self Care | Payer: Self-pay | Source: Ambulatory Visit | Attending: Interventional Cardiology | Admitting: Interventional Cardiology

## 2014-06-27 DIAGNOSIS — D62 Acute posthemorrhagic anemia: Secondary | ICD-10-CM

## 2014-06-27 DIAGNOSIS — I951 Orthostatic hypotension: Secondary | ICD-10-CM

## 2014-06-27 DIAGNOSIS — R412 Retrograde amnesia: Secondary | ICD-10-CM

## 2014-06-27 DIAGNOSIS — R55 Syncope and collapse: Secondary | ICD-10-CM

## 2014-06-27 LAB — URINALYSIS, ROUTINE W REFLEX MICROSCOPIC
BILIRUBIN URINE: NEGATIVE
GLUCOSE, UA: NEGATIVE mg/dL
Ketones, ur: 15 mg/dL — AB
Nitrite: NEGATIVE
Protein, ur: NEGATIVE mg/dL
SPECIFIC GRAVITY, URINE: 1.017 (ref 1.005–1.030)
UROBILINOGEN UA: 1 mg/dL (ref 0.0–1.0)
pH: 5 (ref 5.0–8.0)

## 2014-06-27 LAB — CBC
HCT: 25.9 % — ABNORMAL LOW (ref 36.0–46.0)
HEMATOCRIT: 24.2 % — AB (ref 36.0–46.0)
HEMOGLOBIN: 8.4 g/dL — AB (ref 12.0–15.0)
Hemoglobin: 8 g/dL — ABNORMAL LOW (ref 12.0–15.0)
MCH: 29.4 pg (ref 26.0–34.0)
MCH: 30.3 pg (ref 26.0–34.0)
MCHC: 32.4 g/dL (ref 30.0–36.0)
MCHC: 33.1 g/dL (ref 30.0–36.0)
MCV: 90.6 fL (ref 78.0–100.0)
MCV: 91.7 fL (ref 78.0–100.0)
PLATELETS: 129 10*3/uL — AB (ref 150–400)
Platelets: 128 10*3/uL — ABNORMAL LOW (ref 150–400)
RBC: 2.86 MIL/uL — ABNORMAL LOW (ref 3.87–5.11)
RBC: 2.64 MIL/uL — ABNORMAL LOW (ref 3.87–5.11)
RDW: 13.4 % (ref 11.5–15.5)
RDW: 13.6 % (ref 11.5–15.5)
WBC: 6.4 10*3/uL (ref 4.0–10.5)
WBC: 10.7 10*3/uL — ABNORMAL HIGH (ref 4.0–10.5)

## 2014-06-27 LAB — URINE MICROSCOPIC-ADD ON

## 2014-06-27 LAB — BASIC METABOLIC PANEL
ANION GAP: 4 — AB (ref 5–15)
BUN: 12 mg/dL (ref 6–23)
CHLORIDE: 114 mmol/L — AB (ref 96–112)
CO2: 24 mmol/L (ref 19–32)
CREATININE: 0.56 mg/dL (ref 0.50–1.10)
Calcium: 8 mg/dL — ABNORMAL LOW (ref 8.4–10.5)
GFR calc Af Amer: >90 mL/min (ref >90)
GFR calc non Af Amer: >90 mL/min (ref >90)
GLUCOSE: 86 mg/dL (ref 70–99)
Potassium: 3.8 mmol/L (ref 3.5–5.1)
Sodium: 142 mmol/L (ref 135–145)

## 2014-06-27 LAB — PREPARE RBC (CROSSMATCH)

## 2014-06-27 MED ORDER — SODIUM CHLORIDE 0.9 % IV BOLUS (SEPSIS)
250.0000 mL | Freq: Once | INTRAVENOUS | Status: AC
Start: 2014-06-27 — End: 2014-06-27
  Administered 2014-06-27: 250 mL via INTRAVENOUS

## 2014-06-27 MED ORDER — SODIUM CHLORIDE 0.9 % IV SOLN: INTRAVENOUS | Status: DC

## 2014-06-27 MED ORDER — SODIUM CHLORIDE 0.9 % IV SOLN: Freq: Once | INTRAVENOUS | Status: AC

## 2014-06-27 MED ORDER — SODIUM CHLORIDE 0.9 % IV SOLN
INTRAVENOUS | Status: DC
  Administered 2014-06-27: 14:00:00 via INTRAVENOUS

## 2014-06-27 NOTE — Progress Notes (Signed)
Offsite EEG completed at [facility]. Results pending. 

## 2014-06-27 NOTE — Plan of Care (Signed)
Problem: Phase I Progression Outcomes Goal: OOB as tolerated unless otherwise ordered Outcome: Completed/Met Date Met:  06/27/14 oob with assist

## 2014-06-27 NOTE — Progress Notes (Signed)
Patient is asymptomatic until standing when she has dizziness.  Scant VB until this pm when bleeding began again.  Patient reports this episode is not nearly as heavy as VB on admission.  No pain.  VSS. AF. Hgb 8.0  Gen: pale appearing, NAD Abd: soft, ND Pelvic: nl ext female genitalia.  SSE with moderate amount of bright red blood and golf ball sized clot. Ext: no c/c/e  73yo with PMB -Dr. [name] of GYN/ONC was called and recommended stabilizing patient's bleeding and follow-up outpatient to plan surgical management. She will schedule appointment and place information on chart.  Should emergent situation arise, call to evaluate in-patient. -Draw CA-125 -Regular diet -Continue progesterone.  Consider adding estrogen if bleeding increases. -Transfusing 1u PRBCs -Will follow along until stable.   [name]. DO

## 2014-06-27 NOTE — Progress Notes (Addendum)
PROGRESS NOTE    [name] RJJ:[id number] DOB: 02-Dec-1940 DOA: 06/25/2014 PCP: [name], MD  GYN: Dr. Molli Posey Cardiology: Dr. Casandra Doffing. ENT: Dr. Benjamine Mola & Thornell Mule  HPI/Brief narrative 74 year old female patient with history of GERD, hypothyroid, hearing loss status post right cochlear implant and hearing aid, uterine fibroids with prior history of vaginal bleeding, CAD status post stent January 2015, essential hypertension and HLD admitted to the hospital on 06/26/14 with onset of heavy vaginal bleeding since 4 PM the evening prior. She denies abdominal pain. Off note, patient complains of amnesia to events leading from onset of vaginal bleeding to ED and remembers events since. Denies any other strokelike symptoms. GYN consulted.  Assessment/Plan:  Principal Problem:   Vaginal bleeding - GYN consultation by Dr. [name] appreciated. - As per recommendations, need to evaluate ovary and possible ascites - Abdominal and pelvic CT results as below: 6.8 cm cystic abnormality seen in left adnexal region and abnormal endometrial thickening and postmenopausal woman concerning for malignancy. - NPO per Gyn - GYN ONC consult in a.m. - Started on medroxyprogesterone/Provera 10 MG twice a day - Aspirin and Plavix held (over 1 year out from her recent coronary stent in January 2015) - As per patient, vaginal bleeding has significantly improved. - We'll need definitive management for this problem given significant blood loss since admission (hemoglobin is dropped from 13.8 on admission to 8.4, patient is orthostatic and symptomatic) - Discussed this am before noon with Dr. [name], Gyn who plans to see patient today and make further recommendations.  Active Problems:   Retrograde amnesia - Patient has amnesia regarding when vaginal bleed started, calling EMS, opening door for them, transport to the hospital, vaginal exam in the ED. - No focal deficits. - CT head without  acute findings. - Unclear etiology.  - Discussed with Neuro Hospitalist 06/26/14: suggests Transient Global Amnesia. Get EEG and if normal no other work up. - Patient has no further episodes of amnesia since admission.    Near syncope - Occurred 4/10 afternoon while ambulating to bathroom - Likely related to acute blood loss and orthostatic changes  - Bedrest  - check orthostatic blood pressures -significantly positive -  IV fluids  - Transfuse PRBCs as needed  Orthostatic hypotension - Secondary to acute blood loss - IV fluids, bedrest and PRBC as needed    Acute post-hemorrhagic anemia - Secondary to vaginal bleeding  - Monitor CBCs closely and transfuse if hemoglobin less than 8 g per DL  - Hemoglobin has dropped from 13.8 on 4/9 to 8.4 on 4/11 - Monitor CBCs closely and transfuse if hemoglobin drops below 8 g per DL - We will need definitive treatment of underlying cause.  Mild thrombocytopenia - Likely secondary to acute blood loss anemia - Follow CBCs closely     GERD - Continue PPI     Essential hypertension, benign - Controlled. - Continue metoprolol    CAD (coronary artery disease) - Antiplatelets held secondary to significant vaginal bleeding (Informed Primary Cardiologist 4/11 who agreed). - continue metoprolol and statins    Hypothyroidism - Continue levothyroxine     Hyperlipidemia - Continue statins      Code Status: Full Family Communication: Discussed with sister Ms. Allen Norris 06/27/14. Disposition Plan: DC home in 3-4 days when medically stable.Patient is still quite ill and at high risk for decline.   Consultants:  Gyn  Procedures:  None  Antibiotics:  None   Subjective: States that vaginal bleeding has  significantly decreased. Dizziness on standing and feeling like she will pass out. Denies chest pain or dyspnea. No episodes of amnesia since admission.  Objective: Filed Vitals:   06/26/14 1159 06/26/14 2130 06/27/14 0133  06/27/14 0536  BP:   93/42 106/33  Pulse: 59 64 67 62  Temp: 97.9 F (36.6 C) 98.6 F (37 C) 99 F (37.2 C) 98.4 F (36.9 C)  TempSrc: Oral Oral Oral Oral  Resp: 18 18 18 18   Height:      Weight:      SpO2: 100% 99% 100% 100%    Intake/Output Summary (Last 24 hours) at 06/27/14 0939 Last data filed at 06/26/14 2141  Gross per 24 hour  Intake   1020 ml  Output      2 ml  Net   1018 ml   Filed Weights   06/26/14 0216  Weight: 92.6 kg (204 lb 2.3 oz)     Exam:  General exam: pleasant elderly female lying comfortably supine in bed.  Respiratory system: Clear. No increased work of breathing. Cardiovascular system: S1 & S2 heard, RRR. No JVD, murmurs, gallops, clicks or pedal edema. Telemetry: SB mid 50's-SR.  Gastrointestinal system: Abdomen is nondistended, soft and nontender. Normal bowel sounds heard. Central nervous system: Alert and oriented. No focal neurological deficits. Extremities: Symmetric 5 x 5 power.   Data Reviewed: Basic Metabolic Panel:  Recent Labs Lab 06/25/14 2030 06/27/14 0521  NA 136 142  K 3.6 3.8  CL 101 114*  CO2 25 24  GLUCOSE 125* 86  BUN 19 12  CREATININE 0.81 0.56  CALCIUM 9.2 8.0*   Liver Function Tests:  Recent Labs Lab 06/25/14 2030  AST 26  ALT 17  ALKPHOS 67  BILITOT 0.4  PROT 6.8  ALBUMIN 4.0   No results for input(s): LIPASE, AMYLASE in the last 168 hours. No results for input(s): AMMONIA in the last 168 hours. CBC:  Recent Labs Lab 06/25/14 2030 06/26/14 0056 06/26/14 0744 06/26/14 1237 06/26/14 1916 06/27/14 0521  WBC 9.6 10.3 7.2 5.9 7.6 6.4  NEUTROABS 7.1 8.6*  --   --   --   --   HGB 13.8 12.8 10.5* 10.3* 9.6* 8.4*  HCT 41.8 38.6 32.9* 30.9* 29.1* 25.9*  MCV 90.5 88.7 89.6 89.0 90.7 90.6  PLT 189 169 143* 156 159 129*   Cardiac Enzymes: No results for input(s): CKTOTAL, CKMB, CKMBINDEX, TROPONINI in the last 168 hours. BNP (last 3 results) No results for input(s): PROBNP in the last 8760  hours. CBG:  Recent Labs Lab 06/26/14 1856  GLUCAP 104*    Recent Results (from the past 240 hour(s))  Wet prep, genital     Status: Abnormal   Collection Time: 06/25/14  8:24 PM  Result Value Ref Range Status   Yeast Wet Prep HPF POC NONE SEEN NONE SEEN Final   Trich, Wet Prep NONE SEEN NONE SEEN Final   Clue Cells Wet Prep HPF POC NONE SEEN NONE SEEN Final   WBC, Wet Prep HPF POC RARE (A) NONE SEEN Final        Studies: Ct Abdomen Pelvis Wo Contrast  06/26/2014   CLINICAL DATA:  Heavy vaginal bleeding.  EXAM: CT ABDOMEN AND PELVIS WITHOUT CONTRAST  TECHNIQUE: Multidetector CT imaging of the abdomen and pelvis was performed following the standard protocol without IV contrast.  COMPARISON:  None.  FINDINGS: Multilevel degenerative disc disease is noted in the lumbar spine. Visualized lung bases appear normal.  Status post cholecystectomy.  No focal abnormality is seen in the liver, spleen or pancreas on these unenhanced images. Adrenal glands appear normal. No hydronephrosis or renal obstruction is noted. No renal or ureteral calculi are noted. Small fat containing periumbilical hernia is noted. The appendix appears normal. There is no evidence of bowel obstruction. Urinary bladder appear normal. Significant endometrial thickening is noted in the uterus which is abnormal for postmenopausal patient. 6.8 cm cyst is noted in the left adnexal region. Large amount of stool is noted in the distal sigmoid colon and rectum concerning for impaction. No significant adenopathy is noted.  IMPRESSION: 6.8 cm cystic abnormality seen in left adnexal region; also noted is probable significant endometrial thickening which is abnormal for a postmenopausal patient. This is concerning for possible endometrial neoplasm or malignancy, and pelvic ultrasound is recommended for further evaluation.  Small fat containing periumbilical hernia.  Large amount of stool seen in distal sigmoid colon and rectum concerning for  impaction.   Electronically Signed   By: Marijo Conception, M.D.   On: 06/26/2014 19:40   Dg Chest 2 View  06/26/2014   CLINICAL DATA:  Elevated lactic acid.  Confusion.  EXAM: CHEST  2 VIEW  COMPARISON:  04/08/2013  FINDINGS: Normal heart size and pulmonary vascularity. No focal airspace disease or consolidation in the lungs. No blunting of costophrenic angles. No pneumothorax. Mediastinal contours appear intact. Degenerative changes in the spine.  IMPRESSION: No active cardiopulmonary disease.   Electronically Signed   By: Lucienne Capers M.D.   On: 06/26/2014 00:06   Ct Head Wo Contrast  06/25/2014   CLINICAL DATA:  Dizziness and confusion. Short-term memory loss. Patient is deaf with implant.  EXAM: CT HEAD WITHOUT CONTRAST  TECHNIQUE: Contiguous axial images were obtained from the base of the skull through the vertex without intravenous contrast.  COMPARISON:  CT temporal bones 12/22/2007  FINDINGS: Implant in the right temporal bone. Streak artifact from the implant limits visualization of some areas. Ventricles and sulci appear symmetrical. No mass effect or midline shift. No abnormal extra-axial fluid collections. Gray-white matter junctions are distinct. Basal cisterns are not effaced. No evidence of acute intracranial hemorrhage. No depressed skull fractures. Visualized paranasal sinuses and mastoid air cells are not opacified.  IMPRESSION: No acute intracranial abnormalities.  Right temporal implant.   Electronically Signed   By: Lucienne Capers M.D.   On: 06/25/2014 23:20   US Transvaginal Non-ob  06/26/2014   CLINICAL DATA:  Vaginal bleeding  EXAM: TRANSABDOMINAL AND TRANSVAGINAL ULTRASOUND OF PELVIS  TECHNIQUE: Both transabdominal and transvaginal ultrasound examinations of the pelvis were performed. Transabdominal technique was performed for global imaging of the pelvis including uterus, ovaries, adnexal regions, and pelvic cul-de-sac. It was necessary to proceed with endovaginal exam  following the transabdominal exam to visualize the ovaries.  COMPARISON:  None  FINDINGS: Uterus  Measurements: 7.5 x 3.6 x 5.5 cm. Within the uterine fundus there is a fibroid measuring 3.0 x 2.9 x 3.0 cm.  Endometrium  Thickness: 2 mm. A small amount of fluid noted within the endometrial cavity.  Right ovary  Measurements: Not visualize. No adnexal mass.  Left ovary  Measurements: 7.5 x 6.0 x 6.1 cm. Large anechoic structure within the left ovary measures 6.1 x 5.0 x 5.3 cm.  Other findings  Trace fluid identified near the left ovary.  IMPRESSION: 1. Large fundal fibroid. 2. Left ovary cyst. This is almost certainly benign, but follow up ultrasound is recommended in 1 year according to the Society  of Radiologists in [place] Statement (D Clovis Riley et al. Management of Asymptomatic Ovarian and Other Adnexal Cysts Imaged at [place]: Society of Radiologists in [place] Statement 2010. Radiology 256 (Sept 2010): 629-476.).   Electronically Signed   By: Kerby Moors M.D.   On: 06/26/2014 14:50   US Pelvis Complete  06/26/2014   CLINICAL DATA:  Vaginal bleeding  EXAM: TRANSABDOMINAL AND TRANSVAGINAL ULTRASOUND OF PELVIS  TECHNIQUE: Both transabdominal and transvaginal ultrasound examinations of the pelvis were performed. Transabdominal technique was performed for global imaging of the pelvis including uterus, ovaries, adnexal regions, and pelvic cul-de-sac. It was necessary to proceed with endovaginal exam following the transabdominal exam to visualize the ovaries.  COMPARISON:  None  FINDINGS: Uterus  Measurements: 7.5 x 3.6 x 5.5 cm. Within the uterine fundus there is a fibroid measuring 3.0 x 2.9 x 3.0 cm.  Endometrium  Thickness: 2 mm. A small amount of fluid noted within the endometrial cavity.  Right ovary  Measurements: Not visualize. No adnexal mass.  Left ovary  Measurements: 7.5 x 6.0 x 6.1 cm. Large anechoic structure within the left ovary measures 6.1 x 5.0 x  5.3 cm.  Other findings  Trace fluid identified near the left ovary.  IMPRESSION: 1. Large fundal fibroid. 2. Left ovary cyst. This is almost certainly benign, but follow up ultrasound is recommended in 1 year according to the Society of Radiologists in [place] Statement (D Clovis Riley et al. Management of Asymptomatic Ovarian and Other Adnexal Cysts Imaged at [place]: Society of Radiologists in [place] Statement 2010. Radiology 256 (Sept 2010): 546-503.).   Electronically Signed   By: Kerby Moors M.D.   On: 06/26/2014 14:50        Scheduled Meds: . sodium chloride   Intravenous Once  . atorvastatin  80 mg Oral q1800  . levothyroxine  100 mcg Oral QAC breakfast  . medroxyPROGESTERone  10 mg Oral BID  . metoprolol succinate  25 mg Oral Daily  .  morphine injection  1 mg Intravenous Once  . pantoprazole  40 mg Oral Daily  . sodium chloride  3 mL Intravenous Q12H  . sodium phosphate  1 enema Rectal Once   Continuous Infusions:    Principal Problem:   Vaginal bleeding Active Problems:   Retrograde amnesia   Near syncope   Acute post-hemorrhagic anemia   GERD   Essential hypertension, benign   CAD (coronary artery disease)   Hypothyroidism   Hyperlipidemia    Time spent: 40 minutes. 50% of time was spent in updating patient and family regarding her ongoing care, answering questions, outlining prognosis, Hospital course and discharge plans.    Vernell Leep, MD, FACP, FHM. [facility] Pager [phone]  If 7PM-7AM, please contact night-coverage [email or web address] Password [email or web address] 06/27/2014, 9:39 AM    LOS: 1 day

## 2014-06-27 NOTE — Progress Notes (Addendum)
Patient again experiencing active vaginal bleeding. Notified Dr. Algis Liming & Dr. [name], OBGYN. MD placed order to transfuse 1 unit PRBC.

## 2014-06-27 NOTE — Procedures (Signed)
ELECTROENCEPHALOGRAM REPORT   Patient: [name]       Room #: FW2637 EEG No. ID: [id number] Age: 74 y.o.        Sex: female Referring Physician: Hongalgi Report Date:  06/27/2014        Interpreting Physician: Alexis Goodell  History: [name] is an 74 y.o. female s/p an episode of amnesia  Medications:  Scheduled: . sodium chloride   Intravenous Once  . sodium chloride   Intravenous Once  . atorvastatin  80 mg Oral q1800  . levothyroxine  100 mcg Oral QAC breakfast  . medroxyPROGESTERone  10 mg Oral BID  . metoprolol succinate  25 mg Oral Daily  . pantoprazole  40 mg Oral Daily  . sodium chloride  3 mL Intravenous Q12H  . sodium phosphate  1 enema Rectal Once    Conditions of Recording:  This is a 16 channel EEG carried out with the patient in the awake state.  Description:  The waking background activity consists of a low voltage, symmetrical, fairly well organized, 10 Hz alpha activity, seen from the parieto-occipital and posterior temporal regions.  Low voltage fast activity, poorly organized, is seen anteriorly and is at times superimposed on more posterior regions.  A mixture of theta and alpha rhythms are seen from the central and temporal regions. The patient does not drowse or sleep. No epileptiform activity is noted. Hyperventilation and intermittent photic stimulation were not performed.   IMPRESSION: This is a normal awake electroencephalogram.  No epileptiform activity is noted.  Comment:  An EEG with the patient sleep deprived to elicit drowse and light sleep may be desirable to further elicit a possible seizure disorder.     Alexis Goodell, MD [facility] [phone] 06/27/2014, 6:27 PM

## 2014-06-28 LAB — CBC
HEMATOCRIT: 25.9 % — AB (ref 36.0–46.0)
HEMATOCRIT: 25.8 % — AB (ref 36.0–46.0)
HEMATOCRIT: 25.1 % — AB (ref 36.0–46.0)
Hemoglobin: 8.5 g/dL — ABNORMAL LOW (ref 12.0–15.0)
Hemoglobin: 8.6 g/dL — ABNORMAL LOW (ref 12.0–15.0)
Hemoglobin: 8.2 g/dL — ABNORMAL LOW (ref 12.0–15.0)
MCH: 29.4 pg (ref 26.0–34.0)
MCH: 29.6 pg (ref 26.0–34.0)
MCH: 29.5 pg (ref 26.0–34.0)
MCHC: 32.8 g/dL (ref 30.0–36.0)
MCHC: 33.3 g/dL (ref 30.0–36.0)
MCHC: 32.7 g/dL (ref 30.0–36.0)
MCV: 89.6 fL (ref 78.0–100.0)
MCV: 88.7 fL (ref 78.0–100.0)
MCV: 90.3 fL (ref 78.0–100.0)
PLATELETS: 122 10*3/uL — AB (ref 150–400)
PLATELETS: 143 10*3/uL — AB (ref 150–400)
Platelets: 123 10*3/uL — ABNORMAL LOW (ref 150–400)
RBC: 2.89 MIL/uL — ABNORMAL LOW (ref 3.87–5.11)
RBC: 2.91 MIL/uL — ABNORMAL LOW (ref 3.87–5.11)
RBC: 2.78 MIL/uL — ABNORMAL LOW (ref 3.87–5.11)
RDW: 14.3 % (ref 11.5–15.5)
RDW: 14.2 % (ref 11.5–15.5)
RDW: 14.1 % (ref 11.5–15.5)
WBC: 6.2 10*3/uL (ref 4.0–10.5)
WBC: 6.1 10*3/uL (ref 4.0–10.5)
WBC: 6.8 10*3/uL (ref 4.0–10.5)

## 2014-06-28 LAB — TYPE AND SCREEN
ABO/RH(D): A POS
ANTIBODY SCREEN: NEGATIVE
Unit division: 0

## 2014-06-28 LAB — URINE CULTURE: Colony Count: >=100000

## 2014-06-28 LAB — HEMOGLOBIN AND HEMATOCRIT, BLOOD
HCT: 26.1 % — ABNORMAL LOW (ref 36.0–46.0)
Hemoglobin: 8.7 g/dL — ABNORMAL LOW (ref 12.0–15.0)

## 2014-06-28 MED ORDER — SODIUM CHLORIDE 0.9 % IV SOLN
INTRAVENOUS | Status: DC
  Administered 2014-06-28 (×2): via INTRAVENOUS

## 2014-06-28 NOTE — Progress Notes (Addendum)
PROGRESS NOTE    [name] GHW:[id number] DOB: Nov 27, 1940 DOA: 06/25/2014 PCP: [name], MD  GYN: Dr. Molli Posey Cardiology: Dr. Casandra Doffing. ENT: Dr. Benjamine Mola & Thornell Mule  HPI/Brief narrative 74 year old female patient with history of GERD, hypothyroid, hearing loss status post right cochlear implant and hearing aid, uterine fibroids with prior history of vaginal bleeding, CAD status post stent January 2015, essential hypertension and HLD admitted to the hospital on 06/26/14 with onset of heavy vaginal bleeding since 4 PM the evening prior. She denies abdominal pain. Off note, patient complains of amnesia to events leading from onset of vaginal bleeding to ED and remembers events since. Denies any other strokelike symptoms. GYN following. Vaginal bleeding decreased. GYN recommends stabilizing patient's bleeding and outpatient follow-up with Dr. [name] off GYN/ONC to decide further management and contact Dr. [name] to evaluate should emergent situation arise. Transfused a unit of PRBC 4/11. Monitor additional 24 hours and if vaginal bleeding continues to decrease, hemoglobin remained stable and not symptomatic/orthostatic, possible discharge 4/13  Assessment/Plan:  Principal Problem:   Vaginal bleeding - GYN consultation by Dr. [name] appreciated. - As per recommendations, need to evaluate ovary and possible ascites - Abdominal and pelvic CT results as below: 6.8 cm cystic abnormality seen in left adnexal region and abnormal endometrial thickening and postmenopausal woman concerning for malignancy. - Started on medroxyprogesterone/Provera 10 MG twice a day - Aspirin and Plavix held (over 1 year out from her recent coronary stent in January 2015) - We'll need definitive management for this problem given significant blood loss since admission (hemoglobin has dropped from 13.8 on admission to 8.4, patient is orthostatic and symptomatic) - GYN follow-up by Dr. [name] 4/11  appreciated: Continue progesterone, consider adding estrogen if bleeding increases, follow CA 125 results and outpatient follow-up with GYN/ONC as indicated above. GYN will follow along until stable. - Vaginal bleeding had transiently increased yesterday but according to patient has significantly improved since.  Active Problems:   Retrograde amnesia/possible TGA - Patient has amnesia regarding when vaginal bleed started, calling EMS, opening door for them, transport to the hospital, vaginal exam in the ED. - No focal deficits. - CT head without acute findings. - Unclear etiology.  - Discussed with Neuro Hospitalist 06/26/14: suggests Transient Global Amnesia. Get EEG and if normal no other work up. - Patient has no further episodes of amnesia since admission. - EEG normal. No further similar episodes    Near syncope - Occurred 4/10 afternoon while ambulating to bathroom - Secondary to acute blood loss and orthostatic changes  - check orthostatic blood pressures -were significantly positive but improving -  IV fluids  - Transfuse PRBCs as needed - Up with assistance and PT eval  Orthostatic hypotension - Secondary to acute blood loss - IV fluids, bedrest and PRBC as needed - Improved today. Continue IV fluids for additional 24 hours and mobilize gradually    Acute post-hemorrhagic anemia - Secondary to vaginal bleeding  - Monitor CBCs closely and transfuse if hemoglobin less than 8 g per DL  - Hemoglobin has dropped from 13.8 on 4/9 to 8 on 4/11 - Monitor CBCs closely and transfuse if hemoglobin drops below 8 g per DL - We will need definitive treatment of underlying cause. - S/P 1 unit PRBC on 4/11. Hemoglobin improved to 8.6 g per DL  Mild thrombocytopenia - Likely secondary to acute blood loss anemia - Follow CBCs closely  - Stable    GERD - Continue PPI  Essential hypertension, benign - Controlled. - Continue metoprolol    CAD (coronary artery disease) -  Antiplatelets held secondary to significant vaginal bleeding (Informed Primary Cardiologist 4/11 who agreed). - continue metoprolol and statins    Hypothyroidism - Continue levothyroxine     Hyperlipidemia - Continue statins      Code Status: Full Family Communication: Discussed with sister Ms. Allen Norris 06/27/14. Disposition Plan: DC home possibly in the next 24 hours if medically stable   Consultants:  Gyn  Procedures:  EEG 06/27/14: IMPRESSION: This is a normal awake electroencephalogram. No epileptiform activity is noted.  Comment: An EEG with the patient sleep deprived to elicit drowse and light sleep may be desirable to further elicit a possible seizure disorder.   Antibiotics:  None   Subjective: Feels much better. Vaginal bleeding has significantly improved-minimal. Was able to sit at edge of bed this morning with improved/minimal dizziness.  Objective: Filed Vitals:   06/27/14 1830 06/27/14 2129 06/27/14 2145 06/28/14 0518  BP: 113/36 103/33 103/35 115/41  Pulse: 66 68 68 78  Temp: 98.7 F (37.1 C) 99.3 F (37.4 C) 99.3 F (37.4 C) 99.8 F (37.7 C)  TempSrc: Oral Oral Oral Oral  Resp: 20 20 18 18   Height:      Weight:      SpO2: 99% 100% 100% 100%    Intake/Output Summary (Last 24 hours) at 06/28/14 1257 Last data filed at 06/28/14 0826  Gross per 24 hour  Intake 2647.5 ml  Output      0 ml  Net 2647.5 ml   Filed Weights   06/26/14 0216  Weight: 92.6 kg (204 lb 2.3 oz)     Exam:  General exam: pleasant elderly female lying comfortably supine in bed.  Respiratory system: Clear. No increased work of breathing. Cardiovascular system: S1 & S2 heard, RRR. No JVD, murmurs, gallops, clicks or pedal edema. Telemetry: SR.  Gastrointestinal system: Abdomen is nondistended, soft and nontender. Normal bowel sounds heard. Central nervous system: Alert and oriented. No focal neurological deficits. Extremities: Symmetric 5 x 5 power.   Data  Reviewed: Basic Metabolic Panel:  Recent Labs Lab 06/25/14 2030 06/27/14 0521  NA 136 142  K 3.6 3.8  CL 101 114*  CO2 25 24  GLUCOSE 125* 86  BUN 19 12  CREATININE 0.81 0.56  CALCIUM 9.2 8.0*   Liver Function Tests:  Recent Labs Lab 06/25/14 2030  AST 26  ALT 17  ALKPHOS 67  BILITOT 0.4  PROT 6.8  ALBUMIN 4.0   No results for input(s): LIPASE, AMYLASE in the last 168 hours. No results for input(s): AMMONIA in the last 168 hours. CBC:  Recent Labs Lab 06/25/14 2030 06/26/14 0056  06/26/14 1916 06/27/14 0521 06/27/14 1244 06/28/14 0031 06/28/14 0510 06/28/14 1236  WBC 9.6 10.3  < > 7.6 6.4 10.7*  --  6.2 6.1  NEUTROABS 7.1 8.6*  --   --   --   --   --   --   --   HGB 13.8 12.8  < > 9.6* 8.4* 8.0* 8.7* 8.5* 8.6*  HCT 41.8 38.6  < > 29.1* 25.9* 24.2* 26.1* 25.9* 25.8*  MCV 90.5 88.7  < > 90.7 90.6 91.7  --  89.6 88.7  PLT 189 169  < > 159 129* 128*  --  123* 122*  < > = values in this interval not displayed. Cardiac Enzymes: No results for input(s): CKTOTAL, CKMB, CKMBINDEX, TROPONINI in the last 168 hours. BNP (  last 3 results) No results for input(s): PROBNP in the last 8760 hours. CBG:  Recent Labs Lab 06/26/14 1856  GLUCAP 104*    Recent Results (from the past 240 hour(s))  Wet prep, genital     Status: Abnormal   Collection Time: 06/25/14  8:24 PM  Result Value Ref Range Status   Yeast Wet Prep HPF POC NONE SEEN NONE SEEN Final   Trich, Wet Prep NONE SEEN NONE SEEN Final   Clue Cells Wet Prep HPF POC NONE SEEN NONE SEEN Final   WBC, Wet Prep HPF POC RARE (A) NONE SEEN Final  Urine culture     Status: None   Collection Time: 06/27/14 10:29 AM  Result Value Ref Range Status   Specimen Description URINE, CLEAN CATCH  Final   Special Requests NONE  Final   Colony Count   Final    >=100,000 COLONIES/ML Performed at [facility]    Culture   Final    Multiple bacterial morphotypes present, none predominant. Suggest appropriate  recollection if clinically indicated. Performed at [facility]    Report Status 06/28/2014 FINAL  Final        Studies: Ct Abdomen Pelvis Wo Contrast  06/26/2014   CLINICAL DATA:  Heavy vaginal bleeding.  EXAM: CT ABDOMEN AND PELVIS WITHOUT CONTRAST  TECHNIQUE: Multidetector CT imaging of the abdomen and pelvis was performed following the standard protocol without IV contrast.  COMPARISON:  None.  FINDINGS: Multilevel degenerative disc disease is noted in the lumbar spine. Visualized lung bases appear normal.  Status post cholecystectomy. No focal abnormality is seen in the liver, spleen or pancreas on these unenhanced images. Adrenal glands appear normal. No hydronephrosis or renal obstruction is noted. No renal or ureteral calculi are noted. Small fat containing periumbilical hernia is noted. The appendix appears normal. There is no evidence of bowel obstruction. Urinary bladder appear normal. Significant endometrial thickening is noted in the uterus which is abnormal for postmenopausal patient. 6.8 cm cyst is noted in the left adnexal region. Large amount of stool is noted in the distal sigmoid colon and rectum concerning for impaction. No significant adenopathy is noted.  IMPRESSION: 6.8 cm cystic abnormality seen in left adnexal region; also noted is probable significant endometrial thickening which is abnormal for a postmenopausal patient. This is concerning for possible endometrial neoplasm or malignancy, and pelvic ultrasound is recommended for further evaluation.  Small fat containing periumbilical hernia.  Large amount of stool seen in distal sigmoid colon and rectum concerning for impaction.   Electronically Signed   By: Marijo Conception, M.D.   On: 06/26/2014 19:40   US Transvaginal Non-ob  06/26/2014   CLINICAL DATA:  Vaginal bleeding  EXAM: TRANSABDOMINAL AND TRANSVAGINAL ULTRASOUND OF PELVIS  TECHNIQUE: Both transabdominal and transvaginal ultrasound examinations of the pelvis  were performed. Transabdominal technique was performed for global imaging of the pelvis including uterus, ovaries, adnexal regions, and pelvic cul-de-sac. It was necessary to proceed with endovaginal exam following the transabdominal exam to visualize the ovaries.  COMPARISON:  None  FINDINGS: Uterus  Measurements: 7.5 x 3.6 x 5.5 cm. Within the uterine fundus there is a fibroid measuring 3.0 x 2.9 x 3.0 cm.  Endometrium  Thickness: 2 mm. A small amount of fluid noted within the endometrial cavity.  Right ovary  Measurements: Not visualize. No adnexal mass.  Left ovary  Measurements: 7.5 x 6.0 x 6.1 cm. Large anechoic structure within the left ovary measures 6.1 x 5.0 x  5.3 cm.  Other findings  Trace fluid identified near the left ovary.  IMPRESSION: 1. Large fundal fibroid. 2. Left ovary cyst. This is almost certainly benign, but follow up ultrasound is recommended in 1 year according to the Society of Radiologists in [place] Statement (D Clovis Riley et al. Management of Asymptomatic Ovarian and Other Adnexal Cysts Imaged at [place]: Society of Radiologists in [place] Statement 2010. Radiology 256 (Sept 2010): 923-300.).   Electronically Signed   By: Kerby Moors M.D.   On: 06/26/2014 14:50   US Pelvis Complete  06/26/2014   CLINICAL DATA:  Vaginal bleeding  EXAM: TRANSABDOMINAL AND TRANSVAGINAL ULTRASOUND OF PELVIS  TECHNIQUE: Both transabdominal and transvaginal ultrasound examinations of the pelvis were performed. Transabdominal technique was performed for global imaging of the pelvis including uterus, ovaries, adnexal regions, and pelvic cul-de-sac. It was necessary to proceed with endovaginal exam following the transabdominal exam to visualize the ovaries.  COMPARISON:  None  FINDINGS: Uterus  Measurements: 7.5 x 3.6 x 5.5 cm. Within the uterine fundus there is a fibroid measuring 3.0 x 2.9 x 3.0 cm.  Endometrium  Thickness: 2 mm. A small amount of fluid noted  within the endometrial cavity.  Right ovary  Measurements: Not visualize. No adnexal mass.  Left ovary  Measurements: 7.5 x 6.0 x 6.1 cm. Large anechoic structure within the left ovary measures 6.1 x 5.0 x 5.3 cm.  Other findings  Trace fluid identified near the left ovary.  IMPRESSION: 1. Large fundal fibroid. 2. Left ovary cyst. This is almost certainly benign, but follow up ultrasound is recommended in 1 year according to the Society of Radiologists in [place] Statement (D Clovis Riley et al. Management of Asymptomatic Ovarian and Other Adnexal Cysts Imaged at [place]: Society of Radiologists in [place] Statement 2010. Radiology 256 (Sept 2010): 762-263.).   Electronically Signed   By: Kerby Moors M.D.   On: 06/26/2014 14:50        Scheduled Meds: . atorvastatin  80 mg Oral q1800  . levothyroxine  100 mcg Oral QAC breakfast  . medroxyPROGESTERone  10 mg Oral BID  . metoprolol succinate  25 mg Oral Daily  . pantoprazole  40 mg Oral Daily  . sodium chloride  3 mL Intravenous Q12H   Continuous Infusions: . sodium chloride 75 mL/hr at 06/28/14 1035    Principal Problem:   Vaginal bleeding Active Problems:   Retrograde amnesia   Near syncope   Acute post-hemorrhagic anemia   GERD   Essential hypertension, benign   CAD (coronary artery disease)   Hypothyroidism   Hyperlipidemia    Time spent: 44 minutes    Christon Parada, MD, FACP, FHM. [facility] Pager [phone]  If 7PM-7AM, please contact night-coverage [email or web address] Password [facility] 06/28/2014, 12:57 PM    LOS: 2 days

## 2014-06-29 ENCOUNTER — Encounter (HOSPITAL_COMMUNITY): Payer: Self-pay

## 2014-06-29 DIAGNOSIS — G454 Transient global amnesia: Secondary | ICD-10-CM | POA: Insufficient documentation

## 2014-06-29 LAB — CBC
HEMATOCRIT: 23.9 % — AB (ref 36.0–46.0)
HEMOGLOBIN: 7.9 g/dL — AB (ref 12.0–15.0)
MCH: 29.6 pg (ref 26.0–34.0)
MCHC: 33.1 g/dL (ref 30.0–36.0)
MCV: 89.5 fL (ref 78.0–100.0)
Platelets: 126 10*3/uL — ABNORMAL LOW (ref 150–400)
RBC: 2.67 MIL/uL — ABNORMAL LOW (ref 3.87–5.11)
RDW: 14 % (ref 11.5–15.5)
WBC: 5.7 10*3/uL (ref 4.0–10.5)

## 2014-06-29 LAB — BASIC METABOLIC PANEL
Anion gap: 3 — ABNORMAL LOW (ref 5–15)
BUN: 10 mg/dL (ref 6–23)
CALCIUM: 7.8 mg/dL — AB (ref 8.4–10.5)
CO2: 25 mmol/L (ref 19–32)
CREATININE: 0.52 mg/dL (ref 0.50–1.10)
Chloride: 113 mmol/L — ABNORMAL HIGH (ref 96–112)
GFR calc non Af Amer: >90 mL/min (ref >90)
Glucose, Bld: 100 mg/dL — ABNORMAL HIGH (ref 70–99)
Potassium: 3.5 mmol/L (ref 3.5–5.1)
Sodium: 141 mmol/L (ref 135–145)

## 2014-06-29 NOTE — Progress Notes (Addendum)
PROGRESS NOTE  [name] HFW:[id number] DOB: 11-Aug-1940 DOA: 06/25/2014 PCP: [name], MD  HPI/Brief narrative 74 year old female patient with history of GERD, hypothyroid, hearing loss status post right cochlear implant and hearing aid, uterine fibroids with prior history of vaginal bleeding, CAD status post stent January 2015, essential hypertension and HLD admitted to the hospital on 06/26/14 with onset of heavy vaginal bleeding since 4 PM the evening prior. She denies abdominal pain. Off note, patient complains of amnesia to events leading from onset of vaginal bleeding to ED and remembers events since. Denies any other strokelike symptoms. GYN following. Vaginal bleeding decreased. GYN recommends stabilizing patient's bleeding and outpatient follow-up with Dr. [name] off GYN/ONC to decide further management and contact Dr. [name] to evaluate should emergent situation arise. Transfused a unit of PRBC 4/11. Monitor additional 24 hours and if vaginal bleeding continues to decrease, hemoglobin remained stable and not symptomatic/orthostatic, possible discharge 4/13 Assessment/Plan:  Vaginal bleeding - GYN consultation by Dr. [name] appreciated. - As per recommendations, need to evaluate ovary and possible ascites - Abdominal and pelvic CT results as below: 6.8 cm cystic abnormality seen in left adnexal region and abnormal endometrial thickening and postmenopausal woman concerning for malignancy. - Started on medroxyprogesterone/Provera 10 MG twice a day - Aspirin and Plavix held (over 1 year out from her recent coronary stent in January 2015) - We'll need definitive management for this problem given significant blood loss since admission (hemoglobin has dropped from 13.8 on admission to 8.4, patient is orthostatic and symptomatic); however, part of drop is dilution due to IVF - GYN follow-up by Dr. [name] 4/11 appreciated: Continue progesterone, consider adding estrogen  if bleeding increases, follow CA 125 results and outpatient follow-up with GYN/ONC as indicated above. GYN will follow along until stable. - Vaginal bleeding had transiently increased 4/11 but according to patient has significantly improved since.  -pt has appt with Dr. 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 - No focal deficits. - CT head without acute findings. - Unclear etiology.  - Discussed with Neuro Hospitalist 06/26/14: suggests Transient Global Amnesia. Get EEG and if normal no other work up. - Patient has no further episodes of amnesia since admission. - EEG normal. No further similar episodes  Deconditioning -PT eval for HH needs   Near syncope - Occurred 4/10 afternoon while ambulating to bathroom - Secondary to acute blood loss and orthostatic changes  - check orthostatic blood pressures -were significantly positive but improving - IV fluids -->saline lock - Transfuse PRBCs as needed - Up with assistance and PT eval  Orthostatic hypotension - Secondary to acute blood loss - IV fluids,  PRBC as needed - Improved today.  - 06/29/14--saline lock IVF and PT eval for [facility] needs   Acute post-hemorrhagic anemia - Secondary to vaginal bleeding  - Monitor CBCs closely and transfuse if hemoglobin less than 8 g per DL  - Hemoglobin has dropped from 13.8 on 4/9 to 8 on 4/11 - Monitor CBCs closely and transfuse if hemoglobin drops below 8 g per DL - We will need definitive treatment of underlying cause. - S/P 1 unit PRBC on 4/11. Hemoglobin improved to 8.6 g per DL - drop in Hgb from 4/11 due to dilution and pt  was give fluids for orthostasis  Mild thrombocytopenia - check B12 - INR and PTT are WNL - Follow CBCs closely  - Stable   GERD - Continue PPI     Essential hypertension, benign - Controlled. - Continue metoprolol home dose   CAD (coronary artery disease) - Antiplatelets held secondary to significant vaginal bleeding (Informed Primary Cardiologist 4/11 who agreed). - continue metoprolol and statins   Hypothyroidism - Continue levothyroxine    Hyperlipidemia - Continue statins    Code Status: Full Family Communication: Discussed with sister Ms. Allen Norris 06/29/14. Disposition Plan: DC home possibly in the next 24 hours if medically stable         Procedures/Studies: Ct Abdomen Pelvis Wo Contrast  06/26/2014   CLINICAL DATA:  Heavy vaginal bleeding.  EXAM: CT ABDOMEN AND PELVIS WITHOUT CONTRAST  TECHNIQUE: Multidetector CT imaging of the abdomen and pelvis was performed following the standard protocol without IV contrast.  COMPARISON:  None.  FINDINGS: Multilevel degenerative disc disease is noted in the lumbar spine. Visualized lung bases appear normal.  Status post cholecystectomy. No focal abnormality is seen in the liver, spleen or pancreas on these unenhanced images. Adrenal glands appear normal. No hydronephrosis or renal obstruction is noted. No renal or ureteral calculi are noted. Small fat containing periumbilical hernia is noted. The appendix appears normal. There is no evidence of bowel obstruction. Urinary bladder appear normal. Significant endometrial thickening is noted in the uterus which is abnormal for postmenopausal patient. 6.8 cm cyst is noted in the left adnexal region. Large amount of stool is noted in the distal sigmoid colon and rectum concerning for impaction. No significant adenopathy is noted.  IMPRESSION: 6.8 cm cystic abnormality seen in left adnexal region; also noted is probable significant endometrial thickening which is abnormal for a postmenopausal patient. This is concerning for possible endometrial neoplasm or malignancy, and pelvic ultrasound is recommended for further evaluation.  Small fat  containing periumbilical hernia.  Large amount of stool seen in distal sigmoid colon and rectum concerning for impaction.   Electronically Signed   By: Marijo Conception, M.D.   On: 06/26/2014 19:40   Dg Chest 2 View  06/26/2014   CLINICAL DATA:  Elevated lactic acid.  Confusion.  EXAM: CHEST  2 VIEW  COMPARISON:  04/08/2013  FINDINGS: Normal heart size and pulmonary vascularity. No focal airspace disease or consolidation in the lungs. No blunting of costophrenic angles. No pneumothorax. Mediastinal contours appear intact. Degenerative changes in the spine.  IMPRESSION: No active cardiopulmonary disease.   Electronically Signed   By: Lucienne Capers M.D.   On: 06/26/2014 00:06   Ct Head Wo Contrast  06/25/2014   CLINICAL DATA:  Dizziness and confusion. Short-term memory loss. Patient is deaf with implant.  EXAM: CT HEAD WITHOUT CONTRAST  TECHNIQUE: Contiguous axial images were obtained from the base of the skull through the vertex without intravenous contrast.  COMPARISON:  CT temporal bones 12/22/2007  FINDINGS: Implant in the right temporal bone. Streak artifact from the implant limits visualization of some areas. Ventricles and sulci appear symmetrical. No mass effect or midline shift. No abnormal extra-axial fluid collections. Gray-white matter junctions are distinct. Basal cisterns are not effaced. No evidence of acute intracranial hemorrhage. No depressed skull fractures. Visualized paranasal sinuses and mastoid air cells are not opacified.  IMPRESSION: No acute intracranial abnormalities.  Right temporal implant.   Electronically Signed   By: Lucienne Capers M.D.   On: 06/25/2014 23:20   US Transvaginal  Non-ob  06/26/2014   CLINICAL DATA:  Vaginal bleeding  EXAM: TRANSABDOMINAL AND TRANSVAGINAL ULTRASOUND OF PELVIS  TECHNIQUE: Both transabdominal and transvaginal ultrasound examinations of the pelvis were performed. Transabdominal technique was performed for global imaging of the pelvis including  uterus, ovaries, adnexal regions, and pelvic cul-de-sac. It was necessary to proceed with endovaginal exam following the transabdominal exam to visualize the ovaries.  COMPARISON:  None  FINDINGS: Uterus  Measurements: 7.5 x 3.6 x 5.5 cm. Within the uterine fundus there is a fibroid measuring 3.0 x 2.9 x 3.0 cm.  Endometrium  Thickness: 2 mm. A small amount of fluid noted within the endometrial cavity.  Right ovary  Measurements: Not visualize. No adnexal mass.  Left ovary  Measurements: 7.5 x 6.0 x 6.1 cm. Large anechoic structure within the left ovary measures 6.1 x 5.0 x 5.3 cm.  Other findings  Trace fluid identified near the left ovary.  IMPRESSION: 1. Large fundal fibroid. 2. Left ovary cyst. This is almost certainly benign, but follow up ultrasound is recommended in 1 year according to the Society of Radiologists in [place] Statement (D Clovis Riley et al. Management of Asymptomatic Ovarian and Other Adnexal Cysts Imaged at [place]: Society of Radiologists in [place] Statement 2010. Radiology 256 (Sept 2010): 315-400.).   Electronically Signed   By: Kerby Moors M.D.   On: 06/26/2014 14:50   US Pelvis Complete  06/26/2014   CLINICAL DATA:  Vaginal bleeding  EXAM: TRANSABDOMINAL AND TRANSVAGINAL ULTRASOUND OF PELVIS  TECHNIQUE: Both transabdominal and transvaginal ultrasound examinations of the pelvis were performed. Transabdominal technique was performed for global imaging of the pelvis including uterus, ovaries, adnexal regions, and pelvic cul-de-sac. It was necessary to proceed with endovaginal exam following the transabdominal exam to visualize the ovaries.  COMPARISON:  None  FINDINGS: Uterus  Measurements: 7.5 x 3.6 x 5.5 cm. Within the uterine fundus there is a fibroid measuring 3.0 x 2.9 x 3.0 cm.  Endometrium  Thickness: 2 mm. A small amount of fluid noted within the endometrial cavity.  Right ovary  Measurements: Not visualize. No adnexal mass.  Left  ovary  Measurements: 7.5 x 6.0 x 6.1 cm. Large anechoic structure within the left ovary measures 6.1 x 5.0 x 5.3 cm.  Other findings  Trace fluid identified near the left ovary.  IMPRESSION: 1. Large fundal fibroid. 2. Left ovary cyst. This is almost certainly benign, but follow up ultrasound is recommended in 1 year according to the Society of Radiologists in [place] Statement (D Clovis Riley et al. Management of Asymptomatic Ovarian and Other Adnexal Cysts Imaged at [place]: Society of Radiologists in [place] Statement 2010. Radiology 256 (Sept 2010): 867-619.).   Electronically Signed   By: Kerby Moors M.D.   On: 06/26/2014 14:50         Subjective: Patient denies fevers, chills, headache, chest pain, dyspnea, nausea, vomiting, diarrhea, abdominal pain, dysuria, hematuria. The patient still feels generally weak, but denies any further near syncopal episodes. She has less vaginal clots and bleeding.   Objective: Filed Vitals:   06/28/14 0518 06/28/14 1416 06/28/14 2138 06/29/14 0500  BP: 115/41 117/41 107/44 114/40  Pulse: 78 66 65 64  Temp: 99.8 F (37.7 C) 98.7 F (37.1 C) 99 F (37.2 C) 98.5 F (36.9 C)  TempSrc: Oral Oral Oral Oral  Resp: 18 18 18 20   Height:      Weight:      SpO2: 100% 100% 100% 100%  Intake/Output Summary (Last 24 hours) at 06/29/14 0947 Last data filed at 06/29/14 0936  Gross per 24 hour  Intake 1932.5 ml  Output   1500 ml  Net  432.5 ml   Weight change:  Exam:   General:  Pt is alert, follows commands appropriately, not in acute distress  HEENT: No icterus, No thrush,  [place]/AT  Cardiovascular: RRR, S1/S2, no rubs, no gallops  Respiratory: CTA bilaterally, no wheezing, no crackles, no rhonchi  Abdomen: Soft/+BS, non tender, non distended, no guarding  Extremities: No edema, No lymphangitis, No petechiae, No rashes, no synovitis  Data Reviewed: Basic Metabolic Panel:  Recent Labs Lab  06/25/14 2030 06/27/14 0521 06/29/14 0540  NA 136 142 141  K 3.6 3.8 3.5  CL 101 114* 113*  CO2 25 24 25   GLUCOSE 125* 86 100*  BUN 19 12 10   CREATININE 0.81 0.56 0.52  CALCIUM 9.2 8.0* 7.8*   Liver Function Tests:  Recent Labs Lab 06/25/14 2030  AST 26  ALT 17  ALKPHOS 67  BILITOT 0.4  PROT 6.8  ALBUMIN 4.0   No results for input(s): LIPASE, AMYLASE in the last 168 hours. No results for input(s): AMMONIA in the last 168 hours. CBC:  Recent Labs Lab 06/25/14 2030 06/26/14 0056  06/27/14 1244 06/28/14 0031 06/28/14 0510 06/28/14 1236 06/28/14 2100 06/29/14 0540  WBC 9.6 10.3  < > 10.7*  --  6.2 6.1 6.8 5.7  NEUTROABS 7.1 8.6*  --   --   --   --   --   --   --   HGB 13.8 12.8  < > 8.0* 8.7* 8.5* 8.6* 8.2* 7.9*  HCT 41.8 38.6  < > 24.2* 26.1* 25.9* 25.8* 25.1* 23.9*  MCV 90.5 88.7  < > 91.7  --  89.6 88.7 90.3 89.5  PLT 189 169  < > 128*  --  123* 122* 143* 126*  < > = values in this interval not displayed. Cardiac Enzymes: No results for input(s): CKTOTAL, CKMB, CKMBINDEX, TROPONINI in the last 168 hours. BNP: Invalid input(s): POCBNP CBG:  Recent Labs Lab 06/26/14 1856  GLUCAP 104*    Recent Results (from the past 240 hour(s))  Wet prep, genital     Status: Abnormal   Collection Time: 06/25/14  8:24 PM  Result Value Ref Range Status   Yeast Wet Prep HPF POC NONE SEEN NONE SEEN Final   Trich, Wet Prep NONE SEEN NONE SEEN Final   Clue Cells Wet Prep HPF POC NONE SEEN NONE SEEN Final   WBC, Wet Prep HPF POC RARE (A) NONE SEEN Final  Urine culture     Status: None   Collection Time: 06/27/14 10:29 AM  Result Value Ref Range Status   Specimen Description URINE, CLEAN CATCH  Final   Special Requests NONE  Final   Colony Count   Final    >=100,000 COLONIES/ML Performed at [facility]    Culture   Final    Multiple bacterial morphotypes present, none predominant. Suggest appropriate recollection if clinically indicated. Performed at [facility]    Report Status 06/28/2014 FINAL  Final     Scheduled Meds: . atorvastatin  80 mg Oral q1800  . levothyroxine  100 mcg Oral QAC breakfast  . medroxyPROGESTERone  10 mg Oral BID  . metoprolol succinate  25 mg Oral Daily  . pantoprazole  40 mg Oral Daily  . sodium chloride  3 mL Intravenous Q12H   Continuous Infusions:  Donley Harland, DO  [facility] Pager [phone]  If 7PM-7AM, please contact night-coverage [email or web address] Password [facility] 06/29/2014, 9:47 AM   LOS: 3 days

## 2014-06-29 NOTE — Evaluation (Signed)
Physical Therapy Evaluation Patient Details Name: [name] MRN: [id number] DOB: Jul 03, 1940 Today's Date: 06/29/2014   History of Present Illness  74 year old female patient  admitted to the hospital on 06/26/14 with onset of heavy vaginal bleeding;   PMhx: GERD, hypothyroid, hearing loss status post right cochlear implant and hearing aid, uterine fibroids, CAD status post stent January 2015, essential hypertension, HLD   Clinical Impression  Pt will benefit from PT to address deficits below; Pt was unsteady initially without use of RW, balance and safety improved with use of RW; will continue to follow;     Follow Up Recommendations Home health PT    Equipment Recommendations  Rolling walker with 5" wheels    Recommendations for Other Services       Precautions / Restrictions Precautions Precautions: Fall Precaution Comments: near syncope this visit, decr Hgb      Mobility  Bed Mobility Overal bed mobility: Needs Assistance Bed Mobility: Supine to Sit     Supine to sit: Supervision     General bed mobility comments: for safety d/t hx syncope  Transfers Overall transfer level: Needs assistance Equipment used: Rolling walker (2 wheeled);None Transfers: Sit to/from [employer] to Stand: Min guard;Supervision Stand pivot transfers: Supervision;Min guard       General transfer comment: close guarding d/t hx of syncope; encouraged incr time, cues for hand placement  Ambulation/Gait Ambulation/Gait assistance: Supervision Ambulation Distance (Feet): 200 Feet Assistive device: Rolling walker (2 wheeled) Gait Pattern/deviations: Step-through pattern;Decreased stride length     General Gait Details:  cues for  RW safety; pt unsteady without use of RW, attempting to hold onto rail in hallway and with LOB x 2 with min to recover  Stairs            Wheelchair Mobility    Modified Rankin (Stroke Patients Only)       Balance Overall  balance assessment: Needs assistance         Standing balance support: Bilateral upper extremity supported;No upper extremity supported;During functional activity Standing balance-Leahy Scale: Fair                               Pertinent Vitals/Pain Pain Assessment: 0-10 Pain Score: 2  Pain Location: knee  Pain Descriptors / Indicators: Sore Pain Intervention(s): Limited activity within patient's tolerance;Monitored during session;Repositioned    Home Living Family/patient expects to be discharged to:: Private residence Living Arrangements: Alone   Type of Home: House       Home Layout: Two level;Able to live on main level with bedroom/bathroom Home Equipment: Cane - quad Additional Comments: recently aquired quad cane    Prior Function Level of Independence: Independent               Hand Dominance        Extremity/Trunk Assessment   Upper Extremity Assessment: Overall WFL for tasks assessed           Lower Extremity Assessment: Generalized weakness         Communication   Communication: No difficulties  Cognition Arousal/Alertness: Awake/alert Behavior During Therapy: WFL for tasks assessed/performed Overall Cognitive Status: Within Functional Limits for tasks assessed                      General Comments      Exercises        Assessment/Plan    PT Assessment Patient needs continued  PT services  PT Diagnosis Difficulty walking   PT Problem List Decreased strength;Decreased mobility;Decreased activity tolerance;Decreased balance;Decreased knowledge of use of DME  PT Treatment Interventions DME instruction;Gait training;Functional mobility training;Therapeutic activities;Patient/family education;Therapeutic exercise   PT Goals (Current goals can be found in the Care Plan section) Acute Rehab PT Goals Patient Stated Goal: I  PT Goal Formulation: With patient Time For Goal Achievement: 07/06/14 Potential to Achieve  Goals: Good    Frequency Min 3X/week   Barriers to discharge        Co-evaluation               End of Session Equipment Utilized During Treatment: Gait belt Activity Tolerance: Patient tolerated treatment well Patient left: in chair;with call bell/phone within reach;with family/visitor present Nurse Communication: Mobility status         Time: 1840-3754 PT Time Calculation (min) (ACUTE ONLY): 27 min   Charges:   PT Evaluation $Initial PT Evaluation Tier I: 1 Procedure PT Treatments $Gait Training: 8-22 mins   PT G Codes:        [name] 07/26/2014, 1:49 PM

## 2014-06-30 LAB — CBC
HEMATOCRIT: 23.5 % — AB (ref 36.0–46.0)
HEMOGLOBIN: 7.9 g/dL — AB (ref 12.0–15.0)
MCH: 30.2 pg (ref 26.0–34.0)
MCHC: 33.6 g/dL (ref 30.0–36.0)
MCV: 89.7 fL (ref 78.0–100.0)
Platelets: 137 10*3/uL — ABNORMAL LOW (ref 150–400)
RBC: 2.62 MIL/uL — ABNORMAL LOW (ref 3.87–5.11)
RDW: 13.9 % (ref 11.5–15.5)
WBC: 6.4 10*3/uL (ref 4.0–10.5)

## 2014-06-30 LAB — VITAMIN B12: VITAMIN B 12: 259 pg/mL (ref 211–911)

## 2014-06-30 MED ORDER — MEDROXYPROGESTERONE ACETATE 10 MG PO TABS: 10.0000 mg | ORAL_TABLET | Freq: Two times a day (BID) | ORAL | Status: DC

## 2014-06-30 NOTE — Progress Notes (Signed)
CARE MANAGEMENT NOTE 06/30/2014  Patient:  [name], [name]   Account Number:  [id number]  Date Initiated:  06/30/2014  Documentation initiated by:  Edwyna Shell  Subjective/Objective Assessment:   74 yo female admitted withvaginal bleeding from home     Action/Plan:   discharge planning   Anticipated DC Date:  06/30/2014   Anticipated DC Plan:  [place]  CM consult      [place]   Choice offered to / List presented to:  C-1 Patient   DME arranged  [place]      DME agency  [place] arranged  [place].   Status of service:  Completed, signed off Medicare Important Message given?  YES (If response is "NO", the following Medicare IM given date fields will be blank) Date Medicare IM given:  06/30/2014 Medicare IM given by:  Edwyna Shell Date Additional Medicare IM given:   Additional Medicare IM given by:    Discharge Disposition:  [place]  Per UR Regulation:  Reviewed for med. necessity/level of care/duration of stay  If discussed at [place] of Stay Meetings, dates discussed:    Comments:  06/30/14 Edwyna Shell RN BSN CM [phone] Patient stated that she would like [facility] to provide [facility] services. This CM communicated referral to [facility] with [facility]. Also contacted Lecretia with [facility] DME for delivery of rolling walker and shower chair.

## 2014-06-30 NOTE — Discharge Summary (Signed)
Physician Discharge Summary  [name] NUU:[id number] DOB: 03-13-1941 DOA: 06/25/2014  PCP: Kimberly Lyons, MD  Admit date: 06/25/2014 Discharge date: 06/30/2014  Recommendations for Outpatient Follow-up:  1. Pt will need to follow up with PCP in 1-2 weeks post discharge 2. Please obtain CBC in 1 week 3. Follow up Dr. Everitt Kimberly, 07/04/14 @0945  Discharge Diagnoses:   Vaginal bleeding - GYN consultation by Dr. [name] appreciated. - As per recommendations, need to evaluate ovary and possible ascites - Abdominal and pelvic CT results as below: 6.8 cm cystic abnormality seen in left adnexal region and abnormal endometrial thickening and postmenopausal woman concerning for malignancy. - Started on medroxyprogesterone/Provera 10 MG twice a day - Aspirin and Plavix held (over 1 year out from her recent coronary stent in January 2015) - We'll need definitive management for this problem given significant blood loss since admission (hemoglobin has dropped from 13.8 on admission to 8.4, patient is orthostatic and symptomatic); however, part of drop is dilution due to IVF - GYN follow-up by Dr. Linda [name] 4/11 appreciated: Continue progesterone, consider adding estrogen if bleeding increases, follow CA 125 results and outpatient follow-up with GYN/ONC as indicated above. GYN will follow along until stable. - Vaginal bleeding had transiently increased 4/11 but according to patient has significantly improved since.  -Patient did not have any further vaginal bleeding for 24 hours prior to discharge. -pt has appt with Dr. Everitt Kimberly on 07/04/14 @ 945AM at [facility]--continue Provera until appointment   Retrograde amnesia/possible TGA - Patient has amnesia regarding when vaginal bleed started, calling EMS, opening door for them, transport to the hospital, vaginal exam in the ED. - No focal deficits. - CT head without acute findings. - Unclear etiology.  - Discussed with Neuro  Hospitalist 06/26/14: suggests Transient Global Amnesia. Get EEG and if normal no other work up. - Patient has no further episodes of amnesia since admission. - EEG normal. No further similar episodes  Deconditioning -PT eval for [facility] needs-->HHPT with DME has been set up with assistance of care management   Near syncope - Occurred 4/10 afternoon while ambulating to bathroom - Secondary to acute blood loss and orthostatic changes  - check orthostatic blood pressures -were positive but improving -06/30/2014 for repeat orthostatic vital signs negative - IV fluids -->saline lock - Transfuse PRBCs as needed - Up with assistance and PT eval  Orthostatic hypotension - Secondary to acute blood loss - IV fluids, PRBC as needed - 06/29/14--saline lock IVF and PT eval for HH needs -06/30/2014 for repeat orthostatic vital signs negative  Acute post-hemorrhagic anemia - Secondary to vaginal bleeding  - Monitor CBCs closely and transfuse if hemoglobin less than 8 g per DL  - Hemoglobin has dropped from 13.8 on 4/9 to 8 on 4/11 - Monitor CBCs closely and transfuse if hemoglobin drops below 8 g per DL - We will need definitive treatment of underlying cause. - S/P 1 unit PRBC on 4/11. Hemoglobin improved to 8.6 g per DL - drop in Hgb from 4/11 due to dilution and pt was give fluids for orthostasis -After fluids were discontinued, the patient's hemoglobin remained clinically stable.  Mild thrombocytopenia - check B12--pending at the time of discharge - INR and PTT are WNL - Follow CBCs closely  - Stable -Aspirin and Plavix have been discontinued   GERD - Continue PPI    Essential hypertension, benign - Controlled. - Continue metoprolol home dose   CAD (coronary artery disease) - Antiplatelets held secondary  to significant vaginal bleeding (Informed Primary Cardiologist 4/11 who agreed). - continue metoprolol and statins   Hypothyroidism - Continue levothyroxine     Hyperlipidemia - Continue statins   Discharge Condition: stable  Disposition: home Follow-up Information    Follow up with [name], MD.   Specialty:  Obstetrics and Gynecology   Why:  On monday   Contact information:   [place] [address] [phone]       Follow up with Kimberly Eva, MD On 07/04/2014.   Specialty:  Obstetrics and Gynecology   Why:  at 9:45am at [facility].  You will have a pelvic examination as part of your visit.   Contact information:   [address] [place] [address] [phone]       Diet:heart healthy Wt Readings from Last 3 Encounters:  06/26/14 92.6 kg (204 lb 2.3 oz)  03/03/14 92.262 kg (203 lb 6.4 oz)  12/17/13 92.987 kg (205 lb)    History of present illness:  74 year old female patient with history of GERD, hypothyroid, hearing loss status post right cochlear implant and hearing aid, uterine fibroids with prior history of vaginal bleeding, CAD status post stent January 2015, essential hypertension and HLD admitted to the hospital on 06/26/14 with onset of heavy vaginal bleeding since 4 PM the evening prior. She denies abdominal pain. Off note, patient complains of amnesia to events leading from onset of vaginal bleeding to ED and remembers events since. Denies any other strokelike symptoms. GYN following. Vaginal bleeding decreased. GYN recommends stabilizing patient's bleeding and outpatient follow-up with Dr. [name] off GYN/ONC to decide further management and contact Dr. [name] to evaluate should emergent situation arise. Transfused a unit of PRBC 4/11. Hemoglobin remained stable after transfusion. The patient's vaginal bleeding subsided.     Consultants: GYN  Discharge Exam: Filed Vitals:   06/30/14 0700  BP: 115/35  Pulse: 65  Temp: 98.2 F (36.8 C)  Resp: 18   Filed Vitals:   06/29/14 1700 06/29/14 2037 06/30/14 0449 06/30/14 0700  BP: 121/49 124/43 113/50 115/35  Pulse:  74 70 65 65  Temp: 98.6 F (37 C) 98.8 F (37.1 C) 98.6 F (37 C) 98.2 F (36.8 C)  TempSrc: Oral Oral Oral Oral  Resp: 18 18 20 18   Height:      Weight:      SpO2: 98% 100% 100% 100%   General: A&O x 3, NAD, pleasant, cooperative Cardiovascular: RRR, no rub, no gallop, no S3 Respiratory: CTAB, no wheeze, no rhonchi Abdomen:soft, nontender, nondistended, positive bowel sounds Extremities: No edema, No lymphangitis, no petechiae  Discharge Instructions      Discharge Instructions    Diet - low sodium heart healthy    Complete by:  As directed      Increase activity slowly    Complete by:  As directed             Medication List    STOP taking these medications        aspirin 81 MG tablet     clopidogrel 75 MG tablet  Commonly known as:  PLAVIX     nitroGLYCERIN 0.4 MG SL tablet  Commonly known as:  NITROSTAT      TAKE these medications        atorvastatin 80 MG tablet  Commonly known as:  LIPITOR  TAKE 1 TABLET IN THE EVENING AT 6PM.     Calcium + D3 600-200 MG-UNIT Tabs  Take 1 tablet by mouth daily.     FUROSEMIDE PO  Take 20 mg by mouth daily as needed (weight pain). unknown     levothyroxine 100 MCG tablet  Commonly known as:  SYNTHROID, LEVOTHROID  Take 100 mcg by mouth daily before breakfast.     medroxyPROGESTERone 10 MG tablet  Commonly known as:  PROVERA  Take 1 tablet (10 mg total) by mouth 2 (two) times daily.     metoprolol succinate 25 MG 24 hr tablet  Commonly known as:  TOPROL-XL  TAKE 1 TABLET DAILY.     metroNIDAZOLE 0.75 % gel  Commonly known as:  METROGEL  Apply 1 application topically daily. Patient places on her cheeks and nose, only about once or twice a week depending on the climate.     omega-3 acid ethyl esters 1 G capsule  Commonly known as:  LOVAZA  Take 1 g by mouth daily.     pantoprazole 40 MG tablet  Commonly known as:  PROTONIX  Take 1 tablet (40 mg total) by mouth daily.     potassium chloride SA 20 MEQ  tablet  Commonly known as:  K-DUR,KLOR-CON  Take 20 mEq by mouth daily as needed (potassium).     VOLTAREN EX  Apply 1 application topically 2 (two) times daily. Apply to both knees.         The results of significant diagnostics from this hospitalization (including imaging, microbiology, ancillary and laboratory) are listed below for reference.    Significant Diagnostic Studies: Ct Abdomen Pelvis Wo Contrast  06/26/2014   CLINICAL DATA:  Heavy vaginal bleeding.  EXAM: CT ABDOMEN AND PELVIS WITHOUT CONTRAST  TECHNIQUE: Multidetector CT imaging of the abdomen and pelvis was performed following the standard protocol without IV contrast.  COMPARISON:  None.  FINDINGS: Multilevel degenerative disc disease is noted in the lumbar spine. Visualized lung bases appear normal.  Status post cholecystectomy. No focal abnormality is seen in the liver, spleen or pancreas on these unenhanced images. Adrenal glands appear normal. No hydronephrosis or renal obstruction is noted. No renal or ureteral calculi are noted. Small fat containing periumbilical hernia is noted. The appendix appears normal. There is no evidence of bowel obstruction. Urinary bladder appear normal. Significant endometrial thickening is noted in the uterus which is abnormal for postmenopausal patient. 6.8 cm cyst is noted in the left adnexal region. Large amount of stool is noted in the distal sigmoid colon and rectum concerning for impaction. No significant adenopathy is noted.  IMPRESSION: 6.8 cm cystic abnormality seen in left adnexal region; also noted is probable significant endometrial thickening which is abnormal for a postmenopausal patient. This is concerning for possible endometrial neoplasm or malignancy, and pelvic ultrasound is recommended for further evaluation.  Small fat containing periumbilical hernia.  Large amount of stool seen in distal sigmoid colon and rectum concerning for impaction.   Electronically Signed   By: Marijo Conception, M.D.   On: 06/26/2014 19:40   Dg Chest 2 View  06/26/2014   CLINICAL DATA:  Elevated lactic acid.  Confusion.  EXAM: CHEST  2 VIEW  COMPARISON:  04/08/2013  FINDINGS: Normal heart size and pulmonary vascularity. No focal airspace disease or consolidation in the lungs. No blunting of costophrenic angles. No pneumothorax. Mediastinal contours appear intact. Degenerative changes in the spine.  IMPRESSION: No active cardiopulmonary disease.   Electronically Signed   By: Lucienne Capers M.D.   On: 06/26/2014 00:06   Ct Head Wo Contrast  06/25/2014   CLINICAL DATA:  Dizziness and confusion. Short-term memory loss. Patient is deaf with implant.  EXAM: CT HEAD WITHOUT CONTRAST  TECHNIQUE: Contiguous axial images were obtained from the base of the skull through the vertex without intravenous contrast.  COMPARISON:  CT temporal bones 12/22/2007  FINDINGS: Implant in the right temporal bone. Streak artifact from the implant limits visualization of some areas. Ventricles and sulci appear symmetrical. No mass effect or midline shift. No abnormal extra-axial fluid collections. Gray-white matter junctions are distinct. Basal cisterns are not effaced. No evidence of acute intracranial hemorrhage. No depressed skull fractures. Visualized paranasal sinuses and mastoid air cells are not opacified.  IMPRESSION: No acute intracranial abnormalities.  Right temporal implant.   Electronically Signed   By: Lucienne Capers M.D.   On: 06/25/2014 23:20   US Transvaginal Non-ob  06/26/2014   CLINICAL DATA:  Vaginal bleeding  EXAM: TRANSABDOMINAL AND TRANSVAGINAL ULTRASOUND OF PELVIS  TECHNIQUE: Both transabdominal and transvaginal ultrasound examinations of the pelvis were performed. Transabdominal technique was performed for global imaging of the pelvis including uterus, ovaries, adnexal regions, and pelvic cul-de-sac. It was necessary to proceed with endovaginal exam following the transabdominal exam to visualize the  ovaries.  COMPARISON:  None  FINDINGS: Uterus  Measurements: 7.5 x 3.6 x 5.5 cm. Within the uterine fundus there is a fibroid measuring 3.0 x 2.9 x 3.0 cm.  Endometrium  Thickness: 2 mm. A small amount of fluid noted within the endometrial cavity.  Right ovary  Measurements: Not visualize. No adnexal mass.  Left ovary  Measurements: 7.5 x 6.0 x 6.1 cm. Large anechoic structure within the left ovary measures 6.1 x 5.0 x 5.3 cm.  Other findings  Trace fluid identified near the left ovary.  IMPRESSION: 1. Large fundal fibroid. 2. Left ovary cyst. This is almost certainly benign, but follow up ultrasound is recommended in 1 year according to the Society of Radiologists in [place] Statement (D Clovis Riley et al. Management of Asymptomatic Ovarian and Other Adnexal Cysts Imaged at [place]: Society of Radiologists in [place] Statement 2010. Radiology 256 (Sept 2010): 132-440.).   Electronically Signed   By: Kerby Moors M.D.   On: 06/26/2014 14:50   US Pelvis Complete  06/26/2014   CLINICAL DATA:  Vaginal bleeding  EXAM: TRANSABDOMINAL AND TRANSVAGINAL ULTRASOUND OF PELVIS  TECHNIQUE: Both transabdominal and transvaginal ultrasound examinations of the pelvis were performed. Transabdominal technique was performed for global imaging of the pelvis including uterus, ovaries, adnexal regions, and pelvic cul-de-sac. It was necessary to proceed with endovaginal exam following the transabdominal exam to visualize the ovaries.  COMPARISON:  None  FINDINGS: Uterus  Measurements: 7.5 x 3.6 x 5.5 cm. Within the uterine fundus there is a fibroid measuring 3.0 x 2.9 x 3.0 cm.  Endometrium  Thickness: 2 mm. A small amount of fluid noted within the endometrial cavity.  Right ovary  Measurements: Not visualize. No adnexal mass.  Left ovary  Measurements: 7.5 x 6.0 x 6.1 cm. Large anechoic structure within the left ovary measures 6.1 x 5.0 x 5.3 cm.  Other findings  Trace fluid identified near  the left ovary.  IMPRESSION: 1. Large fundal fibroid. 2. Left ovary cyst. This is almost certainly benign, but follow up ultrasound is recommended in 1 year according to the Society of Radiologists in [place] Statement Gordy Levan et al. Management of Asymptomatic Ovarian and Other Adnexal Cysts Imaged at [place]: Society of Radiologists in [place]  Statement 2010. Radiology 256 (Sept 2010): 845-364.).   Electronically Signed   By: Kerby Moors M.D.   On: 06/26/2014 14:50     Microbiology: Recent Results (from the past 240 hour(s))  Wet prep, genital     Status: Abnormal   Collection Time: 06/25/14  8:24 PM  Result Value Ref Range Status   Yeast Wet Prep HPF POC NONE SEEN NONE SEEN Final   Trich, Wet Prep NONE SEEN NONE SEEN Final   Clue Cells Wet Prep HPF POC NONE SEEN NONE SEEN Final   WBC, Wet Prep HPF POC RARE (A) NONE SEEN Final  Urine culture     Status: None   Collection Time: 06/27/14 10:29 AM  Result Value Ref Range Status   Specimen Description URINE, CLEAN CATCH  Final   Special Requests NONE  Final   Colony Count   Final    >=100,000 COLONIES/ML Performed at [facility]    Culture   Final    Multiple bacterial morphotypes present, none predominant. Suggest appropriate recollection if clinically indicated. Performed at [facility]    Report Status 06/28/2014 FINAL  Final     Labs: Basic Metabolic Panel:  Recent Labs Lab 06/25/14 2030 06/27/14 0521 06/29/14 0540  NA 136 142 141  K 3.6 3.8 3.5  CL 101 114* 113*  CO2 25 24 25   GLUCOSE 125* 86 100*  BUN 19 12 10   CREATININE 0.81 0.56 0.52  CALCIUM 9.2 8.0* 7.8*   Liver Function Tests:  Recent Labs Lab 06/25/14 2030  AST 26  ALT 17  ALKPHOS 67  BILITOT 0.4  PROT 6.8  ALBUMIN 4.0   No results for input(s): LIPASE, AMYLASE in the last 168 hours. No results for input(s): AMMONIA in the last 168 hours. CBC:  Recent Labs Lab 06/25/14 2030  06/26/14 0056  06/28/14 0510 06/28/14 1236 06/28/14 2100 06/29/14 0540 06/30/14 0520  WBC 9.6 10.3  < > 6.2 6.1 6.8 5.7 6.4  NEUTROABS 7.1 8.6*  --   --   --   --   --   --   HGB 13.8 12.8  < > 8.5* 8.6* 8.2* 7.9* 7.9*  HCT 41.8 38.6  < > 25.9* 25.8* 25.1* 23.9* 23.5*  MCV 90.5 88.7  < > 89.6 88.7 90.3 89.5 89.7  PLT 189 169  < > 123* 122* 143* 126* 137*  < > = values in this interval not displayed. Cardiac Enzymes: No results for input(s): CKTOTAL, CKMB, CKMBINDEX, TROPONINI in the last 168 hours. BNP: Invalid input(s): POCBNP CBG:  Recent Labs Lab 06/26/14 1856  GLUCAP 104*    Time coordinating discharge:  Greater than 30 minutes  Signed:  Holy Battenfield, DO [facility] Pager: [phone] 06/30/2014, 9:44 AM

## 2014-06-30 NOTE — Progress Notes (Signed)
Patient given discharge instructions, and verbalized an understanding of all discharge instructions.  Patient agrees with discharge plan, and is being discharged in stable medical condition.  Patient given transportation via wheelchair. 

## 2014-07-01 ENCOUNTER — Encounter (HOSPITAL_COMMUNITY): Payer: Self-pay

## 2014-07-03 DIAGNOSIS — E039 Hypothyroidism, unspecified: Secondary | ICD-10-CM | POA: Diagnosis not present

## 2014-07-03 DIAGNOSIS — R412 Retrograde amnesia: Secondary | ICD-10-CM | POA: Diagnosis not present

## 2014-07-03 DIAGNOSIS — I951 Orthostatic hypotension: Secondary | ICD-10-CM | POA: Diagnosis not present

## 2014-07-03 DIAGNOSIS — M25562 Pain in left knee: Secondary | ICD-10-CM | POA: Diagnosis not present

## 2014-07-03 DIAGNOSIS — M1990 Unspecified osteoarthritis, unspecified site: Secondary | ICD-10-CM | POA: Diagnosis not present

## 2014-07-03 DIAGNOSIS — E785 Hyperlipidemia, unspecified: Secondary | ICD-10-CM | POA: Diagnosis not present

## 2014-07-03 DIAGNOSIS — D696 Thrombocytopenia, unspecified: Secondary | ICD-10-CM | POA: Diagnosis not present

## 2014-07-03 DIAGNOSIS — K219 Gastro-esophageal reflux disease without esophagitis: Secondary | ICD-10-CM | POA: Diagnosis not present

## 2014-07-03 DIAGNOSIS — D649 Anemia, unspecified: Secondary | ICD-10-CM | POA: Diagnosis not present

## 2014-07-03 DIAGNOSIS — M25561 Pain in right knee: Secondary | ICD-10-CM | POA: Diagnosis not present

## 2014-07-03 DIAGNOSIS — N939 Abnormal uterine and vaginal bleeding, unspecified: Secondary | ICD-10-CM | POA: Diagnosis not present

## 2014-07-03 DIAGNOSIS — I2510 Atherosclerotic heart disease of native coronary artery without angina pectoris: Secondary | ICD-10-CM | POA: Diagnosis not present

## 2014-07-04 ENCOUNTER — Telehealth: Payer: Self-pay | Admitting: Interventional Cardiology

## 2014-07-04 ENCOUNTER — Encounter: Payer: Self-pay | Admitting: Gynecologic Oncology

## 2014-07-04 ENCOUNTER — Other Ambulatory Visit: Payer: Medicare Other

## 2014-07-04 ENCOUNTER — Telehealth: Payer: Self-pay | Admitting: Nurse Practitioner

## 2014-07-04 ENCOUNTER — Encounter (HOSPITAL_COMMUNITY): Payer: Self-pay

## 2014-07-04 ENCOUNTER — Ambulatory Visit: Payer: Medicare Other | Attending: Gynecologic Oncology | Admitting: Gynecologic Oncology

## 2014-07-04 VITALS — BP 130/42 | HR 70 | Temp 98.0°F | Resp 18 | Ht 66.0 in | Wt 198.8 lb

## 2014-07-04 DIAGNOSIS — N950 Postmenopausal bleeding: Secondary | ICD-10-CM | POA: Diagnosis not present

## 2014-07-04 DIAGNOSIS — N8320 Unspecified ovarian cysts: Secondary | ICD-10-CM | POA: Diagnosis not present

## 2014-07-04 DIAGNOSIS — R1909 Other intra-abdominal and pelvic swelling, mass and lump: Secondary | ICD-10-CM | POA: Diagnosis not present

## 2014-07-04 DIAGNOSIS — R938 Abnormal findings on diagnostic imaging of other specified body structures: Secondary | ICD-10-CM

## 2014-07-04 DIAGNOSIS — N858 Other specified noninflammatory disorders of uterus: Secondary | ICD-10-CM | POA: Diagnosis not present

## 2014-07-04 DIAGNOSIS — N9489 Other specified conditions associated with female genital organs and menstrual cycle: Secondary | ICD-10-CM

## 2014-07-04 MED ORDER — MEDROXYPROGESTERONE ACETATE 10 MG PO TABS: 10.0000 mg | ORAL_TABLET | Freq: Two times a day (BID) | ORAL | Status: DC

## 2014-07-04 NOTE — Addendum Note (Signed)
Addended by: Alla Feeling on: 07/04/2014 03:32 PM   Modules accepted: Orders

## 2014-07-04 NOTE — Progress Notes (Signed)
Consult Note: Gyn-Onc  Consult was requested by Dr. [name] for the evaluation of [name] 74 y.o. female  CC:  Chief Complaint  Patient presents with  . adnexal mass    Assessment/Plan:  [name]  is a 74 y.o.  year old G16 with a cardiac history and postmenopausal bleeding causing intolerance of anti-platelet therapy. She has a and endometrial lining on ultrasound and CT scan. Her last endometrial sampling prior to today was in 2012 and was benign.  I performed a history, physical examination, and personally reviewed the patient's imaging films including the CT abdomen and pelvis from 06/26/14.  I do not believe that her postmenopausal bleeding is secondary to her fibroid. Instead I believe it is secondary to whatever process is causing the thickened endometrial lining. We have sample the endometrium today and will follow up the results from this biopsy. However it even if it reveals benign pathology, in this patient who requires antiplatelet therapy for her significant coronary artery disease history, I'm advocating hysterectomy in order to prevent future episodes of severe vaginal hemorrhage. I am recommending a robotic hysterectomy with BSO, and frozen section, and staging if malignancy is identified in the endometrium or the ovary. Given the appearance on ultrasound and CT findings and its presence over several years I have a low suspicion that the left ovarian cyst is malignant. However I am recommending a CA-125 preoperatively.  I will recheck to her cardiologist Dr.Veranasi operatively and have recommended she see Dr. Lacie Draft in the coming few weeks in order to be optimized for the surgery. If any cardiac interventions are necessary in order to optimize her condition for surgery, it would be safe to delay her surgery to do so.  I discussed the risks of robotic hysterectomy including  bleeding, infection, damage to internal organs (such as bladder,ureters, bowels),  blood clot, reoperation and rehospitalization. We discussed positioning during robotic surgery and risk for nerve injury and compression injury. We discussed anticipated hospital stay (overnight) and anticipated discharge capabilities and postoperative recovery.  HPI: [name] is a 74 year old gravida 0 who is seen in consultation at the request of Dr. [name] for vaginal hemorrhage, postmenopausal bleeding, a left ovarian cyst, and concern for occult malignancy. The patient was admitted to [facility] on 06/26/2014 for vaginal bleeding and dizziness. She wasprofoundly anemic and was transfused. The source of the bleeding was vaginal. She was seen by the gynecology team who recommended transvaginal ultrasound scan. This was performed on 06/26/2014 and revealed a uterus measuring 7.5 x 3.6 x 5.5 cm with a uterine fundal fibroid measuring 3 x 2.9 x 3 cm. The endometrium measured 2 mm. There was an anechoic 7 x 6 x 6 cm left ovarian cyst that appeared grossly benign. She also underwent a CT scan of the abdomen and pelvis which showed "significant endometrial thickening" abnormal in a postmenopausal patient. It again identified the left ovarian cysts but did not find any concerning signs for metastatic ovarian cancer. There was no lymphadenopathy present.  [name] has a several year history of vaginal spotting. In 2010 she underwent a benign endocervical polypectomy with a D&C. She also underwent a D&C in 2012 for submucosal fibroid. She was known to have an ovarian cyst at that time and a determination was made for expectant management given no evidence of malignancy. She does not report repeated sampling of the endometrial cavity since that last procedure.  In June 2015 she experienced an ST elevation MI and underwent  PCI. Her ejection fraction was known to be approximately 55%. She was treated post stenting with antiplatelet therapy but had intolerance to everything other than aspirin and  Plavix. She's been on these 2 agents for a proximally one year prior to last week's episode of severe vaginal bleeding. She last her cardiologist approximate 1 month ago. She is engaged in cardiac rehabilitation. She denies shortness of breath or chest pain on exertion. She is able to walk up a flight of stairs without chest pain or shortness of breath.  In terval History: only vaginal spotting (light) while on Provera.  Patient was placed on provera by gynecologist while an inpatient at [facility]. ASA and plavix were discontinued at that time.  Current Meds:  Outpatient Encounter Prescriptions as of 07/04/2014  Medication Sig  . atorvastatin (LIPITOR) 80 MG tablet TAKE 1 TABLET IN THE EVENING AT 6PM.  . Calcium Carb-Cholecalciferol (CALCIUM + D3) 600-200 MG-UNIT TABS Take 1 tablet by mouth daily.  . Diclofenac Sodium (VOLTAREN EX) Apply 1 application topically 2 (two) times daily. Apply to both knees.  . FUROSEMIDE PO Take 20 mg by mouth daily as needed (weight pain). unknown  . levothyroxine (SYNTHROID, LEVOTHROID) 100 MCG tablet Take 100 mcg by mouth daily before breakfast.  . medroxyPROGESTERone (PROVERA) 10 MG tablet Take 1 tablet (10 mg total) by mouth 2 (two) times daily.  . metoprolol succinate (TOPROL-XL) 25 MG 24 hr tablet TAKE 1 TABLET DAILY.  . metroNIDAZOLE (METROGEL) 0.75 % gel Apply 1 application topically daily. Patient places on her cheeks and nose, only about once or twice a week depending on the climate.  Marland Kitchen omega-3 acid ethyl esters (LOVAZA) 1 G capsule Take 1 g by mouth daily.  . pantoprazole (PROTONIX) 40 MG tablet Take 1 tablet (40 mg total) by mouth daily.  . potassium chloride SA (K-DUR,KLOR-CON) 20 MEQ tablet Take 20 mEq by mouth daily as needed (potassium).   . [DISCONTINUED] medroxyPROGESTERone (PROVERA) 10 MG tablet Take 1 tablet (10 mg total) by mouth 2 (two) times daily.  . clopidogrel (PLAVIX) 75 MG tablet     Allergy:  Allergies  Allergen Reactions  . Bee  Venom Itching and Swelling  . Iohexol Hives    Social Hx:   History   Social History  . Marital Status: Divorced    Spouse Name: N/A  . Number of Children: N/A  . Years of Education: N/A   Occupational History  . 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 Smoking status: Former Smoker -- 0.20 packs/day for 2 years    Types: Cigarettes    Quit date: 03/18/1968  . Smokeless tobacco: Not on file  . Alcohol Use: No  . Drug Use: No  . Sexual Activity: Not on file   Other Topics Concern  . Not on file   Social History Narrative   Lives in [place] by herself.  Sister is nearby and is Healthcare POA.    Past Surgical Hx:  Past Surgical History  Procedure Laterality Date  . Cochlear implant    . Upper gastrointestinal endoscopy    . Cervical polypectomy      2010  . Cholecystectomy    . Uterine polyp      had D and C done to remove the polyp  . Left heart catheterization with coronary angiogram N/A 03/25/2013    Procedure: LEFT HEART CATHETERIZATION WITH CORONARY ANGIOGRAM;  Surgeon: Burnell Blanks, MD;  Location: [facility] CATH LAB;  Service: Cardiovascular;  Laterality: N/A;    Past Medical Hx:  Past Medical History  Diagnosis Date  . IV Contrast Allergy   . GERD (gastroesophageal reflux disease)   . Hypothyroidism   . Rosacea   . Hearing loss     a. s/p cochlear implant on right, hearing aid on left.  . Obesity   . Fibroids     a. uterine - spotting noted since 03/11/2013.  Marland Kitchen CAD (coronary artery disease)     a. 03/2013 Infpost STEMI/PCI: LM nl, LAD min irregs, LCX 100 (3.0x12 Vision BMS), RCA  mild ostial dzs, EF 55%.  . Essential hypertension, benign   . Ventricular tachycardia     a. Immediate post pci/MI - no complications, on BB.  Marland Kitchen Hyperlipidemia   . Myocardial infarction     Past Gynecological History:  G0. No family hx of cancer.  No LMP recorded. Patient is postmenopausal.  Family Hx:  Family History  Problem Relation Age of  Onset  . Esophageal cancer Neg Hx   . Stomach cancer Neg Hx   . Colon cancer Cousin   . Heart failure Mother     died in her 58's.  . Cancer Mother   . Diabetes Mother   . Heart disease Mother   . Hypertension Mother   . Other Father     died in WWII  . Hyperlipidemia Sister   . Diabetes Sister   . Heart attack Mother   . Heart attack Maternal Grandmother   . Stroke Neg Hx     Review of Systems:  Constitutional  Feels well,    ENT Normal appearing ears and nares bilaterally Skin/Breast  No rash, sores, jaundice, itching, dryness Cardiovascular  No chest pain, shortness of breath, or edema  Pulmonary  No cough or wheeze.  Gastro Intestinal  No nausea, vomitting, or diarrhoea. No bright red blood per rectum, no abdominal pain, change in bowel movement, or constipation.  Genito Urinary  No frequency, urgency, dysuria, see HPI Musculo Skeletal  No myalgia, arthralgia, joint swelling or pain  Neurologic  No weakness, numbness, change in gait,  Psychology  No depression, anxiety, insomnia.   Vitals:  Blood pressure 130/42, pulse 70, temperature 98 F (36.7 C), temperature source Oral, resp. rate 18, height 5\' 6"  (1.676 m), weight 198 lb 12.8 oz (90.175 kg).  Physical Exam: WD in NAD Neck  Supple NROM, without any enlargements.  Lymph Node Survey No cervical supraclavicular or inguinal adenopathy Cardiovascular  Pulse normal rate, regularity and rhythm. S1 and S2 normal.  Lungs  Clear to auscultation bilateraly, without wheezes/crackles/rhonchi. Good air movement.  Skin  No rash/lesions/breakdown  Psychiatry  Alert and oriented to person, place, and time  Abdomen  Normoactive bowel sounds, abdomen soft, non-tender and obese with  Soft umbilical unincarcerated hernia.  Back No CVA tenderness Genito Urinary  Vulva/vagina: Normal external female genitalia.  No lesions. No discharge or bleeding.  Bladder/urethra:  No lesions or masses, well supported  bladder  Vagina: smooth and regular, no lesions.  Cervix: Normal appearing, no lesions.  Uterus: Small, mobile, no parametrial involvement or nodularity.  Adnexa: no palpable masses though exam limited by body habitus. Rectal  Good tone, no masses no cul de sac nodularity.  Extremities  No bilateral cyanosis, clubbing or edema.  PROCEDURE: ENDOMETRIAL BIOPSY The patient provided the able consent to an endometrial biopsy. The speculum was placed in the cervix was visualized. The endometrial Pipelle was passed to a fundal height of  7 cm. It was aspirated. 2 passes of the Pipelle was made to collect small amount of endometrial tissue. It was sent for pathology. The patient tolerated procedure well and there was no abnormal bleeding.    [name], MD   07/04/2014, 10:43 AM

## 2014-07-04 NOTE — Telephone Encounter (Signed)
RN called patient to inform her of cardiology apt on 07/29/14 with Richardson Dopp, PA for clearance for surgery. Patient verbalizes understanding of apt date and time and thanks for the call.

## 2014-07-04 NOTE — Patient Instructions (Addendum)
Preparing for your Surgery  Plan for surgery on May 31 with Dr. [name].  Please arrange an appointment to see your cardiologist.  Pre-operative Testing -You will receive a phone call from presurgical testing at [facility] to arrange for a pre-operative testing appointment before your surgery.  This appointment normally occurs one to two weeks before your scheduled surgery.   -Bring your insurance card, copy of an advanced directive if applicable, medication list  -At that visit, you will be asked to sign a consent for a possible blood transfusion in case a transfusion becomes necessary during surgery.  The need for a blood transfusion is rare but having consent is a necessary part of your care.     -You should not be taking blood thinners or aspirin at least ten days prior to surgery unless instructed by your surgeon.  Day Before Surgery at [facility] will be asked to take in only clear liquids the day before surgery.  Examples of clear liquids include broths, jello, and clear juices.  You will need to drink one bottle of magnesium citrate the morning before surgery (May 30).  You will be advised to have nothing to eat or drink after midnight the evening before.    Your role in recovery Your role is to become active as soon as directed by your doctor, while still giving yourself time to heal.  Rest when you feel tired. You will be asked to do the following in order to speed your recovery:  - Cough and breathe deeply. This helps toclear and expand your lungs and can prevent pneumonia. You may be given a spirometer to practice deep breathing. A staff member will show you how to use the spirometer. - Do mild physical activity. Walking or moving your legs help your circulation and body functions return to normal. A staff member will help you when you try to walk and will provide you with simple exercises. Do not try to get up or walk alone the first time. - Actively manage your  pain. Managing your pain lets you move in comfort. We will ask you to rate your pain on a scale of zero to 10. It is your responsibility to tell your doctor or nurse where and how much you hurt so your pain can be treated.  Special Considerations -If you are diabetic, you may be placed on insulin after surgery to have closer control over your blood sugars to promote healing and recovery.  This does not mean that you will be discharged on insulin.  If applicable, your oral antidiabetics will be resumed when you are tolerating a solid diet.  -Your final pathology results from surgery should be available by the Friday after surgery and the results will be relayed to you when available.  Blood Transfusion Information WHAT IS A BLOOD TRANSFUSION? A transfusion is the replacement of blood or some of its parts. Blood is made up of multiple cells which provide different functions.  Red blood cells carry oxygen and are used for blood loss replacement.  White blood cells fight against infection.  Platelets control bleeding.  Plasma helps clot blood.  Other blood products are available for specialized needs, such as hemophilia or other clotting disorders. BEFORE THE TRANSFUSION  Who gives blood for transfusions?   You may be able to donate blood to be used at a later date on yourself (autologous donation).  Relatives can be asked to donate blood. This is generally not any safer than if you  have received blood from a stranger. The same precautions are taken to ensure safety when a relative's blood is donated.  Healthy volunteers who are fully evaluated to make sure their blood is safe. This is blood bank blood. Transfusion therapy is the safest it has ever been in the practice of medicine. Before blood is taken from a donor, a complete history is taken to make sure that person has no history of diseases nor engages in risky social behavior (examples are intravenous drug use or sexual activity with  multiple partners). The donor's travel history is screened to minimize risk of transmitting infections, such as malaria. The donated blood is tested for signs of infectious diseases, such as HIV and hepatitis. The blood is then tested to be sure it is compatible with you in order to minimize the chance of a transfusion reaction. If you or a relative donates blood, this is often done in anticipation of surgery and is not appropriate for emergency situations. It takes many days to process the donated blood. RISKS AND COMPLICATIONS Although transfusion therapy is very safe and saves many lives, the main dangers of transfusion include:   Getting an infectious disease.  Developing a transfusion reaction. This is an allergic reaction to something in the blood you were given. Every precaution is taken to prevent this. The decision to have a blood transfusion has been considered carefully by your caregiver before blood is given. Blood is not given unless the benefits outweigh the risks.

## 2014-07-06 ENCOUNTER — Encounter (HOSPITAL_COMMUNITY): Payer: Self-pay

## 2014-07-06 ENCOUNTER — Telehealth: Payer: Self-pay | Admitting: *Deleted

## 2014-07-06 DIAGNOSIS — M25562 Pain in left knee: Secondary | ICD-10-CM | POA: Diagnosis not present

## 2014-07-06 DIAGNOSIS — N939 Abnormal uterine and vaginal bleeding, unspecified: Secondary | ICD-10-CM | POA: Diagnosis not present

## 2014-07-06 DIAGNOSIS — M25561 Pain in right knee: Secondary | ICD-10-CM | POA: Diagnosis not present

## 2014-07-06 DIAGNOSIS — D696 Thrombocytopenia, unspecified: Secondary | ICD-10-CM | POA: Diagnosis not present

## 2014-07-06 DIAGNOSIS — D649 Anemia, unspecified: Secondary | ICD-10-CM | POA: Diagnosis not present

## 2014-07-06 DIAGNOSIS — R412 Retrograde amnesia: Secondary | ICD-10-CM | POA: Diagnosis not present

## 2014-07-06 NOTE — Telephone Encounter (Signed)
OK to keep current appt.  Let us know if sx get worse.

## 2014-07-06 NOTE — Telephone Encounter (Signed)
Called pt's home and the home health therapist, Eustaquio Maize, answered. Beth states that pt had mentioned that she occasionally has some SOB at rest but it is not often. Therapists states that pt had normal SOB with activity during therapy today. Pt has no other sx at this time. BP this morning 130/80, pulse 66, O2 100%. Therapists wanted to make Dr. Irish Lack aware incase he felt like she needed to be seen sooner. Pt currently has appt with Richardson Dopp, PA on 07/29/14. Will forward to Dr. Irish Lack for review and advisement.

## 2014-07-06 NOTE — Telephone Encounter (Signed)
Left message to call back  

## 2014-07-06 NOTE — Telephone Encounter (Signed)
Called and spoke with patient's sister, Caren Griffins, and notified her that the endometrial biopsy results did not show any cancer or abnormal cells per Joylene John, NP. Patient is HOH and prefers that telephone communication go through her sister who is listed as emergency contact. Caren Griffins agreeable to tell her sister results.

## 2014-07-06 NOTE — Telephone Encounter (Signed)
New message      Pt c/o Shortness Of Breath: STAT if SOB developed within the last 24 hours or pt is noticeably SOB on the phone  1. Are you currently SOB (can you hear that pt is SOB on the phone)?  Talking to home health nurse----she says yes 2. How long have you been experiencing SOB?  Past couple of days 3. Are you SOB when sitting or when up moving around? Increased sob at rest  4. Are you currently experiencing any other symptoms? No other symmtoms----vitals ok  (124/80----HR66)

## 2014-07-06 NOTE — Telephone Encounter (Signed)
Spoke with pt and told her to keep her current appt with Richardson Dopp, PA on 07/29/14 and to let us know if her sx worsen. Pt verbalized understanding and was in agreement with this plan.

## 2014-07-08 ENCOUNTER — Encounter (HOSPITAL_COMMUNITY): Payer: Self-pay

## 2014-07-11 ENCOUNTER — Encounter (HOSPITAL_COMMUNITY): Payer: Self-pay

## 2014-07-12 DIAGNOSIS — D696 Thrombocytopenia, unspecified: Secondary | ICD-10-CM | POA: Diagnosis not present

## 2014-07-12 DIAGNOSIS — N939 Abnormal uterine and vaginal bleeding, unspecified: Secondary | ICD-10-CM | POA: Diagnosis not present

## 2014-07-12 DIAGNOSIS — M25562 Pain in left knee: Secondary | ICD-10-CM | POA: Diagnosis not present

## 2014-07-12 DIAGNOSIS — M25561 Pain in right knee: Secondary | ICD-10-CM | POA: Diagnosis not present

## 2014-07-12 DIAGNOSIS — D649 Anemia, unspecified: Secondary | ICD-10-CM | POA: Diagnosis not present

## 2014-07-12 DIAGNOSIS — R412 Retrograde amnesia: Secondary | ICD-10-CM | POA: Diagnosis not present

## 2014-07-13 ENCOUNTER — Encounter (HOSPITAL_COMMUNITY): Payer: Self-pay

## 2014-07-14 DIAGNOSIS — M25562 Pain in left knee: Secondary | ICD-10-CM | POA: Diagnosis not present

## 2014-07-14 DIAGNOSIS — E039 Hypothyroidism, unspecified: Secondary | ICD-10-CM | POA: Diagnosis not present

## 2014-07-14 DIAGNOSIS — D696 Thrombocytopenia, unspecified: Secondary | ICD-10-CM | POA: Diagnosis not present

## 2014-07-14 DIAGNOSIS — Z1389 Encounter for screening for other disorder: Secondary | ICD-10-CM | POA: Diagnosis not present

## 2014-07-14 DIAGNOSIS — D649 Anemia, unspecified: Secondary | ICD-10-CM | POA: Diagnosis not present

## 2014-07-14 DIAGNOSIS — M25561 Pain in right knee: Secondary | ICD-10-CM | POA: Diagnosis not present

## 2014-07-14 DIAGNOSIS — E785 Hyperlipidemia, unspecified: Secondary | ICD-10-CM | POA: Diagnosis not present

## 2014-07-14 DIAGNOSIS — N939 Abnormal uterine and vaginal bleeding, unspecified: Secondary | ICD-10-CM | POA: Diagnosis not present

## 2014-07-14 DIAGNOSIS — Z6831 Body mass index (BMI) 31.0-31.9, adult: Secondary | ICD-10-CM | POA: Diagnosis not present

## 2014-07-14 DIAGNOSIS — R412 Retrograde amnesia: Secondary | ICD-10-CM | POA: Diagnosis not present

## 2014-07-14 DIAGNOSIS — I10 Essential (primary) hypertension: Secondary | ICD-10-CM | POA: Diagnosis not present

## 2014-07-14 DIAGNOSIS — K219 Gastro-esophageal reflux disease without esophagitis: Secondary | ICD-10-CM | POA: Diagnosis not present

## 2014-07-14 DIAGNOSIS — I213 ST elevation (STEMI) myocardial infarction of unspecified site: Secondary | ICD-10-CM | POA: Diagnosis not present

## 2014-07-14 DIAGNOSIS — R0609 Other forms of dyspnea: Secondary | ICD-10-CM | POA: Diagnosis not present

## 2014-07-15 ENCOUNTER — Encounter (HOSPITAL_COMMUNITY): Payer: Self-pay

## 2014-07-15 DIAGNOSIS — D649 Anemia, unspecified: Secondary | ICD-10-CM | POA: Diagnosis not present

## 2014-07-15 DIAGNOSIS — M25561 Pain in right knee: Secondary | ICD-10-CM | POA: Diagnosis not present

## 2014-07-15 DIAGNOSIS — N939 Abnormal uterine and vaginal bleeding, unspecified: Secondary | ICD-10-CM | POA: Diagnosis not present

## 2014-07-15 DIAGNOSIS — D696 Thrombocytopenia, unspecified: Secondary | ICD-10-CM | POA: Diagnosis not present

## 2014-07-15 DIAGNOSIS — M25562 Pain in left knee: Secondary | ICD-10-CM | POA: Diagnosis not present

## 2014-07-15 DIAGNOSIS — R412 Retrograde amnesia: Secondary | ICD-10-CM | POA: Diagnosis not present

## 2014-07-18 ENCOUNTER — Encounter (HOSPITAL_COMMUNITY): Payer: Self-pay

## 2014-07-20 ENCOUNTER — Encounter (HOSPITAL_COMMUNITY): Payer: Self-pay

## 2014-07-22 ENCOUNTER — Encounter (HOSPITAL_COMMUNITY): Payer: Self-pay

## 2014-07-25 ENCOUNTER — Encounter (HOSPITAL_COMMUNITY): Payer: Self-pay

## 2014-07-27 ENCOUNTER — Encounter (HOSPITAL_COMMUNITY): Payer: Self-pay

## 2014-07-28 NOTE — Progress Notes (Signed)
Cardiology Office Note   Date:  07/29/2014   ID:  [name], DOB [birth year], MRN [id number]  PCP:  Kimberly Lyons, MD  Cardiologist:  Dr. Casandra [name]    Chief Complaint  Patient presents with  . Coronary Artery Disease    Surgical clearance     History of Present Illness: [name] is a 74 y.o. female with a hx of CAD status post inferoposterior STEMI 03/2013 treated with a BMS to the left circumflex, HL, HTN, IV dye allergy. Post PCI, she had recurrent nonsustained ventricular tachycardia. Relook cardiac catheterization demonstrated patent circumflex stent. Ejection fraction has been well-preserved. Last seen by Dr. Irish [name] 03/03/14. She returns today for preoperative cardiac evaluation prior to GYN surgery.  The patient was admitted to the hospital in 06/2014 with acute blood loss anemia from profound vaginal bleeding. Hemoglobin dropped to 7.9. She was transfused with 1 unit of packed red blood cells. Antiplatelet agents were stopped. She has seen Dr. Denman [name]. The patient needs abdominal hysterectomy performed. She returns for surgical clearance. The patient denies chest pain. She describes dyspnea with exertion since her myocardial infarction. Symptoms of been stable without significant change. Originally, she was on Brilinta and then Effient. Since going to Plavix, her dyspnea has improved. She sleeps on 2 pillows chronically. She denies PND or significant edema. She denies any further syncope since going to the hospital with her anemia. Bleeding has improved. She remains off of aspirin and Plavix at this time. Apparently, her orthopedist told her that the swelling in her legs was concerning for CHF. Her edema clearly improves with leg elevation and worsens with standing.    Studies/Reports Reviewed Today:  LHC 03/25/13 Left main: Normal caliber, short segment. No obstructive disease.  Left Anterior Descending Artery: Mild luminal irregularities in the mid vessel.    Circumflex Artery: Proximal stent is patent  Right Coronary Artery: 30% mid stenosis. The PDA and posterolateral branches are small in caliber.            LHC/PCI 03/24/13 The left main coronary artery is short, but widely patent. LAD: mild irregularities. There are several small to medium-sized diagonal vessels which are widely patent.   LCx: proximal vessel was occluded.  RCA:  mild ostial disease. The posterior descending vessel is small but patent.  EF: 55% LVEDP 28 PCI: 3 x 12 mm vision BMS to the proximal circumflex  Echo 06/18/13 - Mild focal basal hypertrophy of the septum. EF 50% to 55%. Wall motion was normal;  - Left atrium: The atrium was moderately dilated. - Pulmonary arteries: PA peak pressure: 72mm Hg (S).   Past Medical History  Diagnosis Date  . IV Contrast Allergy   . GERD (gastroesophageal reflux disease)   . Hypothyroidism   . Rosacea   . Hearing loss     a. s/p cochlear implant on right, hearing aid on left.  . Obesity   . Fibroids     a. uterine - spotting noted since 03/11/2013.  Marland Kitchen CAD (coronary artery disease)     a. 03/2013 Infpost STEMI/PCI: LM nl, LAD min irregs, LCX 100 (3.0x12 Vision BMS), RCA  mild ostial dzs, EF 55%.  . Essential hypertension, benign   . Ventricular tachycardia     a. Immediate post pci/MI - no complications, on BB.  Marland Kitchen Hyperlipidemia   . Myocardial infarction     Past Surgical History  Procedure Laterality Date  . Cochlear implant    . Upper gastrointestinal endoscopy    .  Cervical polypectomy      2010  . Cholecystectomy    . Uterine polyp      had D and C done to remove the polyp  . Left heart catheterization with coronary angiogram N/A 03/25/2013    Procedure: LEFT HEART CATHETERIZATION WITH CORONARY ANGIOGRAM;  Surgeon: Burnell Blanks, MD;  Location: [facility] C[facility] LAB;  Service: Cardiovascular;  Laterality: N/A;     Current Outpatient Prescriptions  Medication Sig Dispense Refill  . atorvastatin (LIPITOR) 80 MG  tablet Take 40 mg by mouth daily at 6 PM. Patient taking 40 mg by mouth every evening    . Calcium Carb-Cholecalciferol (CALCIUM + D3) 600-200 MG-UNIT TABS Take 1 tablet by mouth daily.    . Diclofenac Sodium (VOLTAREN EX) Apply 1 application topically 2 (two) times daily. Apply to both knees.    . FUROSEMIDE PO Take 20 mg by mouth daily as needed (weight pain). unknown    . iron polysaccharides (NIFEREX) 150 MG capsule Take 150 mg by mouth 2 (two) times daily.    Marland Kitchen levothyroxine (SYNTHROID, LEVOTHROID) 100 MCG tablet Take 100 mcg by mouth daily before breakfast.    . medroxyPROGESTERone (PROVERA) 10 MG tablet Take 1 tablet (10 mg total) by mouth 2 (two) times daily. 60 tablet 0  . metoprolol succinate (TOPROL-XL) 25 MG 24 hr tablet Take 25 mg by mouth daily.    . metroNIDAZOLE (METROGEL) 0.75 % gel Apply 1 application topically daily. Patient places on her cheeks and nose, only about once or twice a week depending on the climate.    . nitroGLYCERIN (NITROSTAT) 0.4 MG SL tablet Place 0.4 mg under the tongue every 5 (five) minutes as needed for chest pain.    Marland Kitchen omega-3 acid ethyl esters (LOVAZA) 1 G capsule Take 1 g by mouth daily.    . pantoprazole (PROTONIX) 40 MG tablet Take 1 tablet (40 mg total) by mouth daily. 30 tablet 11  . potassium chloride SA (K-DUR,KLOR-CON) 20 MEQ tablet Take 20 mEq by mouth as needed (patient takes with furosemide as need for fluid retention).     No current facility-administered medications for this visit.    Allergies:   Bee venom and Iohexol    Social History:  The patient  reports that she quit smoking about 46 years ago. Her smoking use included Cigarettes. She has a .4 pack-year smoking history. She does not have any smokeless tobacco history on file. She reports that she does not drink alcohol or use illicit drugs.   Family History:  The patient's family history includes Cancer in her mother; Colon cancer in her cousin; Diabetes in her mother and sister;  Heart attack in her maternal grandmother and mother; Heart disease in her mother; Heart failure in her mother; Hyperlipidemia in her sister; Hypertension in her mother; Other in her father. There is no history of Esophageal cancer, Stomach cancer, or Stroke.    ROS:   Please see the history of present illness.   Review of Systems  Cardiovascular: Positive for dyspnea on exertion.  Hematologic/Lymphatic: Positive for bleeding problem.  Musculoskeletal: Positive for joint swelling.  Neurological: Positive for loss of balance.  All other systems reviewed and are negative.    PHYSICAL EXAM: VS:  BP 138/80 mmHg  Pulse 59  Ht 5\' 6"  (1.676 m)  Wt 197 lb 12.8 oz (89.721 kg)  BMI 31.94 kg/m2    Wt Readings from Last 3 Encounters:  07/29/14 197 lb 12.8 oz (89.721 kg)  07/04/14  198 lb 12.8 oz (90.175 kg)  06/26/14 204 lb 2.3 oz (92.6 kg)     GEN: Well nourished, well developed, in no acute distress HEENT: normal Neck: no JVD,  no masses Cardiac:  Normal S1/S2, RRR; no murmur ,  no rubs or gallops, no edema  Respiratory:  clear to auscultation bilaterally, no wheezing, rhonchi or rales. GI: soft, nontender, nondistended, + BS MS: no deformity or atrophy Skin: warm and dry  Neuro:  CNs II-XII intact, Strength and sensation are intact Psych: Normal affect   EKG:  EKG is ordered today.  It demonstrates:   Sinus bradycardia, HR 59 normal axis, nonspecific ST-T wave changes, no change from prior tracing   Recent Labs: 06/25/2014: ALT 17 06/29/2014: BUN 10; Creatinine 0.52; Potassium 3.5; Sodium 141 06/30/2014: Hemoglobin 7.9*; Platelets 137*    Lipid Panel    Component Value Date/Time   CHOL 114 09/01/2013 1009   TRIG 78.0 09/01/2013 1009   HDL 41.80 09/01/2013 1009   CHOLHDL 3 09/01/2013 1009   VLDL 15.6 09/01/2013 1009   LDLCALC 57 09/01/2013 1009      ASSESSMENT AND PLAN:  Pre-operative cardiovascular examination The patient does not have any unstable cardiac conditions.   Upon evaluation today, she can achieve 4 METs or greater without anginal symptoms.  Her dyspnea with exertion is stable over the past year without significant change. Echocardiogram in April demonstrated normal LV function. According to [facility] and AHA guidelines, she requires no further cardiac workup prior to her noncardiac surgery and should be at acceptable risk. I reviewed this today with Dr. Mare Ferrari (DOD). He agreed. Our service is available as necessary in the perioperative period.   Coronary artery disease involving native coronary artery of native heart without angina pectoris  No angina. She is off of Plavix and aspirin because of her recent admission to the hospital with blood loss anemia. Her vaginal bleeding has slowed down. Given her prior PCI/stent to the circumflex, it would be ideal for her to remain on at least one antiplatelet agent. I will see if gynecology will allow her to resume aspirin 81 mg daily. Continue beta blocker, statin.  Essential hypertension Blood pressure is controlled.  Hyperlipidemia Continue statin.  SOB (shortness of breath)  This is chronic. It is stable. She has been evaluated by pulmonology in the past without significant findings. Echocardiogram in April demonstrated normal LV function. She does not look volume overloaded on exam. Her LE edema seems to be dependent and secondary to venous insufficiency. I will check a basic metabolic panel and BNP. If her BNP is elevated, I will ask her to take Lasix on a daily basis instead of as needed.   Current medicines are reviewed at length with the patient today.  Concerns regarding medicines are as outlined above.  The following changes have been made:    Resume ASA 81 mg QD if ok with Gyn.   Labs/ tests ordered today include:  Orders Placed This Encounter  Procedures  . Basic Metabolic Panel (BMET)  . B Nat Peptide  . EKG 12-Lead    Disposition:   FU with Dr. Casandra [name] as planned.    Signed, Versie Starks, MHS 07/29/2014 1:25 PM    [facility] [address], [place], [place]  [address] Phone: [phone]; Fax: [phone]

## 2014-07-29 ENCOUNTER — Encounter (HOSPITAL_COMMUNITY): Payer: Self-pay

## 2014-07-29 ENCOUNTER — Encounter: Payer: Self-pay | Admitting: Physician Assistant

## 2014-07-29 ENCOUNTER — Telehealth: Payer: Self-pay | Admitting: Gynecologic Oncology

## 2014-07-29 ENCOUNTER — Ambulatory Visit (INDEPENDENT_AMBULATORY_CARE_PROVIDER_SITE_OTHER): Payer: Medicare Other | Admitting: Physician Assistant

## 2014-07-29 VITALS — BP 138/80 | HR 59 | Ht 66.0 in | Wt 197.8 lb

## 2014-07-29 DIAGNOSIS — R0602 Shortness of breath: Secondary | ICD-10-CM | POA: Diagnosis not present

## 2014-07-29 DIAGNOSIS — Z01810 Encounter for preprocedural cardiovascular examination: Secondary | ICD-10-CM

## 2014-07-29 DIAGNOSIS — E785 Hyperlipidemia, unspecified: Secondary | ICD-10-CM

## 2014-07-29 DIAGNOSIS — I10 Essential (primary) hypertension: Secondary | ICD-10-CM

## 2014-07-29 DIAGNOSIS — H903 Sensorineural hearing loss, bilateral: Secondary | ICD-10-CM | POA: Diagnosis not present

## 2014-07-29 DIAGNOSIS — I2510 Atherosclerotic heart disease of native coronary artery without angina pectoris: Secondary | ICD-10-CM | POA: Diagnosis not present

## 2014-07-29 LAB — BASIC METABOLIC PANEL
BUN: 9 mg/dL (ref 6–23)
CALCIUM: 9.5 mg/dL (ref 8.4–10.5)
CO2: 27 mEq/L (ref 19–32)
Chloride: 105 mEq/L (ref 96–112)
Creatinine, Ser: 0.68 mg/dL (ref 0.40–1.20)
GFR: 90.45 mL/min (ref >60.00)
GLUCOSE: 126 mg/dL — AB (ref 70–99)
Potassium: 3.9 mEq/L (ref 3.5–5.1)
Sodium: 135 mEq/L (ref 135–145)

## 2014-07-29 LAB — BRAIN NATRIURETIC PEPTIDE: PRO B NATRI PEPTIDE: 261 pg/mL — AB (ref 0.0–100.0)

## 2014-07-29 NOTE — Patient Instructions (Addendum)
Medication Instructions:  Your physician recommends that you continue on your current medications as directed. Please refer to the Current Medication list given to you today.  Labwork: Your physician recommends that you have labs today BMET and BNP  Testing/Procedures: NONE  Follow-Up: Your physician recommends that you schedule a follow-up appointment as needed  Any Other Special Instructions Will Be Listed Below (If Applicable).  Please ask Dr. [name] if it is okay for you to resume taking Aspirin 81 mg by mouth daily before and after surgery.  Your physician recommends that is okay for you to return to cardiac rehabilitation.

## 2014-07-29 NOTE — Telephone Encounter (Signed)
Spoke with the patient's sister.  She called the office earlier concerned that her sister's cardiologist would like her to be on aspirin prior to surgery.  Situation discussed with Dr. [name], who would still like the patient to not take aspirin at this time.  Verbalizing understanding.  Advised to call for any questions or concerns.

## 2014-07-29 NOTE — Progress Notes (Signed)
Received message from Dr. Serita Grit office.   The patient cannot take ASA at this time. It can be resumed when felt to be safe from surgical standpoint. Richardson Dopp, PA-C   07/29/2014 5:23 PM

## 2014-08-01 ENCOUNTER — Encounter (HOSPITAL_COMMUNITY): Payer: Self-pay

## 2014-08-01 ENCOUNTER — Telehealth: Payer: Self-pay | Admitting: *Deleted

## 2014-08-01 DIAGNOSIS — I10 Essential (primary) hypertension: Secondary | ICD-10-CM

## 2014-08-01 DIAGNOSIS — R0602 Shortness of breath: Secondary | ICD-10-CM

## 2014-08-01 NOTE — Telephone Encounter (Signed)
lmptcb to go over lab results and med changes 

## 2014-08-02 MED ORDER — FUROSEMIDE 20 MG PO TABS: 20.0000 mg | ORAL_TABLET | Freq: Every day | ORAL | Status: DC

## 2014-08-02 NOTE — Telephone Encounter (Signed)
Weights look ok.  She only needs to check weight in AM prior to eating, dressing. Take Lasix and K+ as recommended. Check BMET as planned. Richardson Dopp, PA-C   08/02/2014 1:33 PM

## 2014-08-02 NOTE — Telephone Encounter (Signed)
S/w pt's sister [name] and advised per [name]. PA to have pt only weigh in AM before eating or dressing. [name] states she knows upcoming surgery for pt is high risk, though how much more high risk is it with BNP elevated, she read lab results on MY CHART. [name] states she the BNP was elevated, which she found on line that meant she had heart failure. I stated this is an indicator for HF though [name] did not say that pt does have HF. [name] states she worried about sister's surgery 5/31 and asked me if there is fluid around pt's heart how much more risky does that make the surgery. I stated I cannot answer that and that the provider should answer that question. I asked her would she feel better if [name] s/w her and pt about the BNP level as being addressed with the upcoming surgery. [name] said that would be great, or if [name] wanted to let me know then they are fine with me calling them back. I said I will send [name] my note from today and will try to get to them by the end of this week. [name] said ok and thank you for all of my help.

## 2014-08-02 NOTE — Telephone Encounter (Signed)
lmptcb to advise just check weight in the AM before dressing and eating, continue with the med changes as stated earlier today.

## 2014-08-02 NOTE — Telephone Encounter (Signed)
Pt's sister [name] DPR on file. [name] aware of lab results and med changes. [name] states to me that pt saw her lab results on MY CHART. [place] 5/13 stated BNP was elevated then take lasix 20 mg daily. Pt on her started on own 5/14 increasing the lasix to 20 mg daily before she was notified of her lab results. I asked pt's sister did she start taking the K+ daily as well, sister said no. I advised to continue the lasix 20 mg daily, though pt does need to start K+ 10 meq daily. BMET, BNP 5/26 scheduled with pt's sister. I stated that I will d/w [name]. PA of our conversation today. [name] said ok and thank you for our help.  Pt's sister did give some weight that the pt has given her: I advised that I will show Kimberly Reaper W. PA weight and cb if he has any recommendations.   5/12 in the PM 196  5/13 AM 198; 5/13 PM 196.8  5/14 AM 195.6; 5/14 PM 197.4  5/15 AM 194.6; 5/14 PM 193.4  5/16 AM 193; 5/16 PM 195.8

## 2014-08-03 ENCOUNTER — Encounter (HOSPITAL_COMMUNITY): Payer: Self-pay

## 2014-08-05 ENCOUNTER — Encounter (HOSPITAL_COMMUNITY): Payer: Self-pay

## 2014-08-05 NOTE — Telephone Encounter (Signed)
The BNP is just mildly elevated. She likely has an element of diastolic CHF contributing to her dyspnea. This just means that her LV is a little stiff and results in impaired relaxation and improper LV filling. The end result is she can get short of breath from this. She can proceed with surgery and is at acceptable risk.  This minimally elevated BNP does not significantly increase her risk. The surgeon just has to be careful with volume replacement in the perioperative period. Richardson Dopp, PA-C   08/05/2014 2:33 PM

## 2014-08-05 NOTE — Patient Instructions (Addendum)
AMARYA KUEHL  08/05/2014   Your procedure is scheduled on: Tuesday 08/16/14  Report to [facility]  Entrance and follow signs to               [place] at [place] AM.  Call this number if you have problems the morning of surgery [phone]   Remember: ONLY 1 PERSON MAY GO WITH YOU TO SHORT STAY TO GET  READY MORNING OF [place].  Do not eat food or drink liquids :After Midnight.    Take these medicines the morning of surgery with A SIP OF WATER: synthroid, metoprolol, protonix                               You may not have any metal on your body including hair pins and              piercings  Do not wear jewelry, make-up, lotions, powders or perfumes, deodorant             Do not wear nail polish.  Do not shave  48 hours prior to surgery.              Men may shave face and neck.   Do not bring valuables to the hospital. [place].  Contacts, dentures or bridgework may not be worn into surgery.  Leave suitcase in the car. After surgery it may be brought to your room.     Patients discharged the day of surgery will not be allowed to drive home.  Name and phone number of your driver:  Special Instructions: N/A              Please read over the following fact sheets you were given: _____________________________________________________________________             [facility] - Preparing for Surgery Before surgery, you can play an important role.  Because skin is not sterile, your skin needs to be as free of germs as possible.  You can reduce the number of germs on your skin by washing with CHG (chlorahexidine gluconate) soap before surgery.  CHG is an antiseptic cleaner which kills germs and bonds with the skin to continue killing germs even after washing. Please DO NOT use if you have an allergy to CHG or antibacterial soaps.  If your skin becomes reddened/irritated stop using the CHG and  inform your nurse when you arrive at Short Stay. Do not shave (including legs and underarms) for at least 48 hours prior to the first CHG shower.  You may shave your face/neck. Please follow these instructions carefully:  1.  Shower with CHG Soap the night before surgery and the  morning of Surgery.  2.  If you choose to wash your hair, wash your hair first as usual with your  normal  shampoo.  3.  After you shampoo, rinse your hair and body thoroughly to remove the  shampoo.                           4.  Use CHG as you would any other liquid soap.  You can apply chg directly  to the skin and wash  Gently with a scrungie or clean washcloth.  5.  Apply the CHG Soap to your body ONLY FROM THE NECK DOWN.   Do not use on face/ open                           Wound or open sores. Avoid contact with eyes, ears mouth and genitals (private parts).                       Wash face,  Genitals (private parts) with your normal soap.             6.  Wash thoroughly, paying special attention to the area where your surgery  will be performed.  7.  Thoroughly rinse your body with warm water from the neck down.  8.  DO NOT shower/wash with your normal soap after using and rinsing off  the CHG Soap.                9.  Pat yourself dry with a clean towel.            10.  Wear clean pajamas.            11.  Place clean sheets on your bed the night of your first shower and do not  sleep with pets. Day of Surgery : Do not apply any lotions/deodorants the morning of surgery.  Please wear clean clothes to the hospital/surgery center.  FAILURE TO FOLLOW THESE INSTRUCTIONS MAY RESULT IN THE CANCELLATION OF YOUR SURGERY PATIENT SIGNATURE_________________________________  NURSE SIGNATURE__________________________________  ________________________________________________________________________  WHAT IS A BLOOD TRANSFUSION? Blood Transfusion Information  A transfusion is the replacement of blood or  some of its parts. Blood is made up of multiple cells which provide different functions.  Red blood cells carry oxygen and are used for blood loss replacement.  White blood cells fight against infection.  Platelets control bleeding.  Plasma helps clot blood.  Other blood products are available for specialized needs, such as hemophilia or other clotting disorders. BEFORE THE TRANSFUSION  Who gives blood for transfusions?   Healthy volunteers who are fully evaluated to make sure their blood is safe. This is blood bank blood. Transfusion therapy is the safest it has ever been in the practice of medicine. Before blood is taken from a donor, a complete history is taken to make sure that person has no history of diseases nor engages in risky social behavior (examples are intravenous drug use or sexual activity with multiple partners). The donor's travel history is screened to minimize risk of transmitting infections, such as malaria. The donated blood is tested for signs of infectious diseases, such as HIV and hepatitis. The blood is then tested to be sure it is compatible with you in order to minimize the chance of a transfusion reaction. If you or a relative donates blood, this is often done in anticipation of surgery and is not appropriate for emergency situations. It takes many days to process the donated blood. RISKS AND COMPLICATIONS Although transfusion therapy is very safe and saves many lives, the main dangers of transfusion include:  1. Getting an infectious disease. 2. Developing a transfusion reaction. This is an allergic reaction to something in the blood you were given. Every precaution is taken to prevent this. The decision to have a blood transfusion has been considered carefully by your caregiver before blood is given. Blood is not given unless the benefits outweigh  the risks. AFTER THE TRANSFUSION  Right after receiving a blood transfusion, you will usually feel much better and  more energetic. This is especially true if your red blood cells have gotten low (anemic). The transfusion raises the level of the red blood cells which carry oxygen, and this usually causes an energy increase.  The nurse administering the transfusion will monitor you carefully for complications. HOME CARE INSTRUCTIONS  No special instructions are needed after a transfusion. You may find your energy is better. Speak with your caregiver about any limitations on activity for underlying diseases you may have. SEEK MEDICAL CARE IF:   Your condition is not improving after your transfusion.  You develop redness or irritation at the intravenous (IV) site. SEEK IMMEDIATE MEDICAL CARE IF:  Any of the following symptoms occur over the next 12 hours:  Shaking chills.  You have a temperature by mouth above 102 F (38.9 C), not controlled by medicine.  Chest, back, or muscle pain.  People around you feel you are not acting correctly or are confused.  Shortness of breath or difficulty breathing.  Dizziness and fainting.  You get a rash or develop hives.  You have a decrease in urine output.  Your urine turns a dark color or changes to pink, red, or brown. Any of the following symptoms occur over the next 10 days:  You have a temperature by mouth above 102 F (38.9 C), not controlled by medicine.  Shortness of breath.  Weakness after normal activity.  The white part of the eye turns yellow (jaundice).  You have a decrease in the amount of urine or are urinating less often.  Your urine turns a dark color or changes to pink, red, or brown. Document Released: 03/01/2000 Document Revised: 05/27/2011 Document Reviewed: 10/19/2007 ExitCare Patient Information 2014 [place].  _______________________________________________________________________  Incentive Spirometer  An incentive spirometer is a tool that can help keep your lungs clear and active. This tool measures how well you  are filling your lungs with each breath. Taking long deep breaths may help reverse or decrease the chance of developing breathing (pulmonary) problems (especially infection) following:  A long period of time when you are unable to move or be active. BEFORE THE PROCEDURE   If the spirometer includes an indicator to show your best effort, your nurse or respiratory therapist will set it to a desired goal.  If possible, sit up straight or lean slightly forward. Try not to slouch.  Hold the incentive spirometer in an upright position. INSTRUCTIONS FOR USE  3. Sit on the edge of your bed if possible, or sit up as far as you can in bed or on a chair. 4. Hold the incentive spirometer in an upright position. 5. Breathe out normally. 6. Place the mouthpiece in your mouth and seal your lips tightly around it. 7. Breathe in slowly and as deeply as possible, raising the piston or the ball toward the top of the column. 8. Hold your breath for 3-5 seconds or for as long as possible. Allow the piston or ball to fall to the bottom of the column. 9. Remove the mouthpiece from your mouth and breathe out normally. 10. Rest for a few seconds and repeat Steps 1 through 7 at least 10 times every 1-2 hours when you are awake. Take your time and take a few normal breaths between deep breaths. 11. The spirometer may include an indicator to show your best effort. Use the indicator as a goal to work  toward during each repetition. 12. After each set of 10 deep breaths, practice coughing to be sure your lungs are clear. If you have an incision (the cut made at the time of surgery), support your incision when coughing by placing a pillow or rolled up towels firmly against it. Once you are able to get out of bed, walk around indoors and cough well. You may stop using the incentive spirometer when instructed by your caregiver.  RISKS AND COMPLICATIONS  Take your time so you do not get dizzy or light-headed.  If you are in  pain, you may need to take or ask for pain medication before doing incentive spirometry. It is harder to take a deep breath if you are having pain. AFTER USE  Rest and breathe slowly and easily.  It can be helpful to keep track of a log of your progress. Your caregiver can provide you with a simple table to help with this. If you are using the spirometer at home, follow these instructions: [place] IF:   You are having difficultly using the spirometer.  You have trouble using the spirometer as often as instructed.  Your pain medication is not giving enough relief while using the spirometer.  You develop fever of 100.5 F (38.1 C) or higher. SEEK IMMEDIATE MEDICAL CARE IF:   You cough up bloody sputum that had not been present before.  You develop fever of 102 F (38.9 C) or greater.  You develop worsening pain at or near the incision site. MAKE SURE YOU:   Understand these instructions.  Will watch your condition.  Will get help right away if you are not doing well or get worse. Document Released: 07/15/2006 Document Revised: 05/27/2011 Document Reviewed: 09/15/2006 ExitCare Patient Information 2014 ExitCare, [place].   ________________________________________________________________________    CLEAR LIQUID DIET   Foods Allowed                                                                     Foods Excluded  Coffee and tea, regular and decaf                             liquids that you cannot  Plain Jell-O in any flavor                                             see through such as: Fruit ices (not with fruit pulp)                                     milk, soups, orange juice  Iced Popsicles                                    All solid food Carbonated beverages, regular and diet  Cranberry, grape and apple juices Sports drinks like Gatorade Lightly seasoned clear broth or consume(fat free) Sugar, honey syrup  Sample  Menu Breakfast                                Lunch                                     Supper Cranberry juice                    Beef broth                            Chicken broth Jell-O                                     Grape juice                           Apple juice Coffee or tea                        Jell-O                                      Popsicle                                                Coffee or tea                        Coffee or tea  _____________________________________________________________________

## 2014-08-05 NOTE — Pre-Procedure Instructions (Addendum)
07-29-14 Cardiac Clearance, Richardson Dopp, epic 06-26-14 CXR, epic 07-29-14 EKG, epic 06/2013 Echo, epic 03-25-13 heart cath

## 2014-08-08 ENCOUNTER — Encounter (HOSPITAL_COMMUNITY): Payer: Self-pay

## 2014-08-08 ENCOUNTER — Encounter (HOSPITAL_COMMUNITY)
Admission: RE | Admit: 2014-08-08 | Discharge: 2014-08-08 | Disposition: A | Discharge status: Home / Self Care | Payer: Medicare Other | Source: Ambulatory Visit | Attending: Gynecologic Oncology | Admitting: Gynecologic Oncology

## 2014-08-08 DIAGNOSIS — Z01812 Encounter for preprocedural laboratory examination: Secondary | ICD-10-CM | POA: Insufficient documentation

## 2014-08-08 DIAGNOSIS — N839 Noninflammatory disorder of ovary, fallopian tube and broad ligament, unspecified: Secondary | ICD-10-CM | POA: Insufficient documentation

## 2014-08-08 LAB — CBC
HCT: 35.4 % — ABNORMAL LOW (ref 36.0–46.0)
Hemoglobin: 10.8 g/dL — ABNORMAL LOW (ref 12.0–15.0)
MCH: 24.6 pg — ABNORMAL LOW (ref 26.0–34.0)
MCHC: 30.5 g/dL (ref 30.0–36.0)
MCV: 80.6 fL (ref 78.0–100.0)
Platelets: 214 10*3/uL (ref 150–400)
RBC: 4.39 MIL/uL (ref 3.87–5.11)
RDW: 13.8 % (ref 11.5–15.5)
WBC: 6.1 10*3/uL (ref 4.0–10.5)

## 2014-08-08 LAB — URINALYSIS, ROUTINE W REFLEX MICROSCOPIC
BILIRUBIN URINE: NEGATIVE
GLUCOSE, UA: NEGATIVE mg/dL
HGB URINE DIPSTICK: NEGATIVE
KETONES UR: NEGATIVE mg/dL
Nitrite: NEGATIVE
Protein, ur: NEGATIVE mg/dL
Specific Gravity, Urine: 1.028 (ref 1.005–1.030)
Urobilinogen, UA: 0.2 mg/dL (ref 0.0–1.0)
pH: 5 (ref 5.0–8.0)

## 2014-08-08 LAB — URINE MICROSCOPIC-ADD ON

## 2014-08-08 LAB — COMPREHENSIVE METABOLIC PANEL
ALBUMIN: 3.9 g/dL (ref 3.5–5.0)
ALT: 18 U/L (ref 14–54)
ANION GAP: 6 (ref 5–15)
AST: 21 U/L (ref 15–41)
Alkaline Phosphatase: 61 U/L (ref 38–126)
BILIRUBIN TOTAL: 0.5 mg/dL (ref 0.3–1.2)
BUN: 17 mg/dL (ref 6–20)
CALCIUM: 9.4 mg/dL (ref 8.9–10.3)
CHLORIDE: 106 mmol/L (ref 101–111)
CO2: 24 mmol/L (ref 22–32)
Creatinine, Ser: 0.71 mg/dL (ref 0.44–1.00)
GFR calc Af Amer: >60 mL/min (ref >60)
GFR calc non Af Amer: >60 mL/min (ref >60)
Glucose, Bld: 96 mg/dL (ref 65–99)
POTASSIUM: 4 mmol/L (ref 3.5–5.1)
SODIUM: 136 mmol/L (ref 135–145)
Total Protein: 7 g/dL (ref 6.5–8.1)

## 2014-08-08 NOTE — Telephone Encounter (Signed)
lmom ok per Brynda Rim. PA for pt to have surgery . If she has any questions cb [phone].

## 2014-08-09 DIAGNOSIS — D649 Anemia, unspecified: Secondary | ICD-10-CM | POA: Diagnosis not present

## 2014-08-10 ENCOUNTER — Encounter (HOSPITAL_COMMUNITY): Payer: Self-pay

## 2014-08-11 ENCOUNTER — Other Ambulatory Visit (INDEPENDENT_AMBULATORY_CARE_PROVIDER_SITE_OTHER): Payer: Medicare Other | Admitting: *Deleted

## 2014-08-11 DIAGNOSIS — R0602 Shortness of breath: Secondary | ICD-10-CM

## 2014-08-11 DIAGNOSIS — I10 Essential (primary) hypertension: Secondary | ICD-10-CM | POA: Diagnosis not present

## 2014-08-11 LAB — BASIC METABOLIC PANEL
BUN: 10 mg/dL (ref 6–23)
CHLORIDE: 104 meq/L (ref 96–112)
CO2: 26 mEq/L (ref 19–32)
Calcium: 9.2 mg/dL (ref 8.4–10.5)
Creatinine, Ser: 0.79 mg/dL (ref 0.40–1.20)
GFR: 76.07 mL/min (ref >60.00)
GLUCOSE: 108 mg/dL — AB (ref 70–99)
Potassium: 3.7 mEq/L (ref 3.5–5.1)
Sodium: 136 mEq/L (ref 135–145)

## 2014-08-11 LAB — BRAIN NATRIURETIC PEPTIDE: Pro B Natriuretic peptide (BNP): 206 pg/mL — ABNORMAL HIGH (ref 0.0–100.0)

## 2014-08-12 ENCOUNTER — Encounter (HOSPITAL_COMMUNITY): Payer: Self-pay

## 2014-08-12 ENCOUNTER — Telehealth: Payer: Self-pay | Admitting: *Deleted

## 2014-08-12 NOTE — Telephone Encounter (Signed)
Pt notified of lab results with verbal understanding by phone. 

## 2014-08-12 NOTE — Telephone Encounter (Signed)
lmptcb go over lab results

## 2014-08-16 ENCOUNTER — Inpatient Hospital Stay (HOSPITAL_COMMUNITY)
Admission: RE | Admit: 2014-08-16 | Discharge: 2014-08-18 | DRG: 743 | Disposition: A | Discharge status: Home / Self Care | Payer: Medicare Other | Source: Ambulatory Visit | Attending: Obstetrics & Gynecology | Admitting: Obstetrics & Gynecology

## 2014-08-16 ENCOUNTER — Inpatient Hospital Stay (HOSPITAL_COMMUNITY): Payer: Medicare Other | Admitting: Anesthesiology

## 2014-08-16 ENCOUNTER — Encounter (HOSPITAL_COMMUNITY): Payer: Self-pay | Admitting: *Deleted

## 2014-08-16 ENCOUNTER — Encounter (HOSPITAL_COMMUNITY)
Admission: RE | Disposition: A | Discharge status: Home / Self Care | Payer: Self-pay | Source: Ambulatory Visit | Attending: Obstetrics & Gynecology

## 2014-08-16 DIAGNOSIS — I252 Old myocardial infarction: Secondary | ICD-10-CM

## 2014-08-16 DIAGNOSIS — I9581 Postprocedural hypotension: Secondary | ICD-10-CM | POA: Diagnosis not present

## 2014-08-16 DIAGNOSIS — Z7982 Long term (current) use of aspirin: Secondary | ICD-10-CM

## 2014-08-16 DIAGNOSIS — Z79899 Other long term (current) drug therapy: Secondary | ICD-10-CM

## 2014-08-16 DIAGNOSIS — I25119 Atherosclerotic heart disease of native coronary artery with unspecified angina pectoris: Secondary | ICD-10-CM

## 2014-08-16 DIAGNOSIS — N840 Polyp of corpus uteri: Secondary | ICD-10-CM

## 2014-08-16 DIAGNOSIS — N950 Postmenopausal bleeding: Principal | ICD-10-CM | POA: Diagnosis present

## 2014-08-16 DIAGNOSIS — I2510 Atherosclerotic heart disease of native coronary artery without angina pectoris: Secondary | ICD-10-CM | POA: Diagnosis present

## 2014-08-16 DIAGNOSIS — N8320 Unspecified ovarian cysts: Secondary | ICD-10-CM | POA: Diagnosis present

## 2014-08-16 DIAGNOSIS — Z87891 Personal history of nicotine dependence: Secondary | ICD-10-CM

## 2014-08-16 DIAGNOSIS — Z6831 Body mass index (BMI) 31.0-31.9, adult: Secondary | ICD-10-CM

## 2014-08-16 DIAGNOSIS — N838 Other noninflammatory disorders of ovary, fallopian tube and broad ligament: Secondary | ICD-10-CM

## 2014-08-16 DIAGNOSIS — I10 Essential (primary) hypertension: Secondary | ICD-10-CM | POA: Diagnosis not present

## 2014-08-16 DIAGNOSIS — E669 Obesity, unspecified: Secondary | ICD-10-CM | POA: Diagnosis present

## 2014-08-16 DIAGNOSIS — D259 Leiomyoma of uterus, unspecified: Secondary | ICD-10-CM | POA: Diagnosis not present

## 2014-08-16 DIAGNOSIS — K219 Gastro-esophageal reflux disease without esophagitis: Secondary | ICD-10-CM | POA: Diagnosis present

## 2014-08-16 DIAGNOSIS — Z01812 Encounter for preprocedural laboratory examination: Secondary | ICD-10-CM

## 2014-08-16 DIAGNOSIS — D649 Anemia, unspecified: Secondary | ICD-10-CM | POA: Diagnosis present

## 2014-08-16 DIAGNOSIS — N839 Noninflammatory disorder of ovary, fallopian tube and broad ligament, unspecified: Secondary | ICD-10-CM | POA: Diagnosis not present

## 2014-08-16 DIAGNOSIS — E039 Hypothyroidism, unspecified: Secondary | ICD-10-CM | POA: Diagnosis present

## 2014-08-16 DIAGNOSIS — N939 Abnormal uterine and vaginal bleeding, unspecified: Secondary | ICD-10-CM | POA: Diagnosis present

## 2014-08-16 HISTORY — DX: Ventral hernia without obstruction or gangrene: K43.9

## 2014-08-16 HISTORY — PX: ROBOTIC ASSISTED TOTAL HYSTERECTOMY WITH BILATERAL SALPINGO OOPHERECTOMY: SHX6086

## 2014-08-16 HISTORY — DX: Unspecified osteoarthritis, unspecified site: M19.90

## 2014-08-16 LAB — TYPE AND SCREEN
ABO/RH(D): A POS
ANTIBODY SCREEN: NEGATIVE

## 2014-08-16 SURGERY — ROBOTIC ASSISTED TOTAL HYSTERECTOMY WITH BILATERAL SALPINGO OOPHORECTOMY
Anesthesia: General | Laterality: Bilateral

## 2014-08-16 MED ORDER — GLYCOPYRROLATE 0.2 MG/ML IJ SOLN
INTRAMUSCULAR | Status: DC | PRN
  Administered 2014-08-16: .6 mg via INTRAVENOUS

## 2014-08-16 MED ORDER — OXYCODONE-ACETAMINOPHEN 5-325 MG PO TABS
1.0000 | ORAL_TABLET | ORAL | Status: DC | PRN
  Administered 2014-08-16 – 2014-08-17 (×3): 1 via ORAL
  Filled 2014-08-16 (×3): qty 1

## 2014-08-16 MED ORDER — CEFAZOLIN SODIUM-DEXTROSE 2-3 GM-% IV SOLR
INTRAVENOUS | Status: AC
  Filled 2014-08-16: qty 50

## 2014-08-16 MED ORDER — CEFAZOLIN SODIUM-DEXTROSE 2-3 GM-% IV SOLR
2.0000 g | INTRAVENOUS | Status: AC
  Administered 2014-08-16: 2 g via INTRAVENOUS

## 2014-08-16 MED ORDER — HYDROMORPHONE HCL 1 MG/ML IJ SOLN
0.5000 mg | INTRAMUSCULAR | Status: AC | PRN
  Administered 2014-08-16 (×4): 0.5 mg via INTRAVENOUS

## 2014-08-16 MED ORDER — SUCCINYLCHOLINE CHLORIDE 20 MG/ML IJ SOLN
INTRAMUSCULAR | Status: DC | PRN
  Administered 2014-08-16: 100 mg via INTRAVENOUS

## 2014-08-16 MED ORDER — ONDANSETRON HCL 4 MG/2ML IJ SOLN
4.0000 mg | Freq: Once | INTRAMUSCULAR | Status: AC | PRN
  Administered 2014-08-16: 4 mg via INTRAVENOUS

## 2014-08-16 MED ORDER — METOPROLOL SUCCINATE ER 25 MG PO TB24
25.0000 mg | ORAL_TABLET | Freq: Every morning | ORAL | Status: DC
  Filled 2014-08-16: qty 1

## 2014-08-16 MED ORDER — ONDANSETRON HCL 4 MG/2ML IJ SOLN: 4.0000 mg | Freq: Four times a day (QID) | INTRAMUSCULAR | Status: DC | PRN

## 2014-08-16 MED ORDER — NEOSTIGMINE METHYLSULFATE 10 MG/10ML IV SOLN
INTRAVENOUS | Status: AC
  Filled 2014-08-16: qty 1

## 2014-08-16 MED ORDER — HYDROMORPHONE HCL 1 MG/ML IJ SOLN: 0.2000 mg | INTRAMUSCULAR | Status: DC | PRN

## 2014-08-16 MED ORDER — ROCURONIUM BROMIDE 100 MG/10ML IV SOLN
INTRAVENOUS | Status: AC
  Filled 2014-08-16: qty 1

## 2014-08-16 MED ORDER — SODIUM CHLORIDE 0.9 % IJ SOLN
INTRAMUSCULAR | Status: AC
Start: 2014-08-16 — End: 2014-08-16
  Filled 2014-08-16: qty 10

## 2014-08-16 MED ORDER — EPHEDRINE SULFATE 50 MG/ML IJ SOLN
INTRAMUSCULAR | Status: AC
  Filled 2014-08-16: qty 1

## 2014-08-16 MED ORDER — CLOPIDOGREL BISULFATE 75 MG PO TABS
75.0000 mg | ORAL_TABLET | Freq: Every morning | ORAL | Status: DC
  Administered 2014-08-17: 75 mg via ORAL
  Filled 2014-08-16 (×2): qty 1

## 2014-08-16 MED ORDER — ONDANSETRON HCL 4 MG/2ML IJ SOLN
INTRAMUSCULAR | Status: DC | PRN
  Administered 2014-08-16: 4 mg via INTRAVENOUS

## 2014-08-16 MED ORDER — FENTANYL CITRATE (PF) 100 MCG/2ML IJ SOLN
INTRAMUSCULAR | Status: DC | PRN
  Administered 2014-08-16: 50 ug via INTRAVENOUS
  Administered 2014-08-16: 100 ug via INTRAVENOUS

## 2014-08-16 MED ORDER — PROMETHAZINE HCL 25 MG/ML IJ SOLN
INTRAMUSCULAR | Status: AC
  Administered 2014-08-16: 6.25 mg
  Filled 2014-08-16: qty 1

## 2014-08-16 MED ORDER — GLYCOPYRROLATE 0.2 MG/ML IJ SOLN
INTRAMUSCULAR | Status: AC
  Filled 2014-08-16: qty 3

## 2014-08-16 MED ORDER — STERILE WATER FOR IRRIGATION IR SOLN
Status: DC | PRN
  Administered 2014-08-16: 3000 mL

## 2014-08-16 MED ORDER — ATORVASTATIN CALCIUM 40 MG PO TABS
40.0000 mg | ORAL_TABLET | Freq: Every day | ORAL | Status: DC
  Administered 2014-08-16 – 2014-08-17 (×2): 40 mg via ORAL
  Filled 2014-08-16 (×3): qty 1

## 2014-08-16 MED ORDER — PHENYLEPHRINE HCL 10 MG/ML IJ SOLN
INTRAMUSCULAR | Status: AC
  Filled 2014-08-16: qty 1

## 2014-08-16 MED ORDER — IBUPROFEN 800 MG PO TABS: 800.0000 mg | ORAL_TABLET | Freq: Three times a day (TID) | ORAL | Status: DC | PRN

## 2014-08-16 MED ORDER — PROPOFOL 10 MG/ML IV BOLUS
INTRAVENOUS | Status: AC
  Filled 2014-08-16: qty 20

## 2014-08-16 MED ORDER — MIDAZOLAM HCL 2 MG/2ML IJ SOLN
INTRAMUSCULAR | Status: AC
  Filled 2014-08-16: qty 2

## 2014-08-16 MED ORDER — ROCURONIUM BROMIDE 100 MG/10ML IV SOLN
INTRAVENOUS | Status: DC | PRN
  Administered 2014-08-16: 30 mg via INTRAVENOUS

## 2014-08-16 MED ORDER — PANTOPRAZOLE SODIUM 40 MG PO TBEC
40.0000 mg | DELAYED_RELEASE_TABLET | Freq: Every day | ORAL | Status: DC
  Administered 2014-08-17 – 2014-08-18 (×2): 40 mg via ORAL
  Filled 2014-08-16 (×2): qty 1

## 2014-08-16 MED ORDER — LIDOCAINE HCL (CARDIAC) 20 MG/ML IV SOLN
INTRAVENOUS | Status: DC | PRN
  Administered 2014-08-16: 50 mg via INTRAVENOUS

## 2014-08-16 MED ORDER — NITROGLYCERIN 0.4 MG SL SUBL
0.4000 mg | SUBLINGUAL_TABLET | SUBLINGUAL | Status: DC | PRN
Start: 2014-08-16 — End: 2014-08-18

## 2014-08-16 MED ORDER — HYDROMORPHONE HCL 1 MG/ML IJ SOLN
INTRAMUSCULAR | Status: AC
  Filled 2014-08-16: qty 1

## 2014-08-16 MED ORDER — LACTATED RINGERS IV SOLN
INTRAVENOUS | Status: DC | PRN
  Administered 2014-08-16: 10:00:00 via INTRAVENOUS

## 2014-08-16 MED ORDER — SODIUM CHLORIDE 0.9 % IV SOLN
INTRAVENOUS | Status: DC
  Administered 2014-08-16: 23:00:00 via INTRAVENOUS

## 2014-08-16 MED ORDER — ONDANSETRON HCL 4 MG PO TABS: 4.0000 mg | ORAL_TABLET | Freq: Four times a day (QID) | ORAL | Status: DC | PRN

## 2014-08-16 MED ORDER — EPHEDRINE SULFATE 50 MG/ML IJ SOLN
INTRAMUSCULAR | Status: DC | PRN
  Administered 2014-08-16: 10 mg via INTRAVENOUS

## 2014-08-16 MED ORDER — MIDAZOLAM HCL 5 MG/5ML IJ SOLN
INTRAMUSCULAR | Status: DC | PRN
  Administered 2014-08-16: 1 mg via INTRAVENOUS

## 2014-08-16 MED ORDER — PHENYLEPHRINE HCL 10 MG/ML IJ SOLN
INTRAMUSCULAR | Status: DC | PRN
  Administered 2014-08-16: 40 ug via INTRAVENOUS

## 2014-08-16 MED ORDER — ASPIRIN EC 81 MG PO TBEC
81.0000 mg | DELAYED_RELEASE_TABLET | Freq: Every morning | ORAL | Status: DC
  Administered 2014-08-17 – 2014-08-18 (×2): 81 mg via ORAL
  Filled 2014-08-16 (×2): qty 1

## 2014-08-16 MED ORDER — PROPOFOL 10 MG/ML IV BOLUS
INTRAVENOUS | Status: DC | PRN
  Administered 2014-08-16: 180 mg via INTRAVENOUS

## 2014-08-16 MED ORDER — LEVOTHYROXINE SODIUM 100 MCG PO TABS
100.0000 ug | ORAL_TABLET | Freq: Every day | ORAL | Status: DC
  Administered 2014-08-17 – 2014-08-18 (×2): 100 ug via ORAL
  Filled 2014-08-16 (×4): qty 1

## 2014-08-16 MED ORDER — LACTATED RINGERS IR SOLN
Status: DC | PRN
  Administered 2014-08-16: 1000 mL

## 2014-08-16 MED ORDER — PROMETHAZINE HCL 25 MG/ML IJ SOLN
6.2500 mg | INTRAMUSCULAR | Status: DC | PRN
  Administered 2014-08-16: 6.25 mg via INTRAVENOUS

## 2014-08-16 MED ORDER — ONDANSETRON HCL 4 MG/2ML IJ SOLN
INTRAMUSCULAR | Status: AC
  Filled 2014-08-16: qty 2

## 2014-08-16 MED ORDER — FENTANYL CITRATE (PF) 250 MCG/5ML IJ SOLN
INTRAMUSCULAR | Status: AC
  Filled 2014-08-16: qty 5

## 2014-08-16 SURGICAL SUPPLY — 47 items
CABLE HIGH FREQUENCY MONO STRZ (ELECTRODE) ×2 IMPLANT
CHLORAPREP W/TINT 26ML (MISCELLANEOUS) ×2 IMPLANT
CORDS BIPOLAR (ELECTRODE) ×2 IMPLANT
COVER SURGICAL LIGHT HANDLE (MISCELLANEOUS) ×2 IMPLANT
COVER TIP SHEARS 8 DVNC (MISCELLANEOUS) ×1 IMPLANT
COVER TIP SHEARS 8MM DA VINCI (MISCELLANEOUS) ×1
DRAPE SHEET LG 3/4 BI-LAMINATE (DRAPES) ×4 IMPLANT
DRAPE SURG IRRIG POUCH 19X23 (DRAPES) ×2 IMPLANT
DRAPE TABLE BACK 44X90 PK DISP (DRAPES) ×4 IMPLANT
DRAPE WARM FLUID 44X44 (DRAPE) ×2 IMPLANT
DRSG TEGADERM 6X8 (GAUZE/BANDAGES/DRESSINGS) ×4 IMPLANT
ELECT REM PT RETURN 9FT ADLT (ELECTROSURGICAL) ×2
ELECTRODE REM PT RTRN 9FT ADLT (ELECTROSURGICAL) ×1 IMPLANT
GLOVE BIO SURGEON STRL SZ 6 (GLOVE) ×6 IMPLANT
GLOVE BIO SURGEON STRL SZ 6.5 (GLOVE) ×4 IMPLANT
GOWN STRL REUS W/ TWL LRG LVL3 (GOWN DISPOSABLE) ×3 IMPLANT
GOWN STRL REUS W/TWL LRG LVL3 (GOWN DISPOSABLE) ×3
HOLDER FOLEY CATH W/STRAP (MISCELLANEOUS) ×2 IMPLANT
KIT ACCESSORY DA VINCI DISP (KITS) ×1
KIT ACCESSORY DVNC DISP (KITS) ×1 IMPLANT
KIT BASIN OR (CUSTOM PROCEDURE TRAY) ×2 IMPLANT
LIQUID BAND (GAUZE/BANDAGES/DRESSINGS) ×2 IMPLANT
MANIPULATOR UTERINE 4.5 ZUMI (MISCELLANEOUS) ×2 IMPLANT
OCCLUDER COLPOPNEUMO (BALLOONS) ×2 IMPLANT
PEN SKIN MARKING BROAD (MISCELLANEOUS) ×2 IMPLANT
PORT ACCESS TROCAR AIRSEAL 12 (TROCAR) ×1 IMPLANT
PORT ACCESS TROCAR AIRSEAL 5M (TROCAR) ×1
POUCH SPECIMEN RETRIEVAL 10MM (ENDOMECHANICALS) IMPLANT
SET TRI-LUMEN FLTR TB AIRSEAL (TUBING) ×2 IMPLANT
SET TUBE IRRIG SUCTION NO TIP (IRRIGATION / IRRIGATOR) ×2 IMPLANT
SHEET LAVH (DRAPES) ×2 IMPLANT
SOLUTION ELECTROLUBE (MISCELLANEOUS) ×2 IMPLANT
SUT VIC AB 0 CT1 27 (SUTURE) ×1
SUT VIC AB 0 CT1 27XBRD ANTBC (SUTURE) ×1 IMPLANT
SUT VIC AB 4-0 PS2 27 (SUTURE) ×4 IMPLANT
SUT VICRYL 0 UR6 27IN ABS (SUTURE) ×2 IMPLANT
SYR 50ML LL SCALE MARK (SYRINGE) ×2 IMPLANT
TOWEL OR 17X26 10 PK STRL BLUE (TOWEL DISPOSABLE) ×4 IMPLANT
TOWEL OR NON WOVEN STRL DISP B (DISPOSABLE) ×2 IMPLANT
TRAP SPECIMEN MUCOUS 40CC (MISCELLANEOUS) IMPLANT
TRAY FOLEY W/METER SILVER 14FR (SET/KITS/TRAYS/PACK) ×2 IMPLANT
TRAY LAPAROSCOPIC (CUSTOM PROCEDURE TRAY) ×2 IMPLANT
TROCAR 12M 150ML BLUNT (TROCAR) ×2 IMPLANT
TROCAR BLADELESS OPT 5 100 (ENDOMECHANICALS) ×2 IMPLANT
TROCAR XCEL 12X100 BLDLESS (ENDOMECHANICALS) ×2 IMPLANT
TUBING INSUFFLATION 10FT LAP (TUBING) ×2 IMPLANT
WATER STERILE IRR 1500ML POUR (IV SOLUTION) ×4 IMPLANT

## 2014-08-16 NOTE — Progress Notes (Signed)
Pt's blood pressure was checked. BP was low, 92/39 and her pulse was 61. It was checked manually with a reading of 87/42. Pt is calm and resting for the evening. Pt has good urine output.Paged MD on call. New orders received to start pt on IV fluids (NS at 50 ml/hr). Will continue to monitor pt.

## 2014-08-16 NOTE — Anesthesia Procedure Notes (Signed)
Procedure Name: Intubation Date/Time: 08/16/2014 10:24 AM Performed by: Dimas Millin, Rafeal Skibicki F Pre-anesthesia Checklist: Patient identified, Emergency Drugs available, Suction available, Patient being monitored and Timeout performed Patient Re-evaluated:Patient Re-evaluated prior to inductionOxygen Delivery Method: Circle system utilized Preoxygenation: Pre-oxygenation with 100% oxygen Intubation Type: IV induction Laryngoscope Size: Miller and 2 Grade View: Grade I Tube type: Oral Number of attempts: 1 Airway Equipment and Method: Stylet Placement Confirmation: ETT inserted through vocal cords under direct vision,  positive ETCO2 and breath sounds checked- equal and bilateral Secured at: 22 cm Tube secured with: Tape Dental Injury: Teeth and Oropharynx as per pre-operative assessment

## 2014-08-16 NOTE — Transfer of Care (Signed)
Immediate Anesthesia Transfer of Care Note  Patient: [name]  Procedure(s) Performed: Procedure(s): ROBOTIC ASSISTED TOTAL HYSTERECTOMY WITH BILATERAL SALPINGO OOPHORECTOMY  (Bilateral)  Patient Location: PACU  Anesthesia Type:General  Level of Consciousness: awake, alert  and oriented  Airway & Oxygen Therapy: Patient Spontanous Breathing and Patient connected to face mask oxygen  Post-op Assessment: Report given to RN and Post -op Vital signs reviewed and stable  Post vital signs: Reviewed and stable  Last Vitals:  Filed Vitals:   08/16/14 0738  BP: 140/68  Pulse: 74  Temp: 36.6 C  Resp: 18    Complications: No apparent anesthesia complications

## 2014-08-16 NOTE — Anesthesia Postprocedure Evaluation (Signed)
  Anesthesia Post-op Note  Patient: [name]  Procedure(s) Performed: Procedure(s): ROBOTIC ASSISTED TOTAL HYSTERECTOMY WITH BILATERAL SALPINGO OOPHORECTOMY  (Bilateral)  Patient Location: PACU  Anesthesia Type:General  Level of Consciousness: awake, alert , oriented and patient cooperative  Airway and Oxygen Therapy: Patient Spontanous Breathing  Post-op Pain: mild, moderate  Post-op Assessment: Post-op Vital signs reviewed, Patient's Cardiovascular Status Stable, Respiratory Function Stable, Patent Airway, No signs of Nausea or vomiting and Pain level controlled  Post-op Vital Signs: stable  Last Vitals:  Filed Vitals:   08/16/14 1617  BP: 118/42  Pulse: 63  Temp: 36.6 C  Resp: 12    Complications: No apparent anesthesia complications

## 2014-08-16 NOTE — Anesthesia Preprocedure Evaluation (Addendum)
Anesthesia Evaluation  Patient identified by MRN, date of birth, ID band Patient awake    Reviewed: Allergy & Precautions, NPO status , Patient's Chart, lab work & pertinent test results  History of Anesthesia Complications (+) PONV  Airway Mallampati: II       Dental   Pulmonary former smoker,    Pulmonary exam normal       Cardiovascular hypertension, + CAD, + Past MI and + DOE Normal cardiovascular exam    Neuro/Psych    GI/Hepatic GERD-  ,  Endo/Other  Hypothyroidism   Renal/GU      Musculoskeletal  (+) Arthritis -,   Abdominal   Peds  Hematology  (+) anemia ,   Anesthesia Other Findings   Reproductive/Obstetrics                            Anesthesia Physical Anesthesia Plan  ASA: III  Anesthesia Plan: General   Post-op Pain Management:    Induction: Intravenous  Airway Management Planned: Oral ETT  Additional Equipment:   Intra-op Plan:   Post-operative Plan: Extubation in OR  Informed Consent: I have reviewed the patients History and Physical, chart, labs and discussed the procedure including the risks, benefits and alternatives for the proposed anesthesia with the patient or authorized representative who has indicated his/her understanding and acceptance.     Plan Discussed with: CRNA, Anesthesiologist and Surgeon  Anesthesia Plan Comments:         Anesthesia Quick Evaluation

## 2014-08-16 NOTE — H&P (Signed)
Chief Complaint  Patient presents with  . adnexal mass    Assessment/Plan:  Ms. ABBEY VEITH is a 74 y.o. year old G2 with a cardiac history and postmenopausal bleeding causing intolerance of anti-platelet therapy. She has a and endometrial lining on ultrasound and CT scan. Her last endometrial sampling on 07/04/14 was benign.  I performed a history, physical examination, and personally reviewed the patient's imaging films including the CT abdomen and pelvis from 06/26/14.  I do not believe that her postmenopausal bleeding is secondary to her fibroid. Instead I believe it is secondary to whatever process is causing the thickened endometrial lining. Even though she demonstrates benign pathology, in this patient who requires antiplatelet therapy for her significant coronary artery disease history, I'm advocating hysterectomy in order to prevent future episodes of severe vaginal hemorrhage. I am recommending a robotic hysterectomy with BSO, and frozen section, and staging if malignancy is identified in the endometrium or the ovary. Given the appearance on ultrasound and CT findings and its presence over several years I have a low suspicion that the left ovarian cyst is malignant. However I am recommending a CA-125 preoperatively (this has been drawn and is pending).  She saw her cardiologist Dr.Veranasi operatively and was recommended to hold anti-platelet therapy (except for 81 mg asa) preoperatively. We will restart all antiplatelet meds postop if no bleeding is encountered.  I discussed the risks of robotic hysterectomy including bleeding, infection, damage to internal organs (such as bladder,ureters, bowels), blood clot, reoperation and rehospitalization. We discussed positioning during robotic surgery and risk for nerve injury and compression injury. We discussed anticipated hospital stay (overnight) and anticipated discharge capabilities and postoperative recovery.  HPI: Eduardo Wurth is a 74 year old gravida 0 who is seen in consultation at the request of Dr. [name] for vaginal hemorrhage, postmenopausal bleeding, a left ovarian cyst, and concern for occult malignancy. The patient was admitted to [facility] on 06/26/2014 for vaginal bleeding and dizziness. She wasprofoundly anemic and was transfused. The source of the bleeding was vaginal. She was seen by the gynecology team who recommended transvaginal ultrasound scan. This was performed on 06/26/2014 and revealed a uterus measuring 7.5 x 3.6 x 5.5 cm with a uterine fundal fibroid measuring 3 x 2.9 x 3 cm. The endometrium measured 2 mm. There was an anechoic 7 x 6 x 6 cm left ovarian cyst that appeared grossly benign. She also underwent a CT scan of the abdomen and pelvis which showed "significant endometrial thickening" abnormal in a postmenopausal patient. It again identified the left ovarian cysts but did not find any concerning signs for metastatic ovarian cancer. There was no lymphadenopathy present.  On 07/04/14 she underwent endometrial sampling for benign endometrium.  Azharia has a several year history of vaginal spotting. In 2010 she underwent a benign endocervical polypectomy with a D&C. She also underwent a D&C in 2012 for submucosal fibroid. She was known to have an ovarian cyst at that time and a determination was made for expectant management given no evidence of malignancy. She does not report repeated sampling of the endometrial cavity since that last procedure.  In June 2015 she experienced an ST elevation MI and underwent PCI. Her ejection fraction was known to be approximately 55%. She was treated post stenting with antiplatelet therapy but had intolerance to everything other than aspirin and Plavix. She's been on these 2 agents for a proximally one year prior to last week's episode of severe vaginal bleeding. She last her cardiologist approximate  1 month ago. She is engaged in cardiac rehabilitation.  She denies shortness of breath or chest pain on exertion. She is able to walk up a flight of stairs without chest pain or shortness of breath.   She received preoperative cardiac optimization/risk stratification and has held ASA and Brilenta preop. The plan is to start this up postop.  In terval History: only vaginal spotting (light) while on Provera. Patient was placed on provera by gynecologist while an inpatient at [facility]. ASA and plavix were discontinued at that time.  Current Meds:  Outpatient Encounter Prescriptions as of 07/04/2014  Medication Sig  . atorvastatin (LIPITOR) 80 MG tablet TAKE 1 TABLET IN THE EVENING AT 6PM.  . Calcium Carb-Cholecalciferol (CALCIUM + D3) 600-200 MG-UNIT TABS Take 1 tablet by mouth daily.  . Diclofenac Sodium (VOLTAREN EX) Apply 1 application topically 2 (two) times daily. Apply to both knees.  . FUROSEMIDE PO Take 20 mg by mouth daily as needed (weight pain). unknown  . levothyroxine (SYNTHROID, LEVOTHROID) 100 MCG tablet Take 100 mcg by mouth daily before breakfast.  . medroxyPROGESTERone (PROVERA) 10 MG tablet Take 1 tablet (10 mg total) by mouth 2 (two) times daily.  . metoprolol succinate (TOPROL-XL) 25 MG 24 hr tablet TAKE 1 TABLET DAILY.  . metroNIDAZOLE (METROGEL) 0.75 % gel Apply 1 application topically daily. Patient places on her cheeks and nose, only about once or twice a week depending on the climate.  Marland Kitchen omega-3 acid ethyl esters (LOVAZA) 1 G capsule Take 1 g by mouth daily.  . pantoprazole (PROTONIX) 40 MG tablet Take 1 tablet (40 mg total) by mouth daily.  . potassium chloride SA (K-DUR,KLOR-CON) 20 MEQ tablet Take 20 mEq by mouth daily as needed (potassium).   . [DISCONTINUED] medroxyPROGESTERone (PROVERA) 10 MG tablet Take 1 tablet (10 mg total) by mouth 2 (two) times daily.  . clopidogrel (PLAVIX) 75 MG tablet     Allergy:  Allergies  Allergen Reactions  . Bee Venom Itching and  Swelling  . Iohexol Hives    Social Hx:  History   Social History  . Marital Status: Divorced    Spouse Name: N/A  . Number of Children: N/A  . Years of Education: N/A   Occupational History  . 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 Smoking status: Former Smoker -- 0.20 packs/day for 2 years    Types: Cigarettes    Quit date: 03/18/1968  . Smokeless tobacco: Not on file  . Alcohol Use: No  . Drug Use: No  . Sexual Activity: Not on file   Other Topics Concern  . Not on file   Social History Narrative   Lives in [place] by herself. Sister is nearby and is Healthcare POA.    Past Surgical Hx:  Past Surgical History  Procedure Laterality Date  . Cochlear implant    . Upper gastrointestinal endoscopy    . Cervical polypectomy      2010  . Cholecystectomy    . Uterine polyp      had D and C done to remove the polyp  . Left heart catheterization with coronary angiogram N/A 03/25/2013    Procedure: LEFT HEART CATHETERIZATION WITH CORONARY ANGIOGRAM; Surgeon: Burnell Blanks, MD; Location: [facility] CATH LAB; Service: Cardiovascular; Laterality: N/A;    Past Medical Hx:  Past Medical History  Diagnosis Date  . IV Contrast Allergy   . GERD (gastroesophageal reflux disease)   . Hypothyroidism   .  Rosacea   . Hearing loss     a. s/p cochlear implant on right, hearing aid on left.  . Obesity   . Fibroids     a. uterine - spotting noted since 03/11/2013.  Marland Kitchen CAD (coronary artery disease)     a. 03/2013 Infpost STEMI/PCI: LM nl, LAD min irregs, LCX 100 (3.0x12 Vision BMS), RCA mild ostial dzs, EF 55%.  . Essential hypertension, benign   . Ventricular tachycardia     a. Immediate post pci/MI - no complications, on BB.  Marland Kitchen Hyperlipidemia   . Myocardial infarction     Past Gynecological  History: G0. No family hx of cancer. No LMP recorded. Patient is postmenopausal.  Family Hx:  Family History  Problem Relation Age of Onset  . Esophageal cancer Neg Hx   . Stomach cancer Neg Hx   . Colon cancer Cousin   . Heart failure Mother     died in her 38's.  . Cancer Mother   . Diabetes Mother   . Heart disease Mother   . Hypertension Mother   . Other Father     died in WWII  . Hyperlipidemia Sister   . Diabetes Sister   . Heart attack Mother   . Heart attack Maternal Grandmother   . Stroke Neg Hx     Review of Systems:  Constitutional  Feels well,  ENT Normal appearing ears and nares bilaterally Skin/Breast  No rash, sores, jaundice, itching, dryness Cardiovascular  No chest pain, shortness of breath, or edema  Pulmonary  No cough or wheeze.  Gastro Intestinal  No nausea, vomitting, or diarrhoea. No bright red blood per rectum, no abdominal pain, change in bowel movement, or constipation.  Genito Urinary  No frequency, urgency, dysuria, see HPI Musculo Skeletal  No myalgia, arthralgia, joint swelling or pain  Neurologic  No weakness, numbness, change in gait,  Psychology  No depression, anxiety, insomnia.   Vitals: Blood pressure 130/42, pulse 70, temperature 98 F (36.7 C), temperature source Oral, resp. rate 18, height 5\' 6"  (1.676 m), weight 198 lb 12.8 oz (90.175 kg).  Physical Exam: WD in NAD Neck  Supple NROM, without any enlargements.  Lymph Node Survey No cervical supraclavicular or inguinal adenopathy Cardiovascular  Pulse normal rate, regularity and rhythm. S1 and S2 normal.  Lungs  Clear to auscultation bilateraly, without wheezes/crackles/rhonchi. Good air movement.  Skin  No rash/lesions/breakdown  Psychiatry  Alert and oriented to person, place, and time  Abdomen  Normoactive bowel sounds, abdomen soft, non-tender and obese with Soft  umbilical unincarcerated hernia.  Back No CVA tenderness Genito Urinary  Vulva/vagina: Normal external female genitalia. No lesions. No discharge or bleeding. Bladder/urethra: No lesions or masses, well supported bladder Vagina: smooth and regular, no lesions. Cervix: Normal appearing, no lesions. Uterus: Small, mobile, no parametrial involvement or nodularity. Adnexa: no palpable masses though exam limited by body habitus. Rectal  Good tone, no masses no cul de sac nodularity.  Extremities  No bilateral cyanosis, clubbing or edema.   [name], MD

## 2014-08-16 NOTE — Progress Notes (Addendum)
Pt states she has cochlear implant right with magnet and can not have MRIs. Also Pt has hair that is attached with small metal clips and plastic. OR staff notified.

## 2014-08-16 NOTE — Op Note (Signed)
OPERATIVE NOTE 08/16/14  Surgeon: [name]   Assistants: Dr Lahoma Crocker (an MD assistant was necessary for tissue manipulation, management of robotic instrumentation, retraction and positioning due to the complexity of the case and hospital policies).   Anesthesia: General endotracheal anesthesia  ASA Class: 3   Pre-operative Diagnosis: abnormal uterine bleeding and adnexal mass  Post-operative Diagnosis: endometrial polyp, left ovarian cyst, both benign  Operation: Robotic-assisted laparoscopic hysterectomy with bilateral salpingoophorectomy   Surgeon: [name]  Assistant Surgeon: Lahoma Crocker MD  Anesthesia: GET  Urine Output: 100cc  Operative Findings:  : The left ovary was enlarged to 10cm, ruptured upon specimen delivery vaginally. Benign cyst on frozen section. 8 week fibroid uterus with benign polyp in endometrium on frozen section. No abnormal intraperitoneal findings.  Estimated Blood Loss:  less than 50 mL      Total IV Fluids: 500 ml         Specimens: uterus, cervix,bilateral tubes and ovaries         Complications:  None; patient tolerated the procedure well.         Disposition: PACU - hemodynamically stable.  Procedure Details  The patient was seen in the Holding Room. The risks, benefits, complications, treatment options, and expected outcomes were discussed with the patient.  The patient concurred with the proposed plan, giving informed consent.  The site of surgery properly noted/marked. The patient was identified as [name] and the procedure verified as a Robotic-assisted hysterectomy with bilateral salpingo oophorectomy. A Time Out was held and the above information confirmed.  After induction of anesthesia, the patient was draped and prepped in the usual sterile manner. Pt was placed in supine position after anesthesia and draped and prepped in the usual sterile manner. The abdominal drape was placed after the  CholoraPrep had been allowed to dry for 3 minutes.  Her arms were tucked to her side with all appropriate precautions.  The chest was secured to the table.  The patient was placed in the semi-lithotomy position in [place].  The perineum was prepped with Betadine.  Foley catheter was placed.  A sterile speculum was placed in the vagina.  The cervix was grasped with a single-tooth tenaculum and dilated with Kennon Rounds dilators.  The ZUMI uterine manipulator with a medium colpotomizer ring was placed without difficulty.  A pneum occluder balloon was placed over the manipulator.  A second time-out was performed.  OG tube placement was confirmed and to suction.    Procedure:  The patient was brought to the operating room where general anesthesia was administered with no complications.  The patient was placed in the dorsal lithotomy position in padded Allen stirrups.  The arms were tucked at the sides with gel pads protecting the elbows and foam protecting the hands. The patient was then prepped.  A Foley was placed to gravity.  A medium size KOH ring was used to place around the cervix after the cervix had been dilated and then a RUMI manipulator was attached in the normal manner.  The patient was then draped in the normal manner.  Next, a 5 mm skin incision was made 1 cm below the subcostal margin in the midclavicular line.  The 5 mm Optiview port and scope was used for direct entry.  Opening pressure was under 10 mm CO2.  The abdomen was insufflated and the findings were noted as above.   At this point and all points during the procedure, the patient's intra-abdominal pressure did  not exceed 15 mmHg. Next, a 10 mm skin incision was made 2cm above the umbilicus and a right and left port was placed about 10 cm lateral to the robot port on the right and left side. All ports were placed under direct visualization.  The patient was placed in steep Trendelenburg.  Bowel was away into the upper abdomen.  The robot was  docked in the normal manner.  The hysterectomy was started after the round ligament on the right side was incised and the retroperitoneum was entered and the pararectal space was developed.  The ureter was noted to be on the medial leaf of the broad ligament.  The peritoneum above the ureter was incised and stretched and the infundibulopelvic ligament was skeletonized, cauterized and cut.  The posterior peritoneum was taken down to the level of the KOH ring.  The anterior peritoneum was also taken down.  The bladder flap was created to the level of the KOH ring.  The uterine artery on the right side was skeletonized, cauterized and cut in the normal manner.  A similar procedure was performed on the left.  The colpotomy was made and the uterus, cervix, bilateral ovaries and tubes were amputated and delivered through the vagina.  Pedicles were inspected and excellent hemostasis was achieved.    The colpotomy at the vaginal cuff was closed with Vicryl on a CT1 needle in a running manner.  Irrigation was used and excellent hemostasis was achieved.  At this point in the procedure was completed.  Robotic instruments were removed under direct visulaization.  The robot was undocked. The 10 mm ports were closed with Vicryl on a UR-5 needle and the fascia was closed with 0 Vicryl on a UR-5 needle.  The skin was closed with 4-0 Vicryl in a subcuticular manner.  Dermabond was applied.  Sponge, lap and needle counts correct x 2.  The patient was taken to the recovery room in stable condition.  The vagina was swabbed with  minimal bleeding noted.   All instrument and needle counts were correct x  3.   The patient was transferred to the recovery room in a stable condition.   [name], MD

## 2014-08-17 ENCOUNTER — Telehealth: Payer: Self-pay | Admitting: Interventional Cardiology

## 2014-08-17 ENCOUNTER — Encounter (HOSPITAL_COMMUNITY): Payer: Self-pay | Admitting: Gynecologic Oncology

## 2014-08-17 ENCOUNTER — Encounter (HOSPITAL_COMMUNITY): Payer: Self-pay

## 2014-08-17 DIAGNOSIS — Z01812 Encounter for preprocedural laboratory examination: Secondary | ICD-10-CM | POA: Diagnosis not present

## 2014-08-17 DIAGNOSIS — E669 Obesity, unspecified: Secondary | ICD-10-CM | POA: Diagnosis present

## 2014-08-17 DIAGNOSIS — I252 Old myocardial infarction: Secondary | ICD-10-CM | POA: Diagnosis not present

## 2014-08-17 DIAGNOSIS — Z87891 Personal history of nicotine dependence: Secondary | ICD-10-CM | POA: Diagnosis not present

## 2014-08-17 DIAGNOSIS — N859 Noninflammatory disorder of uterus, unspecified: Secondary | ICD-10-CM | POA: Diagnosis not present

## 2014-08-17 DIAGNOSIS — D649 Anemia, unspecified: Secondary | ICD-10-CM | POA: Diagnosis present

## 2014-08-17 DIAGNOSIS — N840 Polyp of corpus uteri: Secondary | ICD-10-CM | POA: Diagnosis present

## 2014-08-17 DIAGNOSIS — N950 Postmenopausal bleeding: Secondary | ICD-10-CM | POA: Diagnosis present

## 2014-08-17 DIAGNOSIS — E039 Hypothyroidism, unspecified: Secondary | ICD-10-CM | POA: Diagnosis present

## 2014-08-17 DIAGNOSIS — I10 Essential (primary) hypertension: Secondary | ICD-10-CM | POA: Diagnosis present

## 2014-08-17 DIAGNOSIS — I2510 Atherosclerotic heart disease of native coronary artery without angina pectoris: Secondary | ICD-10-CM | POA: Diagnosis present

## 2014-08-17 DIAGNOSIS — I9581 Postprocedural hypotension: Secondary | ICD-10-CM | POA: Diagnosis not present

## 2014-08-17 DIAGNOSIS — Z79899 Other long term (current) drug therapy: Secondary | ICD-10-CM | POA: Diagnosis not present

## 2014-08-17 DIAGNOSIS — K219 Gastro-esophageal reflux disease without esophagitis: Secondary | ICD-10-CM | POA: Diagnosis present

## 2014-08-17 DIAGNOSIS — Z6831 Body mass index (BMI) 31.0-31.9, adult: Secondary | ICD-10-CM | POA: Diagnosis not present

## 2014-08-17 DIAGNOSIS — Z7982 Long term (current) use of aspirin: Secondary | ICD-10-CM | POA: Diagnosis not present

## 2014-08-17 DIAGNOSIS — D259 Leiomyoma of uterus, unspecified: Secondary | ICD-10-CM | POA: Diagnosis present

## 2014-08-17 DIAGNOSIS — N8320 Unspecified ovarian cysts: Secondary | ICD-10-CM | POA: Diagnosis present

## 2014-08-17 LAB — BASIC METABOLIC PANEL
Anion gap: 6 (ref 5–15)
BUN: 11 mg/dL (ref 6–20)
CALCIUM: 8.5 mg/dL — AB (ref 8.9–10.3)
CO2: 26 mmol/L (ref 22–32)
Chloride: 107 mmol/L (ref 101–111)
Creatinine, Ser: 0.77 mg/dL (ref 0.44–1.00)
GFR calc Af Amer: >60 mL/min (ref >60)
Glucose, Bld: 86 mg/dL (ref 65–99)
Potassium: 3.7 mmol/L (ref 3.5–5.1)
SODIUM: 139 mmol/L (ref 135–145)

## 2014-08-17 LAB — URINALYSIS, ROUTINE W REFLEX MICROSCOPIC
Bilirubin Urine: NEGATIVE
GLUCOSE, UA: NEGATIVE mg/dL
KETONES UR: NEGATIVE mg/dL
NITRITE: NEGATIVE
PROTEIN: NEGATIVE mg/dL
SPECIFIC GRAVITY, URINE: 1.006 (ref 1.005–1.030)
UROBILINOGEN UA: 0.2 mg/dL (ref 0.0–1.0)
pH: 6 (ref 5.0–8.0)

## 2014-08-17 LAB — CA 125: CA 125: 10 U/mL (ref 0.0–38.1)

## 2014-08-17 LAB — CBC
HCT: 30.7 % — ABNORMAL LOW (ref 36.0–46.0)
HEMOGLOBIN: 9.4 g/dL — AB (ref 12.0–15.0)
MCH: 25.1 pg — ABNORMAL LOW (ref 26.0–34.0)
MCHC: 30.6 g/dL (ref 30.0–36.0)
MCV: 81.9 fL (ref 78.0–100.0)
PLATELETS: 138 10*3/uL — AB (ref 150–400)
RBC: 3.75 MIL/uL — ABNORMAL LOW (ref 3.87–5.11)
RDW: 14.7 % (ref 11.5–15.5)
WBC: 5.3 10*3/uL (ref 4.0–10.5)

## 2014-08-17 LAB — URINE MICROSCOPIC-ADD ON

## 2014-08-17 MED ORDER — METOPROLOL SUCCINATE ER 25 MG PO TB24
25.0000 mg | ORAL_TABLET | Freq: Every day | ORAL | Status: DC
  Filled 2014-08-17 (×2): qty 1

## 2014-08-17 MED ORDER — FUROSEMIDE 20 MG PO TABS
20.0000 mg | ORAL_TABLET | Freq: Every day | ORAL | Status: DC
  Administered 2014-08-17 – 2014-08-18 (×2): 20 mg via ORAL
  Filled 2014-08-17 (×2): qty 1

## 2014-08-17 NOTE — Progress Notes (Signed)
In to see the patient and discharge her home if stable.  Patient stating she did not feel comfortable leaving today and she had just called her sister and told her not to come.  She stated she is concerned staying by herself and concerned about her BP being low even though she is asymptomatic.  Current BP 128/44 with pulse 63.  Dr. Delsa Sale notified of situation.  Patient to stay this afternoon for further BP monitoring and will be discharged in the am if stable.  Patient advised that she would be discharged in the am if stable so to make arrangements for her sister to stay with her if she would like.  She does not feel she would not need additional assistance for home needs.  UA reviewed.

## 2014-08-17 NOTE — Telephone Encounter (Signed)
Called Joylene John, NP with Dr. Hassell Done advisement.

## 2014-08-17 NOTE — Progress Notes (Signed)
1 Day Post-Op Procedure(s) (LRB): ROBOTIC ASSISTED TOTAL HYSTERECTOMY WITH BILATERAL SALPINGO OOPHORECTOMY  (Bilateral)  Subjective: Patient reports doing well this am.  Tolerated french toast this am with no nausea or emesis.  Mild sore throat reported from intubation.  Ambulating with assistance with no dizziness or weakness.  Pain minimal and described as mild soreness.  Reports feeling "gas bubble rolling around" in her abdomen with no flatus or bowel movement post-op.  Reporting average blood pressure around 110s/70s and reports her "bottom number has never been that low."  Denies chest pain, dyspnea.  Stating she will have someone staying with her tonight and tomorrow if she is discharged today.  Wanted to make sure she would be fine staying by herself in several days.       Objective: Vital signs in last 24 hours: Temp:  [97.6 F (36.4 C)-98.3 F (36.8 C)] 97.7 F (36.5 C) (06/01 0631) Pulse Rate:  [58-90] 90 (06/01 0631) Resp:  [10-18] 16 (06/01 0631) BP: (87-121)/(38-73) 94/51 mmHg (06/01 0631) SpO2:  [98 %-100 %] 98 % (06/01 0631) Last BM Date: 08/15/14  Intake/Output from previous day: 05/31 0701 - 06/01 0700 In: 2671.7 [P.O.:120; I.V.:2551.7] Out: 1435 [Urine:1350; Emesis/NG output:60; Blood:25]  Physical Examination: General: alert, cooperative and no distress Resp: clear to auscultation bilaterally Cardio: regular rate and rhythm, S1, S2 normal, no murmur, click, rub or gallop GI: incision: lap sites with dermabond without erythema or drainage and abdomen soft, tympanic on percussion, active bowel sounds Extremities: extremities normal, atraumatic, no cyanosis or edema  Labs: WBC/Hgb/Hct/Plts:  5.3/9.4/30.7/138 (06/01 0505) BUN/Cr/glu/ALT/AST/amyl/lip:  11/0.77/--/--/--/--/-- (06/01 0505)  Assessment: 74 y.o. s/p Procedure(s): ROBOTIC ASSISTED TOTAL HYSTERECTOMY WITH BILATERAL SALPINGO OOPHORECTOMY : stable Pain:  Pain is well-controlled on PRN medications.  Heme:  Hgb 9.4 and Hct 30.7 this am.  Stable post-operatively.  CV:  Hypotensive at times but asymptomatic.  Metoprolol to be given this afternoon if BP stable per Dr. Delsa Sale.  Currently on Plavix and aspirin.  GI:  Tolerating po: Yes     GU: Adequate output reported.    FEN: Stable post-operatively.  Prophylaxis: SCDs.  Plavix and aspirin resumed.  Plan: Saline lock IV UA to check specific gravity for possible bolus if dehydrated per Dr. Delsa Sale Continue to monitor BP and HR Plan for discharge this afternoon Encourage ambulation, IS use, deep breathing, and coughing Continue post-operative plan of care The patient is to be discharged to home   LOS: 1 day    CROSS, MELISSA DEAL 08/17/2014, 10:32 AM

## 2014-08-17 NOTE — Telephone Encounter (Signed)
OK to stay off plavix for now and continue aspirin.

## 2014-08-17 NOTE — Telephone Encounter (Signed)
Spoke with Joylene John, NP with GYN. Pt's Plavix was d/c'ed during an ED visit back in April due to vaginal bleeding pt was having. Pt was seen by Richardson Dopp, PA-C for surgical clearance on 5/13 and noted that pt needed to be on atleast one antiplatelet. Pt has resumed ASA at this time but Joylene John, NP wanted to know if pt needed to go back on Plavix or if ok to leave her off since she hasn't been on it for a little bit? NP states that blood work today showed Hemoglobin 9.4, Hematocrit 30.7 and Platelets 138. Will forward to Dr. Irish Lack for review and advisement.

## 2014-08-17 NOTE — Telephone Encounter (Signed)
New message      Pt had hysterectomy on tues.  Can she restart plavix?  She has already restarted aspirin.

## 2014-08-18 ENCOUNTER — Telehealth: Payer: Self-pay | Admitting: Physician Assistant

## 2014-08-18 MED ORDER — OXYCODONE-ACETAMINOPHEN 5-325 MG PO TABS: 1.0000 | ORAL_TABLET | ORAL | Status: DC | PRN

## 2014-08-18 NOTE — Telephone Encounter (Signed)
lmom that my call last week was just going over the lab results with pt. If any questions can cb [phone].

## 2014-08-18 NOTE — Progress Notes (Signed)
Reviewed d/c instructions with pt including medications, follow-up appointment, incision care, and precautions.  Pt verbalized understanding of all topics discussed.  Pt being d/c to home into care of sister.

## 2014-08-18 NOTE — Discharge Summary (Signed)
Physician Discharge Summary  Patient ID: [name] MRN: [id number] DOB/AGE: 09-26-1940 74 y.o.  Admit date: 08/16/2014 Discharge date: 08/18/2014  Admission Diagnoses: Post-menopausal bleeding  Discharge Diagnoses:  Principal Problem:   Post-menopausal bleeding Active Problems:   Vaginal bleeding   Postmenopausal bleeding   Discharged Condition:  The patient is in good condition and stable for discharge.    Hospital Course: On 08/16/2014, the patient underwent the following: Procedure(s): ROBOTIC ASSISTED TOTAL HYSTERECTOMY WITH BILATERAL SALPINGO OOPHORECTOMY.  The postoperative course was uneventful.  She was discharged to home on postoperative day 2 tolerating a regular diet, passing flatus, with minimal pain.  She is to continue aspirin 81 mg and not resume plavix at discharge per Dr. Irish Lack with Cardiology.  Consults: None  Significant Diagnostic Studies: None  Treatments: surgery: see above  Discharge Exam: Blood pressure 134/50, pulse 66, temperature 98.7 F (37.1 C), temperature source Oral, resp. rate 16, height 5\' 6"  (1.676 m), weight 196 lb (88.905 kg), SpO2 99 %. General appearance: alert, cooperative and no distress Resp: clear to auscultation bilaterally Cardio: regular rate and rhythm, S1, S2 normal, no murmur, click, rub or gallop GI: soft, non-tender; bowel sounds normal; no masses,  no organomegaly Extremities: extremities normal, atraumatic, no cyanosis or edema Incision/Wound: Lap sites with dermabond without drainage or erythema  Disposition: 01-Home or Self Care      Discharge Instructions    Call MD for:  difficulty breathing, headache or visual disturbances    Complete by:  As directed      Call MD for:  extreme fatigue    Complete by:  As directed      Call MD for:  hives    Complete by:  As directed      Call MD for:  persistant dizziness or light-headedness    Complete by:  As directed      Call MD for:  persistant nausea and  vomiting    Complete by:  As directed      Call MD for:  redness, tenderness, or signs of infection (pain, swelling, redness, odor or green/yellow discharge around incision site)    Complete by:  As directed      Call MD for:  severe uncontrolled pain    Complete by:  As directed      Call MD for:  temperature >100.4    Complete by:  As directed      Diet - low sodium heart healthy    Complete by:  As directed      Driving Restrictions    Complete by:  As directed   No driving for 1 week.  Do not take narcotics and drive.     Increase activity slowly    Complete by:  As directed      Lifting restrictions    Complete by:  As directed   No lifting greater than 10 lbs.     Sexual Activity Restrictions    Complete by:  As directed   No sexual activity, nothing in the vagina, for 8 weeks.            Medication List    STOP taking these medications        clopidogrel 75 MG tablet  Commonly known as:  PLAVIX     medroxyPROGESTERone 10 MG tablet  Commonly known as:  PROVERA      TAKE these medications        aspirin EC 81 MG tablet  Take 81 mg  by mouth every morning.     atorvastatin 80 MG tablet  Commonly known as:  LIPITOR  Take 40 mg by mouth daily at 6 PM.     Calcium + D3 600-200 MG-UNIT Tabs  Take 1 tablet by mouth every morning.     diclofenac sodium 1 % Gel  Commonly known as:  VOLTAREN  Apply 2 g topically 2 (two) times daily. Applies to knees     furosemide 20 MG tablet  Commonly known as:  LASIX  Take 1 tablet (20 mg total) by mouth daily.     iron polysaccharides 150 MG capsule  Commonly known as:  NIFEREX  Take 150 mg by mouth 2 (two) times daily.     levothyroxine 100 MCG tablet  Commonly known as:  SYNTHROID, LEVOTHROID  Take 100 mcg by mouth daily before breakfast.     metoprolol succinate 25 MG 24 hr tablet  Commonly known as:  TOPROL-XL  Take 25 mg by mouth every morning.     metroNIDAZOLE 0.75 % gel  Commonly known as:  METROGEL  Apply  1 application topically daily. Patient places on her cheeks and nose, only about once or twice a week depending on the climate.     nitroGLYCERIN 0.4 MG SL tablet  Commonly known as:  NITROSTAT  Place 0.4 mg under the tongue every 5 (five) minutes as needed for chest pain.     omega-3 acid ethyl esters 1 G capsule  Commonly known as:  LOVAZA  Take 1 g by mouth every morning.     oxyCODONE-acetaminophen 5-325 MG per tablet  Commonly known as:  PERCOCET/ROXICET  Take 1-2 tablets by mouth every 4 (four) hours as needed (moderate to severe pain).     pantoprazole 40 MG tablet  Commonly known as:  PROTONIX  Take 1 tablet (40 mg total) by mouth daily.     potassium chloride SA 20 MEQ tablet  Commonly known as:  K-DUR,KLOR-CON  Take 10 mEq by mouth every morning.       Follow-up Information    Follow up with [name], MD On 08/29/2014.   Specialty:  Obstetrics and Gynecology   Why:  at 9:30am at the [facility] information:   [address] [address] [phone]       Greater than thirty minutes were spend for face to face discharge instructions and discharge orders/summary in EPIC.   Signed: Theodis Kinsel DEAL 08/18/2014, 10:48 AM

## 2014-08-18 NOTE — Telephone Encounter (Signed)
New message     Sister calling back to speak with nurse from last week.

## 2014-08-18 NOTE — Progress Notes (Signed)
Contacted Dr. Hassell Done office with Cardiology in inquire if the patient would need to restart plavix since she had a hysterectomy and no longer has vaginal bleeding.  Patient is to continue with baby aspirin and not resume taking plavix per Dr. Irish Lack.

## 2014-08-18 NOTE — Discharge Instructions (Addendum)
08/18/2014  Return to work: 4-6 weeks if applicable  Activity: 1. Be up and out of the bed during the day.  Take a nap if needed.  You may walk up steps but be careful and use the hand rail.  Stair climbing will tire you more than you think, you may need to stop part way and rest.   2. No lifting or straining for 6 weeks.  3. No driving for 1 week(s).  Do not drive if you are taking narcotic pain medicine.  4. Shower daily.  Use soap and water on your incision and pat dry; don't rub.  No tub baths until cleared by your surgeon.   5. No sexual activity and nothing in the vagina for 8 weeks.  Diet: 1. Low sodium Heart Healthy Diet is recommended.  2. It is safe to use a laxative, such as Miralax or Colace, if you have difficulty moving your bowels.   Wound Care: 1. Keep clean and dry.  Shower daily.  Reasons to call the Doctor:  Fever - Oral temperature greater than 100.4 degrees Fahrenheit  Foul-smelling vaginal discharge  Difficulty urinating  Nausea and vomiting  Increased pain at the site of the incision that is unrelieved with pain medicine.  Difficulty breathing with or without chest pain  New calf pain especially if only on one side  Sudden, continuing increased vaginal bleeding with or without clots.   Contacts: For questions or concerns you should contact:   Dr. Everitt Amber at [phone]  Joylene John, NP at [phone]  After Hours: call [phone] and ask for the GYN Oncologist on call  Abdominal Hysterectomy, Care After These instructions give you information on caring for yourself after your procedure. Your doctor may also give you more specific instructions. Call your doctor if you have any problems or questions after your procedure.  HOME CARE It takes 4-6 weeks to recover from this surgery. Follow all of your doctor's instructions.   Only take medicines as told by your doctor.  Change your bandage as told by your doctor.  Return to your  doctor to have your stitches taken out.  Take showers for 2-3 weeks. Ask your doctor when it is okay to shower.  Do not douche, use tampons, or have sex (intercourse) for at least 6 weeks or as told.  Follow your doctor's advice about exercise, lifting objects, driving, and general activities.  Get plenty of rest and sleep.  Do not lift anything heavier than a gallon of milk (about 10 pounds [4.5 kilograms]) for the first month after surgery.  Get back to your normal diet as told by your doctor.  Do not drink alcohol until your doctor says it is okay.  Take a medicine to help you poop (laxative) as told by your doctor.  Eating foods high in fiber may help you poop. Eat a lot of raw fruits and vegetables, whole grains, and beans.  Drink enough fluids to keep your pee (urine) clear or pale yellow.  Have someone help you at home for 1-2 weeks after your surgery.  Keep follow-up doctor visits as told. GET HELP IF:  You have chills or fever.  You have puffiness, redness, or pain in area of the cut (incision).  You have yellowish-white fluid (pus) coming from the cut.  You have a bad smell coming from the cut or bandage.  Your cut pulls apart.  You feel dizzy or light-headed.  You have pain or bleeding when you pee.  You  keep having watery poop (diarrhea).  You keep feeling sick to your stomach (nauseous) or keep throwing up (vomiting).  You have fluid (discharge) coming from your vagina.  You have a rash.  You have a reaction to your medicine.  You need stronger pain medicine. GET HELP RIGHT AWAY IF:   You have a fever and your symptoms suddenly get worse.  You have bad belly (abdominal) pain.  You have chest pain.  You are short of breath.  You pass out (faint).  You have pain, puffiness, or redness of your leg.  You bleed a lot from your vagina and notice clumps of tissue (clots). MAKE SURE YOU:   Understand these instructions.  Will watch your  condition.  Will get help right away if you are not doing well or get worse. Document Released: 12/12/2007 Document Revised: 03/09/2013 Document Reviewed: 12/25/2012 [facility] Patient Information 2015 [place], [place]. This information is not intended to replace advice given to you by your health care provider. Make sure you discuss any questions you have with your health care provider.   Acetaminophen; Oxycodone tablets What is this medicine? ACETAMINOPHEN; OXYCODONE (a set a MEE noe fen; ox i KOE done) is a pain reliever. It is used to treat mild to moderate pain. This medicine may be used for other purposes; ask your health care provider or pharmacist if you have questions. COMMON BRAND NAME(S): Endocet, Magnacet, Narvox, Percocet, Perloxx, Primalev, Primlev, Roxicet, Xolox What should I tell my health care provider before I take this medicine? They need to know if you have any of these conditions: -brain tumor -Crohn's disease, inflammatory bowel disease, or ulcerative colitis -drug abuse or addiction -head injury -heart or circulation problems -if you often drink alcohol -kidney disease or problems going to the bathroom -liver disease -lung disease, asthma, or breathing problems -an unusual or allergic reaction to acetaminophen, oxycodone, other opioid analgesics, other medicines, foods, dyes, or preservatives -pregnant or trying to get pregnant -breast-feeding How should I use this medicine? Take this medicine by mouth with a full glass of water. Follow the directions on the prescription label. Take your medicine at regular intervals. Do not take your medicine more often than directed. Talk to your pediatrician regarding the use of this medicine in children. Special care may be needed. Patients over 38 years old may have a stronger reaction and need a smaller dose. Overdosage: If you think you have taken too much of this medicine contact a poison control center or emergency room at  once. NOTE: This medicine is only for you. Do not share this medicine with others. What if I miss a dose? If you miss a dose, take it as soon as you can. If it is almost time for your next dose, take only that dose. Do not take double or extra doses. What may interact with this medicine? -alcohol -antihistamines -barbiturates like amobarbital, butalbital, butabarbital, methohexital, pentobarbital, phenobarbital, thiopental, and secobarbital -benztropine -drugs for bladder problems like solifenacin, trospium, oxybutynin, tolterodine, hyoscyamine, and methscopolamine -drugs for breathing problems like ipratropium and tiotropium -drugs for certain stomach or intestine problems like propantheline, homatropine methylbromide, glycopyrrolate, atropine, belladonna, and dicyclomine -general anesthetics like etomidate, ketamine, nitrous oxide, propofol, desflurane, enflurane, halothane, isoflurane, and sevoflurane -medicines for depression, anxiety, or psychotic disturbances -medicines for sleep -muscle relaxants -naltrexone -narcotic medicines (opiates) for pain -phenothiazines like perphenazine, thioridazine, chlorpromazine, mesoridazine, fluphenazine, prochlorperazine, promazine, and trifluoperazine -scopolamine -tramadol -trihexyphenidyl This list may not describe all possible interactions. Give your health care provider a  list of all the medicines, herbs, non-prescription drugs, or dietary supplements you use. Also tell them if you smoke, drink alcohol, or use illegal drugs. Some items may interact with your medicine. What should I watch for while using this medicine? Tell your doctor or health care professional if your pain does not go away, if it gets worse, or if you have new or a different type of pain. You may develop tolerance to the medicine. Tolerance means that you will need a higher dose of the medication for pain relief. Tolerance is normal and is expected if you take this medicine for  a long time. Do not suddenly stop taking your medicine because you may develop a severe reaction. Your body becomes used to the medicine. This does NOT mean you are addicted. Addiction is a behavior related to getting and using a drug for a non-medical reason. If you have pain, you have a medical reason to take pain medicine. Your doctor will tell you how much medicine to take. If your doctor wants you to stop the medicine, the dose will be slowly lowered over time to avoid any side effects. You may get drowsy or dizzy. Do not drive, use machinery, or do anything that needs mental alertness until you know how this medicine affects you. Do not stand or sit up quickly, especially if you are an older patient. This reduces the risk of dizzy or fainting spells. Alcohol may interfere with the effect of this medicine. Avoid alcoholic drinks. There are different types of narcotic medicines (opiates) for pain. If you take more than one type at the same time, you may have more side effects. Give your health care provider a list of all medicines you use. Your doctor will tell you how much medicine to take. Do not take more medicine than directed. Call emergency for help if you have problems breathing. The medicine will cause constipation. Try to have a bowel movement at least every 2 to 3 days. If you do not have a bowel movement for 3 days, call your doctor or health care professional. Do not take Tylenol (acetaminophen) or medicines that have acetaminophen with this medicine. Too much acetaminophen can be very dangerous. Many nonprescription medicines contain acetaminophen. Always read the labels carefully to avoid taking more acetaminophen. What side effects may I notice from receiving this medicine? Side effects that you should report to your doctor or health care professional as soon as possible: -allergic reactions like skin rash, itching or hives, swelling of the face, lips, or tongue -breathing difficulties,  wheezing -confusion -light headedness or fainting spells -severe stomach pain -unusually weak or tired -yellowing of the skin or the whites of the eyes Side effects that usually do not require medical attention (report to your doctor or health care professional if they continue or are bothersome): -dizziness -drowsiness -nausea -vomiting This list may not describe all possible side effects. Call your doctor for medical advice about side effects. You may report side effects to FDA at 1-800-FDA-1088. Where should I keep my medicine? Keep out of the reach of children. This medicine can be abused. Keep your medicine in a safe place to protect it from theft. Do not share this medicine with anyone. Selling or giving away this medicine is dangerous and against the law. Store at room temperature between 20 and 25 degrees C (68 and 77 degrees F). Keep container tightly closed. Protect from light. This medicine may cause accidental overdose and death if it is taken by  other adults, children, or pets. Flush any unused medicine down the toilet to reduce the chance of harm. Do not use the medicine after the expiration date. NOTE: This sheet is a summary. It may not cover all possible information. If you have questions about this medicine, talk to your doctor, pharmacist, or health care provider.  2015, [employer]. (2012-10-26 13:17:35)

## 2014-08-19 ENCOUNTER — Encounter (HOSPITAL_COMMUNITY): Payer: Self-pay

## 2014-08-19 ENCOUNTER — Telehealth: Payer: Self-pay | Admitting: *Deleted

## 2014-08-19 ENCOUNTER — Telehealth: Payer: Self-pay | Admitting: Physician Assistant

## 2014-08-19 NOTE — Telephone Encounter (Signed)
Called patient to see how she is doing since being discharged from the hospital yesterday. Patient states she is tolerating a regular diet and denies any nausea or vomiting. Patient urinating and passing gas without any difficultly. Pt has not had BM yet - she plans to start taking Colace later today. Minimal pain - pt states she is only taking tylenol and has not needed the Percocet yet. While on the phone, post-op appt with Dr. [name] on 08/29/14 verified with patient. Patient notified to call our office prior to this appt with any questions or concerns - patient agreeable to this and very appreciative of the call.

## 2014-08-19 NOTE — Telephone Encounter (Signed)
S/w pt's sister Caren Griffins) due to pt is hearing impaired. Sister states pt d/c from hospital yesterday and was advised to ask cariology if pt needs to continue metoprolol due diastolic pressure has been low. I d/w Dr. Irish Lack who reviewed all information. Dr. Irish Lack has advised for pt to continue metoprolol however to decrease to 12.5 mg once a day. Sister Caren Griffins who has been advised of recommendations per Dr. Irish Lack. She will let pt know of this recommendation. Med list changed today to reflect metoprolol dose change.

## 2014-08-19 NOTE — Telephone Encounter (Signed)
Follow up      Sister calling back from yesterday message.

## 2014-08-22 ENCOUNTER — Encounter (HOSPITAL_COMMUNITY): Payer: Self-pay

## 2014-08-23 ENCOUNTER — Telehealth: Payer: Self-pay | Admitting: Physician Assistant

## 2014-08-23 NOTE — Telephone Encounter (Signed)
New message      Pt c/o medication issue:  1. Name of Medication: metoprolol 2. How are you currently taking this medication (dosage and times per day)?  Not sure 3. Are you having a reaction (difficulty breathing--STAT)? Pt is sob with exertion when on medication 4. What is your medication issue? Pt did not have sob when she was off rx and she had more energy

## 2014-08-24 ENCOUNTER — Encounter (HOSPITAL_COMMUNITY): Payer: Self-pay

## 2014-08-25 NOTE — Telephone Encounter (Signed)
Follow Up      Pt's sister following up from previous call. Please call back and advise.

## 2014-08-26 ENCOUNTER — Encounter (HOSPITAL_COMMUNITY): Payer: Self-pay

## 2014-08-26 NOTE — Telephone Encounter (Signed)
I cb to discuss concern about metoprolol issue.

## 2014-08-26 NOTE — Telephone Encounter (Signed)
° ° ° °  Pt sister Allen Norris is calling back returning call about her sister.  Please call her back.

## 2014-08-28 ENCOUNTER — Other Ambulatory Visit: Payer: Self-pay | Admitting: Interventional Cardiology

## 2014-08-29 ENCOUNTER — Other Ambulatory Visit (HOSPITAL_BASED_OUTPATIENT_CLINIC_OR_DEPARTMENT_OTHER): Payer: Medicare Other

## 2014-08-29 ENCOUNTER — Ambulatory Visit: Payer: Medicare Other | Attending: Gynecologic Oncology | Admitting: Gynecologic Oncology

## 2014-08-29 ENCOUNTER — Encounter (HOSPITAL_COMMUNITY): Payer: Self-pay

## 2014-08-29 ENCOUNTER — Encounter: Payer: Self-pay | Admitting: Gynecologic Oncology

## 2014-08-29 VITALS — BP 134/70 | HR 66 | Temp 98.0°F | Resp 18 | Ht 66.0 in | Wt 189.5 lb

## 2014-08-29 DIAGNOSIS — R1909 Other intra-abdominal and pelvic swelling, mass and lump: Secondary | ICD-10-CM | POA: Diagnosis present

## 2014-08-29 DIAGNOSIS — D270 Benign neoplasm of right ovary: Secondary | ICD-10-CM | POA: Diagnosis not present

## 2014-08-29 DIAGNOSIS — D271 Benign neoplasm of left ovary: Secondary | ICD-10-CM

## 2014-08-29 DIAGNOSIS — N939 Abnormal uterine and vaginal bleeding, unspecified: Secondary | ICD-10-CM | POA: Diagnosis not present

## 2014-08-29 DIAGNOSIS — N840 Polyp of corpus uteri: Secondary | ICD-10-CM

## 2014-08-29 DIAGNOSIS — N9489 Other specified conditions associated with female genital organs and menstrual cycle: Secondary | ICD-10-CM

## 2014-08-29 LAB — CBC
HEMATOCRIT: 37.9 % (ref 34.8–46.6)
HEMOGLOBIN: 11.8 g/dL (ref 11.6–15.9)
MCH: 24.4 pg — ABNORMAL LOW (ref 25.1–34.0)
MCHC: 31.1 g/dL — AB (ref 31.5–36.0)
MCV: 78.5 fL — AB (ref 79.5–101.0)
Platelets: 214 10*3/uL (ref 145–400)
RBC: 4.83 10*6/uL (ref 3.70–5.45)
RDW: 14.8 % — ABNORMAL HIGH (ref 11.2–14.5)
WBC: 4.8 10*3/uL (ref 3.9–10.3)

## 2014-08-29 NOTE — Telephone Encounter (Signed)
Caren Griffins states that pt wants to see if Brynda Rim. PA will ok to have BMET 6/17 when she has her FLP/LFT. I advised did not think it would be a problem, though I will have to d/w PA and get order from him. Caren Griffins stated that pt wants bmet so she can see if she can stop the lasix and K+. I explained to Caren Griffins to let he sister know that she may have to be on lasix and K+ supp for a while. She said she will explain it to her.

## 2014-08-29 NOTE — Telephone Encounter (Signed)
I s/w Caren Griffins; pt's sister and caregiver. She states that pt is c/o DOE ever since she has been on metoprolol. We have decreased metoprolol to 12.5 mg daily though pt is still having DOE. Sister wants to know if there is something else pt can take. Advised I will d/w Brynda Rim. PA for further recommendations and I will call back later. Sister said thank you for our help.

## 2014-08-29 NOTE — Patient Instructions (Signed)
Followup with your primary care doctor and cardiologist as previously scheduled.   You do not need to schedule a followup with Dr. [name] but please call us in the future with any questions or concerns.

## 2014-08-29 NOTE — Telephone Encounter (Signed)
Doubt Metoprolol is the cause. She can hold the Metoprolol for 1 week and see how she does. If dyspnea improves, let us know.  We can try a different beta-blocker to see if she can tolerate it. Ask her if weight has increased or if she is having any LE edema. Richardson Dopp, PA-C   08/29/2014 3:39 PM

## 2014-08-29 NOTE — Progress Notes (Signed)
POSTOPERATIVE FOLLOWUP VISIT Assessment:    74 y.o. year old with postmenopausal bleeding from benign endometrial polyps.   S/p robotic assisted total laparoscopic hysterectomy, BSO on 08/16/14.   Plan: 1) Pathology reports reviewed today 2) Treatment counseling - she requires no specific followup for this benign condition. She should followup with her cardiologist regarding her questions about plavix and Toprol. She was given the opportunity to ask questions, which were answered to her satisfaction, and she is agreement with the above mentioned plan of care. 3)  Return to clinic on a prn basis only.  HPI:  [name] is a 74 y.o. year old No obstetric history on file. initially seen in consultation on 07/04/14 for postmenopausal bleeding and a uterine and bilateral ovarian masses. Her vaginal bleeding had necessitated stopping her plavix and aspirin (which was prescribed after coronary stenting for an MI).  She then underwent a robotic hysterectomy, BSO on 9/79/89 without complications.  Her postoperative course was uncomplicated.  Her final pathology revealed benign endometrial polyps and benign serous cystadenomas of the ovaries.  She is seen today for a postoperative check and to discuss her pathology results and ongoing plan.  Since discharge from the hospital, she is feeling fatigued but no pain.  She has improving appetite, normal bowel and bladder function, and pain controlled with minimal PO medication. She feels worse on her Toprol than off and is wondering if she can stop this, though she was advised to take half a tablet. She is back on ASA but not Plavix.    Review of systems: Constitutional:  She has no weight gain or weight loss. She has no fever or chills. Eyes: No blurred vision Ears, Nose, Mouth, Throat: No dizziness, headaches or changes in hearing. No mouth sores. Cardiovascular: No chest pain, palpitations or edema. Respiratory:  No shortness of breath, wheezing or  cough Gastrointestinal: She has normal bowel movements without diarrhea or constipation. She denies any nausea or vomiting. She denies blood in her stool or heart burn. Genitourinary:  She denies pelvic pain, pelvic pressure or changes in her urinary function. She has no hematuria, dysuria, or incontinence. She has no irregular vaginal bleeding or vaginal discharge Musculoskeletal: Denies muscle weakness or joint pains.  Skin:  She has no skin changes, rashes or itching Neurological:  Denies dizziness or headaches. No neuropathy, no numbness or tingling. Psychiatric:  She denies depression or anxiety. Hematologic/Lymphatic:   No easy bruising or bleeding   Physical Exam: Blood pressure 134/70, pulse 66, temperature 98 F (36.7 C), temperature source Oral, resp. rate 18, height 5\' 6"  (1.676 m), weight 189 lb 8 oz (85.957 kg), SpO2 100 %. General: Well dressed, well nourished in no apparent distress.   HEENT:  Normocephalic and atraumatic, no lesions.  Extraocular muscles intact. Sclerae anicteric. Pupils equal, round, reactive. No mouth sores or ulcers. Thyroid is normal size, not nodular, midline. Skin:  No lesions or rashes. Breasts:  deferred Lungs:  No increased work of breathing Cardiovascular:  Well perfused extremities Abdomen:  Soft, nontender, nondistended.  No palpable masses.  No hepatosplenomegaly.  No ascites. Normal bowel sounds.  No hernias.  Incisions are well healed Genitourinary: Normal EGBUS  Vaginal cuff intact.  No bleeding or discharge.  No cul de sac fullness. Extremities: No cyanosis, clubbing or edema.  No calf tenderness or erythema. No palpable cords. Psychiatric: Mood and affect are appropriate. Neurological: Awake, alert and oriented x 3. Sensation is intact, no neuropathy.  Musculoskeletal: No pain, normal strength  and range of motion.  [name], MD

## 2014-08-29 NOTE — Telephone Encounter (Signed)
I s/w Caren Griffins pt's sister and care giver DPR on file. Caren Griffins notified to have pt hold metoprolol x 1 week, let us know if breathing improves off the metoprolol, if so we can try a different beta blocker. Sister denies any edema, states wt has gone down.

## 2014-08-30 ENCOUNTER — Other Ambulatory Visit: Payer: Self-pay | Admitting: *Deleted

## 2014-08-30 ENCOUNTER — Telehealth: Payer: Self-pay | Admitting: *Deleted

## 2014-08-30 DIAGNOSIS — E785 Hyperlipidemia, unspecified: Secondary | ICD-10-CM

## 2014-08-30 DIAGNOSIS — I25119 Atherosclerotic heart disease of native coronary artery with unspecified angina pectoris: Secondary | ICD-10-CM

## 2014-08-30 DIAGNOSIS — I10 Essential (primary) hypertension: Secondary | ICD-10-CM

## 2014-08-30 NOTE — Telephone Encounter (Signed)
s/w sister Caren Griffins due to pt has hearing problems ; DPR on file. Caren Griffins advised ok per Brynda Rim. PA to add on bmet, bnp to lab 6/17 with her FLP/LFT.

## 2014-08-31 ENCOUNTER — Encounter (HOSPITAL_COMMUNITY): Payer: Self-pay

## 2014-09-01 ENCOUNTER — Telehealth: Payer: Self-pay | Admitting: *Deleted

## 2014-09-01 NOTE — Telephone Encounter (Signed)
Left message to call back  

## 2014-09-01 NOTE — Telephone Encounter (Signed)
-----   Message from Jettie Booze, MD sent at 09/01/2014  8:22 AM EDT ----- Would continue Toprol at this time.  She can stop aspirin and resume Plavix 75 mg daily.  THanks.  JV ----- Message -----    From: Everitt Amber, MD    Sent: 08/29/2014  10:07 AM      To: Jettie Booze, MD  Dear Dr Irish Lack, Ms Epstein had an uncomplicated hysterectomy, BSO and is doing well. We are rechecking her CBC to ensure her anemia is correcting on oral iron. She is back on aspirin, but not Plavix (should she be on both)? She is taking half a Toprol, but feels "bad on it with shortness of breath, fatigue and horseness". I told her it was important to take after her MI, but would check with you if there is an alternative medication?  Warm regards, Everitt Amber

## 2014-09-02 ENCOUNTER — Encounter (HOSPITAL_COMMUNITY): Payer: Self-pay

## 2014-09-02 ENCOUNTER — Other Ambulatory Visit (INDEPENDENT_AMBULATORY_CARE_PROVIDER_SITE_OTHER): Payer: Medicare Other | Admitting: *Deleted

## 2014-09-02 DIAGNOSIS — I10 Essential (primary) hypertension: Secondary | ICD-10-CM

## 2014-09-02 DIAGNOSIS — I25119 Atherosclerotic heart disease of native coronary artery with unspecified angina pectoris: Secondary | ICD-10-CM

## 2014-09-02 LAB — BASIC METABOLIC PANEL
BUN: 12 mg/dL (ref 6–23)
CALCIUM: 9.7 mg/dL (ref 8.4–10.5)
CHLORIDE: 103 meq/L (ref 96–112)
CO2: 29 mEq/L (ref 19–32)
CREATININE: 0.77 mg/dL (ref 0.40–1.20)
GFR: 78.34 mL/min (ref >60.00)
Glucose, Bld: 95 mg/dL (ref 70–99)
Potassium: 3.9 mEq/L (ref 3.5–5.1)
Sodium: 136 mEq/L (ref 135–145)

## 2014-09-02 LAB — BRAIN NATRIURETIC PEPTIDE: PRO B NATRI PEPTIDE: 150 pg/mL — AB (ref 0.0–100.0)

## 2014-09-02 NOTE — Telephone Encounter (Signed)
I am ok with this plan  

## 2014-09-02 NOTE — Telephone Encounter (Signed)
Pt in office today with sister and states that they were told by Richardson Dopp, PA-C and Arbie Cookey, CMA to hold Toprol for a week and to call and let them know if SOB sx resolved. Sister plans to call next Thurs or Fri with this information. Pt states that Nicki Reaper had also told her that it was ok to stop the Plavix and just continue ASA 81mg . Pt would like to stay off of Plavix if she can because she fears she will have more issues with hemorrhaging and low HgB. Informed pt that I would route this information to Dr. Irish Lack for review and advisement.  Both parties were in agreement.

## 2014-09-02 NOTE — Telephone Encounter (Signed)
Spoke with pt's sister, Caren Griffins and informed her that Dr. Irish Lack said that he was ok with this plan. Informed Caren Griffins to call our office as planned and let us know if sx improve.

## 2014-09-05 ENCOUNTER — Encounter (HOSPITAL_COMMUNITY): Payer: Self-pay

## 2014-09-07 ENCOUNTER — Encounter (HOSPITAL_COMMUNITY): Payer: Self-pay

## 2014-09-07 ENCOUNTER — Telehealth: Payer: Self-pay | Admitting: *Deleted

## 2014-09-07 NOTE — Telephone Encounter (Signed)
Pt's sister Glynda Jaeger DPR on file; Pt HOH. Caren Griffins notified of lab results. She also stated pt's breathing is doing better since holding the Toprol. I asked for he to please have pt monitor BP and call Friday with some readings. Caren Griffins said ok.

## 2014-09-07 NOTE — Telephone Encounter (Signed)
Caren Griffins aware Brynda Rim. PA ok with pt staying off toprol. pt advised to make sure to monitor Bp and call Friday 6/24 with readings.

## 2014-09-09 ENCOUNTER — Telehealth: Payer: Self-pay | Admitting: Physician Assistant

## 2014-09-09 ENCOUNTER — Encounter (HOSPITAL_COMMUNITY): Payer: Self-pay

## 2014-09-09 NOTE — Telephone Encounter (Signed)
Follow up     Returning Carol's call.  Please advise

## 2014-09-09 NOTE — Telephone Encounter (Signed)
New message     Pt daughter calling back with the information regarding heart rate and blood pressure.  Please advise

## 2014-09-12 ENCOUNTER — Telehealth: Payer: Self-pay | Admitting: Physician Assistant

## 2014-09-12 ENCOUNTER — Encounter (HOSPITAL_COMMUNITY): Payer: Self-pay

## 2014-09-12 NOTE — Telephone Encounter (Signed)
F/u   REturning [name];s phone call.

## 2014-09-12 NOTE — Telephone Encounter (Signed)
New message    Returning Carol's call.  Please advise

## 2014-09-12 NOTE — Telephone Encounter (Signed)
s/w pt's sister due to pt HOH about pt's BP readings; Caren Griffins states BP on 6/23 AM @ 7:30 119/59 HR 61; 8:15 AM 138/69 HR 58; 6/24 7:30 AM 141/68 before any meds. I advised cont monitor BP and let me know Wed. Caren Griffins said ok and thank you for our help.

## 2014-09-12 NOTE — Telephone Encounter (Signed)
lmptcb on both home and cell # to go ver BP readings.

## 2014-09-14 ENCOUNTER — Encounter (HOSPITAL_COMMUNITY): Payer: Self-pay

## 2014-09-14 ENCOUNTER — Encounter: Payer: Self-pay | Admitting: Physician Assistant

## 2014-09-26 ENCOUNTER — Other Ambulatory Visit: Payer: Self-pay

## 2014-09-26 ENCOUNTER — Other Ambulatory Visit: Payer: Self-pay | Admitting: Interventional Cardiology

## 2014-09-26 MED ORDER — PANTOPRAZOLE SODIUM 40 MG PO TBEC: 40.0000 mg | DELAYED_RELEASE_TABLET | Freq: Every day | ORAL | Status: DC

## 2014-09-28 ENCOUNTER — Encounter (HOSPITAL_COMMUNITY): Payer: Self-pay | Admitting: Emergency Medicine

## 2014-09-28 ENCOUNTER — Other Ambulatory Visit: Payer: Self-pay

## 2014-09-28 ENCOUNTER — Telehealth (HOSPITAL_COMMUNITY): Payer: Self-pay | Admitting: *Deleted

## 2014-09-28 ENCOUNTER — Encounter (HOSPITAL_COMMUNITY)
Admission: RE | Admit: 2014-09-28 | Discharge: 2014-09-28 | Disposition: A | Discharge status: Home / Self Care | Payer: Self-pay | Source: Ambulatory Visit | Attending: Interventional Cardiology | Admitting: Interventional Cardiology

## 2014-09-28 ENCOUNTER — Telehealth: Payer: Self-pay | Admitting: Cardiology

## 2014-09-28 ENCOUNTER — Emergency Department (HOSPITAL_COMMUNITY)
Admission: EM | Admit: 2014-09-28 | Discharge: 2014-09-28 | Disposition: A | Discharge status: Home / Self Care | Payer: Medicare Other | Attending: Emergency Medicine | Admitting: Emergency Medicine

## 2014-09-28 ENCOUNTER — Emergency Department (HOSPITAL_COMMUNITY): Payer: Medicare Other

## 2014-09-28 DIAGNOSIS — Z79899 Other long term (current) drug therapy: Secondary | ICD-10-CM | POA: Insufficient documentation

## 2014-09-28 DIAGNOSIS — E669 Obesity, unspecified: Secondary | ICD-10-CM | POA: Diagnosis not present

## 2014-09-28 DIAGNOSIS — E860 Dehydration: Secondary | ICD-10-CM | POA: Diagnosis not present

## 2014-09-28 DIAGNOSIS — M1990 Unspecified osteoarthritis, unspecified site: Secondary | ICD-10-CM | POA: Diagnosis not present

## 2014-09-28 DIAGNOSIS — H9190 Unspecified hearing loss, unspecified ear: Secondary | ICD-10-CM | POA: Insufficient documentation

## 2014-09-28 DIAGNOSIS — I252 Old myocardial infarction: Secondary | ICD-10-CM | POA: Insufficient documentation

## 2014-09-28 DIAGNOSIS — Z87891 Personal history of nicotine dependence: Secondary | ICD-10-CM | POA: Diagnosis not present

## 2014-09-28 DIAGNOSIS — Z955 Presence of coronary angioplasty implant and graft: Secondary | ICD-10-CM | POA: Insufficient documentation

## 2014-09-28 DIAGNOSIS — K219 Gastro-esophageal reflux disease without esophagitis: Secondary | ICD-10-CM | POA: Diagnosis not present

## 2014-09-28 DIAGNOSIS — Z7982 Long term (current) use of aspirin: Secondary | ICD-10-CM | POA: Diagnosis not present

## 2014-09-28 DIAGNOSIS — I2510 Atherosclerotic heart disease of native coronary artery without angina pectoris: Secondary | ICD-10-CM | POA: Insufficient documentation

## 2014-09-28 DIAGNOSIS — Z48812 Encounter for surgical aftercare following surgery on the circulatory system: Secondary | ICD-10-CM | POA: Insufficient documentation

## 2014-09-28 DIAGNOSIS — E785 Hyperlipidemia, unspecified: Secondary | ICD-10-CM | POA: Diagnosis not present

## 2014-09-28 DIAGNOSIS — R000 Tachycardia, unspecified: Secondary | ICD-10-CM | POA: Diagnosis not present

## 2014-09-28 DIAGNOSIS — I10 Essential (primary) hypertension: Secondary | ICD-10-CM | POA: Insufficient documentation

## 2014-09-28 DIAGNOSIS — R0602 Shortness of breath: Secondary | ICD-10-CM | POA: Diagnosis not present

## 2014-09-28 DIAGNOSIS — R7989 Other specified abnormal findings of blood chemistry: Secondary | ICD-10-CM

## 2014-09-28 DIAGNOSIS — E039 Hypothyroidism, unspecified: Secondary | ICD-10-CM | POA: Diagnosis not present

## 2014-09-28 LAB — CBC
HCT: 41.2 % (ref 36.0–46.0)
Hemoglobin: 12.9 g/dL (ref 12.0–15.0)
MCH: 24 pg — AB (ref 26.0–34.0)
MCHC: 31.3 g/dL (ref 30.0–36.0)
MCV: 76.7 fL — ABNORMAL LOW (ref 78.0–100.0)
PLATELETS: 231 10*3/uL (ref 150–400)
RBC: 5.37 MIL/uL — ABNORMAL HIGH (ref 3.87–5.11)
RDW: 16.3 % — AB (ref 11.5–15.5)
WBC: 9.1 10*3/uL (ref 4.0–10.5)

## 2014-09-28 LAB — BASIC METABOLIC PANEL
ANION GAP: 11 (ref 5–15)
BUN: 14 mg/dL (ref 6–20)
CO2: 23 mmol/L (ref 22–32)
Calcium: 9.5 mg/dL (ref 8.9–10.3)
Chloride: 104 mmol/L (ref 101–111)
Creatinine, Ser: 1.03 mg/dL — ABNORMAL HIGH (ref 0.44–1.00)
GFR calc Af Amer: >60 mL/min (ref >60)
GFR calc non Af Amer: 53 mL/min — ABNORMAL LOW (ref >60)
Glucose, Bld: 163 mg/dL — ABNORMAL HIGH (ref 65–99)
POTASSIUM: 3.3 mmol/L — AB (ref 3.5–5.1)
Sodium: 138 mmol/L (ref 135–145)

## 2014-09-28 LAB — D-DIMER, QUANTITATIVE (NOT AT ARMC): D DIMER QUANT: 0.37 ug{FEU}/mL (ref 0.00–0.48)

## 2014-09-28 MED ORDER — SODIUM CHLORIDE 0.9 % IV BOLUS (SEPSIS)
1000.0000 mL | Freq: Once | INTRAVENOUS | Status: AC
  Administered 2014-09-28: 1000 mL via INTRAVENOUS

## 2014-09-28 MED ORDER — POTASSIUM CHLORIDE CRYS ER 20 MEQ PO TBCR
40.0000 meq | EXTENDED_RELEASE_TABLET | Freq: Two times a day (BID) | ORAL | Status: DC
  Administered 2014-09-28: 40 meq via ORAL
  Filled 2014-09-28: qty 2

## 2014-09-28 NOTE — Telephone Encounter (Signed)
Pt asked for her sister to be called regarding transfer to ER for further evaluation of elevated heart rate.  Pt sister is on her way back from [facility]. She will come to the ER when she arrives to [place].  Thanked Government social research officer for call. Cherre Huger, BSN

## 2014-09-28 NOTE — Progress Notes (Signed)
Patient is here for her first day at cardiac rehab maintenance.  [name]'s heart rate was noted at 115-128. Patient placed on the Zoll rhtyhm Sinus Tach 118-120's  Blood  Pressure 114/80.  [name] has not been to cardiac rehab maintenance since April of this year. Patient reports that she continues to feel short of breath since her Stemi/Stent last year. Oxygen saturation 99% on room air. [name] reports that her betablocker was discontinued in June due to the patients complaints of shortness of breath. Kimberly Kicks FNP-C called and notified. [name]  That the patient be evaluated in the [facility] clinic today. No appointments were available this afternoon. The patients heart rate kept climbing into the 130's Sinus Tach. Advised that the patient be taken to the ED for evaluation. The ED was called and notified. [name] heart rate went up to the 140's, 150's and then 160. Rapid response RN called to assist in transporting the patient to the ED. Patient transported to the ED via stretcher on the Zoll on 2l/min of oxygen. Blood pressure before transport 140/100. Patient reported feeling a little lightheaded before transport to the ED. Patient remained awake and alert. Report given to ED RN and MD. Will notify the patient's sister about today's events. [name] did not exercise today at cardiac rehab maintenance.

## 2014-09-28 NOTE — Telephone Encounter (Signed)
Pt at cardiac rehab and she complained of SOB which is more of a chronic problem but she was found to tachycardic up to 148 while sitting.  She did feel lightheaded.  She is being taken to the ER for further eval.  She recently had hysterectomy followed by bleeding.  No chest pain.  Concern for  Anemia, though last Hgb was stable.  She is off BB due to SOB.  But with new tachycardia needs eval.

## 2014-09-28 NOTE — ED Notes (Signed)
Vital sign data that showed continuous monitoring were deleted when patient was transferred to another room (in [facility]) by accident.   Patient was continuously monitored during her entire visit and had no runs of SVT.

## 2014-09-28 NOTE — ED Provider Notes (Signed)
CSN: [id number]     Arrival date & time 09/28/14  1436 History   First MD Initiated Contact with Patient 09/28/14 1444     Chief Complaint  Patient presents with  . Tachycardia    not SVT     (Consider location/radiation/quality/duration/timing/severity/associated sxs/prior Treatment) Patient is a 74 y.o. female presenting with palpitations.  Palpitations Palpitations quality:  Fast (pt does not feel palpitations, but sent from cardiac rehab for tachycardia) Onset quality:  Unable to specify Timing:  Unable to specify Chronicity:  New Context: caffeine and dehydration (possible, urinating from lasix, taking lasix every day since June (used to take once weekly or as needed))   Context: not exercise (walked into cardiac rehab, had not started rehab yet) and not illicit drugs   Relieved by:  Nothing Worsened by:  Nothing Ineffective treatments:  None tried Associated symptoms: shortness of breath (chronic since MI over one year ago, undergoing outpt work up, no significant change)   Associated symptoms: no back pain, no chest pain, no cough, no leg pain, no lower extremity edema, no nausea, no numbness, no vomiting and no weakness   Risk factors: no hx of DVT, no hx of PE and no hyperthyroidism     Past Medical History  Diagnosis Date  . IV Contrast Allergy   . GERD (gastroesophageal reflux disease)   . Hypothyroidism   . Rosacea   . Hearing loss     a. s/p cochlear implant on right, hearing aid on left.  . Obesity   . Fibroids     a. uterine - spotting noted since 03/11/2013.  Marland Kitchen CAD (coronary artery disease)     a. 03/2013 Infpost STEMI/PCI: LM nl, LAD min irregs, LCX 100 (3.0x12 Vision BMS), RCA  mild ostial dzs, EF 55%.  . Essential hypertension, benign   . Ventricular tachycardia     a. Immediate post pci/MI - no complications, on BB.  Marland Kitchen Hyperlipidemia   . Myocardial infarction   . PONV (postoperative nausea and vomiting)   . Arthritis     needs left knee replacement   . Hernia of anterior abdominal wall    Past Surgical History  Procedure Laterality Date  . Cochlear implant    . Upper gastrointestinal endoscopy    . Cervical polypectomy      2010  . Cholecystectomy    . Uterine polyp      had D and C done to remove the polyp  . Left heart catheterization with coronary angiogram N/A 03/25/2013    Procedure: LEFT HEART CATHETERIZATION WITH CORONARY ANGIOGRAM;  Surgeon: Burnell Blanks, MD;  Location: [facility] CATH LAB;  Service: Cardiovascular;  Laterality: N/A;  . Robotic assisted total hysterectomy with bilateral salpingo oopherectomy Bilateral 08/16/2014    Procedure: ROBOTIC ASSISTED TOTAL HYSTERECTOMY WITH BILATERAL SALPINGO OOPHORECTOMY ;  Surgeon: Everitt Amber, MD;  Location: [facility] ORS;  Service: Gynecology;  Laterality: Bilateral;   Family History  Problem Relation Age of Onset  . Esophageal cancer Neg Hx   . Stomach cancer Neg Hx   . Colon cancer Cousin   . Heart failure Mother     died in her 78's.  . Cancer Mother   . Diabetes Mother   . Heart disease Mother   . Hypertension Mother   . Other Father     died in WWII  . Hyperlipidemia Sister   . Diabetes Sister   . Heart attack Mother   . Heart attack Maternal Grandmother   . Stroke Neg  Hx    History  Substance Use Topics  . Smoking status: Former Smoker -- 0.20 packs/day for 2 years    Types: Cigarettes    Quit date: 03/18/1968  . Smokeless tobacco: Not on file  . Alcohol Use: No   OB History    No data available     Review of Systems  Constitutional: Negative for fever.  HENT: Negative for sore throat.   Eyes: Negative for visual disturbance.  Respiratory: Positive for shortness of breath (chronic since MI over one year ago, undergoing outpt work up, no significant change). Negative for cough.   Cardiovascular: Positive for palpitations (tachycardia, does not feel palpitations). Negative for chest pain.  Gastrointestinal: Negative for nausea, vomiting and abdominal pain.   Genitourinary: Negative for difficulty urinating.  Musculoskeletal: Negative for back pain and neck pain.  Skin: Negative for rash.  Neurological: Negative for syncope, weakness, numbness and headaches.      Allergies  Bee venom; Iohexol; and Contrast media  Home Medications   Prior to Admission medications   Medication Sig Start Date End Date Taking? Authorizing Provider  aspirin EC 81 MG tablet Take 81 mg by mouth every morning.   Yes Historical Provider, MD  atorvastatin (LIPITOR) 80 MG tablet Take 40 mg by mouth daily at 6 PM.    Yes Historical Provider, MD  Calcium Carb-Cholecalciferol (CALCIUM + D3) 600-200 MG-UNIT TABS Take 1 tablet by mouth every morning.    Yes Historical Provider, MD  diclofenac sodium (VOLTAREN) 1 % GEL Apply 2 g topically 2 (two) times daily. Applies to knees   Yes Historical Provider, MD  furosemide (LASIX) 20 MG tablet TAKE 1 TABLET ONCE DAILY. 08/29/14  Yes Jettie Booze, MD  iron polysaccharides (NIFEREX) 150 MG capsule Take 150 mg by mouth 2 (two) times daily.   Yes Historical Provider, MD  levothyroxine (SYNTHROID, LEVOTHROID) 100 MCG tablet Take 100 mcg by mouth daily before breakfast.   Yes Historical Provider, MD  metroNIDAZOLE (METROGEL) 0.75 % gel Apply 1 application topically daily. Patient places on her cheeks and nose, only about once or twice a week depending on the climate.   Yes Historical Provider, MD  nitroGLYCERIN (NITROSTAT) 0.4 MG SL tablet Place 0.4 mg under the tongue every 5 (five) minutes as needed for chest pain.   Yes Historical Provider, MD  omega-3 acid ethyl esters (LOVAZA) 1 G capsule Take 1 g by mouth every morning.    Yes Historical Provider, MD  pantoprazole (PROTONIX) 40 MG tablet Take 1 tablet (40 mg total) by mouth daily. 09/26/14  Yes Jettie Booze, MD  potassium chloride SA (K-DUR,KLOR-CON) 20 MEQ tablet Take 10 mEq by mouth every morning.    Yes Historical Provider, MD   BP 169/69 mmHg  Pulse 91  Temp(Src)  99.3 F (37.4 C) (Oral)  Resp 18  Ht 5\' 6"  (1.676 m)  Wt 189 lb 8 oz (85.957 kg)  BMI 30.60 kg/m2  SpO2 100% Physical Exam  Constitutional: She is oriented to person, place, and time. She appears well-developed and well-nourished. No distress.  HENT:  Head: Normocephalic and atraumatic.  Mouth/Throat: No oropharyngeal exudate.  Eyes: Conjunctivae and EOM are normal.  Neck: Normal range of motion.  Cardiovascular: Regular rhythm, normal heart sounds and intact distal pulses.  Tachycardia present.  Exam reveals no gallop and no friction rub.   No murmur heard. Pulmonary/Chest: Effort normal and breath sounds normal. No respiratory distress. She has no wheezes. She has no rales.  Abdominal: Soft. She exhibits no distension. There is no tenderness. There is no guarding.  Musculoskeletal: She exhibits no edema or tenderness.  Neurological: She is alert and oriented to person, place, and time.  Skin: Skin is warm and dry. No rash noted. She is not diaphoretic. No erythema.  Nursing note and vitals reviewed.   ED Course  Procedures (including critical care time) Labs Review Labs Reviewed  BASIC METABOLIC PANEL - Abnormal; Notable for the following:    Potassium 3.3 (*)    Glucose, Bld 163 (*)    Creatinine, Ser 1.03 (*)    GFR calc non Af Amer 53 (*)    All other components within normal limits  CBC - Abnormal; Notable for the following:    RBC 5.37 (*)    MCV 76.7 (*)    MCH 24.0 (*)    RDW 16.3 (*)    All other components within normal limits  D-DIMER, QUANTITATIVE (NOT AT [facility])    Imaging Review Dg Chest Port 1 View  09/28/2014   CLINICAL DATA:  Tachycardia.  Shortness of breath.  EXAM: PORTABLE CHEST - 1 VIEW  COMPARISON:  06/25/2014.  FINDINGS: Cardiopericardial silhouette within normal limits. Mediastinal contours normal. Trachea midline. No airspace disease or effusion. Monitoring leads project over the chest.  IMPRESSION: No active disease.   Electronically Signed   By:  Dereck Ligas M.D.   On: 09/28/2014 15:15     EKG Interpretation   Date/Time:  Wednesday September 28 2014 14:40:18 EDT Ventricular Rate:  125 PR Interval:  127 QRS Duration: 96 QT Interval:  345 QTC Calculation: 497 R Axis:   -44 Text Interpretation:  Sinus tachycardia Inferior infarct, old SINCE LAST  TRACING HEART RATE HAS INCREASED Confirmed by [facility] MD, [place] ([id number])  on 09/28/2014 6:00:11 PM      MDM   Final diagnoses:  Sinus tachycardia  Dehydration  Elevated serum creatinine, from .53 to 68.67   74 year old female with a history of STEMI in January 2015 status post PCI, benign endometrial polyps causing hemorrhage resulting in hysterectomy in May 2016 presents from cardiac rehabilitation for concern of tachycardia.  Differential diagnosis for tachycardia includes anemia, electrolyte abnormalities, medication effects, dehydration, infection, PE.  EKG evaluated by me shows sinus tachycardia without acute ST changes.  Patient reports chronic shortness of breath since she's had her heart attack however denies any significant change. There are no signs of acute heart failure on exam, nor symptoms to suggest ACS. CXR evaluated by me without significant airspace disease or acute abnormalities.  Patient denies any infectious symptoms, is afebrile without any leukocytosis and doubt infectious cause of symptoms. Patient's hemoglobin has greatly increased from prior to 12.9. A d-dimer was performed given patient's tachycardia and history of surgery (although greater than 4wk previous). Patient is low risk [place] and had a negative d-dimer have low suspicion for pulmonary embolus. Patient is also mildly hypokalemic to 3.3 and was given 40 mEq of potassium.  Patient's creatinine is mildly elevated at 1.03 from 0.77 prior.  She is given a bolus of normal saline with improvement of her heart rate to the 90s. She reports that starting in June she's been taking her Lasix every day and decreasing  fluid intake, and given tachycardia/renal function feel dehydration from Lasix use is likely etiology.  In addition, pt discontinued metoprolol last month which may contribute.  Called Cardiology office and set up appointment for 2PM tomorrow to further discuss lasix/metoprolol.  Patient discharged in stable condition  with understanding of reasons to return.   Gareth Morgan, MD 09/28/14 [phone]

## 2014-09-28 NOTE — Progress Notes (Signed)
Cardiology Office Note   Date:  09/29/2014   ID:  TAIMI TOWE, DOB Dec 28, 1940, MRN [id number]  PCP:  [name], MD  Cardiologist:  Dr. Casandra Doffing     Chief Complaint  Patient presents with  . Hospitalization Follow-up    EF visit 09/28/14 for tachycardia     History of Present Illness: [name] is a 74 y.o. female with a hx of CAD status post inferoposterior STEMI 03/2013 treated with a BMS to the left circumflex, HL, HTN, IV dye allergy. Post PCI, she had recurrent nonsustained ventricular tachycardia. Relook cardiac catheterization demonstrated patent circumflex stent. Ejection fraction has been well-preserved. She has had persistent DOE since her MI without significant change.  Echo in 06/2013 demonstrated normal LVF.  PFTs were unremarkable last year.  She has seen pulmonology as well.   Admitted to the hospital in 06/2014 with acute blood loss anemia from profound vaginal bleeding. Hemoglobin dropped to 7.9. She was transfused with 1 unit of PRBCs. Antiplatelet agents were stopped.  She was DC and I saw her in May.  We cleared her for surgery. At the time I saw her in May, her BNP was mildly elevated and I placed her on low dose Lasix.  She ultimately underwent TAH/BSO with Dr. [name] 5/31.  Post op, Dr. Casandra Doffing recommended she remain on Plavix instead of ASA. She had concerns about bleeding with this and it was recommended she remain on ASA only.  She has called the office with SEs to the Toprol and this has been stopped.    She went to the ED with palpitations 7/13.  She was checking in with Cardiac Rehab when she was noted to be short of breath. She was resuming maintenance rehab after being out for her hysterectomy.   Creatinine was slightly elevated.  HR was 120 on ECG and she was in sinus tach.  She was given IVFs.    09/28/2014: Creatinine, Ser 1.03*; D-Dimer, Quant 0.37; Hemoglobin 12.9; Potassium 3.3*   She returns for FU.  Here with her daughter.   She continues to be very concerned about her shortness of breath.  She has not had and significant changes.  She felt somewhat worse when she was anemic.  Of note, she saw her orthopedist recently  She has severe bilateral knee DJD and needs a TKR on the L.  She gets out of breath with prolonged standing.  She gets out of breath with minimal activities. She is NYHA 2b-3.  She denies orthopnea, PND, significant edema.  She denies syncope. She denies chest pain.  She had no significant change in her symptoms with taking Lasix.  She does have a NP cough.  She does not have a hx of asthma, smoking or factory/mill work.     Studies/Reports Reviewed Today:  Dg Chest Port 1 View  09/28/2014     IMPRESSION: No active disease.   Electronically Signed   By: Dereck Ligas M.D.   On: 09/28/2014 15:15    LHC 03/25/13 Left main: Normal caliber, short segment. No obstructive disease. Left Anterior Descending Artery: Mild luminal irregularities in the mid vessel.  Circumflex Artery: Proximal stent is patent  Right Coronary Artery: 30% mid stenosis. The PDA and posterolateral branches are small in caliber.    LHC/PCI 03/24/13 The left main coronary artery is short, but widely patent. LAD: mild irregularities. There are several small to medium-sized diagonal vessels which are widely patent.  LCx: proximal vessel was occluded.  RCA: mild ostial disease. The posterior descending vessel is small but patent.  EF: 55% LVEDP 28 PCI: 3 x 12 mm vision BMS to the proximal circumflex  Echo 06/18/13 - Mild focal basal hypertrophy of the septum. EF 50% to 55%. Wall motion was normal;  - Left atrium: The atrium was moderately dilated. - Pulmonary arteries: PA peak pressure: 6mm Hg (S).   Past Medical History  Diagnosis Date  . IV Contrast Allergy   . GERD (gastroesophageal reflux disease)   . Hypothyroidism   . Rosacea   . Hearing loss     a. s/p cochlear implant on right, hearing aid on left.  .  Obesity   . Fibroids     a. uterine - spotting noted since 03/11/2013.  Marland Kitchen CAD (coronary artery disease)     a. 03/2013 Infpost STEMI/PCI: LM nl, LAD min irregs, LCX 100 (3.0x12 Vision BMS), RCA  mild ostial dzs, EF 55%.  . Essential hypertension, benign   . Ventricular tachycardia     a. Immediate post pci/MI - no complications, on BB.  Marland Kitchen Hyperlipidemia   . Myocardial infarction   . PONV (postoperative nausea and vomiting)   . Arthritis     needs left knee replacement  . Hernia of anterior abdominal wall     Past Surgical History  Procedure Laterality Date  . Cochlear implant    . Upper gastrointestinal endoscopy    . Cervical polypectomy      2010  . Cholecystectomy    . Uterine polyp      had D and C done to remove the polyp  . Left heart catheterization with coronary angiogram N/A 03/25/2013    Procedure: LEFT HEART CATHETERIZATION WITH CORONARY ANGIOGRAM;  Surgeon: Burnell Blanks, MD;  Location: [facility] CATH LAB;  Service: Cardiovascular;  Laterality: N/A;  . Robotic assisted total hysterectomy with bilateral salpingo oopherectomy Bilateral 08/16/2014    Procedure: ROBOTIC ASSISTED TOTAL HYSTERECTOMY WITH BILATERAL SALPINGO OOPHORECTOMY ;  Surgeon: Everitt Amber, MD;  Location: [facility] ORS;  Service: Gynecology;  Laterality: Bilateral;     Current Outpatient Prescriptions  Medication Sig Dispense Refill  . aspirin EC 81 MG tablet Take 81 mg by mouth daily.    Marland Kitchen atorvastatin (LIPITOR) 80 MG tablet Take 40 mg by mouth daily at 6 PM.     . Calcium Carb-Cholecalciferol (CALCIUM + D3) 600-200 MG-UNIT TABS Take 1 tablet by mouth every morning.     . diclofenac sodium (VOLTAREN) 1 % GEL Apply 2 g topically 2 (two) times daily. Applies to knees    . furosemide (LASIX) 20 MG tablet Take 1 tablet (20 mg total) by mouth 2 (two) times a week.    . iron polysaccharides (NIFEREX) 150 MG capsule Take 150 mg by mouth 2 (two) times daily.    Marland Kitchen levothyroxine (SYNTHROID, LEVOTHROID) 100 MCG tablet  Take 100 mcg by mouth daily before breakfast.    . metroNIDAZOLE (METROGEL) 0.75 % gel Apply 1 application topically daily. Patient places on her cheeks and nose, only about once or twice a week depending on the climate.    . nitroGLYCERIN (NITROSTAT) 0.4 MG SL tablet Place 0.4 mg under the tongue every 5 (five) minutes as needed for chest pain.    Marland Kitchen omega-3 acid ethyl esters (LOVAZA) 1 G capsule Take 1 g by mouth every morning.     . pantoprazole (PROTONIX) 40 MG tablet Take 1 tablet (40 mg total) by mouth daily. 30 tablet 6  . potassium  chloride SA (K-DUR,KLOR-CON) 20 MEQ tablet Take 20 mEq by mouth 2 (two) times a week.    . metoprolol succinate (TOPROL XL) 25 MG 24 hr tablet Take 1 tablet (25 mg total) by mouth daily.     No current facility-administered medications for this visit.    Allergies:   Bee venom; Contrast media; and Iohexol    Social History:  The patient  reports that she quit smoking about 46 years ago. Her smoking use included Cigarettes. She has a .4 pack-year smoking history. She does not have any smokeless tobacco history on file. She reports that she does not drink alcohol or use illicit drugs.   Family History:  The patient's family history includes Cancer in her mother; Colon cancer in her cousin; Diabetes in her mother and sister; Heart attack in her maternal grandmother and mother; Heart disease in her mother; Heart failure in her mother; Hyperlipidemia in her sister; Hypertension in her mother; Other in her father. There is no history of Esophageal cancer, Stomach cancer, or Stroke.    ROS:   Please see the history of present illness.   Review of Systems  Cardiovascular: Positive for dyspnea on exertion and leg swelling.  Psychiatric/Behavioral: The patient is nervous/anxious.   All other systems reviewed and are negative.     PHYSICAL EXAM: VS:  BP 154/82 mmHg  Pulse 85  Ht 5\' 6"  (1.676 m)  Wt 194 lb 6.4 oz (88.179 kg)  BMI 31.39 kg/m2   O2:  93% with  ambulation on RA  Wt Readings from Last 3 Encounters:  09/29/14 194 lb 6.4 oz (88.179 kg)  09/28/14 189 lb 8 oz (85.957 kg)  08/29/14 189 lb 8 oz (85.957 kg)     GEN: Well nourished, well developed, in no acute distress HEENT: normal Neck: no JVD, no masses Cardiac:  Normal S1/S2, RRR; no murmur ,  no rubs or gallops, no edema   Respiratory:  clear to auscultation bilaterally, no wheezing, rhonchi or rales. GI: soft, nontender, nondistended, + BS MS: no deformity or atrophy Skin: warm and dry  Neuro:  CNs II-XII intact, Strength and sensation are intact Psych: Normal affect   EKG:  EKG is ordered today.  It demonstrates:   NSR, HR 85, normal axis, no ST changes.   Recent Labs: 08/08/2014: ALT 18 09/02/2014: Pro B Natriuretic peptide (BNP) 150.0* 09/28/2014: BUN 14; Creatinine, Ser 1.03*; Hemoglobin 12.9; Platelets 231; Potassium 3.3*; Sodium 138    Lipid Panel    Component Value Date/Time   CHOL 114 09/01/2013 1009   TRIG 78.0 09/01/2013 1009   HDL 41.80 09/01/2013 1009   CHOLHDL 3 09/01/2013 1009   VLDL 15.6 09/01/2013 1009   LDLCALC 57 09/01/2013 1009      ASSESSMENT AND PLAN:  1.  Coronary artery disease involving native coronary artery of native heart without angina pectoris:   She is not having any angina. She continues to have difficulty with shortness of breath. Her ECG is unchanged. She was concerned about taking Plavix. Therefore she continued on ASA only. Continue aspirin, statin. Resume beta blocker as outlined below. 2.  SOB (shortness of breath):  The patient continues to be concerned about shortness of breath. She has had the same symptoms since her myocardial infarction in January 2015. Relook catheterization one day after her heart attack demonstrated patent stent and no significant obstructive disease elsewhere. She's had significant workup in the past. I spent greater than 45 minutes reviewing her chart and her symptoms  with her today and coordinating her  care. PFTs in the past were not significantly abnormal. She did see pulmonology without significant findings or further evaluation recommended. She has significant DJD. she will need L TKR.  She did have a mildly elevated BNP in the past. Diuresis did not really help her symptoms.  I had a long discussion with her and her daughter. I suspect that her symptoms are related to deconditioning more than anything else. We discussed the possibility of proceeding with cardiopulmonary stress testing. I am not convinced that she needs to pursue this at this time.  I think she should resume cardiac rehabilitation now.    -  Return to Cardiac Rehab Monday 10/03/14  -  I have suggested that she decrease Lasix to twice weekly along with potassium.   -  We will recheck her BMET next week to ensure that her potassium has improved.  -  I will arrange an echocardiogram to reassess her LV function and right-sided heart pressures.  -  If she does have significant improvement in her symptoms with continued rehab, plan on CPX testing.  -  I suspect her knee DJD will limit improvement in her symptoms somewhat. 3.  Sinus tachycardia:  Sinus tachycardia is likely in response to increased effort from deconditioning and shortness of breath. Tachycardia is likely making her dyspnea worse.  I believe that her heart rate is worse off of beta blocker therapy. I have suggested that she resume Toprol-XL 25 mg daily. Recent hemoglobin is improved back to normal. 4.  Essential hypertension:  Blood pressure elevated. Resume Toprol as noted. 5.  Hyperlipidemia:  Continue statin.    Medication Changes: Current medicines are reviewed at length with the patient today.  Concerns regarding medicines are as outlined above.  The following changes have been made:   Discontinued Medications   ASPIRIN EC 81 MG TABLET    Take 81 mg by mouth every morning.   METOPROLOL SUCCINATE (TOPROL-XL) 25 MG 24 HR TABLET       Modified Medications    Modified Medication Previous Medication   FUROSEMIDE (LASIX) 20 MG TABLET furosemide (LASIX) 20 MG tablet      Take 1 tablet (20 mg total) by mouth 2 (two) times a week.    TAKE 1 TABLET ONCE DAILY.   New Prescriptions   METOPROLOL SUCCINATE (TOPROL XL) 25 MG 24 HR TABLET    Take 1 tablet (25 mg total) by mouth daily.     Labs/ tests ordered today include:   Orders Placed This Encounter  Procedures  . Basic metabolic panel  . EKG 12-Lead  . Echocardiogram     Disposition:   FU with Dr. Casandra Doffing 6-8 weeks.    Signed, Versie Starks, MHS 09/29/2014 4:10 PM    [facility] [place], [place], [place]  [address] Phone: [phone]; Fax: [phone]

## 2014-09-28 NOTE — ED Notes (Signed)
Sent here from cardiac rehab via rapid response nurse and cardiac rehab nurses.  Here today for non-monitored rehab, was noted to have fast heart rate, at times around 160.  She had a STEMI last year, recently had hysterectomy about a month ago, had her beta blocker stopped about the same time.  On arrival, pt was noted to be in sinus tach, no cp or sob at the time but had endorsed having sob and dizziness earlier when her hr was faster.  Denies cp

## 2014-09-29 ENCOUNTER — Ambulatory Visit (INDEPENDENT_AMBULATORY_CARE_PROVIDER_SITE_OTHER): Payer: Medicare Other | Admitting: Physician Assistant

## 2014-09-29 ENCOUNTER — Encounter: Payer: Self-pay | Admitting: Physician Assistant

## 2014-09-29 VITALS — BP 154/82 | HR 85 | Ht 66.0 in | Wt 194.4 lb

## 2014-09-29 DIAGNOSIS — I471 Supraventricular tachycardia: Secondary | ICD-10-CM | POA: Diagnosis not present

## 2014-09-29 DIAGNOSIS — I2510 Atherosclerotic heart disease of native coronary artery without angina pectoris: Secondary | ICD-10-CM

## 2014-09-29 DIAGNOSIS — I2119 ST elevation (STEMI) myocardial infarction involving other coronary artery of inferior wall: Secondary | ICD-10-CM

## 2014-09-29 DIAGNOSIS — R0602 Shortness of breath: Secondary | ICD-10-CM

## 2014-09-29 DIAGNOSIS — R000 Tachycardia, unspecified: Secondary | ICD-10-CM

## 2014-09-29 DIAGNOSIS — I10 Essential (primary) hypertension: Secondary | ICD-10-CM

## 2014-09-29 DIAGNOSIS — E876 Hypokalemia: Secondary | ICD-10-CM | POA: Diagnosis not present

## 2014-09-29 DIAGNOSIS — E785 Hyperlipidemia, unspecified: Secondary | ICD-10-CM

## 2014-09-29 DIAGNOSIS — I25119 Atherosclerotic heart disease of native coronary artery with unspecified angina pectoris: Secondary | ICD-10-CM

## 2014-09-29 MED ORDER — FUROSEMIDE 20 MG PO TABS: 20.0000 mg | ORAL_TABLET | ORAL | Status: DC

## 2014-09-29 MED ORDER — METOPROLOL SUCCINATE ER 25 MG PO TB24: 25.0000 mg | ORAL_TABLET | Freq: Every day | ORAL | Status: DC

## 2014-09-29 NOTE — Patient Instructions (Signed)
Medication Instructions:  Your physician has recommended you make the following change in your medication:  1) RESUME Metoprolol  25mg  daily 2) DECREASE Lasix 20mg  and Potassium 33meq Twice a week    Labwork: Your physician recommends that you return for lab work on 10/03/14 (Bmet)   Testing/Procedures: Your physician has requested that you have an echocardiogram. Echocardiography is a painless test that uses sound waves to create images of your heart. It provides your doctor with information about the size and shape of your heart and how well your heart's chambers and valves are working. This procedure takes approximately one hour. There are no restrictions for this procedure.   Follow-Up: Your physician recommends that you schedule a follow-up appointment in: 6-8 weeks with Dr.Varanasi   Any Other Special Instructions Will Be Listed Below (If Applicable). Ok to return to Cardiac Rehab on Monday 10/03/14

## 2014-09-30 ENCOUNTER — Encounter (HOSPITAL_COMMUNITY): Admission: RE | Admit: 2014-09-30 | Payer: Self-pay | Source: Ambulatory Visit

## 2014-10-03 ENCOUNTER — Other Ambulatory Visit (INDEPENDENT_AMBULATORY_CARE_PROVIDER_SITE_OTHER): Payer: Medicare Other | Admitting: *Deleted

## 2014-10-03 ENCOUNTER — Encounter (HOSPITAL_COMMUNITY)
Admission: RE | Admit: 2014-10-03 | Discharge: 2014-10-03 | Disposition: A | Discharge status: Home / Self Care | Payer: Self-pay | Source: Ambulatory Visit | Attending: Interventional Cardiology | Admitting: Interventional Cardiology

## 2014-10-03 DIAGNOSIS — E876 Hypokalemia: Secondary | ICD-10-CM | POA: Diagnosis not present

## 2014-10-03 LAB — BASIC METABOLIC PANEL
BUN: 13 mg/dL (ref 6–23)
CALCIUM: 9.5 mg/dL (ref 8.4–10.5)
CO2: 27 meq/L (ref 19–32)
Chloride: 105 mEq/L (ref 96–112)
Creatinine, Ser: 0.75 mg/dL (ref 0.40–1.20)
GFR: 80.74 mL/min (ref >60.00)
GLUCOSE: 104 mg/dL — AB (ref 70–99)
Potassium: 4.2 mEq/L (ref 3.5–5.1)
SODIUM: 139 meq/L (ref 135–145)

## 2014-10-04 ENCOUNTER — Telehealth: Payer: Self-pay | Admitting: *Deleted

## 2014-10-04 NOTE — Telephone Encounter (Signed)
lmptcb on sister Cynthia's phone due to pt hearing impaired.

## 2014-10-05 ENCOUNTER — Other Ambulatory Visit: Payer: Self-pay

## 2014-10-05 ENCOUNTER — Ambulatory Visit (HOSPITAL_COMMUNITY): Payer: Medicare Other | Attending: Cardiovascular Disease

## 2014-10-05 ENCOUNTER — Encounter (HOSPITAL_COMMUNITY)
Admission: RE | Admit: 2014-10-05 | Discharge: 2014-10-05 | Disposition: A | Discharge status: Home / Self Care | Payer: Self-pay | Source: Ambulatory Visit | Attending: Interventional Cardiology | Admitting: Interventional Cardiology

## 2014-10-05 DIAGNOSIS — I517 Cardiomegaly: Secondary | ICD-10-CM | POA: Diagnosis not present

## 2014-10-05 DIAGNOSIS — I2510 Atherosclerotic heart disease of native coronary artery without angina pectoris: Secondary | ICD-10-CM

## 2014-10-05 DIAGNOSIS — E785 Hyperlipidemia, unspecified: Secondary | ICD-10-CM | POA: Diagnosis not present

## 2014-10-05 DIAGNOSIS — Z87891 Personal history of nicotine dependence: Secondary | ICD-10-CM | POA: Insufficient documentation

## 2014-10-05 DIAGNOSIS — I10 Essential (primary) hypertension: Secondary | ICD-10-CM | POA: Diagnosis not present

## 2014-10-05 DIAGNOSIS — Z8249 Family history of ischemic heart disease and other diseases of the circulatory system: Secondary | ICD-10-CM | POA: Diagnosis not present

## 2014-10-06 ENCOUNTER — Encounter: Payer: Self-pay | Admitting: Physician Assistant

## 2014-10-06 NOTE — Telephone Encounter (Signed)
lmom labs ok on pt's sister Cynthia's phone due tp hearing impaired. Results sent to [facility] as well.

## 2014-10-07 ENCOUNTER — Encounter (HOSPITAL_COMMUNITY)
Admission: RE | Admit: 2014-10-07 | Discharge: 2014-10-07 | Disposition: A | Discharge status: Home / Self Care | Payer: Self-pay | Source: Ambulatory Visit | Attending: Interventional Cardiology | Admitting: Interventional Cardiology

## 2014-10-10 ENCOUNTER — Encounter (HOSPITAL_COMMUNITY)
Admission: RE | Admit: 2014-10-10 | Discharge: 2014-10-10 | Disposition: A | Discharge status: Home / Self Care | Payer: Self-pay | Source: Ambulatory Visit | Attending: Interventional Cardiology | Admitting: Interventional Cardiology

## 2014-10-10 ENCOUNTER — Telehealth: Payer: Self-pay | Admitting: Interventional Cardiology

## 2014-10-10 ENCOUNTER — Other Ambulatory Visit: Payer: Self-pay | Admitting: *Deleted

## 2014-10-10 ENCOUNTER — Telehealth: Payer: Self-pay | Admitting: *Deleted

## 2014-10-10 DIAGNOSIS — R0609 Other forms of dyspnea: Secondary | ICD-10-CM

## 2014-10-10 DIAGNOSIS — I25119 Atherosclerotic heart disease of native coronary artery with unspecified angina pectoris: Secondary | ICD-10-CM

## 2014-10-10 MED ORDER — LISINOPRIL 2.5 MG PO TABS: 2.5000 mg | ORAL_TABLET | Freq: Every day | ORAL | Status: DC

## 2014-10-10 NOTE — Telephone Encounter (Signed)
LAB ORDER CHANGED TO [facility]

## 2014-10-10 NOTE — Telephone Encounter (Signed)
New message     Pt sister wanting to know if Echo results are in. Please call to discuss.

## 2014-10-10 NOTE — Telephone Encounter (Signed)
DPR for pt's sister Caren Griffins. Caren Griffins notified of echo results w/decreased EF from prior echo. Continue Toprol XL, start Lisinopril 2.5 mg to help EF. ETT Myoview scheduled for 8/3, bmet 8/3. Will send instructions out today to pt.

## 2014-10-10 NOTE — Telephone Encounter (Signed)
Informed patient's sister that we can not give her any information without patient's consent. Encouraged her to have patient fill out a DPR form. Patient's sister verbalized understanding.

## 2014-10-10 NOTE — Addendum Note (Signed)
Addended by: Michae Kava on: 10/10/2014 06:35 PM   Modules accepted: Orders

## 2014-10-11 DIAGNOSIS — D649 Anemia, unspecified: Secondary | ICD-10-CM | POA: Diagnosis not present

## 2014-10-11 DIAGNOSIS — N950 Postmenopausal bleeding: Secondary | ICD-10-CM | POA: Diagnosis not present

## 2014-10-12 ENCOUNTER — Encounter (HOSPITAL_COMMUNITY)
Admission: RE | Admit: 2014-10-12 | Discharge: 2014-10-12 | Disposition: A | Discharge status: Home / Self Care | Payer: Self-pay | Source: Ambulatory Visit | Attending: Interventional Cardiology | Admitting: Interventional Cardiology

## 2014-10-14 ENCOUNTER — Encounter (HOSPITAL_COMMUNITY)
Admission: RE | Admit: 2014-10-14 | Discharge: 2014-10-14 | Disposition: A | Discharge status: Home / Self Care | Payer: Self-pay | Source: Ambulatory Visit | Attending: Interventional Cardiology | Admitting: Interventional Cardiology

## 2014-10-14 ENCOUNTER — Telehealth (HOSPITAL_COMMUNITY): Payer: Self-pay

## 2014-10-14 NOTE — Telephone Encounter (Signed)
Encounter complete. 

## 2014-10-17 ENCOUNTER — Encounter (HOSPITAL_COMMUNITY)
Admission: RE | Admit: 2014-10-17 | Discharge: 2014-10-17 | Disposition: A | Discharge status: Home / Self Care | Payer: Self-pay | Source: Ambulatory Visit | Attending: Interventional Cardiology | Admitting: Interventional Cardiology

## 2014-10-17 DIAGNOSIS — Z48812 Encounter for surgical aftercare following surgery on the circulatory system: Secondary | ICD-10-CM | POA: Insufficient documentation

## 2014-10-17 DIAGNOSIS — I252 Old myocardial infarction: Secondary | ICD-10-CM | POA: Insufficient documentation

## 2014-10-17 DIAGNOSIS — Z955 Presence of coronary angioplasty implant and graft: Secondary | ICD-10-CM | POA: Insufficient documentation

## 2014-10-19 ENCOUNTER — Encounter: Payer: Self-pay | Admitting: Physician Assistant

## 2014-10-19 ENCOUNTER — Other Ambulatory Visit: Payer: Medicare Other

## 2014-10-19 ENCOUNTER — Encounter (HOSPITAL_COMMUNITY): Payer: Medicare Other

## 2014-10-19 ENCOUNTER — Telehealth: Payer: Self-pay | Admitting: Physician Assistant

## 2014-10-19 ENCOUNTER — Ambulatory Visit (HOSPITAL_COMMUNITY)
Admission: RE | Admit: 2014-10-19 | Discharge: 2014-10-19 | Disposition: A | Discharge status: Home / Self Care | Payer: Medicare Other | Source: Ambulatory Visit | Attending: Cardiovascular Disease | Admitting: Cardiovascular Disease

## 2014-10-19 ENCOUNTER — Encounter (HOSPITAL_COMMUNITY): Payer: Self-pay

## 2014-10-19 ENCOUNTER — Telehealth: Payer: Self-pay

## 2014-10-19 DIAGNOSIS — I209 Angina pectoris, unspecified: Secondary | ICD-10-CM | POA: Diagnosis not present

## 2014-10-19 DIAGNOSIS — I25119 Atherosclerotic heart disease of native coronary artery with unspecified angina pectoris: Secondary | ICD-10-CM

## 2014-10-19 DIAGNOSIS — R0609 Other forms of dyspnea: Secondary | ICD-10-CM

## 2014-10-19 DIAGNOSIS — R079 Chest pain, unspecified: Secondary | ICD-10-CM | POA: Diagnosis not present

## 2014-10-19 LAB — BASIC METABOLIC PANEL
BUN: 14 mg/dL (ref 7–25)
CO2: 25 mmol/L (ref 20–31)
Calcium: 9.4 mg/dL (ref 8.6–10.4)
Chloride: 101 mmol/L (ref 98–110)
Creat: 0.69 mg/dL (ref 0.60–0.93)
Glucose, Bld: 85 mg/dL (ref 65–99)
Potassium: 4.2 mmol/L (ref 3.5–5.3)
Sodium: 137 mmol/L (ref 135–146)

## 2014-10-19 LAB — MYOCARDIAL PERFUSION IMAGING
CHL CUP NUCLEAR SDS: 5
CHL CUP NUCLEAR SRS: 6
CSEPHR: 93 %
CSEPPHR: 139 {beats}/min
Estimated workload: 7 METS
Exercise duration (min): 5 min
Exercise duration (sec): 40 s
LV dias vol: 96 mL
LVSYSVOL: 40 mL
MPHR: 149 {beats}/min
NUC STRESS TID: 1.03
RPE: 14
Rest HR: 51 {beats}/min
SSS: 11

## 2014-10-19 MED ORDER — TECHNETIUM TC 99M SESTAMIBI GENERIC - CARDIOLITE
31.4000 | Freq: Once | INTRAVENOUS | Status: AC | PRN
  Administered 2014-10-19: 31 via INTRAVENOUS

## 2014-10-19 MED ORDER — TECHNETIUM TC 99M SESTAMIBI GENERIC - CARDIOLITE
10.1000 | Freq: Once | INTRAVENOUS | Status: AC | PRN
  Administered 2014-10-19: 10.1 via INTRAVENOUS

## 2014-10-19 NOTE — Telephone Encounter (Signed)
[facility] needs order for BMEt.  Had not sticky note but they did not find note in computer

## 2014-10-19 NOTE — Telephone Encounter (Signed)
An order was placed for BMET.  [facility] could not find the one placed on 7/25

## 2014-10-21 ENCOUNTER — Encounter: Payer: Self-pay | Admitting: Interventional Cardiology

## 2014-10-21 ENCOUNTER — Encounter (HOSPITAL_COMMUNITY)
Admission: RE | Admit: 2014-10-21 | Discharge: 2014-10-21 | Disposition: A | Discharge status: Home / Self Care | Payer: Self-pay | Source: Ambulatory Visit | Attending: Interventional Cardiology | Admitting: Interventional Cardiology

## 2014-10-24 ENCOUNTER — Encounter (HOSPITAL_COMMUNITY)
Admission: RE | Admit: 2014-10-24 | Discharge: 2014-10-24 | Disposition: A | Discharge status: Home / Self Care | Payer: Self-pay | Source: Ambulatory Visit | Attending: Interventional Cardiology | Admitting: Interventional Cardiology

## 2014-10-25 NOTE — Telephone Encounter (Signed)
This encounter can be closed

## 2014-10-26 ENCOUNTER — Encounter (HOSPITAL_COMMUNITY)
Admission: RE | Admit: 2014-10-26 | Discharge: 2014-10-26 | Disposition: A | Discharge status: Home / Self Care | Payer: Self-pay | Source: Ambulatory Visit | Attending: Interventional Cardiology | Admitting: Interventional Cardiology

## 2014-10-28 ENCOUNTER — Encounter (HOSPITAL_COMMUNITY)
Admission: RE | Admit: 2014-10-28 | Discharge: 2014-10-28 | Disposition: A | Discharge status: Home / Self Care | Payer: Self-pay | Source: Ambulatory Visit | Attending: Interventional Cardiology | Admitting: Interventional Cardiology

## 2014-10-31 ENCOUNTER — Encounter (HOSPITAL_COMMUNITY)
Admission: RE | Admit: 2014-10-31 | Discharge: 2014-10-31 | Disposition: A | Discharge status: Home / Self Care | Payer: Self-pay | Source: Ambulatory Visit | Attending: Interventional Cardiology | Admitting: Interventional Cardiology

## 2014-11-02 ENCOUNTER — Encounter (HOSPITAL_COMMUNITY)
Admission: RE | Admit: 2014-11-02 | Discharge: 2014-11-02 | Disposition: A | Discharge status: Home / Self Care | Payer: Self-pay | Source: Ambulatory Visit | Attending: Interventional Cardiology | Admitting: Interventional Cardiology

## 2014-11-04 ENCOUNTER — Encounter (HOSPITAL_COMMUNITY): Payer: Self-pay

## 2014-11-07 ENCOUNTER — Encounter (HOSPITAL_COMMUNITY)
Admission: RE | Admit: 2014-11-07 | Discharge: 2014-11-07 | Disposition: A | Discharge status: Home / Self Care | Payer: Self-pay | Source: Ambulatory Visit | Attending: Interventional Cardiology | Admitting: Interventional Cardiology

## 2014-11-09 ENCOUNTER — Encounter (HOSPITAL_COMMUNITY): Payer: Self-pay

## 2014-11-11 ENCOUNTER — Encounter (HOSPITAL_COMMUNITY)
Admission: RE | Admit: 2014-11-11 | Discharge: 2014-11-11 | Disposition: A | Discharge status: Home / Self Care | Payer: Self-pay | Source: Ambulatory Visit | Attending: Interventional Cardiology | Admitting: Interventional Cardiology

## 2014-11-14 ENCOUNTER — Encounter (HOSPITAL_COMMUNITY)
Admission: RE | Admit: 2014-11-14 | Discharge: 2014-11-14 | Disposition: A | Discharge status: Home / Self Care | Payer: Self-pay | Source: Ambulatory Visit | Attending: Interventional Cardiology | Admitting: Interventional Cardiology

## 2014-11-16 ENCOUNTER — Encounter (HOSPITAL_COMMUNITY): Payer: Self-pay

## 2014-11-18 ENCOUNTER — Encounter (HOSPITAL_COMMUNITY)
Admission: RE | Admit: 2014-11-18 | Discharge: 2014-11-18 | Disposition: A | Discharge status: Home / Self Care | Payer: Self-pay | Source: Ambulatory Visit | Attending: Interventional Cardiology | Admitting: Interventional Cardiology

## 2014-11-18 ENCOUNTER — Encounter: Payer: Self-pay | Admitting: Interventional Cardiology

## 2014-11-18 ENCOUNTER — Ambulatory Visit (INDEPENDENT_AMBULATORY_CARE_PROVIDER_SITE_OTHER): Payer: Medicare Other | Admitting: Interventional Cardiology

## 2014-11-18 VITALS — BP 128/62 | HR 54 | Ht 66.0 in | Wt 193.6 lb

## 2014-11-18 DIAGNOSIS — I10 Essential (primary) hypertension: Secondary | ICD-10-CM | POA: Diagnosis not present

## 2014-11-18 DIAGNOSIS — I25119 Atherosclerotic heart disease of native coronary artery with unspecified angina pectoris: Secondary | ICD-10-CM

## 2014-11-18 DIAGNOSIS — I2510 Atherosclerotic heart disease of native coronary artery without angina pectoris: Secondary | ICD-10-CM

## 2014-11-18 DIAGNOSIS — Z955 Presence of coronary angioplasty implant and graft: Secondary | ICD-10-CM | POA: Insufficient documentation

## 2014-11-18 DIAGNOSIS — I252 Old myocardial infarction: Secondary | ICD-10-CM | POA: Insufficient documentation

## 2014-11-18 DIAGNOSIS — Z48812 Encounter for surgical aftercare following surgery on the circulatory system: Secondary | ICD-10-CM | POA: Insufficient documentation

## 2014-11-18 NOTE — Patient Instructions (Signed)
Medication Instructions:  None  Labwork: None  Testing/Procedures: None  Follow-Up: Your physician wants you to follow-up in: 1 year with Dr. Irish Lack. You will receive a reminder letter in the mail two months in advance. If you don't receive a letter, please call our office to schedule the follow-up appointment.   Any Other Special Instructions Will Be Listed Below (If Applicable).  Please have your Primary Care Office fax over your lab results once they are complete. [phone]

## 2014-11-18 NOTE — Progress Notes (Signed)
Patient ID: [name], female   DOB: 03-10-1941, 74 y.o.   MRN: [id number]     Cardiology Office Note   Date:  11/22/2014   ID:  [name], DOB 1941-02-08, MRN [id number]  PCP:  Kimberly Lyons, MD    No chief complaint on file. CAD/inferior MI   Wt Readings from Last 3 Encounters:  11/18/14 193 lb 9.6 oz (87.816 kg)  10/19/14 194 lb (87.998 kg)  09/29/14 194 lb 6.4 oz (88.179 kg)       History of Present Illness: [name] is a 74 y.o. female  with a hx of CAD status post inferoposterior STEMI 03/2013 treated with a BMS to the left circumflex, HL, HTN, IV dye allergy. Post PCI, she had recurrent nonsustained ventricular tachycardia. Relook cardiac catheterization demonstrated patent circumflex stent. Ejection fraction has been well-preserved. She has had persistent DOE since her MI without significant change. Echo in 06/2013 demonstrated normal LVF. PFTs were unremarkable in 2015. She has seen pulmonology as well.   Admitted to the hospital in 06/2014 with acute blood loss anemia from profound vaginal bleeding. Hemoglobin dropped to 7.9. She was transfused with 1 unit of PRBCs. Antiplatelet agents were stopped.  Echo showed mildly decreased left ventricular function. She ultimately underwent a stress test due to decreased ejection fraction. No ischemia was found.  She has felt well. She denies any chest discomfort or shortness of breath. No bleeding issues now on the aspirin alone.     Past Medical History  Diagnosis Date  . IV Contrast Allergy   . GERD (gastroesophageal reflux disease)   . Hypothyroidism   . Rosacea   . Hearing loss     a. s/p cochlear implant on right, hearing aid on left.  . Obesity   . Fibroids     a. uterine - spotting noted since 03/11/2013.  Marland Kitchen CAD (coronary artery disease)     a. 03/2013 Infpost STEMI/PCI: LM nl, LAD min irregs, LCX 100 (3.0x12 Vision BMS), RCA  mild ostial dzs, EF 55%.  . Essential hypertension, benign   .  Ventricular tachycardia     a. Immediate post pci/MI - no complications, on BB.  Marland Kitchen Hyperlipidemia   . Ischemic cardiomyopathy     Echo 7/16: Septal and posterior lateral HK, EF 45-50%, mild LAE  . Arthritis     needs left knee replacement  . Hernia of anterior abdominal wall   . History of cardiovascular stress test     Lexiscan Myoview 8/16:  EF 58%, inferolateral scar, no ischemia; Low Risk    Past Surgical History  Procedure Laterality Date  . Cochlear implant    . Upper gastrointestinal endoscopy    . Cervical polypectomy      2010  . Cholecystectomy    . Uterine polyp      had D and C done to remove the polyp  . Left heart catheterization with coronary angiogram N/A 03/25/2013    Procedure: LEFT HEART CATHETERIZATION WITH CORONARY ANGIOGRAM;  Surgeon: Burnell Blanks, MD;  Location: [facility] CATH LAB;  Service: Cardiovascular;  Laterality: N/A;  . Robotic assisted total hysterectomy with bilateral salpingo oopherectomy Bilateral 08/16/2014    Procedure: ROBOTIC ASSISTED TOTAL HYSTERECTOMY WITH BILATERAL SALPINGO OOPHORECTOMY ;  Surgeon: Everitt Amber, MD;  Location: [facility] ORS;  Service: Gynecology;  Laterality: Bilateral;     Current Outpatient Prescriptions  Medication Sig Dispense Refill  . aspirin EC 81 MG tablet Take 81 mg by mouth daily.    Marland Kitchen  atorvastatin (LIPITOR) 80 MG tablet Take 40 mg by mouth daily at 6 PM.     . Calcium Carb-Cholecalciferol (CALCIUM + D3) 600-200 MG-UNIT TABS Take 1 tablet by mouth every morning.     . diclofenac sodium (VOLTAREN) 1 % GEL Apply 2 g topically 2 (two) times daily. Applies to knees    . furosemide (LASIX) 20 MG tablet Take 1 tablet (20 mg total) by mouth 2 (two) times a week.    . levothyroxine (SYNTHROID, LEVOTHROID) 100 MCG tablet Take 100 mcg by mouth daily before breakfast.    . lisinopril (PRINIVIL,ZESTRIL) 2.5 MG tablet Take 1 tablet (2.5 mg total) by mouth daily. 30 tablet 11  . metoprolol succinate (TOPROL XL) 25 MG 24 hr tablet  Take 1 tablet (25 mg total) by mouth daily.    . metroNIDAZOLE (METROGEL) 0.75 % gel Apply 1 application topically daily. Patient places on her cheeks and nose, only about once or twice a week depending on the climate.    . nitroGLYCERIN (NITROSTAT) 0.4 MG SL tablet Place 0.4 mg under the tongue every 5 (five) minutes as needed for chest pain.    Marland Kitchen omega-3 acid ethyl esters (LOVAZA) 1 G capsule Take 1 g by mouth every morning.     . pantoprazole (PROTONIX) 40 MG tablet Take 1 tablet (40 mg total) by mouth daily. 30 tablet 6  . potassium chloride SA (K-DUR,KLOR-CON) 20 MEQ tablet Take 20 mEq by mouth 2 (two) times a week.     No current facility-administered medications for this visit.    Allergies:   Bee venom; Contrast media; and Iohexol    Social History:  The patient  reports that she quit smoking about 46 years ago. Her smoking use included Cigarettes. She has a .4 pack-year smoking history. She does not have any smokeless tobacco history on file. She reports that she does not drink alcohol or use illicit drugs.   Family History:  The patient's family history includes Cancer in her mother; Colon cancer in her cousin; Diabetes in her mother and sister; Heart attack in her maternal grandmother and mother; Heart disease in her mother; Heart failure in her mother; Hyperlipidemia in her sister; Hypertension in her mother; Other in her father. There is no history of Esophageal cancer, Stomach cancer, or Stroke.    ROS:  Please see the history of present illness.   Otherwise, review of systems are positive for bleeding episode as noted above.   All other systems are reviewed and negative.    PHYSICAL EXAM: VS:  BP 128/62 mmHg  Pulse 54  Ht 5\' 6"  (1.676 m)  Wt 193 lb 9.6 oz (87.816 kg)  BMI 31.26 kg/m2  SpO2 96% , BMI Body mass index is 31.26 kg/(m^2). GEN: Well nourished, well developed, in no acute distress HEENT: normal Neck: no JVD, carotid bruits, or masses Cardiac: RRR; no murmurs,  rubs, or gallops,no edema  Respiratory:  clear to auscultation bilaterally, normal work of breathing GI: soft, nontender, nondistended, + BS MS: no deformity or atrophy Skin: warm and dry, no rash Neuro:  Strength and sensation are intact, hearing impaired Psych: euthymic mood, full affect    Recent Labs: 08/08/2014: ALT 18 09/02/2014: Pro B Natriuretic peptide (BNP) 150.0* 09/28/2014: Hemoglobin 12.9; Platelets 231 10/19/2014: BUN 14; Creat 0.69; Potassium 4.2; Sodium 137   Lipid Panel    Component Value Date/Time   CHOL 114 09/01/2013 1009   TRIG 78.0 09/01/2013 1009   HDL 41.80 09/01/2013 1009  CHOLHDL 3 09/01/2013 1009   VLDL 15.6 09/01/2013 1009   LDLCALC 57 09/01/2013 1009     Other studies Reviewed: Additional studies/ records that were reviewed today with results demonstrating: No ischemia by stress test.   ASSESSMENT AND PLAN:  1.  Prior inferior MI/CAD: Bare-metal stent used due to some uterine bleeding that she's had. SHe is already on aspirin and statin. No Plavix due to bleeding problems. 2. Hyperlipidemia: to be checked with Dr. Reynaldo Minium per her request. 3. HTN: CONtrolled. COntinue current meds.   Current medicines are reviewed at length with the patient today.  The patient concerns regarding her medicines were addressed.  The following changes have been made:  No change  Labs/ tests ordered today include:  No orders of the defined types were placed in this encounter.    Recommend 150 minutes/week of aerobic exercise Low fat, low carb, high fiber diet recommended  Disposition:   FU in 1 year   Teresita Madura., MD  11/22/2014 1:35 PM    [facility] [address] [place], [place], [place]  [address] Phone: [phone]; Fax: [phone]

## 2014-11-23 ENCOUNTER — Encounter (HOSPITAL_COMMUNITY)
Admission: RE | Admit: 2014-11-23 | Discharge: 2014-11-23 | Disposition: A | Discharge status: Home / Self Care | Payer: Self-pay | Source: Ambulatory Visit | Attending: Interventional Cardiology | Admitting: Interventional Cardiology

## 2014-11-25 ENCOUNTER — Encounter (HOSPITAL_COMMUNITY)
Admission: RE | Admit: 2014-11-25 | Discharge: 2014-11-25 | Disposition: A | Discharge status: Home / Self Care | Payer: Self-pay | Source: Ambulatory Visit | Attending: Interventional Cardiology | Admitting: Interventional Cardiology

## 2014-11-27 ENCOUNTER — Other Ambulatory Visit: Payer: Self-pay | Admitting: Interventional Cardiology

## 2014-11-28 ENCOUNTER — Encounter (HOSPITAL_COMMUNITY)
Admission: RE | Admit: 2014-11-28 | Discharge: 2014-11-28 | Disposition: A | Discharge status: Home / Self Care | Payer: Self-pay | Source: Ambulatory Visit | Attending: Interventional Cardiology | Admitting: Interventional Cardiology

## 2014-11-30 ENCOUNTER — Encounter (HOSPITAL_COMMUNITY)
Admission: RE | Admit: 2014-11-30 | Discharge: 2014-11-30 | Disposition: A | Discharge status: Home / Self Care | Payer: Self-pay | Source: Ambulatory Visit | Attending: Interventional Cardiology | Admitting: Interventional Cardiology

## 2014-12-02 ENCOUNTER — Encounter (HOSPITAL_COMMUNITY)
Admission: RE | Admit: 2014-12-02 | Discharge: 2014-12-02 | Disposition: A | Discharge status: Home / Self Care | Payer: Self-pay | Source: Ambulatory Visit | Attending: Interventional Cardiology | Admitting: Interventional Cardiology

## 2014-12-05 ENCOUNTER — Encounter (HOSPITAL_COMMUNITY)
Admission: RE | Admit: 2014-12-05 | Discharge: 2014-12-05 | Disposition: A | Discharge status: Home / Self Care | Payer: Self-pay | Source: Ambulatory Visit | Attending: Interventional Cardiology | Admitting: Interventional Cardiology

## 2014-12-07 ENCOUNTER — Encounter (HOSPITAL_COMMUNITY)
Admission: RE | Admit: 2014-12-07 | Discharge: 2014-12-07 | Disposition: A | Discharge status: Home / Self Care | Payer: Self-pay | Source: Ambulatory Visit | Attending: Interventional Cardiology | Admitting: Interventional Cardiology

## 2014-12-09 ENCOUNTER — Encounter (HOSPITAL_COMMUNITY)
Admission: RE | Admit: 2014-12-09 | Discharge: 2014-12-09 | Disposition: A | Discharge status: Home / Self Care | Payer: Self-pay | Source: Ambulatory Visit | Attending: Interventional Cardiology | Admitting: Interventional Cardiology

## 2014-12-12 ENCOUNTER — Encounter (HOSPITAL_COMMUNITY)
Admission: RE | Admit: 2014-12-12 | Discharge: 2014-12-12 | Disposition: A | Discharge status: Home / Self Care | Payer: Self-pay | Source: Ambulatory Visit | Attending: Interventional Cardiology | Admitting: Interventional Cardiology

## 2014-12-14 ENCOUNTER — Encounter (HOSPITAL_COMMUNITY)
Admission: RE | Admit: 2014-12-14 | Discharge: 2014-12-14 | Disposition: A | Discharge status: Home / Self Care | Payer: Self-pay | Source: Ambulatory Visit | Attending: Interventional Cardiology | Admitting: Interventional Cardiology

## 2014-12-16 ENCOUNTER — Encounter (HOSPITAL_COMMUNITY)
Admission: RE | Admit: 2014-12-16 | Discharge: 2014-12-16 | Disposition: A | Discharge status: Home / Self Care | Payer: Self-pay | Source: Ambulatory Visit | Attending: Interventional Cardiology | Admitting: Interventional Cardiology

## 2014-12-19 ENCOUNTER — Encounter (HOSPITAL_COMMUNITY)
Admission: RE | Admit: 2014-12-19 | Discharge: 2014-12-19 | Disposition: A | Discharge status: Home / Self Care | Payer: Self-pay | Source: Ambulatory Visit | Attending: Interventional Cardiology | Admitting: Interventional Cardiology

## 2014-12-19 DIAGNOSIS — I252 Old myocardial infarction: Secondary | ICD-10-CM | POA: Insufficient documentation

## 2014-12-19 DIAGNOSIS — Z955 Presence of coronary angioplasty implant and graft: Secondary | ICD-10-CM | POA: Insufficient documentation

## 2014-12-19 DIAGNOSIS — Z48812 Encounter for surgical aftercare following surgery on the circulatory system: Secondary | ICD-10-CM | POA: Insufficient documentation

## 2014-12-21 ENCOUNTER — Encounter (HOSPITAL_COMMUNITY)
Admission: RE | Admit: 2014-12-21 | Discharge: 2014-12-21 | Disposition: A | Discharge status: Home / Self Care | Payer: Self-pay | Source: Ambulatory Visit | Attending: Interventional Cardiology | Admitting: Interventional Cardiology

## 2014-12-23 ENCOUNTER — Encounter (HOSPITAL_COMMUNITY): Payer: Self-pay

## 2014-12-26 ENCOUNTER — Encounter (HOSPITAL_COMMUNITY)
Admission: RE | Admit: 2014-12-26 | Discharge: 2014-12-26 | Disposition: A | Discharge status: Home / Self Care | Payer: Self-pay | Source: Ambulatory Visit | Attending: Interventional Cardiology | Admitting: Interventional Cardiology

## 2014-12-28 ENCOUNTER — Encounter (HOSPITAL_COMMUNITY)
Admission: RE | Admit: 2014-12-28 | Discharge: 2014-12-28 | Disposition: A | Discharge status: Home / Self Care | Payer: Self-pay | Source: Ambulatory Visit | Attending: Interventional Cardiology | Admitting: Interventional Cardiology

## 2014-12-30 ENCOUNTER — Encounter (HOSPITAL_COMMUNITY)
Admission: RE | Admit: 2014-12-30 | Discharge: 2014-12-30 | Disposition: A | Discharge status: Home / Self Care | Payer: Self-pay | Source: Ambulatory Visit | Attending: Interventional Cardiology | Admitting: Interventional Cardiology

## 2015-01-02 ENCOUNTER — Encounter (HOSPITAL_COMMUNITY)
Admission: RE | Admit: 2015-01-02 | Discharge: 2015-01-02 | Disposition: A | Discharge status: Home / Self Care | Payer: Self-pay | Source: Ambulatory Visit | Attending: Interventional Cardiology | Admitting: Interventional Cardiology

## 2015-01-04 ENCOUNTER — Encounter (HOSPITAL_COMMUNITY)
Admission: RE | Admit: 2015-01-04 | Discharge: 2015-01-04 | Disposition: A | Discharge status: Home / Self Care | Payer: Self-pay | Source: Ambulatory Visit | Attending: Interventional Cardiology | Admitting: Interventional Cardiology

## 2015-01-06 ENCOUNTER — Encounter (HOSPITAL_COMMUNITY)
Admission: RE | Admit: 2015-01-06 | Discharge: 2015-01-06 | Disposition: A | Discharge status: Home / Self Care | Payer: Self-pay | Source: Ambulatory Visit | Attending: Interventional Cardiology | Admitting: Interventional Cardiology

## 2015-01-09 ENCOUNTER — Encounter (HOSPITAL_COMMUNITY)
Admission: RE | Admit: 2015-01-09 | Discharge: 2015-01-09 | Disposition: A | Discharge status: Home / Self Care | Payer: Self-pay | Source: Ambulatory Visit | Attending: Interventional Cardiology | Admitting: Interventional Cardiology

## 2015-01-09 DIAGNOSIS — R8290 Unspecified abnormal findings in urine: Secondary | ICD-10-CM | POA: Diagnosis not present

## 2015-01-09 DIAGNOSIS — N390 Urinary tract infection, site not specified: Secondary | ICD-10-CM | POA: Diagnosis not present

## 2015-01-09 DIAGNOSIS — E039 Hypothyroidism, unspecified: Secondary | ICD-10-CM | POA: Diagnosis not present

## 2015-01-09 DIAGNOSIS — I10 Essential (primary) hypertension: Secondary | ICD-10-CM | POA: Diagnosis not present

## 2015-01-11 ENCOUNTER — Encounter (HOSPITAL_COMMUNITY)
Admission: RE | Admit: 2015-01-11 | Discharge: 2015-01-11 | Disposition: A | Discharge status: Home / Self Care | Payer: Self-pay | Source: Ambulatory Visit | Attending: Interventional Cardiology | Admitting: Interventional Cardiology

## 2015-01-13 ENCOUNTER — Encounter (HOSPITAL_COMMUNITY)
Admission: RE | Admit: 2015-01-13 | Discharge: 2015-01-13 | Disposition: A | Discharge status: Home / Self Care | Payer: Self-pay | Source: Ambulatory Visit | Attending: Interventional Cardiology | Admitting: Interventional Cardiology

## 2015-01-16 ENCOUNTER — Encounter (HOSPITAL_COMMUNITY): Payer: Self-pay

## 2015-01-16 DIAGNOSIS — D649 Anemia, unspecified: Secondary | ICD-10-CM | POA: Diagnosis not present

## 2015-01-16 DIAGNOSIS — Z23 Encounter for immunization: Secondary | ICD-10-CM | POA: Diagnosis not present

## 2015-01-16 DIAGNOSIS — E039 Hypothyroidism, unspecified: Secondary | ICD-10-CM | POA: Diagnosis not present

## 2015-01-16 DIAGNOSIS — I10 Essential (primary) hypertension: Secondary | ICD-10-CM | POA: Diagnosis not present

## 2015-01-16 DIAGNOSIS — Z6831 Body mass index (BMI) 31.0-31.9, adult: Secondary | ICD-10-CM | POA: Diagnosis not present

## 2015-01-16 DIAGNOSIS — I2510 Atherosclerotic heart disease of native coronary artery without angina pectoris: Secondary | ICD-10-CM | POA: Diagnosis not present

## 2015-01-16 DIAGNOSIS — E785 Hyperlipidemia, unspecified: Secondary | ICD-10-CM | POA: Diagnosis not present

## 2015-01-16 DIAGNOSIS — Z0000 Encounter for general adult medical examination without abnormal findings: Secondary | ICD-10-CM | POA: Diagnosis not present

## 2015-01-16 DIAGNOSIS — I213 ST elevation (STEMI) myocardial infarction of unspecified site: Secondary | ICD-10-CM | POA: Diagnosis not present

## 2015-01-17 ENCOUNTER — Encounter: Payer: Self-pay | Admitting: Interventional Cardiology

## 2015-01-17 DIAGNOSIS — Z1212 Encounter for screening for malignant neoplasm of rectum: Secondary | ICD-10-CM | POA: Diagnosis not present

## 2015-01-18 ENCOUNTER — Encounter (HOSPITAL_COMMUNITY): Payer: Medicare Other

## 2015-01-18 DIAGNOSIS — I252 Old myocardial infarction: Secondary | ICD-10-CM | POA: Insufficient documentation

## 2015-01-18 DIAGNOSIS — Z48812 Encounter for surgical aftercare following surgery on the circulatory system: Secondary | ICD-10-CM | POA: Insufficient documentation

## 2015-01-18 DIAGNOSIS — Z955 Presence of coronary angioplasty implant and graft: Secondary | ICD-10-CM | POA: Insufficient documentation

## 2015-01-20 ENCOUNTER — Encounter (HOSPITAL_COMMUNITY)
Admission: RE | Admit: 2015-01-20 | Discharge: 2015-01-20 | Disposition: A | Discharge status: Home / Self Care | Payer: Self-pay | Source: Ambulatory Visit | Attending: Interventional Cardiology | Admitting: Interventional Cardiology

## 2015-01-23 ENCOUNTER — Encounter (HOSPITAL_COMMUNITY)
Admission: RE | Admit: 2015-01-23 | Discharge: 2015-01-23 | Disposition: A | Discharge status: Home / Self Care | Payer: Self-pay | Source: Ambulatory Visit | Attending: Interventional Cardiology | Admitting: Interventional Cardiology

## 2015-01-25 ENCOUNTER — Encounter (HOSPITAL_COMMUNITY)
Admission: RE | Admit: 2015-01-25 | Discharge: 2015-01-25 | Disposition: A | Discharge status: Home / Self Care | Payer: Self-pay | Source: Ambulatory Visit | Attending: Interventional Cardiology | Admitting: Interventional Cardiology

## 2015-01-27 ENCOUNTER — Encounter (HOSPITAL_COMMUNITY)
Admission: RE | Admit: 2015-01-27 | Discharge: 2015-01-27 | Disposition: A | Discharge status: Home / Self Care | Payer: Self-pay | Source: Ambulatory Visit | Attending: Interventional Cardiology | Admitting: Interventional Cardiology

## 2015-01-30 ENCOUNTER — Encounter (HOSPITAL_COMMUNITY)
Admission: RE | Admit: 2015-01-30 | Discharge: 2015-01-30 | Disposition: A | Discharge status: Home / Self Care | Payer: Self-pay | Source: Ambulatory Visit | Attending: Interventional Cardiology | Admitting: Interventional Cardiology

## 2015-02-01 ENCOUNTER — Encounter (HOSPITAL_COMMUNITY)
Admission: RE | Admit: 2015-02-01 | Discharge: 2015-02-01 | Disposition: A | Discharge status: Home / Self Care | Payer: Self-pay | Source: Ambulatory Visit | Attending: Interventional Cardiology | Admitting: Interventional Cardiology

## 2015-02-03 ENCOUNTER — Encounter (HOSPITAL_COMMUNITY)
Admission: RE | Admit: 2015-02-03 | Discharge: 2015-02-03 | Disposition: A | Discharge status: Home / Self Care | Payer: Self-pay | Source: Ambulatory Visit | Attending: Interventional Cardiology | Admitting: Interventional Cardiology

## 2015-02-04 DIAGNOSIS — H903 Sensorineural hearing loss, bilateral: Secondary | ICD-10-CM | POA: Diagnosis not present

## 2015-02-06 ENCOUNTER — Encounter (HOSPITAL_COMMUNITY)
Admission: RE | Admit: 2015-02-06 | Discharge: 2015-02-06 | Disposition: A | Discharge status: Home / Self Care | Payer: Self-pay | Source: Ambulatory Visit | Attending: Interventional Cardiology | Admitting: Interventional Cardiology

## 2015-02-08 ENCOUNTER — Encounter (HOSPITAL_COMMUNITY)
Admission: RE | Admit: 2015-02-08 | Discharge: 2015-02-08 | Disposition: A | Discharge status: Home / Self Care | Payer: Self-pay | Source: Ambulatory Visit | Attending: Interventional Cardiology | Admitting: Interventional Cardiology

## 2015-02-13 ENCOUNTER — Encounter (HOSPITAL_COMMUNITY)
Admission: RE | Admit: 2015-02-13 | Discharge: 2015-02-13 | Disposition: A | Discharge status: Home / Self Care | Payer: Self-pay | Source: Ambulatory Visit | Attending: Interventional Cardiology | Admitting: Interventional Cardiology

## 2015-02-15 ENCOUNTER — Encounter (HOSPITAL_COMMUNITY)
Admission: RE | Admit: 2015-02-15 | Discharge: 2015-02-15 | Disposition: A | Discharge status: Home / Self Care | Payer: Self-pay | Source: Ambulatory Visit | Attending: Interventional Cardiology | Admitting: Interventional Cardiology

## 2015-02-17 ENCOUNTER — Encounter (HOSPITAL_COMMUNITY): Payer: Self-pay

## 2015-02-17 DIAGNOSIS — Z48812 Encounter for surgical aftercare following surgery on the circulatory system: Secondary | ICD-10-CM | POA: Insufficient documentation

## 2015-02-17 DIAGNOSIS — Z955 Presence of coronary angioplasty implant and graft: Secondary | ICD-10-CM | POA: Insufficient documentation

## 2015-02-17 DIAGNOSIS — I252 Old myocardial infarction: Secondary | ICD-10-CM | POA: Insufficient documentation

## 2015-02-20 ENCOUNTER — Encounter (HOSPITAL_COMMUNITY)
Admission: RE | Admit: 2015-02-20 | Discharge: 2015-02-20 | Disposition: A | Discharge status: Home / Self Care | Payer: Self-pay | Source: Ambulatory Visit | Attending: Interventional Cardiology | Admitting: Interventional Cardiology

## 2015-02-22 ENCOUNTER — Encounter (HOSPITAL_COMMUNITY)
Admission: RE | Admit: 2015-02-22 | Discharge: 2015-02-22 | Disposition: A | Discharge status: Home / Self Care | Payer: Self-pay | Source: Ambulatory Visit | Attending: Interventional Cardiology | Admitting: Interventional Cardiology

## 2015-02-23 DIAGNOSIS — B078 Other viral warts: Secondary | ICD-10-CM | POA: Diagnosis not present

## 2015-02-23 DIAGNOSIS — Z23 Encounter for immunization: Secondary | ICD-10-CM | POA: Diagnosis not present

## 2015-02-24 ENCOUNTER — Encounter (HOSPITAL_COMMUNITY)
Admission: RE | Admit: 2015-02-24 | Discharge: 2015-02-24 | Disposition: A | Discharge status: Home / Self Care | Payer: Self-pay | Source: Ambulatory Visit | Attending: Interventional Cardiology | Admitting: Interventional Cardiology

## 2015-02-25 ENCOUNTER — Other Ambulatory Visit: Payer: Self-pay | Admitting: Interventional Cardiology

## 2015-02-27 ENCOUNTER — Encounter (HOSPITAL_COMMUNITY): Payer: Self-pay

## 2015-03-01 ENCOUNTER — Encounter (HOSPITAL_COMMUNITY)
Admission: RE | Admit: 2015-03-01 | Discharge: 2015-03-01 | Disposition: A | Discharge status: Home / Self Care | Payer: Self-pay | Source: Ambulatory Visit | Attending: Interventional Cardiology | Admitting: Interventional Cardiology

## 2015-03-03 ENCOUNTER — Encounter (HOSPITAL_COMMUNITY): Payer: Self-pay

## 2015-03-06 ENCOUNTER — Encounter (HOSPITAL_COMMUNITY): Payer: Self-pay

## 2015-03-08 ENCOUNTER — Encounter (HOSPITAL_COMMUNITY)
Admission: RE | Admit: 2015-03-08 | Discharge: 2015-03-08 | Disposition: A | Discharge status: Home / Self Care | Payer: Self-pay | Source: Ambulatory Visit | Attending: Interventional Cardiology | Admitting: Interventional Cardiology

## 2015-03-10 ENCOUNTER — Encounter (HOSPITAL_COMMUNITY)
Admission: RE | Admit: 2015-03-10 | Discharge: 2015-03-10 | Disposition: A | Discharge status: Home / Self Care | Payer: Self-pay | Source: Ambulatory Visit | Attending: Interventional Cardiology | Admitting: Interventional Cardiology

## 2015-03-15 ENCOUNTER — Encounter (HOSPITAL_COMMUNITY)
Admission: RE | Admit: 2015-03-15 | Discharge: 2015-03-15 | Disposition: A | Discharge status: Home / Self Care | Payer: Self-pay | Source: Ambulatory Visit | Attending: Interventional Cardiology | Admitting: Interventional Cardiology

## 2015-03-17 ENCOUNTER — Encounter (HOSPITAL_COMMUNITY)
Admission: RE | Admit: 2015-03-17 | Discharge: 2015-03-17 | Disposition: A | Discharge status: Home / Self Care | Payer: Self-pay | Source: Ambulatory Visit | Attending: Interventional Cardiology | Admitting: Interventional Cardiology

## 2015-03-22 ENCOUNTER — Encounter (HOSPITAL_COMMUNITY)
Admission: RE | Admit: 2015-03-22 | Discharge: 2015-03-22 | Disposition: A | Discharge status: Home / Self Care | Payer: Self-pay | Source: Ambulatory Visit | Attending: Interventional Cardiology | Admitting: Interventional Cardiology

## 2015-03-22 DIAGNOSIS — Z48812 Encounter for surgical aftercare following surgery on the circulatory system: Secondary | ICD-10-CM | POA: Insufficient documentation

## 2015-03-22 DIAGNOSIS — Z955 Presence of coronary angioplasty implant and graft: Secondary | ICD-10-CM | POA: Insufficient documentation

## 2015-03-22 DIAGNOSIS — I252 Old myocardial infarction: Secondary | ICD-10-CM | POA: Insufficient documentation

## 2015-03-29 ENCOUNTER — Encounter (HOSPITAL_COMMUNITY)
Admission: RE | Admit: 2015-03-29 | Discharge: 2015-03-29 | Disposition: A | Discharge status: Home / Self Care | Payer: Self-pay | Source: Ambulatory Visit | Attending: Interventional Cardiology | Admitting: Interventional Cardiology

## 2015-03-31 ENCOUNTER — Encounter (HOSPITAL_COMMUNITY)
Admission: RE | Admit: 2015-03-31 | Discharge: 2015-03-31 | Disposition: A | Discharge status: Home / Self Care | Payer: Self-pay | Source: Ambulatory Visit | Attending: Interventional Cardiology | Admitting: Interventional Cardiology

## 2015-04-03 ENCOUNTER — Encounter: Payer: Self-pay | Admitting: Internal Medicine

## 2015-04-03 ENCOUNTER — Encounter (HOSPITAL_COMMUNITY)
Admission: RE | Admit: 2015-04-03 | Discharge: 2015-04-03 | Disposition: A | Discharge status: Home / Self Care | Payer: Self-pay | Source: Ambulatory Visit | Attending: Interventional Cardiology | Admitting: Interventional Cardiology

## 2015-04-05 ENCOUNTER — Encounter (HOSPITAL_COMMUNITY)
Admission: RE | Admit: 2015-04-05 | Discharge: 2015-04-05 | Disposition: A | Discharge status: Home / Self Care | Payer: Self-pay | Source: Ambulatory Visit | Attending: Interventional Cardiology | Admitting: Interventional Cardiology

## 2015-04-07 ENCOUNTER — Encounter (HOSPITAL_COMMUNITY)
Admission: RE | Admit: 2015-04-07 | Discharge: 2015-04-07 | Disposition: A | Discharge status: Home / Self Care | Payer: Self-pay | Source: Ambulatory Visit | Attending: Interventional Cardiology | Admitting: Interventional Cardiology

## 2015-04-10 ENCOUNTER — Encounter (HOSPITAL_COMMUNITY)
Admission: RE | Admit: 2015-04-10 | Discharge: 2015-04-10 | Disposition: A | Discharge status: Home / Self Care | Payer: Self-pay | Source: Ambulatory Visit | Attending: Interventional Cardiology | Admitting: Interventional Cardiology

## 2015-04-12 ENCOUNTER — Encounter (HOSPITAL_COMMUNITY)
Admission: RE | Admit: 2015-04-12 | Discharge: 2015-04-12 | Disposition: A | Discharge status: Home / Self Care | Payer: Self-pay | Source: Ambulatory Visit | Attending: Interventional Cardiology | Admitting: Interventional Cardiology

## 2015-04-14 ENCOUNTER — Encounter (HOSPITAL_COMMUNITY)
Admission: RE | Admit: 2015-04-14 | Discharge: 2015-04-14 | Disposition: A | Discharge status: Home / Self Care | Payer: Self-pay | Source: Ambulatory Visit | Attending: Interventional Cardiology | Admitting: Interventional Cardiology

## 2015-04-17 ENCOUNTER — Encounter (HOSPITAL_COMMUNITY)
Admission: RE | Admit: 2015-04-17 | Discharge: 2015-04-17 | Disposition: A | Discharge status: Home / Self Care | Payer: Self-pay | Source: Ambulatory Visit | Attending: Interventional Cardiology | Admitting: Interventional Cardiology

## 2015-04-19 ENCOUNTER — Encounter (HOSPITAL_COMMUNITY)
Admission: RE | Admit: 2015-04-19 | Discharge: 2015-04-19 | Disposition: A | Discharge status: Home / Self Care | Payer: Self-pay | Source: Ambulatory Visit | Attending: Interventional Cardiology | Admitting: Interventional Cardiology

## 2015-04-19 DIAGNOSIS — I252 Old myocardial infarction: Secondary | ICD-10-CM | POA: Insufficient documentation

## 2015-04-19 DIAGNOSIS — Z48812 Encounter for surgical aftercare following surgery on the circulatory system: Secondary | ICD-10-CM | POA: Insufficient documentation

## 2015-04-19 DIAGNOSIS — Z955 Presence of coronary angioplasty implant and graft: Secondary | ICD-10-CM | POA: Insufficient documentation

## 2015-04-21 ENCOUNTER — Encounter (HOSPITAL_COMMUNITY)
Admission: RE | Admit: 2015-04-21 | Discharge: 2015-04-21 | Disposition: A | Discharge status: Home / Self Care | Payer: Self-pay | Source: Ambulatory Visit | Attending: Interventional Cardiology | Admitting: Interventional Cardiology

## 2015-04-24 ENCOUNTER — Encounter (HOSPITAL_COMMUNITY)
Admission: RE | Admit: 2015-04-24 | Discharge: 2015-04-24 | Disposition: A | Discharge status: Home / Self Care | Payer: Self-pay | Source: Ambulatory Visit | Attending: Interventional Cardiology | Admitting: Interventional Cardiology

## 2015-04-25 DIAGNOSIS — H5340 Unspecified visual field defects: Secondary | ICD-10-CM | POA: Diagnosis not present

## 2015-04-25 DIAGNOSIS — H35033 Hypertensive retinopathy, bilateral: Secondary | ICD-10-CM | POA: Diagnosis not present

## 2015-04-26 ENCOUNTER — Encounter (HOSPITAL_COMMUNITY)
Admission: RE | Admit: 2015-04-26 | Discharge: 2015-04-26 | Disposition: A | Discharge status: Home / Self Care | Payer: Self-pay | Source: Ambulatory Visit | Attending: Interventional Cardiology | Admitting: Interventional Cardiology

## 2015-04-28 ENCOUNTER — Other Ambulatory Visit: Payer: Self-pay | Admitting: Interventional Cardiology

## 2015-04-28 ENCOUNTER — Encounter (HOSPITAL_COMMUNITY): Payer: Self-pay

## 2015-05-01 ENCOUNTER — Encounter (HOSPITAL_COMMUNITY)
Admission: RE | Admit: 2015-05-01 | Discharge: 2015-05-01 | Disposition: A | Discharge status: Home / Self Care | Payer: Self-pay | Source: Ambulatory Visit | Attending: Interventional Cardiology | Admitting: Interventional Cardiology

## 2015-05-03 ENCOUNTER — Encounter (HOSPITAL_COMMUNITY)
Admission: RE | Admit: 2015-05-03 | Discharge: 2015-05-03 | Disposition: A | Discharge status: Home / Self Care | Payer: Self-pay | Source: Ambulatory Visit | Attending: Interventional Cardiology | Admitting: Interventional Cardiology

## 2015-05-05 ENCOUNTER — Encounter (HOSPITAL_COMMUNITY)
Admission: RE | Admit: 2015-05-05 | Discharge: 2015-05-05 | Disposition: A | Discharge status: Home / Self Care | Payer: Self-pay | Source: Ambulatory Visit | Attending: Interventional Cardiology | Admitting: Interventional Cardiology

## 2015-05-08 ENCOUNTER — Encounter (HOSPITAL_COMMUNITY)
Admission: RE | Admit: 2015-05-08 | Discharge: 2015-05-08 | Disposition: A | Discharge status: Home / Self Care | Payer: Self-pay | Source: Ambulatory Visit | Attending: Interventional Cardiology | Admitting: Interventional Cardiology

## 2015-05-10 ENCOUNTER — Encounter (HOSPITAL_COMMUNITY)
Admission: RE | Admit: 2015-05-10 | Discharge: 2015-05-10 | Disposition: A | Discharge status: Home / Self Care | Payer: Self-pay | Source: Ambulatory Visit | Attending: Interventional Cardiology | Admitting: Interventional Cardiology

## 2015-05-12 ENCOUNTER — Encounter (HOSPITAL_COMMUNITY)
Admission: RE | Admit: 2015-05-12 | Discharge: 2015-05-12 | Disposition: A | Discharge status: Home / Self Care | Payer: Self-pay | Source: Ambulatory Visit | Attending: Interventional Cardiology | Admitting: Interventional Cardiology

## 2015-05-15 ENCOUNTER — Encounter (HOSPITAL_COMMUNITY): Payer: Self-pay

## 2015-05-17 ENCOUNTER — Encounter (HOSPITAL_COMMUNITY): Payer: Self-pay

## 2015-05-17 DIAGNOSIS — Z48812 Encounter for surgical aftercare following surgery on the circulatory system: Secondary | ICD-10-CM | POA: Insufficient documentation

## 2015-05-17 DIAGNOSIS — Z955 Presence of coronary angioplasty implant and graft: Secondary | ICD-10-CM | POA: Insufficient documentation

## 2015-05-17 DIAGNOSIS — I252 Old myocardial infarction: Secondary | ICD-10-CM | POA: Insufficient documentation

## 2015-05-19 ENCOUNTER — Encounter (HOSPITAL_COMMUNITY): Payer: Self-pay

## 2015-05-22 ENCOUNTER — Encounter (HOSPITAL_COMMUNITY)
Admission: RE | Admit: 2015-05-22 | Discharge: 2015-05-22 | Disposition: A | Discharge status: Home / Self Care | Payer: Self-pay | Source: Ambulatory Visit | Attending: Interventional Cardiology | Admitting: Interventional Cardiology

## 2015-05-23 ENCOUNTER — Other Ambulatory Visit: Payer: Self-pay

## 2015-05-23 MED ORDER — LISINOPRIL 2.5 MG PO TABS: 2.5000 mg | ORAL_TABLET | Freq: Every day | ORAL | Status: DC

## 2015-05-24 ENCOUNTER — Encounter (HOSPITAL_COMMUNITY)
Admission: RE | Admit: 2015-05-24 | Discharge: 2015-05-24 | Disposition: A | Discharge status: Home / Self Care | Payer: Self-pay | Source: Ambulatory Visit | Attending: Interventional Cardiology | Admitting: Interventional Cardiology

## 2015-05-26 ENCOUNTER — Other Ambulatory Visit: Payer: Self-pay | Admitting: Cardiovascular Disease

## 2015-05-26 ENCOUNTER — Encounter (HOSPITAL_COMMUNITY): Payer: Self-pay

## 2015-05-26 ENCOUNTER — Other Ambulatory Visit: Payer: Self-pay | Admitting: Interventional Cardiology

## 2015-05-29 ENCOUNTER — Encounter (HOSPITAL_COMMUNITY)
Admission: RE | Admit: 2015-05-29 | Discharge: 2015-05-29 | Disposition: A | Discharge status: Home / Self Care | Payer: Self-pay | Source: Ambulatory Visit | Attending: Interventional Cardiology | Admitting: Interventional Cardiology

## 2015-05-31 ENCOUNTER — Encounter (HOSPITAL_COMMUNITY): Payer: Self-pay

## 2015-06-02 ENCOUNTER — Encounter (HOSPITAL_COMMUNITY)
Admission: RE | Admit: 2015-06-02 | Discharge: 2015-06-02 | Disposition: A | Discharge status: Home / Self Care | Payer: Self-pay | Source: Ambulatory Visit | Attending: Interventional Cardiology | Admitting: Interventional Cardiology

## 2015-06-05 ENCOUNTER — Encounter (HOSPITAL_COMMUNITY)
Admission: RE | Admit: 2015-06-05 | Discharge: 2015-06-05 | Disposition: A | Discharge status: Home / Self Care | Payer: Self-pay | Source: Ambulatory Visit | Attending: Interventional Cardiology | Admitting: Interventional Cardiology

## 2015-06-07 ENCOUNTER — Encounter (HOSPITAL_COMMUNITY)
Admission: RE | Admit: 2015-06-07 | Discharge: 2015-06-07 | Disposition: A | Discharge status: Home / Self Care | Payer: Self-pay | Source: Ambulatory Visit | Attending: Interventional Cardiology | Admitting: Interventional Cardiology

## 2015-06-09 ENCOUNTER — Encounter (HOSPITAL_COMMUNITY)
Admission: RE | Admit: 2015-06-09 | Discharge: 2015-06-09 | Disposition: A | Discharge status: Home / Self Care | Payer: Self-pay | Source: Ambulatory Visit | Attending: Interventional Cardiology | Admitting: Interventional Cardiology

## 2015-06-12 ENCOUNTER — Encounter (HOSPITAL_COMMUNITY)
Admission: RE | Admit: 2015-06-12 | Discharge: 2015-06-12 | Disposition: A | Discharge status: Home / Self Care | Payer: Self-pay | Source: Ambulatory Visit | Attending: Interventional Cardiology | Admitting: Interventional Cardiology

## 2015-06-14 ENCOUNTER — Encounter (HOSPITAL_COMMUNITY)
Admission: RE | Admit: 2015-06-14 | Discharge: 2015-06-14 | Disposition: A | Discharge status: Home / Self Care | Payer: Self-pay | Source: Ambulatory Visit | Attending: Interventional Cardiology | Admitting: Interventional Cardiology

## 2015-06-16 ENCOUNTER — Encounter (HOSPITAL_COMMUNITY)
Admission: RE | Admit: 2015-06-16 | Discharge: 2015-06-16 | Disposition: A | Discharge status: Home / Self Care | Payer: Self-pay | Source: Ambulatory Visit | Attending: Interventional Cardiology | Admitting: Interventional Cardiology

## 2015-06-19 ENCOUNTER — Encounter (HOSPITAL_COMMUNITY): Payer: Self-pay

## 2015-06-19 DIAGNOSIS — Z48812 Encounter for surgical aftercare following surgery on the circulatory system: Secondary | ICD-10-CM | POA: Insufficient documentation

## 2015-06-19 DIAGNOSIS — Z955 Presence of coronary angioplasty implant and graft: Secondary | ICD-10-CM | POA: Insufficient documentation

## 2015-06-19 DIAGNOSIS — I252 Old myocardial infarction: Secondary | ICD-10-CM | POA: Insufficient documentation

## 2015-06-21 ENCOUNTER — Encounter (HOSPITAL_COMMUNITY)
Admission: RE | Admit: 2015-06-21 | Discharge: 2015-06-21 | Disposition: A | Discharge status: Home / Self Care | Payer: Self-pay | Source: Ambulatory Visit | Attending: Interventional Cardiology | Admitting: Interventional Cardiology

## 2015-06-23 ENCOUNTER — Encounter (HOSPITAL_COMMUNITY)
Admission: RE | Admit: 2015-06-23 | Discharge: 2015-06-23 | Disposition: A | Discharge status: Home / Self Care | Payer: Self-pay | Source: Ambulatory Visit | Attending: Interventional Cardiology | Admitting: Interventional Cardiology

## 2015-06-26 ENCOUNTER — Encounter (HOSPITAL_COMMUNITY)
Admission: RE | Admit: 2015-06-26 | Discharge: 2015-06-26 | Disposition: A | Discharge status: Home / Self Care | Payer: Self-pay | Source: Ambulatory Visit | Attending: Interventional Cardiology | Admitting: Interventional Cardiology

## 2015-06-28 ENCOUNTER — Encounter (HOSPITAL_COMMUNITY): Payer: Self-pay

## 2015-06-30 ENCOUNTER — Encounter (HOSPITAL_COMMUNITY)
Admission: RE | Admit: 2015-06-30 | Discharge: 2015-06-30 | Disposition: A | Discharge status: Home / Self Care | Payer: Self-pay | Source: Ambulatory Visit | Attending: Interventional Cardiology | Admitting: Interventional Cardiology

## 2015-07-03 ENCOUNTER — Encounter (HOSPITAL_COMMUNITY)
Admission: RE | Admit: 2015-07-03 | Discharge: 2015-07-03 | Disposition: A | Discharge status: Home / Self Care | Payer: Self-pay | Source: Ambulatory Visit | Attending: Interventional Cardiology | Admitting: Interventional Cardiology

## 2015-07-05 ENCOUNTER — Encounter (HOSPITAL_COMMUNITY)
Admission: RE | Admit: 2015-07-05 | Discharge: 2015-07-05 | Disposition: A | Discharge status: Home / Self Care | Payer: Self-pay | Source: Ambulatory Visit | Attending: Interventional Cardiology | Admitting: Interventional Cardiology

## 2015-07-07 ENCOUNTER — Encounter (HOSPITAL_COMMUNITY)
Admission: RE | Admit: 2015-07-07 | Discharge: 2015-07-07 | Disposition: A | Discharge status: Home / Self Care | Payer: Self-pay | Source: Ambulatory Visit | Attending: Interventional Cardiology | Admitting: Interventional Cardiology

## 2015-07-10 ENCOUNTER — Encounter (HOSPITAL_COMMUNITY)
Admission: RE | Admit: 2015-07-10 | Discharge: 2015-07-10 | Disposition: A | Discharge status: Home / Self Care | Payer: Self-pay | Source: Ambulatory Visit | Attending: Interventional Cardiology | Admitting: Interventional Cardiology

## 2015-07-12 ENCOUNTER — Encounter (HOSPITAL_COMMUNITY)
Admission: RE | Admit: 2015-07-12 | Discharge: 2015-07-12 | Disposition: A | Discharge status: Home / Self Care | Payer: Self-pay | Source: Ambulatory Visit | Attending: Interventional Cardiology | Admitting: Interventional Cardiology

## 2015-07-14 ENCOUNTER — Encounter (HOSPITAL_COMMUNITY)
Admission: RE | Admit: 2015-07-14 | Discharge: 2015-07-14 | Disposition: A | Discharge status: Home / Self Care | Payer: Self-pay | Source: Ambulatory Visit | Attending: Interventional Cardiology | Admitting: Interventional Cardiology

## 2015-07-17 ENCOUNTER — Encounter (HOSPITAL_COMMUNITY): Payer: Self-pay

## 2015-07-17 DIAGNOSIS — Z48812 Encounter for surgical aftercare following surgery on the circulatory system: Secondary | ICD-10-CM | POA: Insufficient documentation

## 2015-07-17 DIAGNOSIS — I252 Old myocardial infarction: Secondary | ICD-10-CM | POA: Insufficient documentation

## 2015-07-17 DIAGNOSIS — Z955 Presence of coronary angioplasty implant and graft: Secondary | ICD-10-CM | POA: Insufficient documentation

## 2015-07-19 ENCOUNTER — Encounter (HOSPITAL_COMMUNITY)
Admission: RE | Admit: 2015-07-19 | Discharge: 2015-07-19 | Disposition: A | Discharge status: Home / Self Care | Payer: Self-pay | Source: Ambulatory Visit | Attending: Interventional Cardiology | Admitting: Interventional Cardiology

## 2015-07-21 ENCOUNTER — Encounter (HOSPITAL_COMMUNITY)
Admission: RE | Admit: 2015-07-21 | Discharge: 2015-07-21 | Disposition: A | Discharge status: Home / Self Care | Payer: Self-pay | Source: Ambulatory Visit | Attending: Interventional Cardiology | Admitting: Interventional Cardiology

## 2015-07-24 ENCOUNTER — Encounter (HOSPITAL_COMMUNITY)
Admission: RE | Admit: 2015-07-24 | Discharge: 2015-07-24 | Disposition: A | Discharge status: Home / Self Care | Payer: Self-pay | Source: Ambulatory Visit | Attending: Interventional Cardiology | Admitting: Interventional Cardiology

## 2015-07-26 ENCOUNTER — Encounter (HOSPITAL_COMMUNITY): Payer: Self-pay

## 2015-07-26 DIAGNOSIS — D6489 Other specified anemias: Secondary | ICD-10-CM | POA: Diagnosis not present

## 2015-07-26 DIAGNOSIS — I213 ST elevation (STEMI) myocardial infarction of unspecified site: Secondary | ICD-10-CM | POA: Diagnosis not present

## 2015-07-26 DIAGNOSIS — I2510 Atherosclerotic heart disease of native coronary artery without angina pectoris: Secondary | ICD-10-CM | POA: Diagnosis not present

## 2015-07-26 DIAGNOSIS — E784 Other hyperlipidemia: Secondary | ICD-10-CM | POA: Diagnosis not present

## 2015-07-26 DIAGNOSIS — E038 Other specified hypothyroidism: Secondary | ICD-10-CM | POA: Diagnosis not present

## 2015-07-26 DIAGNOSIS — E669 Obesity, unspecified: Secondary | ICD-10-CM | POA: Diagnosis not present

## 2015-07-26 DIAGNOSIS — K219 Gastro-esophageal reflux disease without esophagitis: Secondary | ICD-10-CM | POA: Diagnosis not present

## 2015-07-26 DIAGNOSIS — J301 Allergic rhinitis due to pollen: Secondary | ICD-10-CM | POA: Diagnosis not present

## 2015-07-26 DIAGNOSIS — Z1389 Encounter for screening for other disorder: Secondary | ICD-10-CM | POA: Diagnosis not present

## 2015-07-26 DIAGNOSIS — K8020 Calculus of gallbladder without cholecystitis without obstruction: Secondary | ICD-10-CM | POA: Diagnosis not present

## 2015-07-26 DIAGNOSIS — I10 Essential (primary) hypertension: Secondary | ICD-10-CM | POA: Diagnosis not present

## 2015-07-26 DIAGNOSIS — Z6831 Body mass index (BMI) 31.0-31.9, adult: Secondary | ICD-10-CM | POA: Diagnosis not present

## 2015-07-28 ENCOUNTER — Encounter (HOSPITAL_COMMUNITY)
Admission: RE | Admit: 2015-07-28 | Discharge: 2015-07-28 | Disposition: A | Discharge status: Home / Self Care | Payer: Self-pay | Source: Ambulatory Visit | Attending: Interventional Cardiology | Admitting: Interventional Cardiology

## 2015-07-31 ENCOUNTER — Encounter (HOSPITAL_COMMUNITY)
Admission: RE | Admit: 2015-07-31 | Discharge: 2015-07-31 | Disposition: A | Discharge status: Home / Self Care | Payer: Self-pay | Source: Ambulatory Visit | Attending: Interventional Cardiology | Admitting: Interventional Cardiology

## 2015-08-02 ENCOUNTER — Encounter (HOSPITAL_COMMUNITY): Payer: Self-pay

## 2015-08-04 ENCOUNTER — Encounter (HOSPITAL_COMMUNITY)
Admission: RE | Admit: 2015-08-04 | Discharge: 2015-08-04 | Disposition: A | Discharge status: Home / Self Care | Payer: Self-pay | Source: Ambulatory Visit | Attending: Interventional Cardiology | Admitting: Interventional Cardiology

## 2015-08-07 ENCOUNTER — Encounter (HOSPITAL_COMMUNITY)
Admission: RE | Admit: 2015-08-07 | Discharge: 2015-08-07 | Disposition: A | Discharge status: Home / Self Care | Payer: Self-pay | Source: Ambulatory Visit | Attending: Interventional Cardiology | Admitting: Interventional Cardiology

## 2015-08-09 ENCOUNTER — Encounter (HOSPITAL_COMMUNITY): Payer: Self-pay

## 2015-08-11 ENCOUNTER — Encounter (HOSPITAL_COMMUNITY)
Admission: RE | Admit: 2015-08-11 | Discharge: 2015-08-11 | Disposition: A | Discharge status: Home / Self Care | Payer: Self-pay | Source: Ambulatory Visit | Attending: Interventional Cardiology | Admitting: Interventional Cardiology

## 2015-08-16 ENCOUNTER — Encounter (HOSPITAL_COMMUNITY)
Admission: RE | Admit: 2015-08-16 | Discharge: 2015-08-16 | Disposition: A | Discharge status: Home / Self Care | Payer: Self-pay | Source: Ambulatory Visit | Attending: Interventional Cardiology | Admitting: Interventional Cardiology

## 2015-08-18 ENCOUNTER — Encounter (HOSPITAL_COMMUNITY)
Admission: RE | Admit: 2015-08-18 | Discharge: 2015-08-18 | Disposition: A | Discharge status: Home / Self Care | Payer: Self-pay | Source: Ambulatory Visit | Attending: Interventional Cardiology | Admitting: Interventional Cardiology

## 2015-08-18 DIAGNOSIS — I252 Old myocardial infarction: Secondary | ICD-10-CM | POA: Insufficient documentation

## 2015-08-18 DIAGNOSIS — Z48812 Encounter for surgical aftercare following surgery on the circulatory system: Secondary | ICD-10-CM | POA: Insufficient documentation

## 2015-08-18 DIAGNOSIS — Z955 Presence of coronary angioplasty implant and graft: Secondary | ICD-10-CM | POA: Insufficient documentation

## 2015-08-21 ENCOUNTER — Encounter (HOSPITAL_COMMUNITY)
Admission: RE | Admit: 2015-08-21 | Discharge: 2015-08-21 | Disposition: A | Discharge status: Home / Self Care | Payer: Self-pay | Source: Ambulatory Visit | Attending: Interventional Cardiology | Admitting: Interventional Cardiology

## 2015-08-23 ENCOUNTER — Encounter (HOSPITAL_COMMUNITY)
Admission: RE | Admit: 2015-08-23 | Discharge: 2015-08-23 | Disposition: A | Discharge status: Home / Self Care | Payer: Self-pay | Source: Ambulatory Visit | Attending: Interventional Cardiology | Admitting: Interventional Cardiology

## 2015-08-23 ENCOUNTER — Other Ambulatory Visit: Payer: Self-pay | Admitting: Cardiovascular Disease

## 2015-08-25 ENCOUNTER — Encounter (HOSPITAL_COMMUNITY): Payer: Self-pay

## 2015-08-28 ENCOUNTER — Encounter (HOSPITAL_COMMUNITY)
Admission: RE | Admit: 2015-08-28 | Discharge: 2015-08-28 | Disposition: A | Discharge status: Home / Self Care | Payer: Self-pay | Source: Ambulatory Visit | Attending: Interventional Cardiology | Admitting: Interventional Cardiology

## 2015-08-30 ENCOUNTER — Encounter (HOSPITAL_COMMUNITY)
Admission: RE | Admit: 2015-08-30 | Discharge: 2015-08-30 | Disposition: A | Discharge status: Home / Self Care | Payer: Self-pay | Source: Ambulatory Visit | Attending: Interventional Cardiology | Admitting: Interventional Cardiology

## 2015-09-01 ENCOUNTER — Encounter (HOSPITAL_COMMUNITY)
Admission: RE | Admit: 2015-09-01 | Discharge: 2015-09-01 | Disposition: A | Discharge status: Home / Self Care | Payer: Self-pay | Source: Ambulatory Visit | Attending: Interventional Cardiology | Admitting: Interventional Cardiology

## 2015-09-04 ENCOUNTER — Encounter (HOSPITAL_COMMUNITY)
Admission: RE | Admit: 2015-09-04 | Discharge: 2015-09-04 | Disposition: A | Discharge status: Home / Self Care | Payer: Self-pay | Source: Ambulatory Visit | Attending: Interventional Cardiology | Admitting: Interventional Cardiology

## 2015-09-06 ENCOUNTER — Encounter (HOSPITAL_COMMUNITY)
Admission: RE | Admit: 2015-09-06 | Discharge: 2015-09-06 | Disposition: A | Discharge status: Home / Self Care | Payer: Self-pay | Source: Ambulatory Visit | Attending: Interventional Cardiology | Admitting: Interventional Cardiology

## 2015-09-08 ENCOUNTER — Encounter (HOSPITAL_COMMUNITY)
Admission: RE | Admit: 2015-09-08 | Discharge: 2015-09-08 | Disposition: A | Discharge status: Home / Self Care | Payer: Self-pay | Source: Ambulatory Visit | Attending: Interventional Cardiology | Admitting: Interventional Cardiology

## 2015-09-11 ENCOUNTER — Encounter (HOSPITAL_COMMUNITY)
Admission: RE | Admit: 2015-09-11 | Discharge: 2015-09-11 | Disposition: A | Discharge status: Home / Self Care | Payer: Self-pay | Source: Ambulatory Visit | Attending: Interventional Cardiology | Admitting: Interventional Cardiology

## 2015-09-13 ENCOUNTER — Encounter (HOSPITAL_COMMUNITY)
Admission: RE | Admit: 2015-09-13 | Discharge: 2015-09-13 | Disposition: A | Discharge status: Home / Self Care | Payer: Self-pay | Source: Ambulatory Visit | Attending: Interventional Cardiology | Admitting: Interventional Cardiology

## 2015-09-15 ENCOUNTER — Encounter (HOSPITAL_COMMUNITY)
Admission: RE | Admit: 2015-09-15 | Discharge: 2015-09-15 | Disposition: A | Discharge status: Home / Self Care | Payer: Self-pay | Source: Ambulatory Visit | Attending: Interventional Cardiology | Admitting: Interventional Cardiology

## 2015-09-15 DIAGNOSIS — Z1231 Encounter for screening mammogram for malignant neoplasm of breast: Secondary | ICD-10-CM | POA: Diagnosis not present

## 2015-09-18 ENCOUNTER — Encounter (HOSPITAL_COMMUNITY)
Admission: RE | Admit: 2015-09-18 | Discharge: 2015-09-18 | Disposition: A | Discharge status: Home / Self Care | Payer: Self-pay | Source: Ambulatory Visit | Attending: Interventional Cardiology | Admitting: Interventional Cardiology

## 2015-09-18 DIAGNOSIS — Z48812 Encounter for surgical aftercare following surgery on the circulatory system: Secondary | ICD-10-CM | POA: Insufficient documentation

## 2015-09-18 DIAGNOSIS — Z955 Presence of coronary angioplasty implant and graft: Secondary | ICD-10-CM | POA: Insufficient documentation

## 2015-09-18 DIAGNOSIS — I252 Old myocardial infarction: Secondary | ICD-10-CM | POA: Insufficient documentation

## 2015-09-20 ENCOUNTER — Encounter (HOSPITAL_COMMUNITY)
Admission: RE | Admit: 2015-09-20 | Discharge: 2015-09-20 | Disposition: A | Discharge status: Home / Self Care | Payer: Self-pay | Source: Ambulatory Visit | Attending: Interventional Cardiology | Admitting: Interventional Cardiology

## 2015-09-22 ENCOUNTER — Encounter (HOSPITAL_COMMUNITY)
Admission: RE | Admit: 2015-09-22 | Discharge: 2015-09-22 | Disposition: A | Discharge status: Home / Self Care | Payer: Self-pay | Source: Ambulatory Visit | Attending: Interventional Cardiology | Admitting: Interventional Cardiology

## 2015-09-25 ENCOUNTER — Encounter (HOSPITAL_COMMUNITY)
Admission: RE | Admit: 2015-09-25 | Discharge: 2015-09-25 | Disposition: A | Discharge status: Home / Self Care | Payer: Self-pay | Source: Ambulatory Visit | Attending: Interventional Cardiology | Admitting: Interventional Cardiology

## 2015-09-27 ENCOUNTER — Encounter (HOSPITAL_COMMUNITY)
Admission: RE | Admit: 2015-09-27 | Discharge: 2015-09-27 | Disposition: A | Discharge status: Home / Self Care | Payer: Self-pay | Source: Ambulatory Visit | Attending: Interventional Cardiology | Admitting: Interventional Cardiology

## 2015-09-29 ENCOUNTER — Encounter (HOSPITAL_COMMUNITY): Payer: Self-pay

## 2015-10-02 ENCOUNTER — Encounter (HOSPITAL_COMMUNITY): Payer: Self-pay

## 2015-10-04 ENCOUNTER — Encounter (HOSPITAL_COMMUNITY)
Admission: RE | Admit: 2015-10-04 | Discharge: 2015-10-04 | Disposition: A | Discharge status: Home / Self Care | Payer: Self-pay | Source: Ambulatory Visit | Attending: Interventional Cardiology | Admitting: Interventional Cardiology

## 2015-10-04 ENCOUNTER — Other Ambulatory Visit: Payer: Self-pay | Admitting: Interventional Cardiology

## 2015-10-04 MED ORDER — ATORVASTATIN CALCIUM 80 MG PO TABS: ORAL_TABLET | ORAL | Status: DC

## 2015-10-06 ENCOUNTER — Encounter (HOSPITAL_COMMUNITY): Payer: Self-pay

## 2015-10-09 ENCOUNTER — Encounter (HOSPITAL_COMMUNITY)
Admission: RE | Admit: 2015-10-09 | Discharge: 2015-10-09 | Disposition: A | Discharge status: Home / Self Care | Payer: Self-pay | Source: Ambulatory Visit | Attending: Interventional Cardiology | Admitting: Interventional Cardiology

## 2015-10-11 ENCOUNTER — Encounter (HOSPITAL_COMMUNITY)
Admission: RE | Admit: 2015-10-11 | Discharge: 2015-10-11 | Disposition: A | Discharge status: Home / Self Care | Payer: Self-pay | Source: Ambulatory Visit | Attending: Interventional Cardiology | Admitting: Interventional Cardiology

## 2015-10-13 ENCOUNTER — Encounter (HOSPITAL_COMMUNITY)
Admission: RE | Admit: 2015-10-13 | Discharge: 2015-10-13 | Disposition: A | Discharge status: Home / Self Care | Payer: Self-pay | Source: Ambulatory Visit | Attending: Interventional Cardiology | Admitting: Interventional Cardiology

## 2015-10-16 ENCOUNTER — Encounter (HOSPITAL_COMMUNITY)
Admission: RE | Admit: 2015-10-16 | Discharge: 2015-10-16 | Disposition: A | Discharge status: Home / Self Care | Payer: Self-pay | Source: Ambulatory Visit | Attending: Interventional Cardiology | Admitting: Interventional Cardiology

## 2015-10-18 ENCOUNTER — Encounter (HOSPITAL_COMMUNITY)
Admission: RE | Admit: 2015-10-18 | Discharge: 2015-10-18 | Disposition: A | Discharge status: Home / Self Care | Payer: Self-pay | Source: Ambulatory Visit | Attending: Interventional Cardiology | Admitting: Interventional Cardiology

## 2015-10-18 DIAGNOSIS — Z955 Presence of coronary angioplasty implant and graft: Secondary | ICD-10-CM | POA: Insufficient documentation

## 2015-10-18 DIAGNOSIS — Z48812 Encounter for surgical aftercare following surgery on the circulatory system: Secondary | ICD-10-CM | POA: Insufficient documentation

## 2015-10-18 DIAGNOSIS — I252 Old myocardial infarction: Secondary | ICD-10-CM | POA: Insufficient documentation

## 2015-10-23 ENCOUNTER — Encounter (HOSPITAL_COMMUNITY)
Admission: RE | Admit: 2015-10-23 | Discharge: 2015-10-23 | Disposition: A | Discharge status: Home / Self Care | Payer: Self-pay | Source: Ambulatory Visit | Attending: Interventional Cardiology | Admitting: Interventional Cardiology

## 2015-10-25 ENCOUNTER — Encounter (HOSPITAL_COMMUNITY)
Admission: RE | Admit: 2015-10-25 | Discharge: 2015-10-25 | Disposition: A | Discharge status: Home / Self Care | Payer: Self-pay | Source: Ambulatory Visit | Attending: Interventional Cardiology | Admitting: Interventional Cardiology

## 2015-10-27 ENCOUNTER — Encounter (HOSPITAL_COMMUNITY): Payer: Self-pay

## 2015-10-30 ENCOUNTER — Encounter (HOSPITAL_COMMUNITY)
Admission: RE | Admit: 2015-10-30 | Discharge: 2015-10-30 | Disposition: A | Discharge status: Home / Self Care | Payer: Self-pay | Source: Ambulatory Visit | Attending: Interventional Cardiology | Admitting: Interventional Cardiology

## 2015-11-01 ENCOUNTER — Encounter (HOSPITAL_COMMUNITY): Payer: Self-pay

## 2015-11-03 ENCOUNTER — Encounter (HOSPITAL_COMMUNITY): Payer: Self-pay

## 2015-11-06 ENCOUNTER — Encounter (HOSPITAL_COMMUNITY): Payer: Self-pay

## 2015-11-08 ENCOUNTER — Encounter (HOSPITAL_COMMUNITY): Payer: Self-pay

## 2015-11-10 ENCOUNTER — Encounter (HOSPITAL_COMMUNITY)
Admission: RE | Admit: 2015-11-10 | Discharge: 2015-11-10 | Disposition: A | Discharge status: Home / Self Care | Payer: Self-pay | Source: Ambulatory Visit | Attending: Interventional Cardiology | Admitting: Interventional Cardiology

## 2015-11-13 ENCOUNTER — Encounter (HOSPITAL_COMMUNITY)
Admission: RE | Admit: 2015-11-13 | Discharge: 2015-11-13 | Disposition: A | Discharge status: Home / Self Care | Payer: Self-pay | Source: Ambulatory Visit | Attending: Interventional Cardiology | Admitting: Interventional Cardiology

## 2015-11-15 ENCOUNTER — Encounter (HOSPITAL_COMMUNITY)
Admission: RE | Admit: 2015-11-15 | Discharge: 2015-11-15 | Disposition: A | Discharge status: Home / Self Care | Payer: Self-pay | Source: Ambulatory Visit | Attending: Interventional Cardiology | Admitting: Interventional Cardiology

## 2015-11-17 ENCOUNTER — Encounter (HOSPITAL_COMMUNITY)
Admission: RE | Admit: 2015-11-17 | Discharge: 2015-11-17 | Disposition: A | Discharge status: Home / Self Care | Payer: Self-pay | Source: Ambulatory Visit | Attending: Interventional Cardiology | Admitting: Interventional Cardiology

## 2015-11-17 DIAGNOSIS — Z48812 Encounter for surgical aftercare following surgery on the circulatory system: Secondary | ICD-10-CM | POA: Insufficient documentation

## 2015-11-17 DIAGNOSIS — Z955 Presence of coronary angioplasty implant and graft: Secondary | ICD-10-CM | POA: Insufficient documentation

## 2015-11-17 DIAGNOSIS — I252 Old myocardial infarction: Secondary | ICD-10-CM | POA: Insufficient documentation

## 2015-11-22 ENCOUNTER — Encounter (HOSPITAL_COMMUNITY)
Admission: RE | Admit: 2015-11-22 | Discharge: 2015-11-22 | Disposition: A | Discharge status: Home / Self Care | Payer: Self-pay | Source: Ambulatory Visit | Attending: Interventional Cardiology | Admitting: Interventional Cardiology

## 2015-11-24 ENCOUNTER — Encounter (HOSPITAL_COMMUNITY)
Admission: RE | Admit: 2015-11-24 | Discharge: 2015-11-24 | Disposition: A | Discharge status: Home / Self Care | Payer: Self-pay | Source: Ambulatory Visit | Attending: Interventional Cardiology | Admitting: Interventional Cardiology

## 2015-11-26 NOTE — Progress Notes (Signed)
Patient ID: [name], female   DOB: 03/01/1941, 75 y.o.   MRN: [id number]     Cardiology Office Note   Date:  11/27/2015   ID:  [name], DOB Apr 12, 1940, MRN [id number]  PCP:  [name], MD    No chief complaint on file. CAD/inferior MI   Wt Readings from Last 3 Encounters:  11/27/15 204 lb (92.5 kg)  11/18/14 193 lb 9.6 oz (87.8 kg)  10/19/14 194 lb (88 kg)       History of Present Illness: [name] is a 75 y.o. female  with a hx of CAD status post inferoposterior STEMI 03/2013 treated with a BMS to the left circumflex, HL, HTN, IV dye allergy. Post PCI, she had recurrent nonsustained ventricular tachycardia. Relook cardiac catheterization demonstrated patent circumflex stent. Ejection fraction has been well-preserved. She has had persistent DOE since her MI without significant change. Echo in 06/2013 demonstrated normal LVF. PFTs were unremarkable in 2015. She has seen pulmonology as well.   Admitted to the hospital in 06/2014 with acute blood loss anemia from profound vaginal bleeding. Hemoglobin dropped to 7.9. She was transfused with 1 unit of PRBCs. Antiplatelet agents were stopped.  Echo showed mildly decreased left ventricular function. She ultimately underwent a stress test due to decreased ejection fraction. No ischemia was found.  Ultimately, she ahd a hystectomy in 2016.    She has felt well. She denies any chest discomfort.  She has some shortness of breath- worse in humid weather. No bleeding issues now on the aspirin alone.     Past Medical History:  Diagnosis Date  . Arthritis    needs left knee replacement  . CAD (coronary artery disease)    a. 03/2013 Infpost STEMI/PCI: LM nl, LAD min irregs, LCX 100 (3.0x12 Vision BMS), RCA  mild ostial dzs, EF 55%.  . Essential hypertension, benign   . Fibroids    a. uterine - spotting noted since 03/11/2013.  Marland Kitchen GERD (gastroesophageal reflux disease)   . Hearing loss    a. s/p cochlear  implant on right, hearing aid on left.  Marland Kitchen Hernia of anterior abdominal wall   . History of cardiovascular stress test    Lexiscan Myoview 8/16:  EF 58%, inferolateral scar, no ischemia; Low Risk  . Hyperlipidemia   . Hypothyroidism   . Ischemic cardiomyopathy    Echo 7/16: Septal and posterior lateral HK, EF 45-50%, mild LAE  . IV Contrast Allergy   . Obesity   . Rosacea   . Ventricular tachycardia ([place])    a. Immediate post pci/MI - no complications, on BB.    Past Surgical History:  Procedure Laterality Date  . CERVICAL POLYPECTOMY     2010  . CHOLECYSTECTOMY    . COCHLEAR IMPLANT    . LEFT HEART CATHETERIZATION WITH CORONARY ANGIOGRAM N/A 03/25/2013   Procedure: LEFT HEART CATHETERIZATION WITH CORONARY ANGIOGRAM;  Surgeon: Burnell Blanks, MD;  Location: [facility] CATH LAB;  Service: Cardiovascular;  Laterality: N/A;  . ROBOTIC ASSISTED TOTAL HYSTERECTOMY WITH BILATERAL SALPINGO OOPHERECTOMY Bilateral 08/16/2014   Procedure: ROBOTIC ASSISTED TOTAL HYSTERECTOMY WITH BILATERAL SALPINGO OOPHORECTOMY ;  Surgeon: Everitt Amber, MD;  Location: [facility] ORS;  Service: Gynecology;  Laterality: Bilateral;  . UPPER GASTROINTESTINAL ENDOSCOPY    . uterine polyp     had D and C done to remove the polyp     Current Outpatient Prescriptions  Medication Sig Dispense Refill  . aspirin EC 81 MG tablet Take 81 mg  by mouth daily.    Marland Kitchen atorvastatin (LIPITOR) 80 MG tablet TAKE 1 TABLET IN THE EVENING AT 6PM. (Patient taking differently: Take 40 mg by mouth daily. TAKE 1 TABLET IN THE EVENING AT 6PM.) 90 tablet 0  . diclofenac sodium (VOLTAREN) 1 % GEL Apply 2 g topically 2 (two) times daily. Applies to knees    . furosemide (LASIX) 20 MG tablet TAKE 1 TABLET ONCE DAILY. 30 tablet 9  . lansoprazole (PREVACID) 15 MG capsule Take 15 mg by mouth 2 (two) times daily before a meal.    . levothyroxine (SYNTHROID, LEVOTHROID) 100 MCG tablet Take 100 mcg by mouth daily before breakfast.    . lisinopril  (PRINIVIL,ZESTRIL) 2.5 MG tablet Take 1 tablet (2.5 mg total) by mouth daily. 90 tablet 1  . metoprolol succinate (TOPROL-XL) 25 MG 24 hr tablet Take 1 tablet (25 mg total) by mouth daily. 30 tablet 2  . metroNIDAZOLE (METROGEL) 0.75 % gel Apply 1 application topically daily. Patient places on her cheeks and nose, only about once or twice a week depending on the climate.    . nitroGLYCERIN (NITROSTAT) 0.4 MG SL tablet Place 0.4 mg under the tongue every 5 (five) minutes as needed for chest pain.    . potassium chloride SA (K-DUR,KLOR-CON) 20 MEQ tablet TAKE 1 TABLET ONCE DAILY. 30 tablet 6  . Calcium Carb-Cholecalciferol (CALCIUM + D3) 600-200 MG-UNIT TABS Take 1 tablet by mouth every morning.     Marland Kitchen omega-3 acid ethyl esters (LOVAZA) 1 G capsule Take 1 g by mouth every morning.     . pantoprazole (PROTONIX) 40 MG tablet Take 1 tablet (40 mg total) by mouth daily. (Patient not taking: Reported on 11/27/2015) 30 tablet 6   No current facility-administered medications for this visit.     Allergies:   Bee venom; Contrast media [iodinated diagnostic agents]; and Iohexol    Social History:  The patient  reports that she quit smoking about 47 years ago. Her smoking use included Cigarettes. She has a 0.40 pack-year smoking history. She does not have any smokeless tobacco history on file. She reports that she does not drink alcohol or use drugs.   Family History:  The patient's family history includes Cancer in her mother; Colon cancer in her cousin; Diabetes in her mother and sister; Heart attack in her maternal grandmother and mother; Heart disease in her mother; Heart failure in her mother; Hyperlipidemia in her sister; Hypertension in her mother; Other in her father.    ROS:  Please see the history of present illness.   Otherwise, review of systems are positive for bleeding episode as noted above.   All other systems are reviewed and negative.    PHYSICAL EXAM: VS:  BP (!) 140/100 (BP Location:  Left Arm, Patient Position: Sitting, Cuff Size: Normal)   Pulse 70   Ht 5\' 6"  (1.676 m)   Wt 204 lb (92.5 kg)   BMI 32.93 kg/m  , BMI Body mass index is 32.93 kg/m. GEN: Well nourished, well developed, in no acute distress  HEENT: normal  Neck: no JVD, carotid bruits, or masses Cardiac: RRR; no murmurs, rubs, or gallops,no edema  Respiratory:  clear to auscultation bilaterally, normal work of breathing GI: soft, nontender, nondistended, + BS MS: no deformity or atrophy  Skin: warm and dry, no rash, varicose veins bilaterally Neuro:  Strength and sensation are intact, hearing impaired Psych: euthymic mood, full affect    Recent Labs: No results found for requested labs  within last 8760 hours.   Lipid Panel    Component Value Date/Time   CHOL 114 09/01/2013 1009   TRIG 78.0 09/01/2013 1009   HDL 41.80 09/01/2013 1009   CHOLHDL 3 09/01/2013 1009   VLDL 15.6 09/01/2013 1009   LDLCALC 57 09/01/2013 1009     Other studies Reviewed: Additional studies/ records that were reviewed today with results demonstrating: No ischemia by stress test.  Cath results reviewed.   ASSESSMENT AND PLAN:  1.  Prior inferior MI/CAD: Bare-metal stent used due to some uterine bleeding that she had at the time. No Plavix due to bleeding problems. No NTG use.   2. Hyperlipidemia: to be checked with Dr. Reynaldo Minium per her request.  Refill atorvastatin 40 mg daily.  3. HTN: Controlled. COntinue current meds.  BP at home and at rehab is fine.  WOuld not add any other meds. 4. DOE : Only occurs on humid days.  COntinue regular exercise.  Check BMet and BNP.    Current medicines are reviewed at length with the patient today.  The patient concerns regarding her medicines were addressed.  The following changes have been made:  No change  Labs/ tests ordered today include:  No orders of the defined types were placed in this encounter.   Recommend 150 minutes/week of aerobic exercise Low fat, low  carb, high fiber diet recommended  Disposition:   FU in 1 year   Signed, Larae Grooms, MD  11/27/2015 12:16 PM    [facility] [address], [place], [place]  [address] Phone: [phone]; Fax: [phone]

## 2015-11-27 ENCOUNTER — Other Ambulatory Visit: Payer: Self-pay | Admitting: Interventional Cardiology

## 2015-11-27 ENCOUNTER — Ambulatory Visit (INDEPENDENT_AMBULATORY_CARE_PROVIDER_SITE_OTHER): Payer: Medicare Other | Admitting: Interventional Cardiology

## 2015-11-27 ENCOUNTER — Encounter: Payer: Self-pay | Admitting: Interventional Cardiology

## 2015-11-27 ENCOUNTER — Encounter (HOSPITAL_COMMUNITY): Payer: Self-pay

## 2015-11-27 VITALS — BP 140/100 | HR 70 | Ht 66.0 in | Wt 204.0 lb

## 2015-11-27 DIAGNOSIS — I10 Essential (primary) hypertension: Secondary | ICD-10-CM | POA: Diagnosis not present

## 2015-11-27 DIAGNOSIS — E785 Hyperlipidemia, unspecified: Secondary | ICD-10-CM

## 2015-11-27 DIAGNOSIS — R0602 Shortness of breath: Secondary | ICD-10-CM | POA: Diagnosis not present

## 2015-11-27 DIAGNOSIS — I252 Old myocardial infarction: Secondary | ICD-10-CM | POA: Diagnosis not present

## 2015-11-27 LAB — BASIC METABOLIC PANEL
BUN: 16 mg/dL (ref 7–25)
CO2: 28 mmol/L (ref 20–31)
Calcium: 9.8 mg/dL (ref 8.6–10.4)
Chloride: 100 mmol/L (ref 98–110)
Creat: 0.76 mg/dL (ref 0.60–0.93)
Glucose, Bld: 81 mg/dL (ref 65–99)
POTASSIUM: 4.5 mmol/L (ref 3.5–5.3)
Sodium: 137 mmol/L (ref 135–146)

## 2015-11-27 LAB — BRAIN NATRIURETIC PEPTIDE: BRAIN NATRIURETIC PEPTIDE: 138.4 pg/mL — AB (ref <100)

## 2015-11-27 MED ORDER — ATORVASTATIN CALCIUM 40 MG PO TABS: 40.0000 mg | ORAL_TABLET | Freq: Every day | ORAL | 3 refills | Status: DC

## 2015-11-27 NOTE — Patient Instructions (Signed)
Your physician recommends that you continue on your current medications as directed. Please refer to the Current Medication list given to you today. Your physician recommends that you return for lab work in: today (BMET, BNP) Your physician wants you to follow-up in: [place].   You will receive a reminder letter in the mail two months in advance. If you don't receive a letter, please call our office to schedule the follow-up appointment.

## 2015-11-29 ENCOUNTER — Encounter (HOSPITAL_COMMUNITY)
Admission: RE | Admit: 2015-11-29 | Discharge: 2015-11-29 | Disposition: A | Discharge status: Home / Self Care | Payer: Self-pay | Source: Ambulatory Visit | Attending: Interventional Cardiology | Admitting: Interventional Cardiology

## 2015-12-01 ENCOUNTER — Encounter (HOSPITAL_COMMUNITY)
Admission: RE | Admit: 2015-12-01 | Discharge: 2015-12-01 | Disposition: A | Discharge status: Home / Self Care | Payer: Self-pay | Source: Ambulatory Visit | Attending: Interventional Cardiology | Admitting: Interventional Cardiology

## 2015-12-01 NOTE — Progress Notes (Signed)
[name] 75 y.o. female Nutrition Note Spoke with pt. Pt c/o leg cramps since increasing lasix. Per MD note on BNP 11/27/15 Lasix increased for 2 days and then pt to return to usual dose. Pt educated and handout given re: potassium content of foods to help prevent leg cramps while pt takes increased dose of Lasix. Continue client-centered nutrition education by RD as part of interdisciplinary care.  Monitor and evaluate progress toward nutrition goal with team.  Derek Mound, M.Ed, RD, LDN, CDE 12/01/2015 2:55 PM

## 2015-12-04 ENCOUNTER — Encounter (HOSPITAL_COMMUNITY)
Admission: RE | Admit: 2015-12-04 | Discharge: 2015-12-04 | Disposition: A | Discharge status: Home / Self Care | Payer: Self-pay | Source: Ambulatory Visit | Attending: Interventional Cardiology | Admitting: Interventional Cardiology

## 2015-12-06 ENCOUNTER — Encounter (HOSPITAL_COMMUNITY): Payer: Self-pay

## 2015-12-07 DIAGNOSIS — H903 Sensorineural hearing loss, bilateral: Secondary | ICD-10-CM | POA: Diagnosis not present

## 2015-12-08 ENCOUNTER — Encounter (HOSPITAL_COMMUNITY)
Admission: RE | Admit: 2015-12-08 | Discharge: 2015-12-08 | Disposition: A | Discharge status: Home / Self Care | Payer: Self-pay | Source: Ambulatory Visit | Attending: Interventional Cardiology | Admitting: Interventional Cardiology

## 2015-12-11 ENCOUNTER — Encounter (HOSPITAL_COMMUNITY)
Admission: RE | Admit: 2015-12-11 | Discharge: 2015-12-11 | Disposition: A | Discharge status: Home / Self Care | Payer: Self-pay | Source: Ambulatory Visit | Attending: Interventional Cardiology | Admitting: Interventional Cardiology

## 2015-12-13 ENCOUNTER — Encounter (HOSPITAL_COMMUNITY): Payer: Self-pay

## 2015-12-15 ENCOUNTER — Encounter (HOSPITAL_COMMUNITY): Payer: Self-pay

## 2015-12-18 ENCOUNTER — Encounter (HOSPITAL_COMMUNITY)
Admission: RE | Admit: 2015-12-18 | Discharge: 2015-12-18 | Disposition: A | Discharge status: Home / Self Care | Payer: Self-pay | Source: Ambulatory Visit | Attending: Interventional Cardiology | Admitting: Interventional Cardiology

## 2015-12-18 DIAGNOSIS — Z48812 Encounter for surgical aftercare following surgery on the circulatory system: Secondary | ICD-10-CM | POA: Insufficient documentation

## 2015-12-18 DIAGNOSIS — Z955 Presence of coronary angioplasty implant and graft: Secondary | ICD-10-CM | POA: Insufficient documentation

## 2015-12-18 DIAGNOSIS — I252 Old myocardial infarction: Secondary | ICD-10-CM | POA: Insufficient documentation

## 2015-12-20 ENCOUNTER — Encounter (HOSPITAL_COMMUNITY)
Admission: RE | Admit: 2015-12-20 | Discharge: 2015-12-20 | Disposition: A | Discharge status: Home / Self Care | Payer: Self-pay | Source: Ambulatory Visit | Attending: Interventional Cardiology | Admitting: Interventional Cardiology

## 2015-12-22 ENCOUNTER — Encounter (HOSPITAL_COMMUNITY)
Admission: RE | Admit: 2015-12-22 | Discharge: 2015-12-22 | Disposition: A | Discharge status: Home / Self Care | Payer: Self-pay | Source: Ambulatory Visit | Attending: Interventional Cardiology | Admitting: Interventional Cardiology

## 2015-12-24 ENCOUNTER — Other Ambulatory Visit: Payer: Self-pay | Admitting: Interventional Cardiology

## 2015-12-25 ENCOUNTER — Encounter (HOSPITAL_COMMUNITY): Payer: Self-pay

## 2015-12-27 ENCOUNTER — Encounter (HOSPITAL_COMMUNITY): Payer: Self-pay

## 2015-12-29 ENCOUNTER — Encounter (HOSPITAL_COMMUNITY): Payer: Self-pay

## 2016-01-01 ENCOUNTER — Encounter (HOSPITAL_COMMUNITY)
Admission: RE | Admit: 2016-01-01 | Discharge: 2016-01-01 | Disposition: A | Discharge status: Home / Self Care | Payer: Self-pay | Source: Ambulatory Visit | Attending: Interventional Cardiology | Admitting: Interventional Cardiology

## 2016-01-03 ENCOUNTER — Encounter (HOSPITAL_COMMUNITY)
Admission: RE | Admit: 2016-01-03 | Discharge: 2016-01-03 | Disposition: A | Discharge status: Home / Self Care | Payer: Self-pay | Source: Ambulatory Visit | Attending: Interventional Cardiology | Admitting: Interventional Cardiology

## 2016-01-05 ENCOUNTER — Encounter (HOSPITAL_COMMUNITY): Payer: Self-pay

## 2016-01-08 ENCOUNTER — Encounter (HOSPITAL_COMMUNITY)
Admission: RE | Admit: 2016-01-08 | Discharge: 2016-01-08 | Disposition: A | Discharge status: Home / Self Care | Payer: Self-pay | Source: Ambulatory Visit | Attending: Interventional Cardiology | Admitting: Interventional Cardiology

## 2016-01-10 ENCOUNTER — Encounter (HOSPITAL_COMMUNITY)
Admission: RE | Admit: 2016-01-10 | Discharge: 2016-01-10 | Disposition: A | Discharge status: Home / Self Care | Payer: Self-pay | Source: Ambulatory Visit | Attending: Interventional Cardiology | Admitting: Interventional Cardiology

## 2016-01-11 DIAGNOSIS — E784 Other hyperlipidemia: Secondary | ICD-10-CM | POA: Diagnosis not present

## 2016-01-11 DIAGNOSIS — I10 Essential (primary) hypertension: Secondary | ICD-10-CM | POA: Diagnosis not present

## 2016-01-11 DIAGNOSIS — E038 Other specified hypothyroidism: Secondary | ICD-10-CM | POA: Diagnosis not present

## 2016-01-12 ENCOUNTER — Encounter (HOSPITAL_COMMUNITY)
Admission: RE | Admit: 2016-01-12 | Discharge: 2016-01-12 | Disposition: A | Discharge status: Home / Self Care | Payer: Self-pay | Source: Ambulatory Visit | Attending: Interventional Cardiology | Admitting: Interventional Cardiology

## 2016-01-12 DIAGNOSIS — E669 Obesity, unspecified: Secondary | ICD-10-CM | POA: Diagnosis not present

## 2016-01-12 DIAGNOSIS — K219 Gastro-esophageal reflux disease without esophagitis: Secondary | ICD-10-CM | POA: Diagnosis not present

## 2016-01-12 DIAGNOSIS — I10 Essential (primary) hypertension: Secondary | ICD-10-CM | POA: Diagnosis not present

## 2016-01-12 DIAGNOSIS — D6489 Other specified anemias: Secondary | ICD-10-CM | POA: Diagnosis not present

## 2016-01-12 DIAGNOSIS — J309 Allergic rhinitis, unspecified: Secondary | ICD-10-CM | POA: Diagnosis not present

## 2016-01-12 DIAGNOSIS — Z0000 Encounter for general adult medical examination without abnormal findings: Secondary | ICD-10-CM | POA: Diagnosis not present

## 2016-01-12 DIAGNOSIS — E038 Other specified hypothyroidism: Secondary | ICD-10-CM | POA: Diagnosis not present

## 2016-01-12 DIAGNOSIS — Z6833 Body mass index (BMI) 33.0-33.9, adult: Secondary | ICD-10-CM | POA: Diagnosis not present

## 2016-01-12 DIAGNOSIS — E784 Other hyperlipidemia: Secondary | ICD-10-CM | POA: Diagnosis not present

## 2016-01-12 DIAGNOSIS — Z23 Encounter for immunization: Secondary | ICD-10-CM | POA: Diagnosis not present

## 2016-01-12 DIAGNOSIS — I2510 Atherosclerotic heart disease of native coronary artery without angina pectoris: Secondary | ICD-10-CM | POA: Diagnosis not present

## 2016-01-12 DIAGNOSIS — I213 ST elevation (STEMI) myocardial infarction of unspecified site: Secondary | ICD-10-CM | POA: Diagnosis not present

## 2016-01-15 ENCOUNTER — Encounter (HOSPITAL_COMMUNITY)
Admission: RE | Admit: 2016-01-15 | Discharge: 2016-01-15 | Disposition: A | Discharge status: Home / Self Care | Payer: Self-pay | Source: Ambulatory Visit | Attending: Interventional Cardiology | Admitting: Interventional Cardiology

## 2016-01-17 ENCOUNTER — Encounter (HOSPITAL_COMMUNITY)
Admission: RE | Admit: 2016-01-17 | Discharge: 2016-01-17 | Disposition: A | Discharge status: Home / Self Care | Payer: Self-pay | Source: Ambulatory Visit | Attending: Interventional Cardiology | Admitting: Interventional Cardiology

## 2016-01-17 DIAGNOSIS — Z48812 Encounter for surgical aftercare following surgery on the circulatory system: Secondary | ICD-10-CM | POA: Insufficient documentation

## 2016-01-17 DIAGNOSIS — I252 Old myocardial infarction: Secondary | ICD-10-CM | POA: Insufficient documentation

## 2016-01-17 DIAGNOSIS — Z955 Presence of coronary angioplasty implant and graft: Secondary | ICD-10-CM | POA: Insufficient documentation

## 2016-01-19 ENCOUNTER — Encounter (HOSPITAL_COMMUNITY)
Admission: RE | Admit: 2016-01-19 | Discharge: 2016-01-19 | Disposition: A | Discharge status: Home / Self Care | Payer: Self-pay | Source: Ambulatory Visit | Attending: Interventional Cardiology | Admitting: Interventional Cardiology

## 2016-01-19 DIAGNOSIS — H903 Sensorineural hearing loss, bilateral: Secondary | ICD-10-CM | POA: Diagnosis not present

## 2016-01-22 ENCOUNTER — Encounter (HOSPITAL_COMMUNITY)
Admission: RE | Admit: 2016-01-22 | Discharge: 2016-01-22 | Disposition: A | Discharge status: Home / Self Care | Payer: Self-pay | Source: Ambulatory Visit | Attending: Interventional Cardiology | Admitting: Interventional Cardiology

## 2016-01-23 ENCOUNTER — Other Ambulatory Visit: Payer: Self-pay | Admitting: Interventional Cardiology

## 2016-01-24 ENCOUNTER — Encounter (HOSPITAL_COMMUNITY): Payer: Self-pay

## 2016-01-26 ENCOUNTER — Encounter (HOSPITAL_COMMUNITY)
Admission: RE | Admit: 2016-01-26 | Discharge: 2016-01-26 | Disposition: A | Discharge status: Home / Self Care | Payer: Self-pay | Source: Ambulatory Visit | Attending: Interventional Cardiology | Admitting: Interventional Cardiology

## 2016-01-29 ENCOUNTER — Encounter (HOSPITAL_COMMUNITY): Payer: Self-pay

## 2016-01-31 ENCOUNTER — Encounter (HOSPITAL_COMMUNITY)
Admission: RE | Admit: 2016-01-31 | Discharge: 2016-01-31 | Disposition: A | Discharge status: Home / Self Care | Payer: Self-pay | Source: Ambulatory Visit | Attending: Interventional Cardiology | Admitting: Interventional Cardiology

## 2016-02-02 ENCOUNTER — Encounter (HOSPITAL_COMMUNITY)
Admission: RE | Admit: 2016-02-02 | Discharge: 2016-02-02 | Disposition: A | Discharge status: Home / Self Care | Payer: Self-pay | Source: Ambulatory Visit | Attending: Interventional Cardiology | Admitting: Interventional Cardiology

## 2016-02-05 ENCOUNTER — Encounter (HOSPITAL_COMMUNITY)
Admission: RE | Admit: 2016-02-05 | Discharge: 2016-02-05 | Disposition: A | Discharge status: Home / Self Care | Payer: Self-pay | Source: Ambulatory Visit | Attending: Interventional Cardiology | Admitting: Interventional Cardiology

## 2016-02-07 ENCOUNTER — Encounter (HOSPITAL_COMMUNITY)
Admission: RE | Admit: 2016-02-07 | Discharge: 2016-02-07 | Disposition: A | Discharge status: Home / Self Care | Payer: Self-pay | Source: Ambulatory Visit | Attending: Interventional Cardiology | Admitting: Interventional Cardiology

## 2016-02-12 ENCOUNTER — Encounter (HOSPITAL_COMMUNITY): Payer: Self-pay

## 2016-02-14 ENCOUNTER — Encounter (HOSPITAL_COMMUNITY): Payer: Self-pay

## 2016-02-15 DIAGNOSIS — L719 Rosacea, unspecified: Secondary | ICD-10-CM | POA: Diagnosis not present

## 2016-02-15 DIAGNOSIS — L820 Inflamed seborrheic keratosis: Secondary | ICD-10-CM | POA: Diagnosis not present

## 2016-02-15 DIAGNOSIS — Z23 Encounter for immunization: Secondary | ICD-10-CM | POA: Diagnosis not present

## 2016-02-15 DIAGNOSIS — L720 Epidermal cyst: Secondary | ICD-10-CM | POA: Diagnosis not present

## 2016-02-16 ENCOUNTER — Encounter (HOSPITAL_COMMUNITY)
Admission: RE | Admit: 2016-02-16 | Discharge: 2016-02-16 | Disposition: A | Discharge status: Home / Self Care | Payer: Self-pay | Source: Ambulatory Visit | Attending: Interventional Cardiology | Admitting: Interventional Cardiology

## 2016-02-16 DIAGNOSIS — Z955 Presence of coronary angioplasty implant and graft: Secondary | ICD-10-CM | POA: Insufficient documentation

## 2016-02-16 DIAGNOSIS — Z48812 Encounter for surgical aftercare following surgery on the circulatory system: Secondary | ICD-10-CM | POA: Insufficient documentation

## 2016-02-16 DIAGNOSIS — I252 Old myocardial infarction: Secondary | ICD-10-CM | POA: Insufficient documentation

## 2016-02-19 ENCOUNTER — Encounter (HOSPITAL_COMMUNITY): Payer: Self-pay

## 2016-02-19 DIAGNOSIS — H903 Sensorineural hearing loss, bilateral: Secondary | ICD-10-CM | POA: Diagnosis not present

## 2016-02-21 ENCOUNTER — Encounter (HOSPITAL_COMMUNITY): Payer: Self-pay

## 2016-02-23 ENCOUNTER — Encounter (HOSPITAL_COMMUNITY): Payer: Self-pay

## 2016-02-26 ENCOUNTER — Encounter (HOSPITAL_COMMUNITY): Payer: Self-pay

## 2016-02-28 ENCOUNTER — Encounter (HOSPITAL_COMMUNITY): Payer: Self-pay

## 2016-03-01 ENCOUNTER — Encounter (HOSPITAL_COMMUNITY): Payer: Self-pay

## 2016-03-04 ENCOUNTER — Encounter (HOSPITAL_COMMUNITY)
Admission: RE | Admit: 2016-03-04 | Discharge: 2016-03-04 | Disposition: A | Discharge status: Home / Self Care | Payer: Self-pay | Source: Ambulatory Visit | Attending: Interventional Cardiology | Admitting: Interventional Cardiology

## 2016-03-06 ENCOUNTER — Encounter (HOSPITAL_COMMUNITY)
Admission: RE | Admit: 2016-03-06 | Discharge: 2016-03-06 | Disposition: A | Discharge status: Home / Self Care | Payer: Self-pay | Source: Ambulatory Visit | Attending: Interventional Cardiology | Admitting: Interventional Cardiology

## 2016-03-08 ENCOUNTER — Encounter (HOSPITAL_COMMUNITY)
Admission: RE | Admit: 2016-03-08 | Discharge: 2016-03-08 | Disposition: A | Discharge status: Home / Self Care | Payer: Self-pay | Source: Ambulatory Visit | Attending: Interventional Cardiology | Admitting: Interventional Cardiology

## 2016-03-13 ENCOUNTER — Encounter (HOSPITAL_COMMUNITY)
Admission: RE | Admit: 2016-03-13 | Discharge: 2016-03-13 | Disposition: A | Discharge status: Home / Self Care | Payer: Self-pay | Source: Ambulatory Visit | Attending: Interventional Cardiology | Admitting: Interventional Cardiology

## 2016-03-15 ENCOUNTER — Encounter (HOSPITAL_COMMUNITY)
Admission: RE | Admit: 2016-03-15 | Discharge: 2016-03-15 | Disposition: A | Discharge status: Home / Self Care | Payer: Self-pay | Source: Ambulatory Visit | Attending: Interventional Cardiology | Admitting: Interventional Cardiology

## 2016-03-20 ENCOUNTER — Encounter (HOSPITAL_COMMUNITY): Payer: Self-pay

## 2016-03-20 DIAGNOSIS — Z48812 Encounter for surgical aftercare following surgery on the circulatory system: Secondary | ICD-10-CM | POA: Insufficient documentation

## 2016-03-20 DIAGNOSIS — Z955 Presence of coronary angioplasty implant and graft: Secondary | ICD-10-CM | POA: Insufficient documentation

## 2016-03-20 DIAGNOSIS — I252 Old myocardial infarction: Secondary | ICD-10-CM | POA: Insufficient documentation

## 2016-03-22 ENCOUNTER — Encounter (HOSPITAL_COMMUNITY)
Admission: RE | Admit: 2016-03-22 | Discharge: 2016-03-22 | Disposition: A | Discharge status: Home / Self Care | Payer: Self-pay | Source: Ambulatory Visit | Attending: Interventional Cardiology | Admitting: Interventional Cardiology

## 2016-03-24 ENCOUNTER — Other Ambulatory Visit: Payer: Self-pay | Admitting: Interventional Cardiology

## 2016-03-25 ENCOUNTER — Encounter (HOSPITAL_COMMUNITY)
Admission: RE | Admit: 2016-03-25 | Discharge: 2016-03-25 | Disposition: A | Discharge status: Home / Self Care | Payer: Self-pay | Source: Ambulatory Visit | Attending: Interventional Cardiology | Admitting: Interventional Cardiology

## 2016-03-25 DIAGNOSIS — H903 Sensorineural hearing loss, bilateral: Secondary | ICD-10-CM | POA: Diagnosis not present

## 2016-03-27 ENCOUNTER — Encounter (HOSPITAL_COMMUNITY)
Admission: RE | Admit: 2016-03-27 | Discharge: 2016-03-27 | Disposition: A | Discharge status: Home / Self Care | Payer: Self-pay | Source: Ambulatory Visit | Attending: Interventional Cardiology | Admitting: Interventional Cardiology

## 2016-03-29 ENCOUNTER — Encounter (HOSPITAL_COMMUNITY): Payer: Self-pay

## 2016-04-01 ENCOUNTER — Encounter (HOSPITAL_COMMUNITY): Payer: Self-pay

## 2016-04-03 ENCOUNTER — Encounter (HOSPITAL_COMMUNITY): Payer: Self-pay

## 2016-04-05 ENCOUNTER — Encounter (HOSPITAL_COMMUNITY): Payer: Self-pay

## 2016-04-08 ENCOUNTER — Encounter (HOSPITAL_COMMUNITY)
Admission: RE | Admit: 2016-04-08 | Discharge: 2016-04-08 | Disposition: A | Discharge status: Home / Self Care | Payer: Self-pay | Source: Ambulatory Visit | Attending: Interventional Cardiology | Admitting: Interventional Cardiology

## 2016-04-10 ENCOUNTER — Encounter (HOSPITAL_COMMUNITY)
Admission: RE | Admit: 2016-04-10 | Discharge: 2016-04-10 | Disposition: A | Discharge status: Home / Self Care | Payer: Self-pay | Source: Ambulatory Visit | Attending: Interventional Cardiology | Admitting: Interventional Cardiology

## 2016-04-12 ENCOUNTER — Encounter (HOSPITAL_COMMUNITY)
Admission: RE | Admit: 2016-04-12 | Discharge: 2016-04-12 | Disposition: A | Discharge status: Home / Self Care | Payer: Self-pay | Source: Ambulatory Visit | Attending: Interventional Cardiology | Admitting: Interventional Cardiology

## 2016-04-15 ENCOUNTER — Encounter (HOSPITAL_COMMUNITY): Payer: Self-pay

## 2016-04-17 ENCOUNTER — Encounter (HOSPITAL_COMMUNITY)
Admission: RE | Admit: 2016-04-17 | Discharge: 2016-04-17 | Disposition: A | Discharge status: Home / Self Care | Payer: Self-pay | Source: Ambulatory Visit | Attending: Interventional Cardiology | Admitting: Interventional Cardiology

## 2016-04-19 ENCOUNTER — Encounter (HOSPITAL_COMMUNITY)
Admission: RE | Admit: 2016-04-19 | Discharge: 2016-04-19 | Disposition: A | Discharge status: Home / Self Care | Payer: Self-pay | Source: Ambulatory Visit | Attending: Interventional Cardiology | Admitting: Interventional Cardiology

## 2016-04-19 DIAGNOSIS — I252 Old myocardial infarction: Secondary | ICD-10-CM | POA: Insufficient documentation

## 2016-04-19 DIAGNOSIS — Z955 Presence of coronary angioplasty implant and graft: Secondary | ICD-10-CM | POA: Insufficient documentation

## 2016-04-19 DIAGNOSIS — Z48812 Encounter for surgical aftercare following surgery on the circulatory system: Secondary | ICD-10-CM | POA: Insufficient documentation

## 2016-04-22 ENCOUNTER — Encounter (HOSPITAL_COMMUNITY)
Admission: RE | Admit: 2016-04-22 | Discharge: 2016-04-22 | Disposition: A | Discharge status: Home / Self Care | Payer: Self-pay | Source: Ambulatory Visit | Attending: Interventional Cardiology | Admitting: Interventional Cardiology

## 2016-04-24 ENCOUNTER — Encounter (HOSPITAL_COMMUNITY): Payer: Self-pay

## 2016-04-26 ENCOUNTER — Encounter (HOSPITAL_COMMUNITY)
Admission: RE | Admit: 2016-04-26 | Discharge: 2016-04-26 | Disposition: A | Discharge status: Home / Self Care | Payer: Self-pay | Source: Ambulatory Visit | Attending: Interventional Cardiology | Admitting: Interventional Cardiology

## 2016-04-29 ENCOUNTER — Encounter (HOSPITAL_COMMUNITY)
Admission: RE | Admit: 2016-04-29 | Discharge: 2016-04-29 | Disposition: A | Discharge status: Home / Self Care | Payer: Self-pay | Source: Ambulatory Visit | Attending: Interventional Cardiology | Admitting: Interventional Cardiology

## 2016-05-01 ENCOUNTER — Encounter (HOSPITAL_COMMUNITY)
Admission: RE | Admit: 2016-05-01 | Discharge: 2016-05-01 | Disposition: A | Discharge status: Home / Self Care | Payer: Self-pay | Source: Ambulatory Visit | Attending: Interventional Cardiology | Admitting: Interventional Cardiology

## 2016-05-03 ENCOUNTER — Encounter (HOSPITAL_COMMUNITY)
Admission: RE | Admit: 2016-05-03 | Discharge: 2016-05-03 | Disposition: A | Discharge status: Home / Self Care | Payer: Self-pay | Source: Ambulatory Visit | Attending: Interventional Cardiology | Admitting: Interventional Cardiology

## 2016-05-06 ENCOUNTER — Encounter (HOSPITAL_COMMUNITY)
Admission: RE | Admit: 2016-05-06 | Discharge: 2016-05-06 | Disposition: A | Discharge status: Home / Self Care | Payer: Self-pay | Source: Ambulatory Visit | Attending: Interventional Cardiology | Admitting: Interventional Cardiology

## 2016-05-08 ENCOUNTER — Encounter (HOSPITAL_COMMUNITY)
Admission: RE | Admit: 2016-05-08 | Discharge: 2016-05-08 | Disposition: A | Discharge status: Home / Self Care | Payer: Self-pay | Source: Ambulatory Visit | Attending: Interventional Cardiology | Admitting: Interventional Cardiology

## 2016-05-10 ENCOUNTER — Encounter (HOSPITAL_COMMUNITY): Payer: Self-pay

## 2016-05-13 ENCOUNTER — Encounter (HOSPITAL_COMMUNITY)
Admission: RE | Admit: 2016-05-13 | Discharge: 2016-05-13 | Disposition: A | Discharge status: Home / Self Care | Payer: Self-pay | Source: Ambulatory Visit | Attending: Interventional Cardiology | Admitting: Interventional Cardiology

## 2016-05-15 ENCOUNTER — Encounter (HOSPITAL_COMMUNITY)
Admission: RE | Admit: 2016-05-15 | Discharge: 2016-05-15 | Disposition: A | Discharge status: Home / Self Care | Payer: Self-pay | Source: Ambulatory Visit | Attending: Interventional Cardiology | Admitting: Interventional Cardiology

## 2016-05-20 ENCOUNTER — Encounter (HOSPITAL_COMMUNITY)
Admission: RE | Admit: 2016-05-20 | Discharge: 2016-05-20 | Disposition: A | Discharge status: Home / Self Care | Payer: Self-pay | Source: Ambulatory Visit | Attending: Interventional Cardiology | Admitting: Interventional Cardiology

## 2016-05-20 DIAGNOSIS — Z955 Presence of coronary angioplasty implant and graft: Secondary | ICD-10-CM | POA: Insufficient documentation

## 2016-05-20 DIAGNOSIS — Z48812 Encounter for surgical aftercare following surgery on the circulatory system: Secondary | ICD-10-CM | POA: Insufficient documentation

## 2016-05-20 DIAGNOSIS — I252 Old myocardial infarction: Secondary | ICD-10-CM | POA: Insufficient documentation

## 2016-05-22 ENCOUNTER — Encounter (HOSPITAL_COMMUNITY)
Admission: RE | Admit: 2016-05-22 | Discharge: 2016-05-22 | Disposition: A | Discharge status: Home / Self Care | Payer: Self-pay | Source: Ambulatory Visit | Attending: Interventional Cardiology | Admitting: Interventional Cardiology

## 2016-05-24 ENCOUNTER — Encounter (HOSPITAL_COMMUNITY)
Admission: RE | Admit: 2016-05-24 | Discharge: 2016-05-24 | Disposition: A | Discharge status: Home / Self Care | Payer: Self-pay | Source: Ambulatory Visit | Attending: Interventional Cardiology | Admitting: Interventional Cardiology

## 2016-05-29 ENCOUNTER — Encounter (HOSPITAL_COMMUNITY)
Admission: RE | Admit: 2016-05-29 | Discharge: 2016-05-29 | Disposition: A | Discharge status: Home / Self Care | Payer: Self-pay | Source: Ambulatory Visit | Attending: Interventional Cardiology | Admitting: Interventional Cardiology

## 2016-05-31 ENCOUNTER — Encounter (HOSPITAL_COMMUNITY)
Admission: RE | Admit: 2016-05-31 | Discharge: 2016-05-31 | Disposition: A | Discharge status: Home / Self Care | Payer: Self-pay | Source: Ambulatory Visit | Attending: Interventional Cardiology | Admitting: Interventional Cardiology

## 2016-06-03 ENCOUNTER — Encounter (HOSPITAL_COMMUNITY)
Admission: RE | Admit: 2016-06-03 | Discharge: 2016-06-03 | Disposition: A | Discharge status: Home / Self Care | Payer: Self-pay | Source: Ambulatory Visit | Attending: Interventional Cardiology | Admitting: Interventional Cardiology

## 2016-06-05 ENCOUNTER — Encounter (HOSPITAL_COMMUNITY): Payer: Self-pay

## 2016-06-07 ENCOUNTER — Encounter (HOSPITAL_COMMUNITY)
Admission: RE | Admit: 2016-06-07 | Discharge: 2016-06-07 | Disposition: A | Discharge status: Home / Self Care | Payer: Self-pay | Source: Ambulatory Visit | Attending: Interventional Cardiology | Admitting: Interventional Cardiology

## 2016-06-10 ENCOUNTER — Encounter (HOSPITAL_COMMUNITY)
Admission: RE | Admit: 2016-06-10 | Discharge: 2016-06-10 | Disposition: A | Discharge status: Home / Self Care | Payer: Self-pay | Source: Ambulatory Visit | Attending: Interventional Cardiology | Admitting: Interventional Cardiology

## 2016-06-12 ENCOUNTER — Encounter (HOSPITAL_COMMUNITY): Payer: Self-pay

## 2016-06-14 ENCOUNTER — Encounter (HOSPITAL_COMMUNITY)
Admission: RE | Admit: 2016-06-14 | Discharge: 2016-06-14 | Disposition: A | Discharge status: Home / Self Care | Payer: Self-pay | Source: Ambulatory Visit | Attending: Interventional Cardiology | Admitting: Interventional Cardiology

## 2016-06-17 ENCOUNTER — Encounter (HOSPITAL_COMMUNITY)
Admission: RE | Admit: 2016-06-17 | Discharge: 2016-06-17 | Disposition: A | Discharge status: Home / Self Care | Payer: Self-pay | Source: Ambulatory Visit | Attending: Interventional Cardiology | Admitting: Interventional Cardiology

## 2016-06-17 DIAGNOSIS — H00024 Hordeolum internum left upper eyelid: Secondary | ICD-10-CM | POA: Diagnosis not present

## 2016-06-17 DIAGNOSIS — I252 Old myocardial infarction: Secondary | ICD-10-CM | POA: Insufficient documentation

## 2016-06-17 DIAGNOSIS — Z955 Presence of coronary angioplasty implant and graft: Secondary | ICD-10-CM | POA: Insufficient documentation

## 2016-06-17 DIAGNOSIS — Z48812 Encounter for surgical aftercare following surgery on the circulatory system: Secondary | ICD-10-CM | POA: Insufficient documentation

## 2016-06-19 ENCOUNTER — Encounter (HOSPITAL_COMMUNITY)
Admission: RE | Admit: 2016-06-19 | Discharge: 2016-06-19 | Disposition: A | Discharge status: Home / Self Care | Payer: Self-pay | Source: Ambulatory Visit | Attending: Interventional Cardiology | Admitting: Interventional Cardiology

## 2016-06-21 ENCOUNTER — Encounter (HOSPITAL_COMMUNITY): Payer: Medicare Other

## 2016-06-24 ENCOUNTER — Encounter (HOSPITAL_COMMUNITY)
Admission: RE | Admit: 2016-06-24 | Discharge: 2016-06-24 | Disposition: A | Discharge status: Home / Self Care | Payer: Self-pay | Source: Ambulatory Visit | Attending: Interventional Cardiology | Admitting: Interventional Cardiology

## 2016-06-26 ENCOUNTER — Encounter (HOSPITAL_COMMUNITY)
Admission: RE | Admit: 2016-06-26 | Discharge: 2016-06-26 | Disposition: A | Discharge status: Home / Self Care | Payer: Self-pay | Source: Ambulatory Visit | Attending: Interventional Cardiology | Admitting: Interventional Cardiology

## 2016-06-28 ENCOUNTER — Encounter (HOSPITAL_COMMUNITY): Payer: Medicare Other

## 2016-07-01 ENCOUNTER — Encounter (HOSPITAL_COMMUNITY): Payer: Medicare Other

## 2016-07-03 ENCOUNTER — Encounter (HOSPITAL_COMMUNITY)
Admission: RE | Admit: 2016-07-03 | Discharge: 2016-07-03 | Disposition: A | Discharge status: Home / Self Care | Payer: Self-pay | Source: Ambulatory Visit | Attending: Interventional Cardiology | Admitting: Interventional Cardiology

## 2016-07-05 ENCOUNTER — Encounter (HOSPITAL_COMMUNITY)
Admission: RE | Admit: 2016-07-05 | Discharge: 2016-07-05 | Disposition: A | Discharge status: Home / Self Care | Payer: Self-pay | Source: Ambulatory Visit | Attending: Interventional Cardiology | Admitting: Interventional Cardiology

## 2016-07-08 ENCOUNTER — Encounter (HOSPITAL_COMMUNITY)
Admission: RE | Admit: 2016-07-08 | Discharge: 2016-07-08 | Disposition: A | Discharge status: Home / Self Care | Payer: Self-pay | Source: Ambulatory Visit | Attending: Interventional Cardiology | Admitting: Interventional Cardiology

## 2016-07-10 ENCOUNTER — Encounter (HOSPITAL_COMMUNITY): Payer: Medicare Other

## 2016-07-12 ENCOUNTER — Encounter (HOSPITAL_COMMUNITY): Payer: Medicare Other

## 2016-07-15 ENCOUNTER — Encounter (HOSPITAL_COMMUNITY)
Admission: RE | Admit: 2016-07-15 | Discharge: 2016-07-15 | Disposition: A | Discharge status: Home / Self Care | Payer: Self-pay | Source: Ambulatory Visit | Attending: Interventional Cardiology | Admitting: Interventional Cardiology

## 2016-07-17 ENCOUNTER — Encounter (HOSPITAL_COMMUNITY)
Admission: RE | Admit: 2016-07-17 | Discharge: 2016-07-17 | Disposition: A | Discharge status: Home / Self Care | Payer: Self-pay | Source: Ambulatory Visit | Attending: Interventional Cardiology | Admitting: Interventional Cardiology

## 2016-07-17 DIAGNOSIS — I252 Old myocardial infarction: Secondary | ICD-10-CM | POA: Insufficient documentation

## 2016-07-17 DIAGNOSIS — Z955 Presence of coronary angioplasty implant and graft: Secondary | ICD-10-CM | POA: Insufficient documentation

## 2016-07-17 DIAGNOSIS — Z48812 Encounter for surgical aftercare following surgery on the circulatory system: Secondary | ICD-10-CM | POA: Insufficient documentation

## 2016-07-19 ENCOUNTER — Encounter (HOSPITAL_COMMUNITY)
Admission: RE | Admit: 2016-07-19 | Discharge: 2016-07-19 | Disposition: A | Discharge status: Home / Self Care | Payer: Self-pay | Source: Ambulatory Visit | Attending: Interventional Cardiology | Admitting: Interventional Cardiology

## 2016-07-22 ENCOUNTER — Encounter (HOSPITAL_COMMUNITY)
Admission: RE | Admit: 2016-07-22 | Discharge: 2016-07-22 | Disposition: A | Discharge status: Home / Self Care | Payer: Self-pay | Source: Ambulatory Visit | Attending: Interventional Cardiology | Admitting: Interventional Cardiology

## 2016-07-24 ENCOUNTER — Encounter (HOSPITAL_COMMUNITY): Payer: Self-pay

## 2016-07-25 DIAGNOSIS — E669 Obesity, unspecified: Secondary | ICD-10-CM | POA: Diagnosis not present

## 2016-07-25 DIAGNOSIS — I2510 Atherosclerotic heart disease of native coronary artery without angina pectoris: Secondary | ICD-10-CM | POA: Diagnosis not present

## 2016-07-25 DIAGNOSIS — E038 Other specified hypothyroidism: Secondary | ICD-10-CM | POA: Diagnosis not present

## 2016-07-25 DIAGNOSIS — I10 Essential (primary) hypertension: Secondary | ICD-10-CM | POA: Diagnosis not present

## 2016-07-25 DIAGNOSIS — Z6833 Body mass index (BMI) 33.0-33.9, adult: Secondary | ICD-10-CM | POA: Diagnosis not present

## 2016-07-25 DIAGNOSIS — Z1389 Encounter for screening for other disorder: Secondary | ICD-10-CM | POA: Diagnosis not present

## 2016-07-25 DIAGNOSIS — K219 Gastro-esophageal reflux disease without esophagitis: Secondary | ICD-10-CM | POA: Diagnosis not present

## 2016-07-25 DIAGNOSIS — I213 ST elevation (STEMI) myocardial infarction of unspecified site: Secondary | ICD-10-CM | POA: Diagnosis not present

## 2016-07-25 DIAGNOSIS — E784 Other hyperlipidemia: Secondary | ICD-10-CM | POA: Diagnosis not present

## 2016-07-25 DIAGNOSIS — J309 Allergic rhinitis, unspecified: Secondary | ICD-10-CM | POA: Diagnosis not present

## 2016-07-26 ENCOUNTER — Encounter (HOSPITAL_COMMUNITY): Payer: Self-pay

## 2016-07-29 ENCOUNTER — Encounter (HOSPITAL_COMMUNITY)
Admission: RE | Admit: 2016-07-29 | Discharge: 2016-07-29 | Disposition: A | Discharge status: Home / Self Care | Payer: Self-pay | Source: Ambulatory Visit | Attending: Interventional Cardiology | Admitting: Interventional Cardiology

## 2016-07-31 ENCOUNTER — Encounter (HOSPITAL_COMMUNITY): Payer: Self-pay

## 2016-08-02 ENCOUNTER — Encounter (HOSPITAL_COMMUNITY): Payer: Self-pay

## 2016-08-05 ENCOUNTER — Encounter (HOSPITAL_COMMUNITY)
Admission: RE | Admit: 2016-08-05 | Discharge: 2016-08-05 | Disposition: A | Discharge status: Home / Self Care | Payer: Self-pay | Source: Ambulatory Visit | Attending: Interventional Cardiology | Admitting: Interventional Cardiology

## 2016-08-07 ENCOUNTER — Encounter (HOSPITAL_COMMUNITY): Payer: Self-pay

## 2016-08-08 IMAGING — US US PELVIS COMPLETE
1 series · 13 of 25 positions shown · non-contrast
Comparison: None

CLINICAL DATA: Vaginal bleeding



[Series 1: us pelvis complete · 0.21mm/px · 136 acquisitions, 13 frames shown]
[im 1/136]
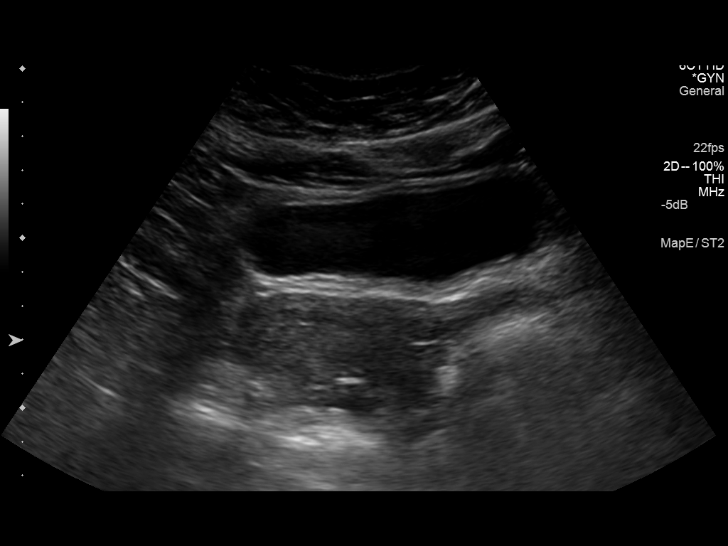
[im 12/136]
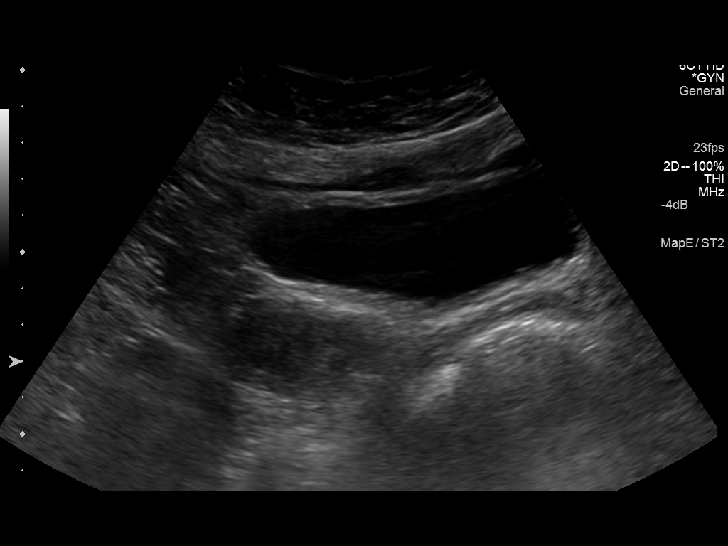
[im 23/136]
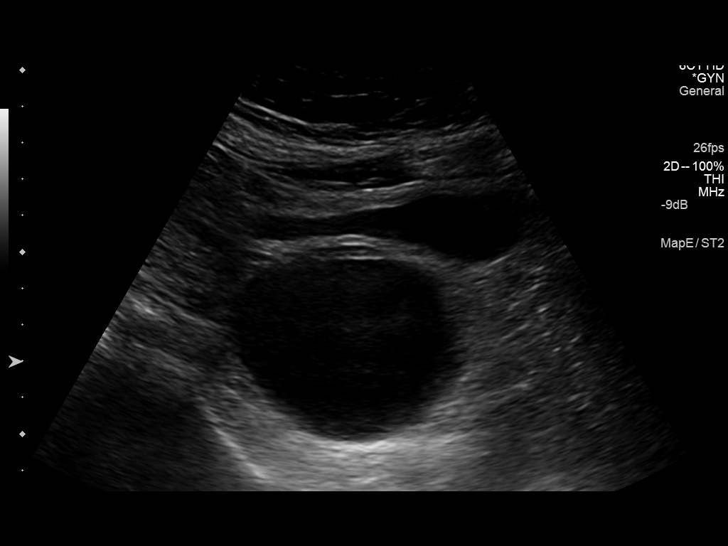
[im 34/136]
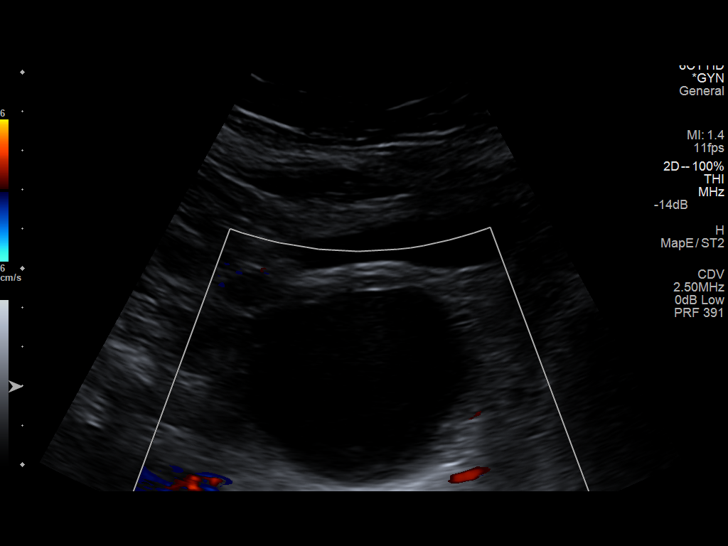
[im 46/136]
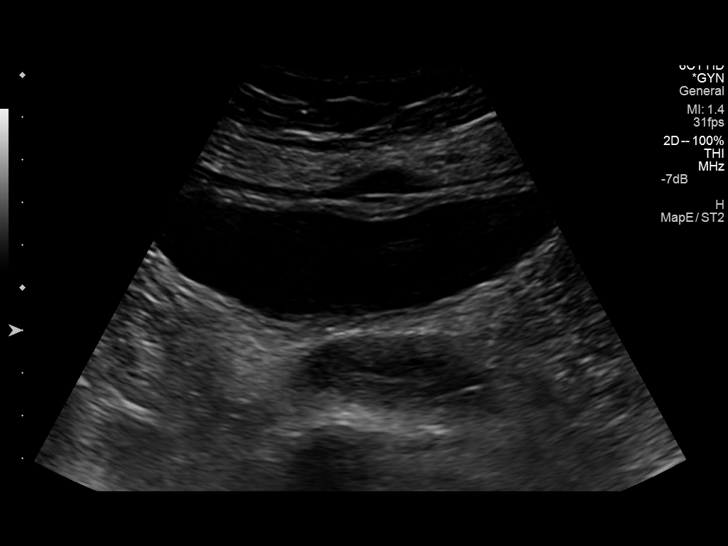
[im 57/136]
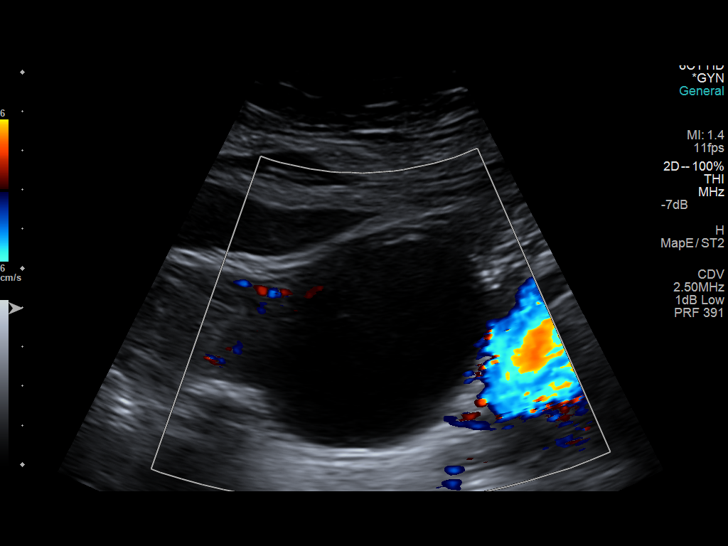
[im 68/136]
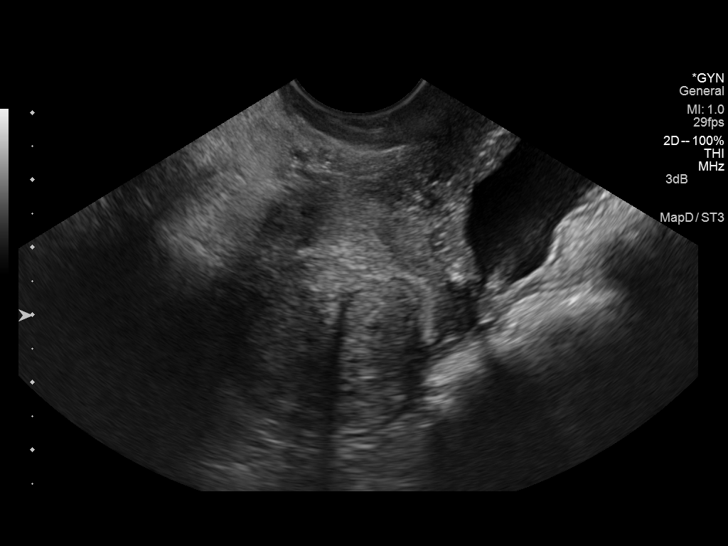
[im 79/136]
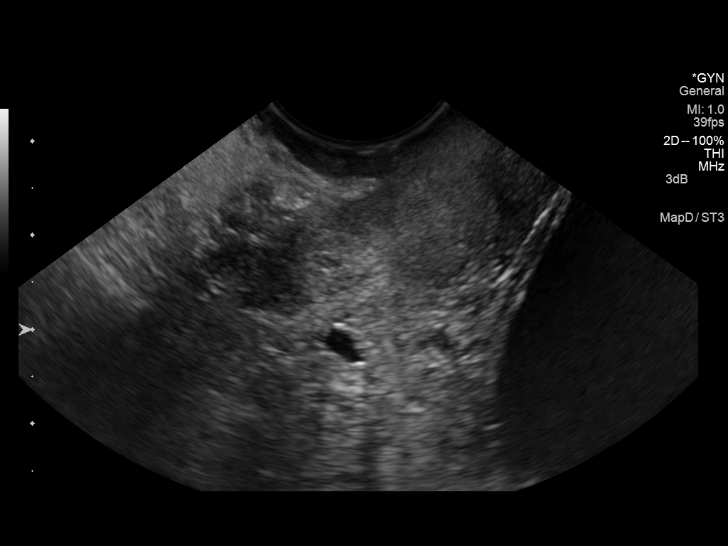
[im 91/136]
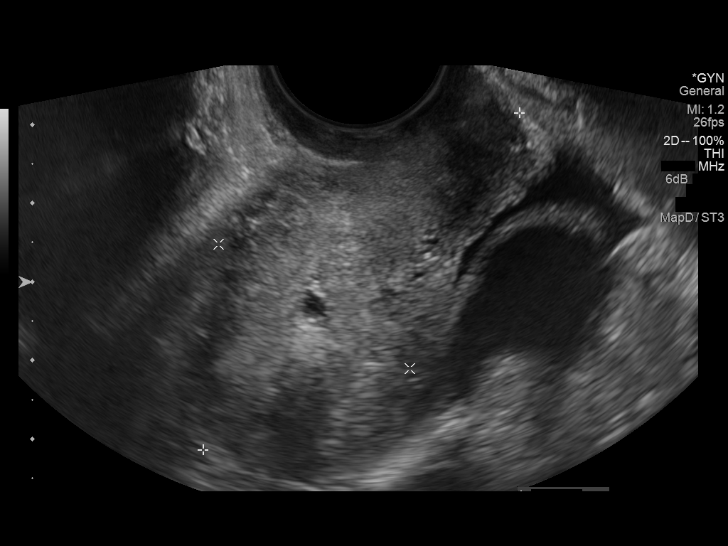
[im 102/136]
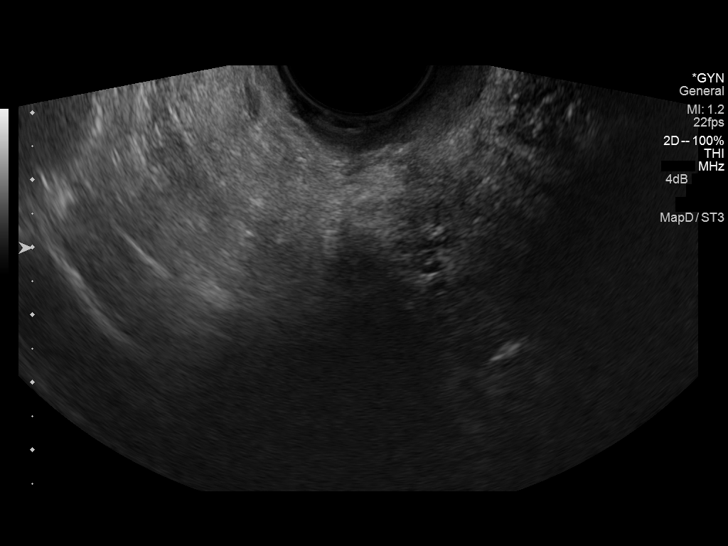
[im 113/136]
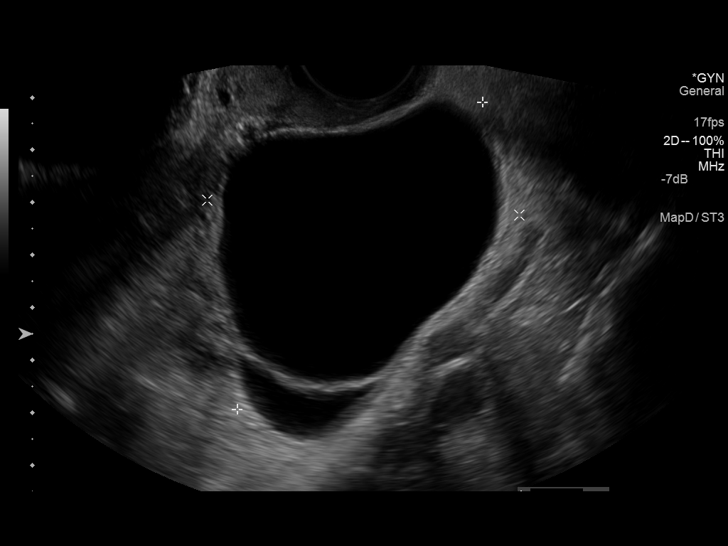
[im 124/136]
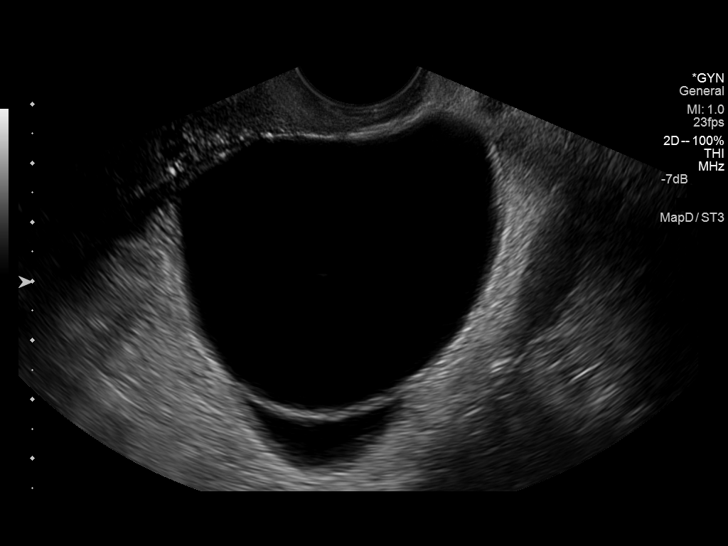
[im 136/136]
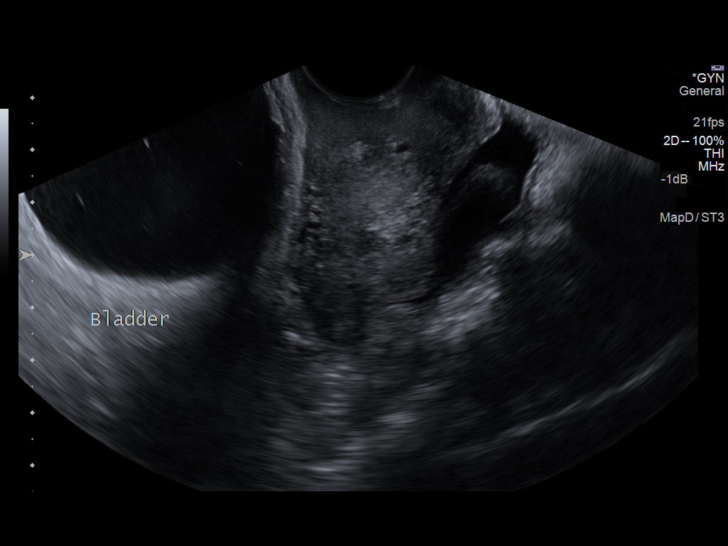

[13 of 25 positions shown; findings below may reference images not displayed]

FINDINGS: Uterus

Measurements: 7.5 x 3.6 x 5.5 cm.. Within the uterine fundus there
is a fibroid measuring 3.0 x 2.9 x 3.0 cm.

Endometrium

Thickness: 2 mm. A small amount of fluid noted within the
endometrial cavity.

Right ovary

Measurements: Not visualize.. No adnexal mass.

Left ovary

Measurements: 7.5 x 6.0 x 6.1 cm. Large anechoic structure within
the left ovary measures 6.1 x 5.0 x 5.3 cm.

Other findings

Trace fluid identified near the left ovary.
IMPRESSION: 1. Large fundal fibroid.
2. Left ovary cyst. This is almost certainly benign, but follow up
ultrasound is recommended in 1 year according to the Society of
Radiologists in VltrasoundH1Y1 Consensus Conference Statement (DAIBOU
Panajoti et al. Management of Asymptomatic Ovarian and Other Adnexal
Cysts Imaged at US: Society of Radiologists in Ultrasound Consensus

## 2016-08-08 IMAGING — CT CT ABD-PELV W/O CM
2 of 4 series · 17 of 46 positions shown, 19 images · non-contrast
Comparison: None.

CLINICAL DATA: Heavy vaginal bleeding.

EXAM:
CT ABDOMEN AND PELVIS WITHOUT CONTRAST
TECHNIQUE: Multidetector CT imaging of the abdomen and pelvis was performed
following the standard protocol without IV contrast.

[Series 2: rtn a/p w/o · axial · non-contrast · 0.79mm/px · z∈[-461,-21]mm · 14 of 96 slices shown, 16 images]
[im 4/96  soft-tissue]
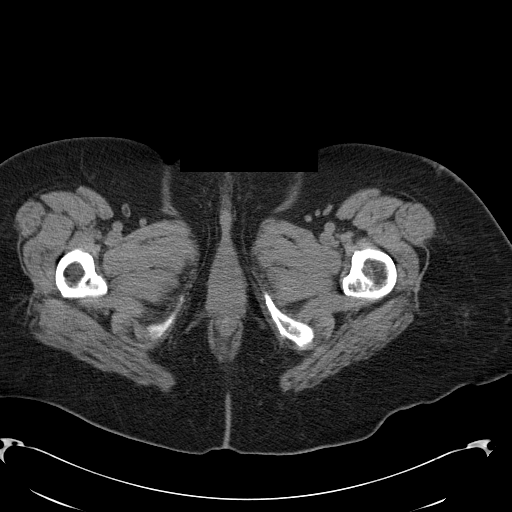
[im 4/96  bone]
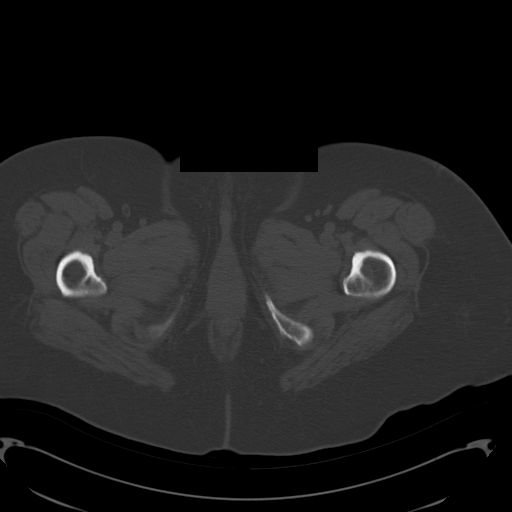
[im 12/96  soft-tissue]
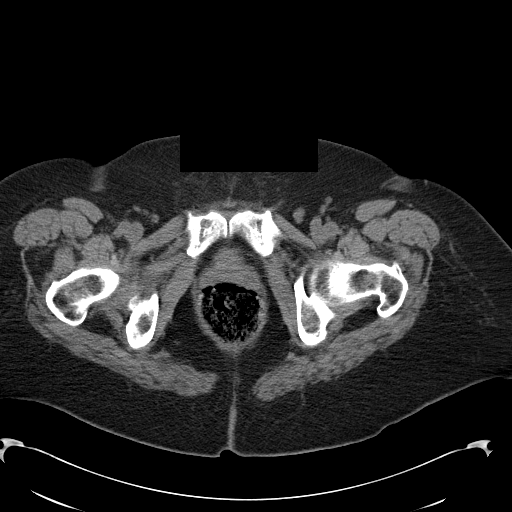
[im 20/96  soft-tissue]
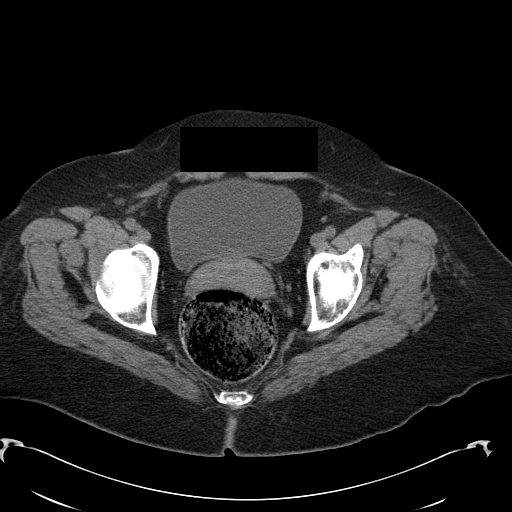
[im 24/96  soft-tissue]
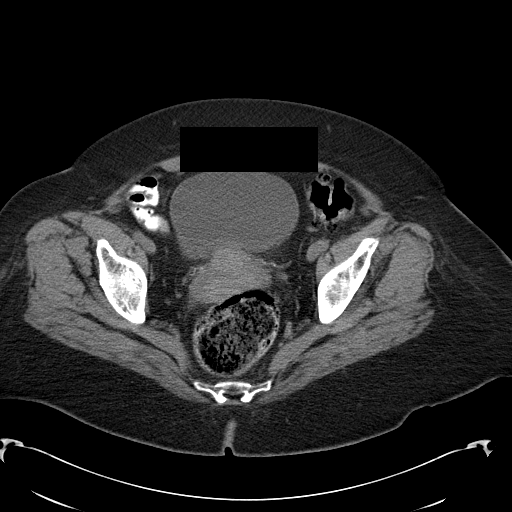
[im 32/96  soft-tissue]
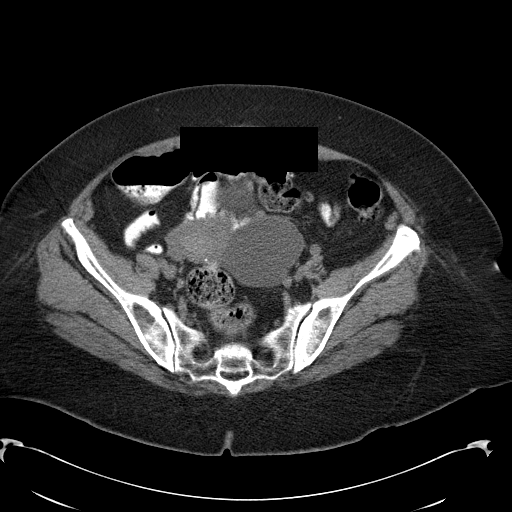
[im 40/96  soft-tissue]
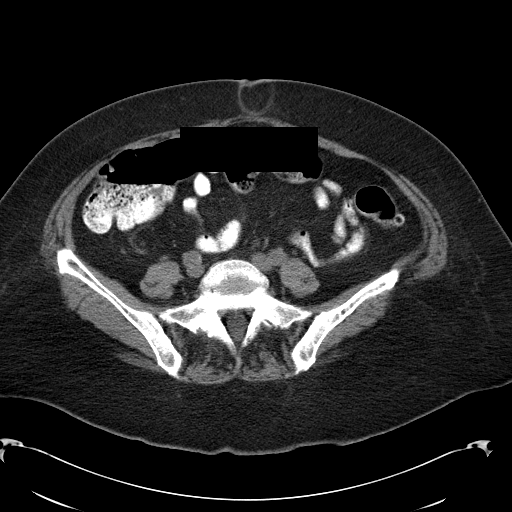
[im 44/96  soft-tissue]
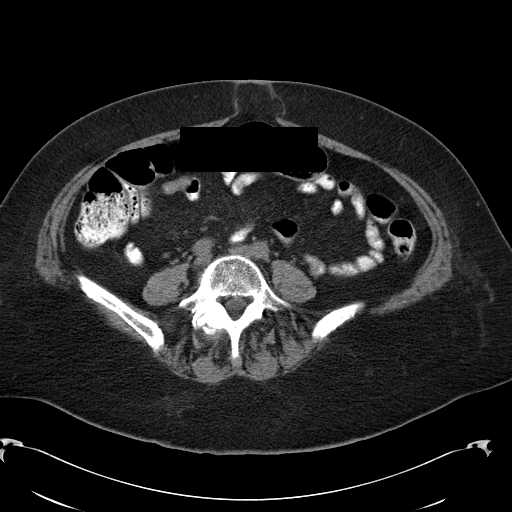
[im 52/96  soft-tissue]
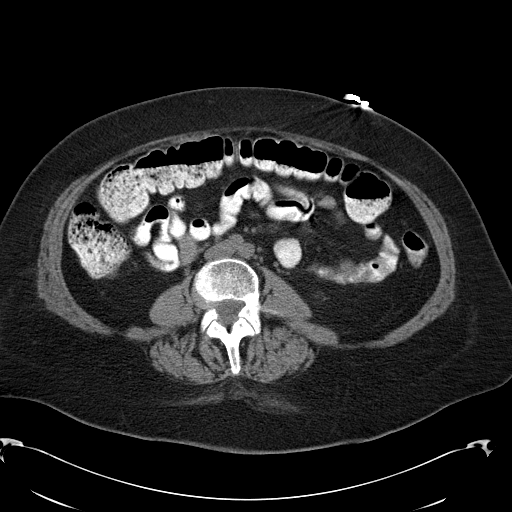
[im 56/96  soft-tissue]
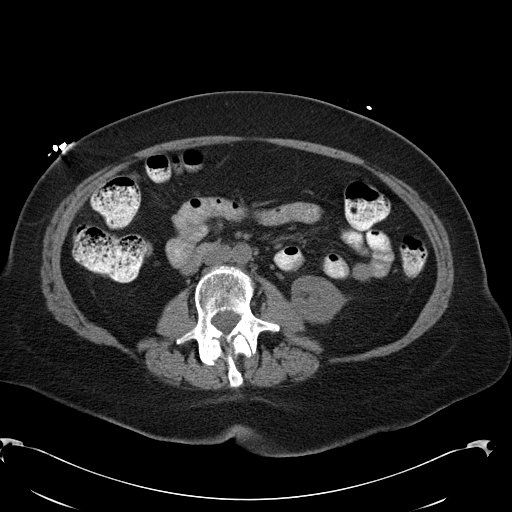
[im 56/96  bone]
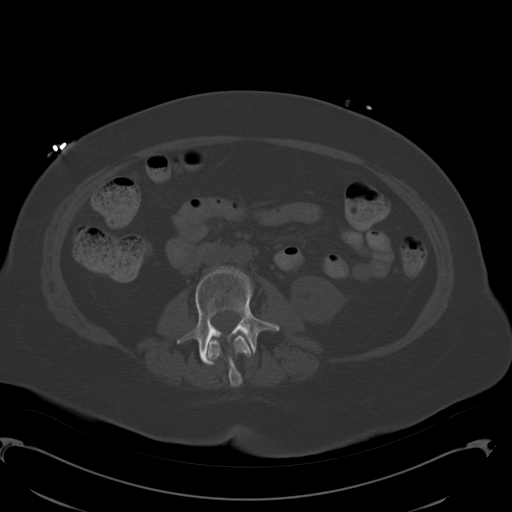
[im 64/96  soft-tissue]
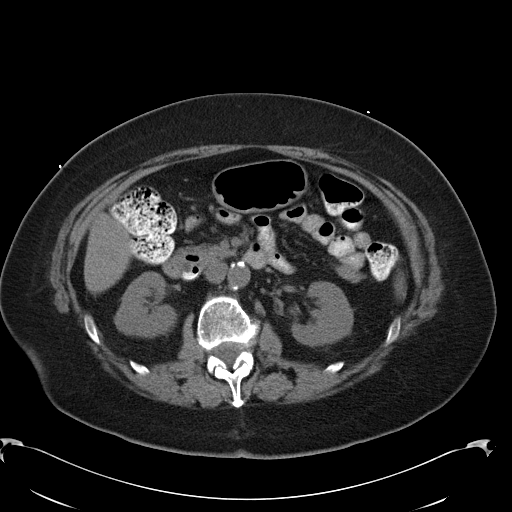
[im 72/96  soft-tissue]
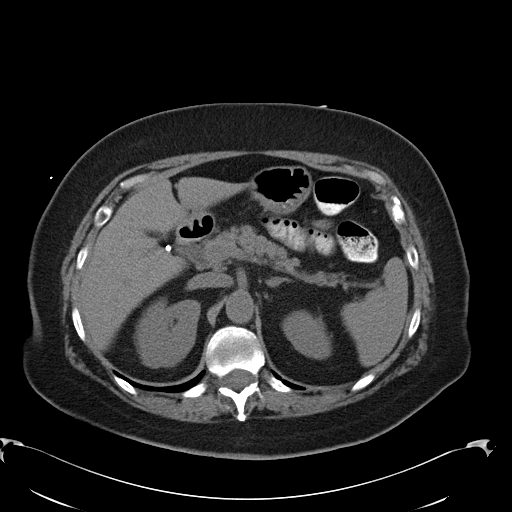
[im 76/96  soft-tissue]
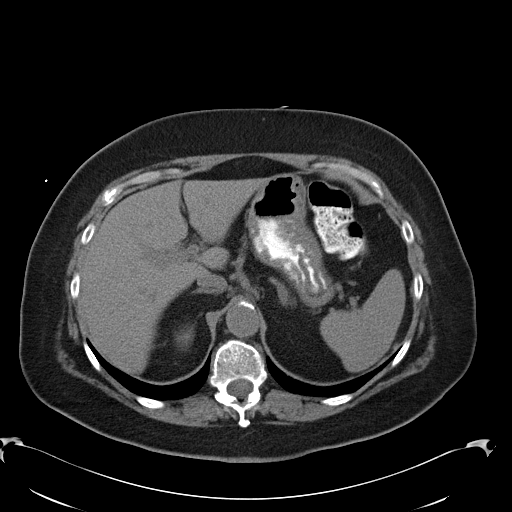
[im 84/96  soft-tissue]
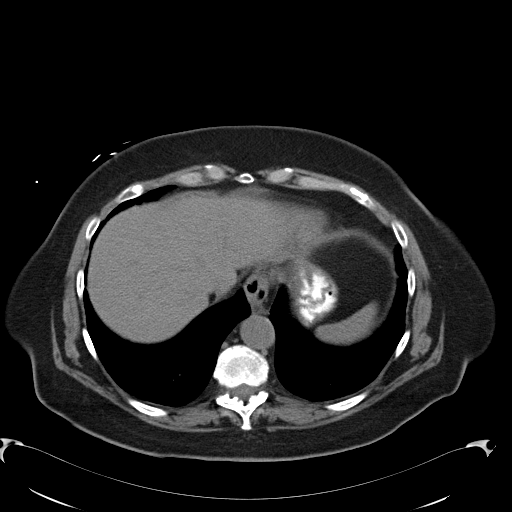
[im 92/96  soft-tissue]
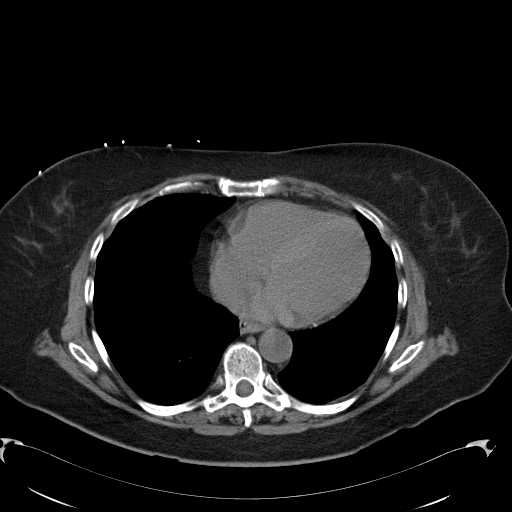

[Series 602: <mpr thick range> · coronal · 0.93mm/px · 3 of 160 slices shown]
[im 54/160  soft-tissue]
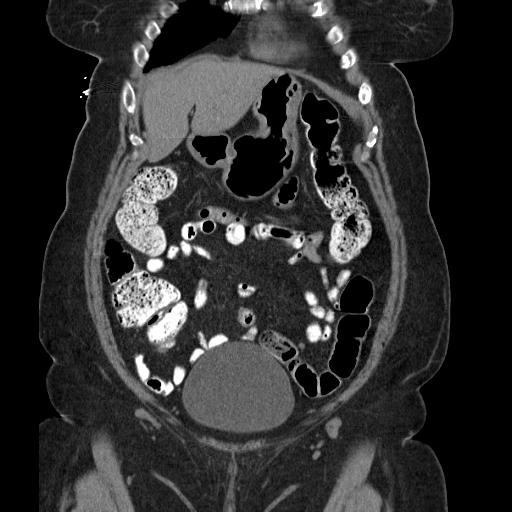
[im 71/160  soft-tissue]
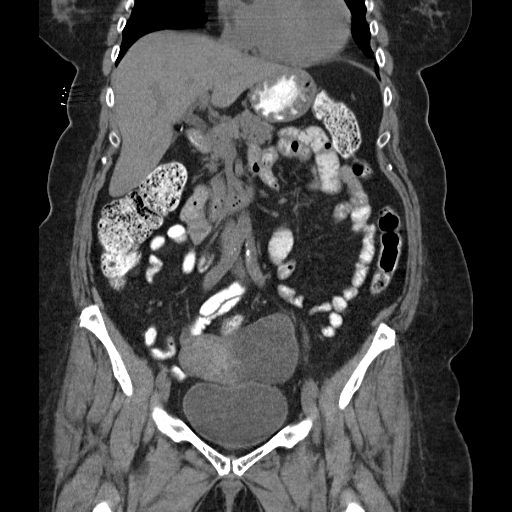
[im 89/160  soft-tissue]
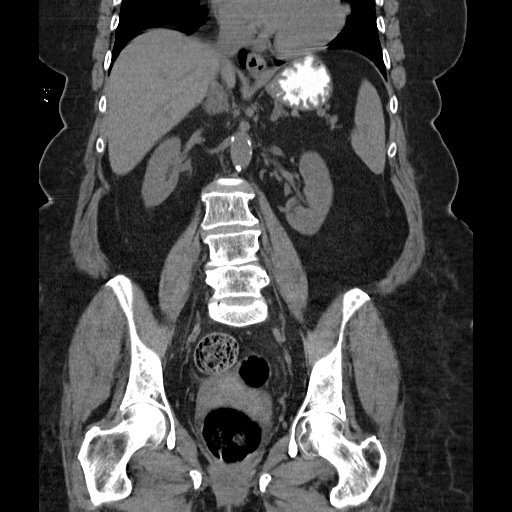

[17 of 46 positions shown; findings below may reference images not displayed]

FINDINGS: Multilevel degenerative disc disease is noted in the lumbar spine.
Visualized lung bases appear normal.

Status post cholecystectomy. No focal abnormality is seen in the
liver, spleen or pancreas on these unenhanced images. Adrenal glands
appear normal. No hydronephrosis or renal obstruction is noted. No
renal or ureteral calculi are noted. Small fat containing
periumbilical hernia is noted. The appendix appears normal. There is
no evidence of bowel obstruction. Urinary bladder appear normal.
Significant endometrial thickening is noted in the uterus which is
abnormal for postmenopausal patient. 6.8 cm cyst is noted in the
left adnexal region. Large amount of stool is noted in the distal
sigmoid colon and rectum concerning for impaction. No significant
adenopathy is noted.
IMPRESSION: 6.8 cm cystic abnormality seen in left adnexal region; also noted is
probable significant endometrial thickening which is abnormal for a
postmenopausal patient. This is concerning for possible endometrial
neoplasm or malignancy, and pelvic ultrasound is recommended for
further evaluation.

Small fat containing periumbilical hernia.

Large amount of stool seen in distal sigmoid colon and rectum
concerning for impaction.

## 2016-08-09 ENCOUNTER — Encounter (HOSPITAL_COMMUNITY)
Admission: RE | Admit: 2016-08-09 | Discharge: 2016-08-09 | Disposition: A | Discharge status: Home / Self Care | Payer: Self-pay | Source: Ambulatory Visit | Attending: Interventional Cardiology | Admitting: Interventional Cardiology

## 2016-08-14 ENCOUNTER — Encounter (HOSPITAL_COMMUNITY): Payer: Self-pay

## 2016-08-16 ENCOUNTER — Encounter (HOSPITAL_COMMUNITY): Payer: Self-pay

## 2016-08-16 DIAGNOSIS — Z955 Presence of coronary angioplasty implant and graft: Secondary | ICD-10-CM | POA: Insufficient documentation

## 2016-08-16 DIAGNOSIS — Z48812 Encounter for surgical aftercare following surgery on the circulatory system: Secondary | ICD-10-CM | POA: Insufficient documentation

## 2016-08-16 DIAGNOSIS — I252 Old myocardial infarction: Secondary | ICD-10-CM | POA: Insufficient documentation

## 2016-08-19 ENCOUNTER — Encounter (HOSPITAL_COMMUNITY)
Admission: RE | Admit: 2016-08-19 | Discharge: 2016-08-19 | Disposition: A | Discharge status: Home / Self Care | Payer: Self-pay | Source: Ambulatory Visit | Attending: Interventional Cardiology | Admitting: Interventional Cardiology

## 2016-08-21 ENCOUNTER — Encounter (HOSPITAL_COMMUNITY): Payer: Self-pay

## 2016-08-23 ENCOUNTER — Encounter (HOSPITAL_COMMUNITY)
Admission: RE | Admit: 2016-08-23 | Discharge: 2016-08-23 | Disposition: A | Discharge status: Home / Self Care | Payer: Self-pay | Source: Ambulatory Visit | Attending: Interventional Cardiology | Admitting: Interventional Cardiology

## 2016-08-26 ENCOUNTER — Encounter (HOSPITAL_COMMUNITY)
Admission: RE | Admit: 2016-08-26 | Discharge: 2016-08-26 | Disposition: A | Discharge status: Home / Self Care | Payer: Self-pay | Source: Ambulatory Visit | Attending: Interventional Cardiology | Admitting: Interventional Cardiology

## 2016-08-28 ENCOUNTER — Encounter (HOSPITAL_COMMUNITY): Payer: Self-pay

## 2016-08-30 ENCOUNTER — Encounter (HOSPITAL_COMMUNITY): Payer: Self-pay

## 2016-09-02 ENCOUNTER — Encounter (HOSPITAL_COMMUNITY): Payer: Self-pay

## 2016-09-04 ENCOUNTER — Encounter (HOSPITAL_COMMUNITY): Payer: Medicare Other

## 2016-09-06 ENCOUNTER — Encounter (HOSPITAL_COMMUNITY): Payer: Medicare Other

## 2016-09-09 ENCOUNTER — Encounter (HOSPITAL_COMMUNITY)
Admission: RE | Admit: 2016-09-09 | Discharge: 2016-09-09 | Disposition: A | Discharge status: Home / Self Care | Payer: Self-pay | Source: Ambulatory Visit | Attending: Interventional Cardiology | Admitting: Interventional Cardiology

## 2016-09-11 ENCOUNTER — Encounter (HOSPITAL_COMMUNITY): Payer: Medicare Other

## 2016-09-13 ENCOUNTER — Encounter (HOSPITAL_COMMUNITY): Payer: Medicare Other

## 2016-09-16 ENCOUNTER — Encounter (HOSPITAL_COMMUNITY): Payer: Medicare Other

## 2016-09-16 DIAGNOSIS — I252 Old myocardial infarction: Secondary | ICD-10-CM | POA: Insufficient documentation

## 2016-09-16 DIAGNOSIS — Z48812 Encounter for surgical aftercare following surgery on the circulatory system: Secondary | ICD-10-CM | POA: Insufficient documentation

## 2016-09-16 DIAGNOSIS — Z955 Presence of coronary angioplasty implant and graft: Secondary | ICD-10-CM | POA: Insufficient documentation

## 2016-09-20 ENCOUNTER — Encounter (HOSPITAL_COMMUNITY): Payer: Medicare Other

## 2016-09-23 ENCOUNTER — Encounter (HOSPITAL_COMMUNITY): Payer: Medicare Other

## 2016-09-25 ENCOUNTER — Encounter (HOSPITAL_COMMUNITY): Payer: Self-pay

## 2016-09-27 ENCOUNTER — Encounter (HOSPITAL_COMMUNITY)
Admission: RE | Admit: 2016-09-27 | Discharge: 2016-09-27 | Disposition: A | Discharge status: Home / Self Care | Payer: Self-pay | Source: Ambulatory Visit | Attending: Interventional Cardiology | Admitting: Interventional Cardiology

## 2016-09-30 ENCOUNTER — Encounter (HOSPITAL_COMMUNITY): Payer: Self-pay

## 2016-10-02 ENCOUNTER — Encounter (HOSPITAL_COMMUNITY): Payer: Self-pay

## 2016-10-04 ENCOUNTER — Encounter (HOSPITAL_COMMUNITY): Payer: Self-pay

## 2016-10-07 ENCOUNTER — Encounter (HOSPITAL_COMMUNITY): Payer: Self-pay

## 2016-10-09 ENCOUNTER — Encounter (HOSPITAL_COMMUNITY): Payer: Self-pay

## 2016-10-11 ENCOUNTER — Encounter (HOSPITAL_COMMUNITY): Payer: Self-pay

## 2016-10-14 ENCOUNTER — Encounter (HOSPITAL_COMMUNITY): Payer: Self-pay

## 2016-10-18 ENCOUNTER — Encounter (HOSPITAL_COMMUNITY): Payer: Self-pay

## 2016-10-18 DIAGNOSIS — I2510 Atherosclerotic heart disease of native coronary artery without angina pectoris: Secondary | ICD-10-CM | POA: Insufficient documentation

## 2016-10-21 ENCOUNTER — Encounter (HOSPITAL_COMMUNITY)
Admission: RE | Admit: 2016-10-21 | Discharge: 2016-10-21 | Disposition: A | Discharge status: Home / Self Care | Payer: Self-pay | Source: Ambulatory Visit | Attending: Interventional Cardiology | Admitting: Interventional Cardiology

## 2016-10-23 ENCOUNTER — Encounter (HOSPITAL_COMMUNITY): Payer: Self-pay

## 2016-10-25 ENCOUNTER — Encounter (HOSPITAL_COMMUNITY): Payer: Self-pay

## 2016-10-28 ENCOUNTER — Encounter (HOSPITAL_COMMUNITY): Payer: Self-pay

## 2016-10-30 ENCOUNTER — Encounter (HOSPITAL_COMMUNITY): Payer: Self-pay

## 2016-11-01 ENCOUNTER — Encounter (HOSPITAL_COMMUNITY): Payer: Self-pay

## 2016-11-04 ENCOUNTER — Encounter (HOSPITAL_COMMUNITY): Payer: Self-pay

## 2016-11-06 ENCOUNTER — Encounter (HOSPITAL_COMMUNITY): Payer: Self-pay

## 2016-11-08 ENCOUNTER — Encounter (HOSPITAL_COMMUNITY): Payer: Self-pay

## 2016-11-11 ENCOUNTER — Encounter (HOSPITAL_COMMUNITY): Payer: Self-pay

## 2016-11-13 ENCOUNTER — Encounter (HOSPITAL_COMMUNITY): Payer: Self-pay

## 2016-11-15 ENCOUNTER — Encounter (HOSPITAL_COMMUNITY): Payer: Self-pay

## 2016-11-20 ENCOUNTER — Other Ambulatory Visit: Payer: Self-pay | Admitting: Interventional Cardiology

## 2016-11-20 ENCOUNTER — Encounter (HOSPITAL_COMMUNITY): Payer: Self-pay | Attending: Interventional Cardiology

## 2016-11-20 DIAGNOSIS — I2510 Atherosclerotic heart disease of native coronary artery without angina pectoris: Secondary | ICD-10-CM | POA: Insufficient documentation

## 2016-11-22 ENCOUNTER — Encounter (HOSPITAL_COMMUNITY): Payer: Self-pay

## 2016-11-25 ENCOUNTER — Encounter (HOSPITAL_COMMUNITY): Payer: Self-pay

## 2016-11-27 ENCOUNTER — Encounter (HOSPITAL_COMMUNITY): Payer: Self-pay

## 2016-11-29 ENCOUNTER — Encounter (HOSPITAL_COMMUNITY): Payer: Self-pay

## 2016-12-02 ENCOUNTER — Encounter (HOSPITAL_COMMUNITY): Payer: Self-pay

## 2016-12-04 ENCOUNTER — Encounter (HOSPITAL_COMMUNITY): Payer: Self-pay

## 2016-12-06 ENCOUNTER — Telehealth (HOSPITAL_COMMUNITY): Payer: Self-pay | Admitting: *Deleted

## 2016-12-06 ENCOUNTER — Encounter (HOSPITAL_COMMUNITY): Admission: RE | Admit: 2016-12-06 | Payer: Self-pay | Source: Ambulatory Visit

## 2016-12-09 ENCOUNTER — Encounter (HOSPITAL_COMMUNITY): Payer: Self-pay

## 2016-12-11 ENCOUNTER — Encounter (HOSPITAL_COMMUNITY): Payer: Self-pay

## 2016-12-13 ENCOUNTER — Encounter (HOSPITAL_COMMUNITY): Payer: Self-pay

## 2016-12-16 ENCOUNTER — Encounter (HOSPITAL_COMMUNITY)
Admission: RE | Admit: 2016-12-16 | Discharge: 2016-12-16 | Disposition: A | Discharge status: Home / Self Care | Payer: Self-pay | Source: Ambulatory Visit | Attending: Interventional Cardiology | Admitting: Interventional Cardiology

## 2016-12-16 DIAGNOSIS — I2510 Atherosclerotic heart disease of native coronary artery without angina pectoris: Secondary | ICD-10-CM | POA: Insufficient documentation

## 2016-12-17 ENCOUNTER — Other Ambulatory Visit: Payer: Self-pay | Admitting: Interventional Cardiology

## 2016-12-18 ENCOUNTER — Encounter (HOSPITAL_COMMUNITY): Payer: Self-pay

## 2016-12-20 ENCOUNTER — Encounter (HOSPITAL_COMMUNITY)
Admission: RE | Admit: 2016-12-20 | Discharge: 2016-12-20 | Disposition: A | Discharge status: Home / Self Care | Payer: Self-pay | Source: Ambulatory Visit | Attending: Interventional Cardiology | Admitting: Interventional Cardiology

## 2016-12-23 ENCOUNTER — Encounter (HOSPITAL_COMMUNITY): Payer: Self-pay

## 2016-12-25 ENCOUNTER — Encounter (HOSPITAL_COMMUNITY): Payer: Self-pay

## 2016-12-27 ENCOUNTER — Encounter (HOSPITAL_COMMUNITY): Payer: Self-pay

## 2016-12-30 ENCOUNTER — Encounter (HOSPITAL_COMMUNITY): Payer: Self-pay

## 2017-01-01 ENCOUNTER — Ambulatory Visit (INDEPENDENT_AMBULATORY_CARE_PROVIDER_SITE_OTHER): Payer: Medicare Other | Admitting: Interventional Cardiology

## 2017-01-01 ENCOUNTER — Encounter (HOSPITAL_COMMUNITY): Payer: Self-pay

## 2017-01-01 ENCOUNTER — Encounter: Payer: Self-pay | Admitting: Interventional Cardiology

## 2017-01-01 VITALS — BP 158/90 | HR 68 | Ht 66.0 in | Wt 214.0 lb

## 2017-01-01 DIAGNOSIS — Z23 Encounter for immunization: Secondary | ICD-10-CM | POA: Diagnosis not present

## 2017-01-01 DIAGNOSIS — E782 Mixed hyperlipidemia: Secondary | ICD-10-CM | POA: Diagnosis not present

## 2017-01-01 DIAGNOSIS — I2510 Atherosclerotic heart disease of native coronary artery without angina pectoris: Secondary | ICD-10-CM

## 2017-01-01 DIAGNOSIS — I10 Essential (primary) hypertension: Secondary | ICD-10-CM

## 2017-01-01 NOTE — Progress Notes (Signed)
Cardiology Office Note   Date:  01/01/2017   ID:  [name], [name] 1940-03-29, MRN [id number]  PCP:  Burnard Bunting, MD    No chief complaint on file. CAD   Wt Readings from Last 3 Encounters:  01/01/17 214 lb (97.1 kg)  11/27/15 204 lb (92.5 kg)  11/18/14 193 lb 9.6 oz (87.8 kg)       History of Present Illness: [name] is a 76 y.o. female  with a hx of CAD status post inferoposterior STEMI 03/2013 treated with a BMS to the left circumflex, HL, HTN, IV dye allergy. Post PCI, she had recurrent nonsustained ventricular tachycardia. Relook cardiac catheterization demonstrated patent circumflex stent. Ejection fraction has been well-preserved. She has had persistent DOE since her MI without significant change. Echo in 06/2013 demonstrated normal LVF. PFTs were unremarkable in 2015. She has seen pulmonology as well.   Admitted to the hospital in 06/2014 with acute blood loss anemia from profound vaginal bleeding. Hemoglobin dropped to 7.9. She was transfused with 1 unit of PRBCs. Antiplatelet agents were stopped.  Echo showed mildly decreased left ventricular function. She ultimately underwent a stress test due to decreased ejection fraction. No ischemia was found.  Ultimately, she ahd a hystectomy in 2016.    She has gained weight.  She has not been exercising as much.  SHe has a lot of stress with her friend having cancer.  SHe thinks she is eating too much.  She has not been able to go to rehab as much , taking her friend to chemotherapy.  Her BP has increased with the weight gain.  Her lisinopril dose has ben increased.    Denies :  Dizziness. Leg edema. Nitroglycerin use. Orthopnea. Palpitations. Paroxysmal nocturnal dyspnea. Shortness of breath. Syncope.   She has had a few episodes of chest pressure at rest.  She has not had this with exertion.  Last episode was a week ago.  It was mild and she did not think she needed a NTG.  Sx resolved after a few minutes.   She did not consider going to the ER since the sx were mild.  It was not like her MI.    More DOE since she has been exercising less.       Past Medical History:  Diagnosis Date  . Arthritis    needs left knee replacement  . CAD (coronary artery disease)    a. 03/2013 Infpost STEMI/PCI: LM nl, LAD min irregs, LCX 100 (3.0x12 Vision BMS), RCA  mild ostial dzs, EF 55%.  . Essential hypertension, benign   . Fibroids    a. uterine - spotting noted since 03/11/2013.  Marland Kitchen GERD (gastroesophageal reflux disease)   . Hearing loss    a. s/p cochlear implant on right, hearing aid on left.  Marland Kitchen Hernia of anterior abdominal wall   . History of cardiovascular stress test    Lexiscan Myoview 8/16:  EF 58%, inferolateral scar, no ischemia; Low Risk  . Hyperlipidemia   . Hypothyroidism   . Ischemic cardiomyopathy    Echo 7/16: Septal and posterior lateral HK, EF 45-50%, mild LAE  . IV Contrast Allergy   . Obesity   . Rosacea   . Ventricular tachycardia ([place])    a. Immediate post pci/MI - no complications, on BB.    Past Surgical History:  Procedure Laterality Date  . CERVICAL POLYPECTOMY     2010  . CHOLECYSTECTOMY    . COCHLEAR IMPLANT    .  LEFT HEART CATHETERIZATION WITH CORONARY ANGIOGRAM N/A 03/25/2013   Procedure: LEFT HEART CATHETERIZATION WITH CORONARY ANGIOGRAM;  Surgeon: Burnell Blanks, MD;  Location: [facility] C[facility] LAB;  Service: Cardiovascular;  Laterality: N/A;  . ROBOTIC ASSISTED TOTAL HYSTERECTOMY WITH BILATERAL SALPINGO OOPHERECTOMY Bilateral 08/16/2014   Procedure: ROBOTIC ASSISTED TOTAL HYSTERECTOMY WITH BILATERAL SALPINGO OOPHORECTOMY ;  Surgeon: Everitt Amber, MD;  Location: [facility] ORS;  Service: Gynecology;  Laterality: Bilateral;  . UPPER GASTROINTESTINAL ENDOSCOPY    . uterine polyp     had D and C done to remove the polyp     Current Outpatient Prescriptions  Medication Sig Dispense Refill  . aspirin EC 81 MG tablet Take 81 mg by mouth daily.    Marland Kitchen atorvastatin (LIPITOR)  10 MG tablet Take 10 mg by mouth daily.    . Calcium Carb-Cholecalciferol (CALCIUM + D3) 600-200 MG-UNIT TABS Take 1 tablet by mouth every morning.     . diclofenac sodium (VOLTAREN) 1 % GEL Apply 2 g topically 2 (two) times daily. Applies to knees    . furosemide (LASIX) 20 MG tablet TAKE 1 TABLET ONCE DAILY. 30 tablet 6  . levothyroxine (SYNTHROID, LEVOTHROID) 100 MCG tablet Take 100 mcg by mouth daily before breakfast.    . lisinopril (PRINIVIL,ZESTRIL) 10 MG tablet Take 10 mg by mouth daily.    . metoprolol succinate (TOPROL-XL) 25 MG 24 hr tablet TAKE 1 TABLET DAILY. 30 tablet 11  . metroNIDAZOLE (METROGEL) 0.75 % gel Apply 1 application topically daily. Patient places on her cheeks and nose, only about once or twice a week depending on the climate.    . nitroGLYCERIN (NITROSTAT) 0.4 MG SL tablet Place 0.4 mg under the tongue every 5 (five) minutes as needed for chest pain.    . pantoprazole (PROTONIX) 40 MG tablet TAKE 1 TABLET ONCE DAILY. 30 tablet 11  . potassium chloride SA (K-DUR,KLOR-CON) 20 MEQ tablet TAKE 1 TABLET ONCE DAILY. 30 tablet 11  . rosuvastatin (CRESTOR) 10 MG tablet Take 10 mg by mouth 3 (three) times a week.     No current facility-administered medications for this visit.     Allergies:   Bee venom; Contrast media [iodinated diagnostic agents]; and Iohexol    Social History:  The patient  reports that she quit smoking about 48 years ago. Her smoking use included Cigarettes. She has a 0.40 pack-year smoking history. She has never used smokeless tobacco. She reports that she does not drink alcohol or use drugs.   Family History:  The patient's family history includes Cancer in her mother; Colon cancer in her cousin; Diabetes in her mother and sister; Heart attack in her maternal grandmother and mother; Heart disease in her mother; Heart failure in her mother; Hyperlipidemia in her sister; Hypertension in her mother; Other in her father.    ROS:  Please see the history  of present illness.   Otherwise, review of systems are positive for weight gain.   All other systems are reviewed and negative.    PHYSICAL EXAM: VS:  BP (!) 158/90 (BP Location: Right Arm, Patient Position: Sitting, Cuff Size: Normal)   Pulse 68   Ht 5\' 6"  (1.676 m)   Wt 214 lb (97.1 kg)   SpO2 96%   BMI 34.54 kg/m  , BMI Body mass index is 34.54 kg/m. GEN: Well nourished, well developed, in no acute distress  HEENT: normal  Neck: no JVD, carotid bruits, or masses Cardiac: RRR; no murmurs, rubs, or gallops,no edema  Respiratory:  clear to auscultation bilaterally, normal work of breathing GI: soft, nontender, nondistended, + BS MS: no deformity or atrophy  Skin: warm and dry, no rash Neuro:  Strength and sensation are intact Psych: euthymic mood, full affect   EKG:   The ekg ordered today demonstrates NSR, no ST changes   Recent Labs: No results found for requested labs within last 8760 hours.   Lipid Panel    Component Value Date/Time   CHOL 114 09/01/2013 1009   TRIG 78.0 09/01/2013 1009   HDL 41.80 09/01/2013 1009   CHOLHDL 3 09/01/2013 1009   VLDL 15.6 09/01/2013 1009   LDLCALC 57 09/01/2013 1009     Other studies Reviewed: Additional studies/ records that were reviewed today with results demonstrating: normal stress test 8/16.   ASSESSMENT AND PLAN:  1. CAD/Old MI: Bare metal stent used.  No Plavix due to prior bleeding problems. Question of angina.  Some atypical and typical features.  SHe will let us know if sx happen with exertion, or if she has sx like her prior MI.  Would plan for cath if she had sx similar to prior MI. Will see her back in 6 months rather than 1 year. 2. Hyperlipidemia: LDL 66 in 2017.  She decreased her lipid lowering therapy due to some aches and pains so she takes the Crestor 2-3 x/week.  It did not change the pains but she preferred taking the lower dose. 3. HTN: Controlled at cardiac rehab.  Elevated today.  SHould improve with  weight loss.  4. Flu shot given.  SHe requested the regular dose, rather than the high dose despite her age.      Current medicines are reviewed at length with the patient today.  The patient concerns regarding her medicines were addressed.  The following changes have been made:  No change  Labs/ tests ordered today include:  No orders of the defined types were placed in this encounter.   Recommend 150 minutes/week of aerobic exercise Low fat, low carb, high fiber diet recommended  Disposition:   FU in 1 year   Signed, Larae Grooms, MD  01/01/2017 4:32 PM    [facility] [address], [place], [place]  [address] Phone: [phone]; Fax: [phone]

## 2017-01-01 NOTE — Patient Instructions (Addendum)

## 2017-01-03 ENCOUNTER — Encounter (HOSPITAL_COMMUNITY): Payer: Self-pay

## 2017-01-06 ENCOUNTER — Encounter (HOSPITAL_COMMUNITY): Payer: Self-pay

## 2017-01-08 ENCOUNTER — Encounter (HOSPITAL_COMMUNITY): Payer: Self-pay

## 2017-01-10 ENCOUNTER — Encounter (HOSPITAL_COMMUNITY): Payer: Self-pay

## 2017-01-13 ENCOUNTER — Encounter (HOSPITAL_COMMUNITY): Payer: Self-pay

## 2017-01-15 ENCOUNTER — Encounter (HOSPITAL_COMMUNITY): Payer: Self-pay

## 2017-01-17 ENCOUNTER — Encounter (HOSPITAL_COMMUNITY): Payer: Self-pay

## 2017-01-19 ENCOUNTER — Other Ambulatory Visit: Payer: Self-pay | Admitting: Interventional Cardiology

## 2017-01-20 ENCOUNTER — Encounter (HOSPITAL_COMMUNITY): Payer: Self-pay | Attending: Interventional Cardiology

## 2017-01-20 DIAGNOSIS — I2510 Atherosclerotic heart disease of native coronary artery without angina pectoris: Secondary | ICD-10-CM | POA: Insufficient documentation

## 2017-01-22 ENCOUNTER — Encounter (HOSPITAL_COMMUNITY): Payer: Self-pay

## 2017-01-24 ENCOUNTER — Encounter (HOSPITAL_COMMUNITY): Payer: Self-pay

## 2017-01-27 ENCOUNTER — Encounter (HOSPITAL_COMMUNITY): Payer: Self-pay

## 2017-01-29 ENCOUNTER — Encounter (HOSPITAL_COMMUNITY): Payer: Self-pay

## 2017-01-29 DIAGNOSIS — Z23 Encounter for immunization: Secondary | ICD-10-CM | POA: Diagnosis not present

## 2017-01-29 DIAGNOSIS — L821 Other seborrheic keratosis: Secondary | ICD-10-CM | POA: Diagnosis not present

## 2017-01-29 DIAGNOSIS — L820 Inflamed seborrheic keratosis: Secondary | ICD-10-CM | POA: Diagnosis not present

## 2017-01-31 ENCOUNTER — Encounter (HOSPITAL_COMMUNITY): Payer: Self-pay

## 2017-02-03 ENCOUNTER — Encounter (HOSPITAL_COMMUNITY): Payer: Self-pay

## 2017-02-03 DIAGNOSIS — I10 Essential (primary) hypertension: Secondary | ICD-10-CM | POA: Diagnosis not present

## 2017-02-03 DIAGNOSIS — R82998 Other abnormal findings in urine: Secondary | ICD-10-CM | POA: Diagnosis not present

## 2017-02-03 DIAGNOSIS — E7849 Other hyperlipidemia: Secondary | ICD-10-CM | POA: Diagnosis not present

## 2017-02-03 DIAGNOSIS — E038 Other specified hypothyroidism: Secondary | ICD-10-CM | POA: Diagnosis not present

## 2017-02-05 ENCOUNTER — Encounter (HOSPITAL_COMMUNITY): Payer: Self-pay

## 2017-02-05 ENCOUNTER — Telehealth (HOSPITAL_COMMUNITY): Payer: Self-pay | Admitting: Internal Medicine

## 2017-02-10 ENCOUNTER — Encounter (HOSPITAL_COMMUNITY): Payer: Self-pay

## 2017-02-12 ENCOUNTER — Encounter (HOSPITAL_COMMUNITY): Payer: Self-pay

## 2017-02-13 DIAGNOSIS — J309 Allergic rhinitis, unspecified: Secondary | ICD-10-CM | POA: Diagnosis not present

## 2017-02-13 DIAGNOSIS — Z0000 Encounter for general adult medical examination without abnormal findings: Secondary | ICD-10-CM | POA: Diagnosis not present

## 2017-02-13 DIAGNOSIS — I10 Essential (primary) hypertension: Secondary | ICD-10-CM | POA: Diagnosis not present

## 2017-02-13 DIAGNOSIS — E038 Other specified hypothyroidism: Secondary | ICD-10-CM | POA: Diagnosis not present

## 2017-02-13 DIAGNOSIS — I213 ST elevation (STEMI) myocardial infarction of unspecified site: Secondary | ICD-10-CM | POA: Diagnosis not present

## 2017-02-13 DIAGNOSIS — Z6834 Body mass index (BMI) 34.0-34.9, adult: Secondary | ICD-10-CM | POA: Diagnosis not present

## 2017-02-13 DIAGNOSIS — E669 Obesity, unspecified: Secondary | ICD-10-CM | POA: Diagnosis not present

## 2017-02-13 DIAGNOSIS — E7849 Other hyperlipidemia: Secondary | ICD-10-CM | POA: Diagnosis not present

## 2017-02-13 DIAGNOSIS — I2510 Atherosclerotic heart disease of native coronary artery without angina pectoris: Secondary | ICD-10-CM | POA: Diagnosis not present

## 2017-02-13 DIAGNOSIS — K219 Gastro-esophageal reflux disease without esophagitis: Secondary | ICD-10-CM | POA: Diagnosis not present

## 2017-02-14 ENCOUNTER — Encounter (HOSPITAL_COMMUNITY): Payer: Self-pay

## 2017-02-14 DIAGNOSIS — Z1212 Encounter for screening for malignant neoplasm of rectum: Secondary | ICD-10-CM | POA: Diagnosis not present

## 2017-02-17 ENCOUNTER — Encounter (HOSPITAL_COMMUNITY): Payer: Self-pay

## 2017-02-19 ENCOUNTER — Encounter (HOSPITAL_COMMUNITY): Payer: Self-pay

## 2017-02-21 ENCOUNTER — Encounter (HOSPITAL_COMMUNITY): Payer: Self-pay

## 2017-02-24 ENCOUNTER — Encounter (HOSPITAL_COMMUNITY): Payer: Self-pay

## 2017-02-26 ENCOUNTER — Encounter (HOSPITAL_COMMUNITY): Payer: Self-pay

## 2017-02-28 ENCOUNTER — Encounter (HOSPITAL_COMMUNITY): Payer: Self-pay

## 2017-03-03 ENCOUNTER — Encounter (HOSPITAL_COMMUNITY): Payer: Self-pay

## 2017-03-05 ENCOUNTER — Encounter (HOSPITAL_COMMUNITY): Payer: Self-pay

## 2017-03-07 ENCOUNTER — Encounter (HOSPITAL_COMMUNITY): Payer: Self-pay

## 2017-03-12 ENCOUNTER — Encounter (HOSPITAL_COMMUNITY): Payer: Self-pay

## 2017-03-14 ENCOUNTER — Encounter (HOSPITAL_COMMUNITY): Payer: Self-pay

## 2017-03-17 ENCOUNTER — Encounter (HOSPITAL_COMMUNITY): Payer: Self-pay

## 2017-03-19 ENCOUNTER — Encounter (HOSPITAL_COMMUNITY): Payer: Self-pay

## 2017-03-21 ENCOUNTER — Encounter (HOSPITAL_COMMUNITY): Payer: Self-pay

## 2017-03-24 ENCOUNTER — Encounter (HOSPITAL_COMMUNITY): Payer: Self-pay

## 2017-03-26 ENCOUNTER — Encounter (HOSPITAL_COMMUNITY): Payer: Self-pay

## 2017-03-28 ENCOUNTER — Encounter (HOSPITAL_COMMUNITY): Payer: Self-pay

## 2017-03-31 ENCOUNTER — Encounter (HOSPITAL_COMMUNITY): Payer: Self-pay

## 2017-04-02 ENCOUNTER — Encounter (HOSPITAL_COMMUNITY): Payer: Self-pay

## 2017-04-04 ENCOUNTER — Encounter (HOSPITAL_COMMUNITY): Payer: Self-pay

## 2017-04-07 ENCOUNTER — Encounter (HOSPITAL_COMMUNITY): Payer: Self-pay

## 2017-04-09 ENCOUNTER — Encounter (HOSPITAL_COMMUNITY): Payer: Self-pay

## 2017-04-11 ENCOUNTER — Encounter (HOSPITAL_COMMUNITY): Payer: Self-pay

## 2017-04-14 ENCOUNTER — Encounter (HOSPITAL_COMMUNITY): Payer: Self-pay

## 2017-04-16 ENCOUNTER — Encounter (HOSPITAL_COMMUNITY): Payer: Self-pay

## 2017-04-18 ENCOUNTER — Encounter (HOSPITAL_COMMUNITY): Payer: Self-pay

## 2017-04-18 ENCOUNTER — Other Ambulatory Visit: Payer: Self-pay | Admitting: Interventional Cardiology

## 2017-04-21 ENCOUNTER — Encounter (HOSPITAL_COMMUNITY): Payer: Self-pay

## 2017-04-23 ENCOUNTER — Encounter (HOSPITAL_COMMUNITY): Payer: Self-pay

## 2017-04-25 ENCOUNTER — Encounter (HOSPITAL_COMMUNITY): Payer: Self-pay

## 2017-04-28 ENCOUNTER — Encounter (HOSPITAL_COMMUNITY): Payer: Self-pay

## 2017-04-30 ENCOUNTER — Encounter (HOSPITAL_COMMUNITY): Payer: Self-pay

## 2017-05-02 ENCOUNTER — Encounter (HOSPITAL_COMMUNITY): Payer: Self-pay

## 2017-05-05 ENCOUNTER — Encounter (HOSPITAL_COMMUNITY): Payer: Self-pay

## 2017-05-07 ENCOUNTER — Encounter (HOSPITAL_COMMUNITY): Payer: Self-pay

## 2017-05-09 ENCOUNTER — Encounter (HOSPITAL_COMMUNITY): Payer: Self-pay

## 2017-05-12 ENCOUNTER — Encounter (HOSPITAL_COMMUNITY): Payer: Self-pay

## 2017-05-14 ENCOUNTER — Encounter (HOSPITAL_COMMUNITY): Payer: Self-pay

## 2017-05-16 ENCOUNTER — Emergency Department (HOSPITAL_COMMUNITY)
Admission: EM | Admit: 2017-05-16 | Discharge: 2017-05-16 | Disposition: A | Discharge status: Home / Self Care | Payer: Medicare Other | Attending: Emergency Medicine | Admitting: Emergency Medicine

## 2017-05-16 ENCOUNTER — Emergency Department (HOSPITAL_COMMUNITY): Payer: Medicare Other

## 2017-05-16 ENCOUNTER — Encounter (HOSPITAL_COMMUNITY): Payer: Self-pay

## 2017-05-16 DIAGNOSIS — Z79899 Other long term (current) drug therapy: Secondary | ICD-10-CM | POA: Insufficient documentation

## 2017-05-16 DIAGNOSIS — I2510 Atherosclerotic heart disease of native coronary artery without angina pectoris: Secondary | ICD-10-CM | POA: Insufficient documentation

## 2017-05-16 DIAGNOSIS — E039 Hypothyroidism, unspecified: Secondary | ICD-10-CM | POA: Diagnosis not present

## 2017-05-16 DIAGNOSIS — R0609 Other forms of dyspnea: Secondary | ICD-10-CM

## 2017-05-16 DIAGNOSIS — I252 Old myocardial infarction: Secondary | ICD-10-CM

## 2017-05-16 DIAGNOSIS — R0602 Shortness of breath: Secondary | ICD-10-CM | POA: Diagnosis not present

## 2017-05-16 DIAGNOSIS — I10 Essential (primary) hypertension: Secondary | ICD-10-CM | POA: Diagnosis not present

## 2017-05-16 DIAGNOSIS — R030 Elevated blood-pressure reading, without diagnosis of hypertension: Secondary | ICD-10-CM | POA: Diagnosis not present

## 2017-05-16 DIAGNOSIS — J95859 Other complication of respirator [ventilator]: Secondary | ICD-10-CM | POA: Diagnosis not present

## 2017-05-16 DIAGNOSIS — I25118 Atherosclerotic heart disease of native coronary artery with other forms of angina pectoris: Secondary | ICD-10-CM | POA: Diagnosis not present

## 2017-05-16 DIAGNOSIS — Z87891 Personal history of nicotine dependence: Secondary | ICD-10-CM | POA: Diagnosis not present

## 2017-05-16 DIAGNOSIS — Z7982 Long term (current) use of aspirin: Secondary | ICD-10-CM | POA: Insufficient documentation

## 2017-05-16 DIAGNOSIS — R0789 Other chest pain: Secondary | ICD-10-CM | POA: Diagnosis not present

## 2017-05-16 LAB — BASIC METABOLIC PANEL
ANION GAP: 9 (ref 5–15)
BUN: 12 mg/dL (ref 6–20)
CHLORIDE: 106 mmol/L (ref 101–111)
CO2: 22 mmol/L (ref 22–32)
Calcium: 8.8 mg/dL — ABNORMAL LOW (ref 8.9–10.3)
Creatinine, Ser: 0.72 mg/dL (ref 0.44–1.00)
GFR calc Af Amer: >60 mL/min (ref >60)
Glucose, Bld: 201 mg/dL — ABNORMAL HIGH (ref 65–99)
POTASSIUM: 3.7 mmol/L (ref 3.5–5.1)
SODIUM: 137 mmol/L (ref 135–145)

## 2017-05-16 LAB — BRAIN NATRIURETIC PEPTIDE: B NATRIURETIC PEPTIDE 5: 358.9 pg/mL — AB (ref 0.0–100.0)

## 2017-05-16 LAB — CBC
HCT: 39.7 % (ref 36.0–46.0)
HEMOGLOBIN: 12.7 g/dL (ref 12.0–15.0)
MCH: 28.9 pg (ref 26.0–34.0)
MCHC: 32 g/dL (ref 30.0–36.0)
MCV: 90.2 fL (ref 78.0–100.0)
Platelets: 180 10*3/uL (ref 150–400)
RBC: 4.4 MIL/uL (ref 3.87–5.11)
RDW: 13.4 % (ref 11.5–15.5)
WBC: 5.3 10*3/uL (ref 4.0–10.5)

## 2017-05-16 LAB — I-STAT TROPONIN, ED: TROPONIN I, POC: 0.01 ng/mL (ref 0.00–0.08)

## 2017-05-16 LAB — D-DIMER, QUANTITATIVE (NOT AT ARMC): D DIMER QUANT: 1.7 ug{FEU}/mL — AB (ref 0.00–0.50)

## 2017-05-16 MED ORDER — DIPHENHYDRAMINE HCL 25 MG PO CAPS: 50.0000 mg | ORAL_CAPSULE | Freq: Once | ORAL | Status: AC

## 2017-05-16 MED ORDER — DIPHENHYDRAMINE HCL 50 MG/ML IJ SOLN
50.0000 mg | Freq: Once | INTRAMUSCULAR | Status: AC
  Administered 2017-05-16: 50 mg via INTRAVENOUS
  Filled 2017-05-16: qty 1

## 2017-05-16 MED ORDER — HYDROCORTISONE NA SUCCINATE PF 250 MG IJ SOLR
200.0000 mg | Freq: Once | INTRAMUSCULAR | Status: AC
  Administered 2017-05-16: 200 mg via INTRAVENOUS
  Filled 2017-05-16: qty 200

## 2017-05-16 MED ORDER — ALBUTEROL SULFATE (2.5 MG/3ML) 0.083% IN NEBU: 5.0000 mg | INHALATION_SOLUTION | Freq: Once | RESPIRATORY_TRACT | Status: DC

## 2017-05-16 MED ORDER — IOPAMIDOL (ISOVUE-370) INJECTION 76%
INTRAVENOUS | Status: AC
  Administered 2017-05-16: 100 mL
  Filled 2017-05-16: qty 100

## 2017-05-16 MED ORDER — METOPROLOL TARTRATE 5 MG/5ML IV SOLN
5.0000 mg | Freq: Once | INTRAVENOUS | Status: DC
  Filled 2017-05-16: qty 5

## 2017-05-16 NOTE — ED Notes (Signed)
Pt ambulated on pulse ox. O2 saturation stayed between 98-100% for the majority of the time she was up and walking around. O2 saturation would drop to 97% or 96% for a short moment and return back to 98%. Pts gait was steady and she had no complaints.

## 2017-05-16 NOTE — ED Notes (Signed)
Pt ambulated to restroom with steady gait. Pt updated to delay, benadryl to be administered at approx 1923. Pt verbalized understanding

## 2017-05-16 NOTE — ED Notes (Signed)
Per main lab, will add on D-dimer

## 2017-05-16 NOTE — ED Notes (Signed)
Pt expressed concern over potential delayed allergic reaction to contrast. Pt instructed to take 50 mg of benadryl if hives occur and to call 911 in the event of a severe allergic reaction or if any swelling of the throat occurs.

## 2017-05-16 NOTE — Consult Note (Signed)
Cardiology Consultation:   Patient ID: [name]; [id number]; 02-Dec-1940   Admit date: 05/16/2017 Date of Consult: 05/16/2017  Primary Care Provider: Burnard Bunting, MD Primary Cardiologist: Irish Lack Primary Electrophysiologist:     Patient Profile:   [name] is a 77 y.o. female with a hx of CAD/MI who is being seen today for the evaluation of shortness of breath at the request of Benedetto Goad.  History of Present Illness:   [name] had a lateral MI a few years ago.  SHe has had chronic shortness of breath.  It is worse with walking up hill.  Today, she noted some elevated BP readings.  THis made her nervous and repeat checks were higher.  SHe then developed some shortness of breath.    Currently, she feels well. No chest pain.  No sx like what she had before the MI.  Denies : Chest pain. Dizziness. Leg edema. Nitroglycerin use. Orthopnea. Palpitations. Paroxysmal nocturnal dyspnea. Syncope.   She has not been taking her lasix regularly.  Past Medical History:  Diagnosis Date  . Arthritis    needs left knee replacement  . CAD (coronary artery disease)    a. 03/2013 Infpost STEMI/PCI: LM nl, LAD min irregs, LCX 100 (3.0x12 Vision BMS), RCA  mild ostial dzs, EF 55%.  . Essential hypertension, benign   . Fibroids    a. uterine - spotting noted since 03/11/2013.  Marland Kitchen GERD (gastroesophageal reflux disease)   . Hearing loss    a. s/p cochlear implant on right, hearing aid on left.  Marland Kitchen Hernia of anterior abdominal wall   . History of cardiovascular stress test    Lexiscan Myoview 8/16:  EF 58%, inferolateral scar, no ischemia; Low Risk  . Hyperlipidemia   . Hypothyroidism   . Ischemic cardiomyopathy    Echo 7/16: Septal and posterior lateral HK, EF 45-50%, mild LAE  . IV Contrast Allergy   . Obesity   . Rosacea   . Ventricular tachycardia ([place])    a. Immediate post pci/MI - no complications, on BB.    Past Surgical History:  Procedure Laterality Date  .  CERVICAL POLYPECTOMY     2010  . CHOLECYSTECTOMY    . COCHLEAR IMPLANT    . LEFT HEART CATHETERIZATION WITH CORONARY ANGIOGRAM N/A 03/25/2013   Procedure: LEFT HEART CATHETERIZATION WITH CORONARY ANGIOGRAM;  Surgeon: Burnell Blanks, MD;  Location: [facility] C[facility] LAB;  Service: Cardiovascular;  Laterality: N/A;  . ROBOTIC ASSISTED TOTAL HYSTERECTOMY WITH BILATERAL SALPINGO OOPHERECTOMY Bilateral 08/16/2014   Procedure: ROBOTIC ASSISTED TOTAL HYSTERECTOMY WITH BILATERAL SALPINGO OOPHORECTOMY ;  Surgeon: Everitt Amber, MD;  Location: [facility] ORS;  Service: Gynecology;  Laterality: Bilateral;  . UPPER GASTROINTESTINAL ENDOSCOPY    . uterine polyp     had D and C done to remove the polyp     Home Medications:  Prior to Admission medications   Medication Sig Start Date End Date Taking? Authorizing Provider  aspirin EC 81 MG tablet Take 81 mg by mouth daily.   Yes [provider]  Calcium Carb-Cholecalciferol (CALCIUM + D3) 600-200 MG-UNIT TABS Take 1 tablet by mouth every morning.    Yes [provider]  Cyanocobalamin (VITAMIN B 12 PO) Take 1,000 tablets by mouth daily.   Yes [provider]  diclofenac sodium (VOLTAREN) 1 % GEL Apply 2 g topically 2 (two) times daily. Applies to knees   Yes [provider]  furosemide (LASIX) 20 MG tablet TAKE 1 TABLET ONCE DAILY. 03/25/16  Yes Jettie Booze, MD  levothyroxine (SYNTHROID, LEVOTHROID) 100 MCG tablet Take 100 mcg by mouth daily before breakfast.   Yes [provider]  lisinopril (PRINIVIL,ZESTRIL) 10 MG tablet Take 10 mg by mouth daily. 12/17/16  Yes [provider]  metoprolol succinate (TOPROL-XL) 25 MG 24 hr tablet TAKE 1 TABLET DAILY. 12/18/16  Yes Jettie Booze, MD  metroNIDAZOLE (METROGEL) 0.75 % gel Apply 1 application topically daily. Patient places on her cheeks and nose, only about once or twice a week depending on the climate.   Yes [provider]  NITROSTAT 0.4 MG SL  tablet DISSOLVE 1 TABLET UNDER TONGUE AS NEEDED FOR CHEST PAIN,MAY REPEAT IN5 MINUTES FOR 2 DOSES. 04/18/17  Yes Jettie Booze, MD  pantoprazole (PROTONIX) 40 MG tablet Take one (1) tablet (40 mg) by mouth daily. 01/20/17  Yes Jettie Booze, MD  potassium chloride SA (K-DUR,KLOR-CON) 20 MEQ tablet TAKE 1 TABLET ONCE DAILY. 12/26/15  Yes Jettie Booze, MD  rosuvastatin (CRESTOR) 10 MG tablet Take 10 mg by mouth 3 (three) times a week. 12/17/16  Yes [provider]    Inpatient Medications: Scheduled Meds: . diphenhydrAMINE  50 mg Oral Once   Or  . diphenhydrAMINE  50 mg Intravenous Once   Continuous Infusions:  PRN Meds:   Allergies:    Allergies  Allergen Reactions  . Bee Venom Itching and Swelling  . Contrast Media [Iodinated Diagnostic Agents] Hives    dye  . Iohexol Hives    Social History:   Social History   Socioeconomic History  . Marital status: Divorced    Spouse name: Not on file  . Number of children: Not on file  . Years of education: Not on file  . Highest education level: Not on file  Social Needs  . Financial resource strain: Not on file  . Food insecurity - worry: Not on file  . Food insecurity - inability: Not on file  . Transportation needs - medical: Not on file  . Transportation needs - non-medical: Not on file  Occupational History  . Occupation: retired from Data processing manager work  Tobacco Use  . Smoking status: Former Smoker    Packs/day: 0.20    Years: 2.00    Pack years: 0.40    Types: Cigarettes    Last attempt to quit: 03/18/1968    Years since quitting: 49.1  . Smokeless tobacco: Never Used  Substance and Sexual Activity  . Alcohol use: No  . Drug use: No  . Sexual activity: Not on file  Other Topics Concern  . Not on file  Social History Narrative   Lives in [place] by herself.  Sister is nearby and is Healthcare POA.    Family History:    Family History  Problem Relation Age of Onset  . Heart failure Mother         died in her 15's.  . Cancer Mother   . Diabetes Mother   . Heart disease Mother   . Hypertension Mother   . Heart attack Mother   . Hyperlipidemia Sister   . Diabetes Sister   . Other Father        died in WWII  . Colon cancer Cousin   . Heart attack Maternal Grandmother   . Esophageal cancer Neg Hx   . Stomach cancer Neg Hx   . Stroke Neg Hx      ROS:  Please see the history of present illness.  Increased BP and  DOE All other ROS reviewed and negative.     Physical Exam/Data:   Vitals:   05/16/17 1600 05/16/17 1630 05/16/17 1700 05/16/17 1730  BP: (!) 182/81 (!) 184/72 (!) 168/69 (!) 163/86  Pulse: 71 76 75 68  Resp: 15 14 11 15   Temp:      TempSrc:      SpO2: 97% 96% 98% 98%  Weight:      Height:       No intake or output data in the 24 hours ending 05/16/17 1802 Filed Weights   05/16/17 1257  Weight: 211 lb (95.7 kg)   Body mass index is 34.06 kg/m.  General:  Well nourished, well developed, in no acute distress HEENT: normal Lymph: no adenopathy Neck: no JVD Endocrine:  No thryomegaly Vascular: No carotid bruits; FA pulses 2+ bilaterally without bruits  Cardiac:  normal S1, S2; RRR; no murmur  Lungs:  clear to auscultation bilaterally, no wheezing, rhonchi or rales  Abd: soft, nontender, no hepatomegaly  Ext: no edema Musculoskeletal:  No deformities, BUE and BLE strength normal and equal Skin: warm and dry  Neuro:  CNs 2-12 intact, no focal abnormalities noted Psych:  Normal affect   EKG:  The EKG was personally reviewed and demonstrates:  NSR, no significant ST segment changes Telemetry:  Telemetry was personally reviewed and demonstrates:  NSR  Relevant CV Studies:  Echo: EF 45-50% Laboratory Data:  Chemistry Recent Labs  Lab 05/16/17 1326  NA 137  K 3.7  CL 106  CO2 22  GLUCOSE 201*  BUN 12  CREATININE 0.72  CALCIUM 8.8*  GFRNONAA >60  GFRAA >60  ANIONGAP 9    No results for input(s): PROT, ALBUMIN, AST, ALT, ALKPHOS,  BILITOT in the last 168 hours. Hematology Recent Labs  Lab 05/16/17 1326  WBC 5.3  RBC 4.40  HGB 12.7  HCT 39.7  MCV 90.2  MCH 28.9  MCHC 32.0  RDW 13.4  PLT 180   Cardiac EnzymesNo results for input(s): TROPONINI in the last 168 hours.  Recent Labs  Lab 05/16/17 1417  TROPIPOC 0.01    BNP Recent Labs  Lab 05/16/17 1358  BNP 358.9*    DDimer  Recent Labs  Lab 05/16/17 1501  DDIMER 1.70*    Radiology/Studies:  Dg Chest 2 View  Result Date: 05/16/2017 CLINICAL DATA:  Shortness of Breath chest tightness EXAM: CHEST  2 VIEW COMPARISON:  September 28, 2014 FINDINGS: There is no edema or consolidation. The heart is upper normal in size with pulmonary vascularity within normal limits. No adenopathy. There is degenerative change in the lower thoracic spine. IMPRESSION: No edema or consolidation. Electronically Signed   By: Lowella Grip III M.D.   On: 05/16/2017 13:38    Assessment and Plan:   1. DOE: No objective evidence of ischemia.  Sx better.  May have been related to mild volume overload given her increased BNP.  I asked her to start taking her Lasix regularly.  She felt some back pain after Lasix in the past.  If this persists, could switch to HCTZ. 2. CAD/Old MI:  Sx are different than her prior angina. 3. HTN: It appears that lisinopril is at 10 mg daily.  WOuld increase to 20 mg daily to help with BP control.   4. If two troponins are negative, and she has a negative chest CT and she continues to feel well, would be ok to discharge.   For questions or updates, please contact [place] Please  consult [email or web address] for contact info under Cardiology/STEMI.   SignedLarae Grooms, MD  05/16/2017 6:02 PM

## 2017-05-16 NOTE — ED Notes (Signed)
Patient transported to CT 

## 2017-05-16 NOTE — ED Triage Notes (Signed)
Pt with hx of MI from home via EMS for SOB x 1 week worsening today. Pt denied CP but reported lightheadedness, HTN, and states her sx felt similar to last MI. Pt took 1 NTG for BP. EMS VS: 180/79, 84 HR, 99% on RA. 20 G LAC. EMS EKG WDL. Pt A&Ox4.

## 2017-05-16 NOTE — Discharge Instructions (Signed)
Follow-up with your cardiologist return here as needed your CT scan did not show any signs of blood clot

## 2017-05-16 NOTE — ED Notes (Signed)
EDP updated that pt and family asking for results of CT scan

## 2017-05-16 NOTE — ED Provider Notes (Signed)
[facility] EMERGENCY DEPARTMENT Provider Note   CSN: [id number] Arrival date & time: 05/16/17  1249     History   Chief Complaint Chief Complaint  Patient presents with  . Shortness of Breath    HPI [name] is a 77 y.o. female.  [name] is a 77 y.o. Female with history of CAD with stent placed in 2015, ischemic cardiomyopathy, hypertension, hyperlipidemia, hypothyroidism and GERD, who presents to the ED for evaluation of shortness of breath over the past week.  Patient reports last weekend she was helping her demented neighbor who got lost, after walking down and then up a hill patient became extremely short of breath and reports she was "gasping for air".  She reports ever since then she has not felt normal and she is continued to have shortness of breath, primarily with exertion but occasionally at rest as well.  She reports this is been present throughout the week, but she felt like it was a bit worse this morning.  Patient denies having chest pain, but does report some intermittent pressure and tightness, no wheezing.  No cough, fevers or chills.  Patient reports compliance with her blood pressure medications and Lasix, but reports over the past week she feels like she is gained some weight, and noticed more swelling in her legs, although reports this usually improves at the end of the day when she puts her feet up.  Patient denies unilateral leg swelling, calf tenderness, exogenous estrogen use, recent surgery, long distance travel, history of DVT or PE or family history of bleeding or clotting disorder.  Patient denies any abdominal pain, nausea, vomiting or diarrhea.   Patient is followed by Dr. Irish Lack with cardiology, last echo and stress test done in 2016, EF 45-50% with septal and lateral hypokinesis, no new defects noted on perfusion scan.  She reports she had been doing well since her previous MI, but over the summer she discontinued cardiac  rehab due to bilateral knee problems, patient reports since then she is gained weight and had increasing difficulty controlling her blood pressure, reports it was elevated today and she kept checking it and noticing it to be higher and higher this morning causing her to call EMS, she also reports this made her extremely anxious.      Past Medical History:  Diagnosis Date  . Arthritis    needs left knee replacement  . CAD (coronary artery disease)    a. 03/2013 Infpost STEMI/PCI: LM nl, LAD min irregs, LCX 100 (3.0x12 Vision BMS), RCA  mild ostial dzs, EF 55%.  . Essential hypertension, benign   . Fibroids    a. uterine - spotting noted since 03/11/2013.  Marland Kitchen GERD (gastroesophageal reflux disease)   . Hearing loss    a. s/p cochlear implant on right, hearing aid on left.  Marland Kitchen Hernia of anterior abdominal wall   . History of cardiovascular stress test    Lexiscan Myoview 8/16:  EF 58%, inferolateral scar, no ischemia; Low Risk  . Hyperlipidemia   . Hypothyroidism   . Ischemic cardiomyopathy    Echo 7/16: Septal and posterior lateral HK, EF 45-50%, mild LAE  . IV Contrast Allergy   . Obesity   . Rosacea   . Ventricular tachycardia ([place])    a. Immediate post pci/MI - no complications, on BB.    Patient Active Problem List   Diagnosis Date Noted  . Post-menopausal bleeding 08/17/2014  . Postmenopausal bleeding 08/16/2014  . Amnesia, global,  transient   . Vaginal bleeding 06/26/2014  . Retrograde amnesia 06/26/2014  . Acute post-hemorrhagic anemia 06/26/2014  . Near syncope 06/26/2014  . Old MI (myocardial infarction) 03/03/2014  . DOE (dyspnea on exertion) 07/30/2013  . Edema 04/27/2013  . Acute MI, inferoposterior wall, initial episode of care ([place]) 03/28/2013  . CAD (coronary artery disease) 03/28/2013  . Hypothyroidism 03/28/2013  . Hyperlipidemia 03/28/2013  . IV Contrast Allergy   . Rosacea   . Obesity   . Ventricular tachycardia ([place])   . Acute inferolateral  myocardial infarction ([place]) 03/24/2013  . Acute myocardial infarction of other inferior wall, initial episode of care   . Essential hypertension, benign   . GERD 11/19/2007    Past Surgical History:  Procedure Laterality Date  . CERVICAL POLYPECTOMY     2010  . CHOLECYSTECTOMY    . COCHLEAR IMPLANT    . LEFT HEART CATHETERIZATION WITH CORONARY ANGIOGRAM N/A 03/25/2013   Procedure: LEFT HEART CATHETERIZATION WITH CORONARY ANGIOGRAM;  Surgeon: Burnell Blanks, MD;  Location: [facility] CATH LAB;  Service: Cardiovascular;  Laterality: N/A;  . ROBOTIC ASSISTED TOTAL HYSTERECTOMY WITH BILATERAL SALPINGO OOPHERECTOMY Bilateral 08/16/2014   Procedure: ROBOTIC ASSISTED TOTAL HYSTERECTOMY WITH BILATERAL SALPINGO OOPHORECTOMY ;  Surgeon: Everitt Amber, MD;  Location: [facility] ORS;  Service: Gynecology;  Laterality: Bilateral;  . UPPER GASTROINTESTINAL ENDOSCOPY    . uterine polyp     had D and C done to remove the polyp    OB History    No data available       Home Medications    Prior to Admission medications   Medication Sig Start Date End Date Taking? Authorizing Provider  aspirin EC 81 MG tablet Take 81 mg by mouth daily.    [provider]  atorvastatin (LIPITOR) 10 MG tablet Take 10 mg by mouth daily.    [provider]  Calcium Carb-Cholecalciferol (CALCIUM + D3) 600-200 MG-UNIT TABS Take 1 tablet by mouth every morning.     [provider]  diclofenac sodium (VOLTAREN) 1 % GEL Apply 2 g topically 2 (two) times daily. Applies to knees    [provider]  furosemide (LASIX) 20 MG tablet TAKE 1 TABLET ONCE DAILY. 03/25/16   Jettie Booze, MD  levothyroxine (SYNTHROID, LEVOTHROID) 100 MCG tablet Take 100 mcg by mouth daily before breakfast.    [provider]  lisinopril (PRINIVIL,ZESTRIL) 10 MG tablet Take 10 mg by mouth daily. 12/17/16   [provider]  metoprolol succinate (TOPROL-XL) 25 MG 24 hr tablet TAKE 1 TABLET DAILY. 12/18/16    Jettie Booze, MD  metroNIDAZOLE (METROGEL) 0.75 % gel Apply 1 application topically daily. Patient places on her cheeks and nose, only about once or twice a week depending on the climate.    [provider]  NITROSTAT 0.4 MG SL tablet DISSOLVE 1 TABLET UNDER TONGUE AS NEEDED FOR CHEST PAIN,MAY REPEAT IN5 MINUTES FOR 2 DOSES. 04/18/17   Jettie Booze, MD  pantoprazole (PROTONIX) 40 MG tablet Take one (1) tablet (40 mg) by mouth daily. 01/20/17   Jettie Booze, MD  potassium chloride SA (K-DUR,KLOR-CON) 20 MEQ tablet TAKE 1 TABLET ONCE DAILY. 12/26/15   Jettie Booze, MD  rosuvastatin (CRESTOR) 10 MG tablet Take 10 mg by mouth 3 (three) times a week. 12/17/16   [provider]    Family History Family History  Problem Relation Age of Onset  . Heart failure Mother  died in her 52's.  . Cancer Mother   . Diabetes Mother   . Heart disease Mother   . Hypertension Mother   . Heart attack Mother   . Hyperlipidemia Sister   . Diabetes Sister   . Other Father        died in WWII  . Colon cancer Cousin   . Heart attack Maternal Grandmother   . Esophageal cancer Neg Hx   . Stomach cancer Neg Hx   . Stroke Neg Hx     Social History Social History   Tobacco Use  . Smoking status: Former Smoker    Packs/day: 0.20    Years: 2.00    Pack years: 0.40    Types: Cigarettes    Last attempt to quit: 03/18/1968    Years since quitting: 49.1  . Smokeless tobacco: Never Used  Substance Use Topics  . Alcohol use: No  . Drug use: No     Allergies   Bee venom; Contrast media [iodinated diagnostic agents]; and Iohexol   Review of Systems Review of Systems  Constitutional: Negative for chills.  HENT: Negative for congestion, rhinorrhea and sore throat.   Eyes: Negative for visual disturbance.  Respiratory: Positive for chest tightness and shortness of breath. Negative for cough and wheezing.   Cardiovascular: Positive for leg swelling.  Negative for chest pain and palpitations.  Gastrointestinal: Negative for abdominal pain, diarrhea, nausea and vomiting.  Genitourinary: Negative for dysuria, flank pain, frequency and hematuria.  Musculoskeletal: Negative for arthralgias and myalgias.  Skin: Negative for pallor and rash.  Neurological: Negative for dizziness, syncope, weakness and headaches.     Physical Exam Updated Vital Signs BP (!) 183/86   Pulse 84   Temp 98.3 F (36.8 C) (Oral)   Resp 13   Ht 5\' 6"  (1.676 m)   Wt 95.7 kg (211 lb)   SpO2 98%   BMI 34.06 kg/m   Physical Exam  Constitutional: She appears well-developed and well-nourished. No distress.  HENT:  Head: Normocephalic and atraumatic.  Mouth/Throat: Oropharynx is clear and moist.  Eyes: EOM are normal. Pupils are equal, round, and reactive to light. Right eye exhibits no discharge. Left eye exhibits no discharge.  Neck: Neck supple.  Cardiovascular: Normal rate, regular rhythm, normal heart sounds and intact distal pulses.  Pulses:      Radial pulses are 2+ on the right side, and 2+ on the left side.       Dorsalis pedis pulses are 2+ on the right side, and 2+ on the left side.  Pulmonary/Chest: Effort normal and breath sounds normal. No stridor. No respiratory distress. She has no wheezes. She has no rales.  Abdominal: Soft. Bowel sounds are normal. She exhibits no distension and no mass. There is no tenderness. There is no guarding.  Musculoskeletal: She exhibits edema. She exhibits no deformity.  1+ edema up to the level of shin, equal bilaterally, no calf tenderness  Neurological: She is alert. Coordination normal.  Skin: Skin is warm and dry. Capillary refill takes less than 2 seconds. She is not diaphoretic.  Psychiatric: She has a normal mood and affect. Her behavior is normal.  Nursing note and vitals reviewed.    ED Treatments / Results  Labs (all labs ordered are listed, but only abnormal results are displayed) Labs Reviewed    BASIC METABOLIC PANEL - Abnormal; Notable for the following components:      Result Value   Glucose, Bld 201 (*)    Calcium  8.8 (*)    All other components within normal limits  BRAIN NATRIURETIC PEPTIDE - Abnormal; Notable for the following components:   B Natriuretic Peptide 358.9 (*)    All other components within normal limits  D-DIMER, QUANTITATIVE (NOT AT [facility]) - Abnormal; Notable for the following components:   D-Dimer, Quant 1.70 (*)    All other components within normal limits  CBC  I-STAT TROPONIN, ED    EKG  EKG Interpretation  Date/Time:  Friday May 16 2017 12:55:56 EST Ventricular Rate:  94 PR Interval:    QRS Duration: 101 QT Interval:  364 QTC Calculation: 456 R Axis:   12 Text Interpretation:  Sinus rhythm Atrial premature complex Confirmed by Milton Ferguson [phone]) on 05/16/2017 3:23:39 PM       Radiology Dg Chest 2 View  Result Date: 05/16/2017 CLINICAL DATA:  Shortness of Breath chest tightness EXAM: CHEST  2 VIEW COMPARISON:  September 28, 2014 FINDINGS: There is no edema or consolidation. The heart is upper normal in size with pulmonary vascularity within normal limits. No adenopathy. There is degenerative change in the lower thoracic spine. IMPRESSION: No edema or consolidation. Electronically Signed   By: Lowella Grip III M.D.   On: 05/16/2017 13:38    Procedures Procedures (including critical care time)  Medications Ordered in ED Medications  diphenhydrAMINE (BENADRYL) capsule 50 mg (not administered)    Or  diphenhydrAMINE (BENADRYL) injection 50 mg (not administered)  hydrocortisone sodium succinate (SOLU-CORTEF) injection 200 mg (200 mg Intravenous Given 05/16/17 1623)     Initial Impression / Assessment and Plan / ED Course  I have reviewed the triage vital signs and the nursing notes.  Pertinent labs & imaging results that were available during my care of the patient were reviewed by me and considered in my medical decision making (see  chart for details).  Pt presents with worsening dyspnea on exertion over the past week.  Patient denies associated chest pain but does report some intermittent pressure and tightness, denies radiation to the arm, neck or jaws.  No associated emesis, diaphoresis, abdominal pain.  On initial evaluation patient is hypertensive, took her medications this morning, but also reports she was extremely anxious prior to calling the ambulance, vitals otherwise normal.  Normal work of breathing at rest, no evidence of respiratory distress, lungs clear to auscultation, heart with RRR, and small amount of edema in bilateral lower extremities. Will get EKG, CXR, basic labs, trop, BNP and d-dimer.  EKG shows sinus rhythm with PACs, initial trop negative, normal hgb, no leukocytosis, glucose elevated at 201, but no other electrolyte derangements. CXR without edema or other active cardiopulmonary disease. D-dimer and BNP pending, but given previous stenting with new dyspnea on exertion discussed with Dr. Roderic Palau and will go ahead and place cardiology consult. Consult placed @ 1502.  BNP elevated at 358.9, no prior available, pt currently taking lasix reports compliance, but increased swelling in lower legs over past week. D-dimer 1.70, will get CT PE study, pt has contrast allergy and will require delayed scan with premedication. Discussed this with pt she is aware of plan for premedication and wait for scan.  4:14 PM Spoke with Trish with cardiology, cards to see and evaluate the patient  Shift change care signed out to [facility] PA-C who will follow up on CT PE study results, as well as recommendations from cardiology consult and dispo appropriately.  Currently patient is comfortable with stable vitals, pt continues to be hypertensive but systolic has  improved to 160s w/o intervention.  Final Clinical Impressions(s) / ED Diagnoses   Final diagnoses:  SOB (shortness of breath)  Exertional dyspnea    ED Discharge  Orders    None       Jacqlyn Larsen, [place] 05/16/17 1712    Milton Ferguson, MD 05/19/17 [phone]

## 2017-05-16 NOTE — ED Notes (Signed)
CT called and notified that pt given ordered solu-cortef at [facility]

## 2017-05-16 NOTE — ED Notes (Addendum)
CT notified of benadryl  administration

## 2017-05-16 NOTE — ED Notes (Signed)
Patient transported to X-ray 

## 2017-05-16 NOTE — ED Notes (Signed)
Dr Zammit at bedside. 

## 2017-05-26 DIAGNOSIS — K219 Gastro-esophageal reflux disease without esophagitis: Secondary | ICD-10-CM | POA: Diagnosis not present

## 2017-05-26 DIAGNOSIS — D6489 Other specified anemias: Secondary | ICD-10-CM | POA: Diagnosis not present

## 2017-05-26 DIAGNOSIS — I10 Essential (primary) hypertension: Secondary | ICD-10-CM | POA: Diagnosis not present

## 2017-05-26 DIAGNOSIS — E7849 Other hyperlipidemia: Secondary | ICD-10-CM | POA: Diagnosis not present

## 2017-05-26 DIAGNOSIS — I2510 Atherosclerotic heart disease of native coronary artery without angina pectoris: Secondary | ICD-10-CM | POA: Diagnosis not present

## 2017-05-26 DIAGNOSIS — E038 Other specified hypothyroidism: Secondary | ICD-10-CM | POA: Diagnosis not present

## 2017-05-26 DIAGNOSIS — J309 Allergic rhinitis, unspecified: Secondary | ICD-10-CM | POA: Diagnosis not present

## 2017-05-26 DIAGNOSIS — I213 ST elevation (STEMI) myocardial infarction of unspecified site: Secondary | ICD-10-CM | POA: Diagnosis not present

## 2017-05-26 DIAGNOSIS — E669 Obesity, unspecified: Secondary | ICD-10-CM | POA: Diagnosis not present

## 2017-06-16 ENCOUNTER — Other Ambulatory Visit: Payer: Self-pay | Admitting: Interventional Cardiology

## 2017-06-30 NOTE — Progress Notes (Signed)
Cardiology Office Note   Date:  07/01/2017   ID:  Kimberly, [name] February 18, 1941, MRN [id number]  PCP:  Burnard Bunting, MD    No chief complaint on file.  CAD/Old MI  Wt Readings from Last 3 Encounters:  07/01/17 216 lb 12.8 oz (98.3 kg)  05/16/17 211 lb (95.7 kg)  01/01/17 214 lb (97.1 kg)       History of Present Illness: [name] is a 77 y.o. female  with a hx of CAD status post inferoposterior STEMI 03/2013 treated with a BMS to the left circumflex, HL, HTN, IV dye allergy. Post PCI, she had recurrent nonsustained ventricular tachycardia. Relook cardiac catheterization demonstrated patent circumflex stent. Ejection fraction has been well-preserved. She has had persistent DOE since her MI without significant change. Echo in 06/2013 demonstrated normal LVF. PFTs were unremarkable in 2015. She has seen pulmonology as well.   Admitted to the hospital in 06/2014 with acute blood loss anemia from profound vaginal bleeding. Hemoglobin dropped to 7.9. She was transfused with 1 unit of PRBCs. Antiplatelet agents were stopped. Echo showed mildly decreased left ventricular function. She ultimately underwent a stress test due to decreased ejection fraction. No ischemia was found. Ultimately, she had a hystectomy in 2016.   She had chest pain in 3/19 and was evaluated in the ER.  Workup was negative.    Her lisinopril was increased in 3/19 and she has felt better.    She has had a lot stressors with friends being ill and dying.    Knee pain limits her exercise.  SHe needs knee replacement but is trying to avoid this.      Past Medical History:  Diagnosis Date  . Arthritis    needs left knee replacement  . CAD (coronary artery disease)    a. 03/2013 Infpost STEMI/PCI: LM nl, LAD min irregs, LCX 100 (3.0x12 Vision BMS), RCA  mild ostial dzs, EF 55%.  . Essential hypertension, benign   . Fibroids    a. uterine - spotting noted since 03/11/2013.  Marland Kitchen GERD  (gastroesophageal reflux disease)   . Hearing loss    a. s/p cochlear implant on right, hearing aid on left.  Marland Kitchen Hernia of anterior abdominal wall   . History of cardiovascular stress test    Lexiscan Myoview 8/16:  EF 58%, inferolateral scar, no ischemia; Low Risk  . Hyperlipidemia   . Hypothyroidism   . Ischemic cardiomyopathy    Echo 7/16: Septal and posterior lateral HK, EF 45-50%, mild LAE  . IV Contrast Allergy   . Obesity   . Rosacea   . Ventricular tachycardia ([place])    a. Immediate post pci/MI - no complications, on BB.    Past Surgical History:  Procedure Laterality Date  . CERVICAL POLYPECTOMY     2010  . CHOLECYSTECTOMY    . COCHLEAR IMPLANT    . LEFT HEART CATHETERIZATION WITH CORONARY ANGIOGRAM N/A 03/25/2013   Procedure: LEFT HEART CATHETERIZATION WITH CORONARY ANGIOGRAM;  Surgeon: Burnell Blanks, MD;  Location: [facility] CATH LAB;  Service: Cardiovascular;  Laterality: N/A;  . ROBOTIC ASSISTED TOTAL HYSTERECTOMY WITH BILATERAL SALPINGO OOPHERECTOMY Bilateral 08/16/2014   Procedure: ROBOTIC ASSISTED TOTAL HYSTERECTOMY WITH BILATERAL SALPINGO OOPHORECTOMY ;  Surgeon: Everitt Amber, MD;  Location: [facility] ORS;  Service: Gynecology;  Laterality: Bilateral;  . UPPER GASTROINTESTINAL ENDOSCOPY    . uterine polyp     had D and C done to remove the polyp     Current Outpatient  Medications  Medication Sig Dispense Refill  . aspirin EC 81 MG tablet Take 81 mg by mouth daily.    . Calcium Carb-Cholecalciferol (CALCIUM + D3) 600-200 MG-UNIT TABS Take 1 tablet by mouth every morning.     . Cyanocobalamin (VITAMIN B 12 PO) Take 1,000 tablets by mouth daily.    . diclofenac sodium (VOLTAREN) 1 % GEL Apply 2 g topically 2 (two) times daily. Applies to knees    . furosemide (LASIX) 20 MG tablet TAKE 1 TABLET ONCE DAILY. 30 tablet 6  . levothyroxine (SYNTHROID, LEVOTHROID) 100 MCG tablet Take 100 mcg by mouth daily before breakfast.    . lisinopril (PRINIVIL,ZESTRIL) 20 MG tablet Take 20  mg by mouth daily.    . metoprolol succinate (TOPROL-XL) 25 MG 24 hr tablet TAKE 1 TABLET DAILY. 30 tablet 11  . metroNIDAZOLE (METROGEL) 0.75 % gel Apply 1 application topically daily. Patient places on her cheeks and nose, only about once or twice a week depending on the climate.    Marland Kitchen NITROSTAT 0.4 MG SL tablet DISSOLVE 1 TABLET UNDER TONGUE AS NEEDED FOR CHEST PAIN,MAY REPEAT IN5 MINUTES FOR 2 DOSES. 25 tablet 2  . pantoprazole (PROTONIX) 40 MG tablet Take one (1) tablet (40 mg) by mouth daily. 90 tablet 3  . potassium chloride SA (K-DUR,KLOR-CON) 20 MEQ tablet TAKE 1 TABLET ONCE DAILY. 30 tablet 6  . rosuvastatin (CRESTOR) 10 MG tablet Take 10 mg by mouth 3 (three) times a week.     No current facility-administered medications for this visit.     Allergies:   Bee venom; Contrast media [iodinated diagnostic agents]; and Iohexol    Social History:  The patient  reports that she quit smoking about 49 years ago. Her smoking use included cigarettes. She has a 0.40 pack-year smoking history. She has never used smokeless tobacco. She reports that she does not drink alcohol or use drugs.   Family History:  The patient's family history includes Cancer in her mother; Colon cancer in her cousin; Diabetes in her mother and sister; Heart attack in her maternal grandmother and mother; Heart disease in her mother; Heart failure in her mother; Hyperlipidemia in her sister; Hypertension in her mother; Other in her father.    ROS:  Please see the history of present illness.   Otherwise, review of systems are positive for knee pain.   All other systems are reviewed and negative.    PHYSICAL EXAM: VS:  BP (!) 168/96 (BP Location: Right Arm, Patient Position: Sitting, Cuff Size: Normal)   Pulse 68   Ht 5\' 6"  (1.676 m)   Wt 216 lb 12.8 oz (98.3 kg)   SpO2 98%   BMI 34.99 kg/m  , BMI Body mass index is 34.99 kg/m. GEN: Well nourished, well developed, in no acute distress  HEENT: normal  Neck: no JVD,  carotid bruits, or masses Cardiac: RRR; no murmurs, rubs, or gallops,no edema  Respiratory:  clear to auscultation bilaterally, normal work of breathing GI: soft, nontender, nondistended, + BS MS: no deformity or atrophy  Skin: warm and dry, no rash Neuro:  Strength and sensation are intact Psych: euthymic mood, full affect   Recent Labs: 05/16/2017: B Natriuretic Peptide 358.9; BUN 12; Creatinine, Ser 0.72; Hemoglobin 12.7; Platelets 180; Potassium 3.7; Sodium 137   Lipid Panel    Component Value Date/Time   CHOL 114 09/01/2013 1009   TRIG 78.0 09/01/2013 1009   HDL 41.80 09/01/2013 1009   CHOLHDL 3 09/01/2013 1009  VLDL 15.6 09/01/2013 1009   LDLCALC 57 09/01/2013 1009     Other studies Reviewed: Additional studies/ records that were reviewed today with results demonstrating: .   ASSESSMENT AND PLAN:  1. CAD/Old MI: No angina on intensified medical therapy.  Continue aggressive secondary prevention.  No CHF sx. 2. Hyperlipidemia: LDL 102. Tolerating Crestor 3x/week.  She thought her knee pain was related to statins but no loger feels this way.  Increase Crestor to 4x/week and recheck lipids in a few months.   Could consider PCSK9 inhibitor if statin not tolerated. 3. HTN: At home, BP in the 130s.  Lisinopril was recently increased.  Increase again to 40 mg daily.  Today's reading much higher than usual initially.  Blood pressure stayed elevated on repeat check by me. repeat labs in a week to check potassium and kidney function.  If blood pressure continues to be elevated, could consider amlodipine.  She has f/u with her PMD. 4. Known varicose veins- worse in the left leg.    Current medicines are reviewed at length with the patient today.  The patient concerns regarding her medicines were addressed.  The following changes have been made:  Increase lisinopril  Labs/ tests ordered today include:  No orders of the defined types were placed in this encounter.   Recommend 150  minutes/week of aerobic exercise Low fat, low carb, high fiber diet recommended  Disposition:   FU in 6 months   Signed, Larae Grooms, MD  07/01/2017 11:31 AM    [facility] [place], [place], [place]  [address] Phone: [phone]; Fax: [phone]

## 2017-07-01 ENCOUNTER — Encounter: Payer: Self-pay | Admitting: Interventional Cardiology

## 2017-07-01 ENCOUNTER — Ambulatory Visit (INDEPENDENT_AMBULATORY_CARE_PROVIDER_SITE_OTHER): Payer: Medicare Other | Admitting: Interventional Cardiology

## 2017-07-01 VITALS — BP 168/96 | HR 68 | Ht 66.0 in | Wt 216.8 lb

## 2017-07-01 DIAGNOSIS — I252 Old myocardial infarction: Secondary | ICD-10-CM

## 2017-07-01 DIAGNOSIS — I10 Essential (primary) hypertension: Secondary | ICD-10-CM

## 2017-07-01 DIAGNOSIS — E782 Mixed hyperlipidemia: Secondary | ICD-10-CM

## 2017-07-01 DIAGNOSIS — I25118 Atherosclerotic heart disease of native coronary artery with other forms of angina pectoris: Secondary | ICD-10-CM | POA: Diagnosis not present

## 2017-07-01 MED ORDER — LISINOPRIL 40 MG PO TABS: 40.0000 mg | ORAL_TABLET | Freq: Every day | ORAL | 3 refills | Status: DC

## 2017-07-01 MED ORDER — ROSUVASTATIN CALCIUM 10 MG PO TABS: 10.0000 mg | ORAL_TABLET | ORAL | 3 refills | Status: DC

## 2017-07-01 NOTE — Patient Instructions (Addendum)
Medication Instructions:  Your physician has recommended you make the following change in your medication:   1. INCREASE: rosuvastatin (crestor) 10 mg: Take 1 tablet (10 mg total) FOUR TIMES A WEEK  2. INCREASE: lisinopril to 40 mg daily  Labwork: Your physician recommends that you return for lab work in: 1 week for BMET  Your physician recommends that you return for a FASTING lipid profile and liver function panel in 3 months  Testing/Procedures: None ordered  Follow-Up: Your physician wants you to follow-up in: 6 months with Dr. Irish Lack. You will receive a reminder letter in the mail two months in advance. If you don't receive a letter, please call our office to schedule the follow-up appointment.   Any Other Special Instructions Will Be Listed Below (If Applicable).  Continue to monitor your Blood Pressure at home and keep your follow-up appointment with your Primary Care Provider   If you need a refill on your cardiac medications before your next appointment, please call your pharmacy.

## 2017-07-08 ENCOUNTER — Other Ambulatory Visit: Payer: Medicare Other | Admitting: *Deleted

## 2017-07-08 DIAGNOSIS — E782 Mixed hyperlipidemia: Secondary | ICD-10-CM | POA: Diagnosis not present

## 2017-07-08 DIAGNOSIS — I25118 Atherosclerotic heart disease of native coronary artery with other forms of angina pectoris: Secondary | ICD-10-CM | POA: Diagnosis not present

## 2017-07-08 DIAGNOSIS — I10 Essential (primary) hypertension: Secondary | ICD-10-CM | POA: Diagnosis not present

## 2017-07-08 DIAGNOSIS — I252 Old myocardial infarction: Secondary | ICD-10-CM

## 2017-07-08 LAB — BASIC METABOLIC PANEL
BUN/Creatinine Ratio: 21 (ref 12–28)
BUN: 16 mg/dL (ref 8–27)
CALCIUM: 9.4 mg/dL (ref 8.7–10.3)
CO2: 24 mmol/L (ref 20–29)
Chloride: 104 mmol/L (ref 96–106)
Creatinine, Ser: 0.76 mg/dL (ref 0.57–1.00)
GFR calc Af Amer: 89 mL/min/{1.73_m2} (ref >59)
GFR calc non Af Amer: 78 mL/min/{1.73_m2} (ref >59)
Glucose: 75 mg/dL (ref 65–99)
POTASSIUM: 4.3 mmol/L (ref 3.5–5.2)
Sodium: 140 mmol/L (ref 134–144)

## 2017-08-26 DIAGNOSIS — H903 Sensorineural hearing loss, bilateral: Secondary | ICD-10-CM | POA: Diagnosis not present

## 2017-08-27 DIAGNOSIS — J3089 Other allergic rhinitis: Secondary | ICD-10-CM | POA: Diagnosis not present

## 2017-08-27 DIAGNOSIS — I2510 Atherosclerotic heart disease of native coronary artery without angina pectoris: Secondary | ICD-10-CM | POA: Diagnosis not present

## 2017-08-27 DIAGNOSIS — I10 Essential (primary) hypertension: Secondary | ICD-10-CM | POA: Diagnosis not present

## 2017-08-27 DIAGNOSIS — D6489 Other specified anemias: Secondary | ICD-10-CM | POA: Diagnosis not present

## 2017-08-27 DIAGNOSIS — E7849 Other hyperlipidemia: Secondary | ICD-10-CM | POA: Diagnosis not present

## 2017-08-27 DIAGNOSIS — K219 Gastro-esophageal reflux disease without esophagitis: Secondary | ICD-10-CM | POA: Diagnosis not present

## 2017-08-27 DIAGNOSIS — Z6835 Body mass index (BMI) 35.0-35.9, adult: Secondary | ICD-10-CM | POA: Diagnosis not present

## 2017-08-27 DIAGNOSIS — E038 Other specified hypothyroidism: Secondary | ICD-10-CM | POA: Diagnosis not present

## 2017-08-27 DIAGNOSIS — I213 ST elevation (STEMI) myocardial infarction of unspecified site: Secondary | ICD-10-CM | POA: Diagnosis not present

## 2017-08-27 DIAGNOSIS — E668 Other obesity: Secondary | ICD-10-CM | POA: Diagnosis not present

## 2017-08-27 DIAGNOSIS — Z1389 Encounter for screening for other disorder: Secondary | ICD-10-CM | POA: Diagnosis not present

## 2017-09-30 ENCOUNTER — Other Ambulatory Visit: Payer: Medicare Other

## 2017-09-30 DIAGNOSIS — I252 Old myocardial infarction: Secondary | ICD-10-CM | POA: Diagnosis not present

## 2017-09-30 DIAGNOSIS — I25118 Atherosclerotic heart disease of native coronary artery with other forms of angina pectoris: Secondary | ICD-10-CM | POA: Diagnosis not present

## 2017-09-30 DIAGNOSIS — E782 Mixed hyperlipidemia: Secondary | ICD-10-CM

## 2017-09-30 DIAGNOSIS — I10 Essential (primary) hypertension: Secondary | ICD-10-CM | POA: Diagnosis not present

## 2017-09-30 LAB — LIPID PANEL
Chol/HDL Ratio: 3.2 ratio (ref 0.0–4.4)
Cholesterol, Total: 148 mg/dL (ref 100–199)
HDL: 46 mg/dL (ref >39)
LDL CALC: 78 mg/dL (ref 0–99)
TRIGLYCERIDES: 119 mg/dL (ref 0–149)
VLDL CHOLESTEROL CAL: 24 mg/dL (ref 5–40)

## 2017-09-30 LAB — HEPATIC FUNCTION PANEL
ALT: 13 IU/L (ref 0–32)
AST: 18 IU/L (ref 0–40)
Albumin: 4.2 g/dL (ref 3.5–4.8)
Alkaline Phosphatase: 92 IU/L (ref 39–117)
Bilirubin Total: 0.5 mg/dL (ref 0.0–1.2)
Bilirubin, Direct: 0.13 mg/dL (ref 0.00–0.40)
TOTAL PROTEIN: 6.7 g/dL (ref 6.0–8.5)

## 2017-12-10 NOTE — Progress Notes (Signed)
Cardiology Office Note   Date:  12/11/2017   ID:  [name], [name] 03-21-1940, MRN [id number]  PCP:  Burnard Bunting, MD    No chief complaint on file.  CAD  Wt Readings from Last 3 Encounters:  12/11/17 220 lb 9.6 oz (100.1 kg)  07/01/17 216 lb 12.8 oz (98.3 kg)  05/16/17 211 lb (95.7 kg)       History of Present Illness: [name] is a 77 y.o. female  with a hx of CAD status post inferoposterior STEMI 03/2013 treated with a BMS to the left circumflex, HL, HTN, IV dye allergy. Post PCI, she had recurrent nonsustained ventricular tachycardia. Relook cardiac catheterization demonstrated patent circumflex stent. Ejection fraction has been well-preserved. She has had persistent DOE since her MI without significant change. Echo in 06/2013 demonstrated normal LVF. PFTs were unremarkable in 2015. She has seen pulmonology as well.   Admitted to the hospital in 06/2014 with acute blood loss anemia from profound vaginal bleeding. Hemoglobin dropped to 7.9. She was transfused with 1 unit of PRBCs. Antiplatelet agents were stopped. Echo showed mildly decreased left ventricular function. She ultimately underwent a stress test due to decreased ejection fraction. No ischemia was found. Ultimately, she ahd a hystectomy in 2016.   More DOE since she has been exercising less. Has had occasional chest pains in the past.  Since the last visit, she has gained some weight .  She has a friend living with her who has cancer.  They are getting into a routine.  Denies : Chest pain. Dizziness. Leg edema. Nitroglycerin use. Orthopnea. Palpitations. Paroxysmal nocturnal dyspnea. Syncope.     Past Medical History:  Diagnosis Date  . Arthritis    needs left knee replacement  . CAD (coronary artery disease)    a. 03/2013 Infpost STEMI/PCI: LM nl, LAD min irregs, LCX 100 (3.0x12 Vision BMS), RCA  mild ostial dzs, EF 55%.  . Essential hypertension, benign   . Fibroids    a. uterine -  spotting noted since 03/11/2013.  Marland Kitchen GERD (gastroesophageal reflux disease)   . Hearing loss    a. s/p cochlear implant on right, hearing aid on left.  Marland Kitchen Hernia of anterior abdominal wall   . History of cardiovascular stress test    Lexiscan Myoview 8/16:  EF 58%, inferolateral scar, no ischemia; Low Risk  . Hyperlipidemia   . Hypothyroidism   . Ischemic cardiomyopathy    Echo 7/16: Septal and posterior lateral HK, EF 45-50%, mild LAE  . IV Contrast Allergy   . Obesity   . Rosacea   . Ventricular tachycardia ([place])    a. Immediate post pci/MI - no complications, on BB.    Past Surgical History:  Procedure Laterality Date  . CERVICAL POLYPECTOMY     2010  . CHOLECYSTECTOMY    . COCHLEAR IMPLANT    . LEFT HEART CATHETERIZATION WITH CORONARY ANGIOGRAM N/A 03/25/2013   Procedure: LEFT HEART CATHETERIZATION WITH CORONARY ANGIOGRAM;  Surgeon: Burnell Blanks, MD;  Location: [facility] CATH LAB;  Service: Cardiovascular;  Laterality: N/A;  . ROBOTIC ASSISTED TOTAL HYSTERECTOMY WITH BILATERAL SALPINGO OOPHERECTOMY Bilateral 08/16/2014   Procedure: ROBOTIC ASSISTED TOTAL HYSTERECTOMY WITH BILATERAL SALPINGO OOPHORECTOMY ;  Surgeon: Everitt Amber, MD;  Location: [facility] ORS;  Service: Gynecology;  Laterality: Bilateral;  . UPPER GASTROINTESTINAL ENDOSCOPY    . uterine polyp     had D and C done to remove the polyp     Current Outpatient Medications  Medication  Sig Dispense Refill  . aspirin EC 81 MG tablet Take 81 mg by mouth daily.    . Calcium Carb-Cholecalciferol (CALCIUM + D3) 600-200 MG-UNIT TABS Take 1 tablet by mouth every morning.     . Cyanocobalamin (VITAMIN B 12 PO) Take 1,000 tablets by mouth daily.    . diclofenac sodium (VOLTAREN) 1 % GEL Apply 2 g topically 2 (two) times daily. Applies to knees    . furosemide (LASIX) 20 MG tablet TAKE 1 TABLET ONCE DAILY. 30 tablet 6  . levothyroxine (SYNTHROID, LEVOTHROID) 100 MCG tablet Take 100 mcg by mouth daily before breakfast.    .  lisinopril (PRINIVIL,ZESTRIL) 40 MG tablet Take 1 tablet (40 mg total) by mouth daily. 90 tablet 3  . metoprolol succinate (TOPROL-XL) 25 MG 24 hr tablet TAKE 1 TABLET DAILY. 30 tablet 11  . metroNIDAZOLE (METROGEL) 0.75 % gel Apply 1 application topically daily. Patient places on her cheeks and nose, only about once or twice a week depending on the climate.    Marland Kitchen NITROSTAT 0.4 MG SL tablet DISSOLVE 1 TABLET UNDER TONGUE AS NEEDED FOR CHEST PAIN,MAY REPEAT IN5 MINUTES FOR 2 DOSES. 25 tablet 2  . pantoprazole (PROTONIX) 40 MG tablet Take one (1) tablet (40 mg) by mouth daily. 90 tablet 3  . potassium chloride SA (K-DUR,KLOR-CON) 20 MEQ tablet TAKE 1 TABLET ONCE DAILY. 30 tablet 6  . rosuvastatin (CRESTOR) 10 MG tablet Take 1 tablet (10 mg total) by mouth 4 (four) times a week. 48 tablet 3   No current facility-administered medications for this visit.     Allergies:   Bee venom; Contrast media [iodinated diagnostic agents]; and Iohexol    Social History:  The patient  reports that she quit smoking about 49 years ago. Her smoking use included cigarettes. She has a 0.40 pack-year smoking history. She has never used smokeless tobacco. She reports that she does not drink alcohol or use drugs.   Family History:  The patient's family history includes Cancer in her mother; Colon cancer in her cousin; Diabetes in her mother and sister; Heart attack in her maternal grandmother and mother; Heart disease in her mother; Heart failure in her mother; Hyperlipidemia in her sister; Hypertension in her mother; Other in her father.    ROS:  Please see the history of present illness.   Otherwise, review of systems are positive for occasional leg swelling.   All other systems are reviewed and negative.    PHYSICAL EXAM: VS:  BP (!) 146/76   Pulse 83   Ht 5\' 6"  (1.676 m)   Wt 220 lb 9.6 oz (100.1 kg)   SpO2 97%   BMI 35.61 kg/m  , BMI Body mass index is 35.61 kg/m. GEN: Well nourished, well developed, in no  acute distress  HEENT: normal  Neck: no JVD, carotid bruits, or masses Cardiac: RRR; no murmurs, rubs, or gallops,no edema  Respiratory:  clear to auscultation bilaterally, normal work of breathing GI: soft, nontender, nondistended, + BS MS: no deformity or atrophy  Skin: warm and dry, no rash Neuro:  Strength and sensation are intact Psych: euthymic mood, full affect     Recent Labs: 05/16/2017: B Natriuretic Peptide 358.9; Hemoglobin 12.7; Platelets 180 07/08/2017: BUN 16; Creatinine, Ser 0.76; Potassium 4.3; Sodium 140 09/30/2017: ALT 13   Lipid Panel    Component Value Date/Time   CHOL 148 09/30/2017 0832   TRIG 119 09/30/2017 0832   HDL 46 09/30/2017 0832   CHOLHDL 3.2 09/30/2017  7035   CHOLHDL 3 09/01/2013 1009   VLDL 15.6 09/01/2013 1009   LDLCALC 78 09/30/2017 0832     Other studies Reviewed: Additional studies/ records that were reviewed today with results demonstrating: cath records reviewed.   ASSESSMENT AND PLAN:  1. CAD / Old MI: No angina. COntinue aggressive secondary prevention.  Increase exercise to the target below.   2. Hypertensive heart disease: Home BP in the 009-381W range systolic.  3. Hyperlipidemia: LDL 78 in 7/19.  4. Leg edema: elevate legs.    Current medicines are reviewed at length with the patient today.  The patient concerns regarding her medicines were addressed.  The following changes have been made:  No change  Labs/ tests ordered today include:  No orders of the defined types were placed in this encounter.   Recommend 150 minutes/week of aerobic exercise Low fat, low carb, high fiber diet recommended  Disposition:   FU in 9 months   Signed, Larae Grooms, MD  12/11/2017 11:43 AM    [facility] [place], [place], [place]  [address] Phone: [phone]; Fax: [phone]

## 2017-12-11 ENCOUNTER — Ambulatory Visit (INDEPENDENT_AMBULATORY_CARE_PROVIDER_SITE_OTHER): Payer: Medicare Other | Admitting: Interventional Cardiology

## 2017-12-11 ENCOUNTER — Encounter: Payer: Self-pay | Admitting: Interventional Cardiology

## 2017-12-11 VITALS — BP 146/76 | HR 83 | Ht 66.0 in | Wt 220.6 lb

## 2017-12-11 DIAGNOSIS — I25118 Atherosclerotic heart disease of native coronary artery with other forms of angina pectoris: Secondary | ICD-10-CM | POA: Diagnosis not present

## 2017-12-11 DIAGNOSIS — E782 Mixed hyperlipidemia: Secondary | ICD-10-CM

## 2017-12-11 DIAGNOSIS — I119 Hypertensive heart disease without heart failure: Secondary | ICD-10-CM | POA: Diagnosis not present

## 2017-12-11 DIAGNOSIS — I252 Old myocardial infarction: Secondary | ICD-10-CM

## 2017-12-11 NOTE — Patient Instructions (Signed)
Medication Instructions:  Your physician recommends that you continue on your current medications as directed. Please refer to the Current Medication list given to you today.   Labwork: None ordered  Testing/Procedures: None ordered  Follow-Up: Your physician wants you to follow-up in: 9 months with Dr. Varanasi. You will receive a reminder letter in the mail two months in advance. If you don't receive a letter, please call our office to schedule the follow-up appointment.   Any Other Special Instructions Will Be Listed Below (If Applicable).     If you need a refill on your cardiac medications before your next appointment, please call your pharmacy.   

## 2017-12-14 ENCOUNTER — Other Ambulatory Visit: Payer: Self-pay | Admitting: Interventional Cardiology

## 2017-12-20 DIAGNOSIS — Z23 Encounter for immunization: Secondary | ICD-10-CM | POA: Diagnosis not present

## 2018-02-14 ENCOUNTER — Other Ambulatory Visit: Payer: Self-pay | Admitting: Interventional Cardiology

## 2018-03-17 DIAGNOSIS — I10 Essential (primary) hypertension: Secondary | ICD-10-CM | POA: Diagnosis not present

## 2018-03-17 DIAGNOSIS — R82998 Other abnormal findings in urine: Secondary | ICD-10-CM | POA: Diagnosis not present

## 2018-03-19 DIAGNOSIS — E039 Hypothyroidism, unspecified: Secondary | ICD-10-CM | POA: Diagnosis not present

## 2018-03-19 DIAGNOSIS — R82998 Other abnormal findings in urine: Secondary | ICD-10-CM | POA: Diagnosis not present

## 2018-03-19 DIAGNOSIS — Z0000 Encounter for general adult medical examination without abnormal findings: Secondary | ICD-10-CM | POA: Diagnosis not present

## 2018-03-19 DIAGNOSIS — I10 Essential (primary) hypertension: Secondary | ICD-10-CM | POA: Diagnosis not present

## 2018-03-25 DIAGNOSIS — K219 Gastro-esophageal reflux disease without esophagitis: Secondary | ICD-10-CM | POA: Diagnosis not present

## 2018-03-25 DIAGNOSIS — M25569 Pain in unspecified knee: Secondary | ICD-10-CM | POA: Diagnosis not present

## 2018-03-25 DIAGNOSIS — J309 Allergic rhinitis, unspecified: Secondary | ICD-10-CM | POA: Diagnosis not present

## 2018-03-25 DIAGNOSIS — Z1389 Encounter for screening for other disorder: Secondary | ICD-10-CM | POA: Diagnosis not present

## 2018-03-25 DIAGNOSIS — I10 Essential (primary) hypertension: Secondary | ICD-10-CM | POA: Diagnosis not present

## 2018-03-25 DIAGNOSIS — E7849 Other hyperlipidemia: Secondary | ICD-10-CM | POA: Diagnosis not present

## 2018-03-25 DIAGNOSIS — I252 Old myocardial infarction: Secondary | ICD-10-CM | POA: Diagnosis not present

## 2018-03-25 DIAGNOSIS — E669 Obesity, unspecified: Secondary | ICD-10-CM | POA: Diagnosis not present

## 2018-03-25 DIAGNOSIS — Z0000 Encounter for general adult medical examination without abnormal findings: Secondary | ICD-10-CM | POA: Diagnosis not present

## 2018-03-25 DIAGNOSIS — D649 Anemia, unspecified: Secondary | ICD-10-CM | POA: Diagnosis not present

## 2018-03-25 DIAGNOSIS — E038 Other specified hypothyroidism: Secondary | ICD-10-CM | POA: Diagnosis not present

## 2018-03-25 DIAGNOSIS — I2510 Atherosclerotic heart disease of native coronary artery without angina pectoris: Secondary | ICD-10-CM | POA: Diagnosis not present

## 2018-04-26 ENCOUNTER — Other Ambulatory Visit: Payer: Self-pay | Admitting: Interventional Cardiology

## 2018-06-11 ENCOUNTER — Other Ambulatory Visit: Payer: Self-pay | Admitting: Interventional Cardiology

## 2018-08-11 ENCOUNTER — Other Ambulatory Visit: Payer: Self-pay | Admitting: Interventional Cardiology

## 2018-09-21 DIAGNOSIS — E669 Obesity, unspecified: Secondary | ICD-10-CM | POA: Diagnosis not present

## 2018-09-21 DIAGNOSIS — E785 Hyperlipidemia, unspecified: Secondary | ICD-10-CM | POA: Diagnosis not present

## 2018-09-21 DIAGNOSIS — D649 Anemia, unspecified: Secondary | ICD-10-CM | POA: Diagnosis not present

## 2018-09-21 DIAGNOSIS — E039 Hypothyroidism, unspecified: Secondary | ICD-10-CM | POA: Diagnosis not present

## 2018-09-21 DIAGNOSIS — K219 Gastro-esophageal reflux disease without esophagitis: Secondary | ICD-10-CM | POA: Diagnosis not present

## 2018-09-21 DIAGNOSIS — I2510 Atherosclerotic heart disease of native coronary artery without angina pectoris: Secondary | ICD-10-CM | POA: Diagnosis not present

## 2018-09-21 DIAGNOSIS — I10 Essential (primary) hypertension: Secondary | ICD-10-CM | POA: Diagnosis not present

## 2018-09-21 DIAGNOSIS — M25569 Pain in unspecified knee: Secondary | ICD-10-CM | POA: Diagnosis not present

## 2018-09-21 DIAGNOSIS — I252 Old myocardial infarction: Secondary | ICD-10-CM | POA: Diagnosis not present

## 2018-09-21 DIAGNOSIS — J309 Allergic rhinitis, unspecified: Secondary | ICD-10-CM | POA: Diagnosis not present

## 2018-10-06 NOTE — Progress Notes (Signed)
Cardiology Office Note   Date:  10/07/2018   ID:  [name], [name] 1940-09-07, MRN [id number]  PCP:  Burnard Bunting, MD    No chief complaint on file.  CAD/Old MI  Wt Readings from Last 3 Encounters:  10/07/18 225 lb 12.8 oz (102.4 kg)  12/11/17 220 lb 9.6 oz (100.1 kg)  07/01/17 216 lb 12.8 oz (98.3 kg)       History of Present Illness: [name] is a 78 y.o. female  with a hx of CAD status post inferoposterior STEMI 03/2013 treated with a BMS to the left circumflex, HL, HTN, IV dye allergy. Post PCI, she had recurrent nonsustained ventricular tachycardia. Relook cardiac catheterization demonstrated patent circumflex stent. Ejection fraction has been well-preserved. She has had persistent DOE since her MI without significant change. Echo in 06/2013 demonstrated normal LVF. PFTs were unremarkable in 2015. She has seen pulmonology as well.   Admitted to the hospital in 06/2014 with acute blood loss anemia from profound vaginal bleeding. Hemoglobin dropped to 7.9. She was transfused with 1 unit of PRBCs. Antiplatelet agents were stopped. Echo showed mildly decreased left ventricular function. She ultimately underwent a stress test due to decreased ejection fraction. No ischemia was found. Ultimately, she ahd a hystectomy in 2016.  More DOE since she has been exercising less.Has had occasional chest pains in the past.  She has been isolated at home.  She has a lung cancer patient who is staying with her.   Denies : exertional Chest pain. Dizziness. Leg edema. Nitroglycerin use. Orthopnea. Palpitations. Paroxysmal nocturnal dyspnea. Shortness of breath. Syncope.   She is walking less and has gained weight since COVID 19.  No bleeding issues.   BP at home is in the 791-505W systolic, typically.   Past Medical History:  Diagnosis Date   Arthritis    needs left knee replacement   CAD (coronary artery disease)    a. 03/2013 Infpost STEMI/PCI: LM nl, LAD  min irregs, LCX 100 (3.0x12 Vision BMS), RCA  mild ostial dzs, EF 55%.   Essential hypertension, benign    Fibroids    a. uterine - spotting noted since 03/11/2013.   GERD (gastroesophageal reflux disease)    Hearing loss    a. s/p cochlear implant on right, hearing aid on left.   Hernia of anterior abdominal wall    History of cardiovascular stress test    Lexiscan Myoview 8/16:  EF 58%, inferolateral scar, no ischemia; Low Risk   Hyperlipidemia    Hypothyroidism    Ischemic cardiomyopathy    Echo 7/16: Septal and posterior lateral HK, EF 45-50%, mild LAE   IV Contrast Allergy    Obesity    Rosacea    Ventricular tachycardia ([place])    a. Immediate post pci/MI - no complications, on BB.    Past Surgical History:  Procedure Laterality Date   CERVICAL POLYPECTOMY     2010   CHOLECYSTECTOMY     COCHLEAR IMPLANT     LEFT HEART CATHETERIZATION WITH CORONARY ANGIOGRAM N/A 03/25/2013   Procedure: LEFT HEART CATHETERIZATION WITH CORONARY ANGIOGRAM;  Surgeon: Burnell Blanks, MD;  Location: [facility] CATH LAB;  Service: Cardiovascular;  Laterality: N/A;   ROBOTIC ASSISTED TOTAL HYSTERECTOMY WITH BILATERAL SALPINGO OOPHERECTOMY Bilateral 08/16/2014   Procedure: ROBOTIC ASSISTED TOTAL HYSTERECTOMY WITH BILATERAL SALPINGO OOPHORECTOMY ;  Surgeon: Everitt Amber, MD;  Location: [facility] ORS;  Service: Gynecology;  Laterality: Bilateral;   UPPER GASTROINTESTINAL ENDOSCOPY     uterine polyp  had D and C done to remove the polyp     Current Outpatient Medications  Medication Sig Dispense Refill   aspirin EC 81 MG tablet Take 81 mg by mouth daily.     Calcium Carb-Cholecalciferol (CALCIUM + D3) 600-200 MG-UNIT TABS Take 1 tablet by mouth every morning.      Cyanocobalamin (VITAMIN B 12 PO) Take 1,000 tablets by mouth daily.     diclofenac sodium (VOLTAREN) 1 % GEL Apply 2 g topically 2 (two) times daily. Applies to knees     furosemide (LASIX) 20 MG tablet TAKE 1 TABLET ONCE  DAILY. 30 tablet 4   levothyroxine (SYNTHROID, LEVOTHROID) 100 MCG tablet Take 100 mcg by mouth daily before breakfast.     lisinopril (PRINIVIL,ZESTRIL) 40 MG tablet TAKE 1 TABLET ONCE DAILY. 90 tablet 1   metoprolol succinate (TOPROL-XL) 25 MG 24 hr tablet TAKE 1 TABLET BY MOUTH DAILY. 30 tablet 11   metroNIDAZOLE (METROGEL) 0.75 % gel Apply 1 application topically daily. Patient places on her cheeks and nose, only about once or twice a week depending on the climate.     NITROSTAT 0.4 MG SL tablet DISSOLVE 1 TABLET UNDER TONGUE AS NEEDED FOR CHEST PAIN,MAY REPEAT IN5 MINUTES FOR 2 DOSES. 25 tablet 2   pantoprazole (PROTONIX) 40 MG tablet TAKE 1 TABLET ONCE DAILY. 90 tablet 3   potassium chloride SA (K-DUR,KLOR-CON) 20 MEQ tablet TAKE 1 TABLET ONCE DAILY. 30 tablet 4   rosuvastatin (CRESTOR) 10 MG tablet TAKE 1 TABLET 4 DAYS EACH WEEK. 60 tablet 0   No current facility-administered medications for this visit.     Allergies:   Bee venom, Contrast media [iodinated diagnostic agents], and Iohexol    Social History:  The patient  reports that she quit smoking about 50 years ago. Her smoking use included cigarettes. She has a 0.40 pack-year smoking history. She has never used smokeless tobacco. She reports that she does not drink alcohol or use drugs.   Family History:  The patient's family history includes Cancer in her mother; Colon cancer in her cousin; Diabetes in her mother and sister; Heart attack in her maternal grandmother and mother; Heart disease in her mother; Heart failure in her mother; Hyperlipidemia in her sister; Hypertension in her mother; Other in her father.    ROS:  Please see the history of present illness.   Otherwise, review of systems are positive for weight gain.   All other systems are reviewed and negative.    PHYSICAL EXAM: VS:  BP (!) 160/92    Pulse 77    Ht 5\' 6"  (1.676 m)    Wt 225 lb 12.8 oz (102.4 kg)    SpO2 97%    BMI 36.45 kg/m  , BMI Body mass index  is 36.45 kg/m. GEN: Well nourished, well developed, in no acute distress  HEENT: normal  Neck: no JVD, carotid bruits, or masses Cardiac: RRR; no murmurs, rubs, or gallops,no edema  Respiratory:  clear to auscultation bilaterally, normal work of breathing GI: soft, nontender, nondistended, + BS MS: no deformity or atrophy  Skin: warm and dry, no rash Neuro:  Strength and sensation are intact Psych: euthymic mood, full affect   EKG:   The ekg ordered today demonstrates NSR, no ST changes   Recent Labs: No results found for requested labs within last 8760 hours.   Lipid Panel    Component Value Date/Time   CHOL 148 09/30/2017 0832   TRIG 119 09/30/2017 0037  HDL 46 09/30/2017 0832   CHOLHDL 3.2 09/30/2017 0832   CHOLHDL 3 09/01/2013 1009   VLDL 15.6 09/01/2013 1009   LDLCALC 78 09/30/2017 0832     Other studies Reviewed: Additional studies/ records that were reviewed today with results demonstrating: labs reviewed.   ASSESSMENT AND PLAN:  1. CAD/Old MI: No angina. Continue aggressive secondary prevention.  2. Hypertensive heart disease: Readings at home are controlled.  Typically high in the MDs office.  3. Hyperlipidemia: LDL 55 in Jan 2020.  Continue rosuvastatin. 4. Leg edema: Some chronic left leg edema, but this has been managed wel with leg elevation.     Current medicines are reviewed at length with the patient today.  The patient concerns regarding her medicines were addressed.  The following changes have been made:  No change  Labs/ tests ordered today include:  No orders of the defined types were placed in this encounter.   Recommend 150 minutes/week of aerobic exercise Low fat, low carb, high fiber diet recommended  Disposition:   FU in 1 year   Signed, Larae Grooms, MD  10/07/2018 1:49 PM    [facility] [place], [place], [place]  [address] Phone: [phone]; Fax: [phone]

## 2018-10-07 ENCOUNTER — Encounter: Payer: Self-pay | Admitting: Interventional Cardiology

## 2018-10-07 ENCOUNTER — Telehealth: Payer: Self-pay | Admitting: Interventional Cardiology

## 2018-10-07 ENCOUNTER — Other Ambulatory Visit: Payer: Self-pay

## 2018-10-07 ENCOUNTER — Ambulatory Visit (INDEPENDENT_AMBULATORY_CARE_PROVIDER_SITE_OTHER): Payer: Medicare Other | Admitting: Interventional Cardiology

## 2018-10-07 VITALS — BP 160/92 | HR 77 | Ht 66.0 in | Wt 225.8 lb

## 2018-10-07 DIAGNOSIS — I252 Old myocardial infarction: Secondary | ICD-10-CM | POA: Diagnosis not present

## 2018-10-07 DIAGNOSIS — I119 Hypertensive heart disease without heart failure: Secondary | ICD-10-CM | POA: Diagnosis not present

## 2018-10-07 DIAGNOSIS — I25118 Atherosclerotic heart disease of native coronary artery with other forms of angina pectoris: Secondary | ICD-10-CM

## 2018-10-07 DIAGNOSIS — E782 Mixed hyperlipidemia: Secondary | ICD-10-CM | POA: Diagnosis not present

## 2018-10-07 NOTE — Telephone Encounter (Signed)

## 2018-10-07 NOTE — Patient Instructions (Signed)
Medication Instructions:  No changes If you need a refill on your cardiac medications before your next appointment, please call your pharmacy.   Lab work: none If you have labs (blood work) drawn today and your tests are completely normal, you will receive your results only by: Marland Kitchen MyChart Message (if you have MyChart) OR . A paper copy in the mail If you have any lab test that is abnormal or we need to change your treatment, we will call you to review the results.  Testing/Procedures: none  Follow-Up: At [facility], you and your health needs are our priority.  As part of our continuing mission to provide you with exceptional heart care, we have created designated Provider Care Teams.  These Care Teams include your primary Cardiologist (physician) and Advanced Practice Providers (APPs -  Physician Assistants and Nurse Practitioners) who all work together to provide you with the care you need, when you need it. You will need a follow up appointment in 12 months.  Please call our office 2 months in advance to schedule this appointment.  You may see Larae Grooms, MD or one of the following Advanced Practice Providers on your designated Care Team:   [place], PA-C Melina Copa, PA-C . Ermalinda Barrios, PA-C  Any Other Special Instructions Will Be Listed Below (If Applicable).

## 2018-11-10 ENCOUNTER — Other Ambulatory Visit: Payer: Self-pay | Admitting: Interventional Cardiology

## 2018-12-13 ENCOUNTER — Other Ambulatory Visit: Payer: Self-pay | Admitting: Interventional Cardiology

## 2018-12-16 ENCOUNTER — Other Ambulatory Visit: Payer: Self-pay

## 2018-12-16 MED ORDER — LISINOPRIL 40 MG PO TABS: 40.0000 mg | ORAL_TABLET | Freq: Every day | ORAL | 2 refills | Status: DC

## 2018-12-18 DIAGNOSIS — Z23 Encounter for immunization: Secondary | ICD-10-CM | POA: Diagnosis not present

## 2019-02-09 ENCOUNTER — Other Ambulatory Visit: Payer: Self-pay | Admitting: Interventional Cardiology

## 2019-04-25 ENCOUNTER — Ambulatory Visit: Payer: Self-pay

## 2019-05-10 ENCOUNTER — Other Ambulatory Visit: Payer: Self-pay | Admitting: Interventional Cardiology

## 2019-05-13 ENCOUNTER — Ambulatory Visit: Payer: Medicare Other

## 2019-07-21 DIAGNOSIS — E7849 Other hyperlipidemia: Secondary | ICD-10-CM | POA: Diagnosis not present

## 2019-07-21 DIAGNOSIS — E038 Other specified hypothyroidism: Secondary | ICD-10-CM | POA: Diagnosis not present

## 2019-07-26 DIAGNOSIS — J309 Allergic rhinitis, unspecified: Secondary | ICD-10-CM | POA: Diagnosis not present

## 2019-07-26 DIAGNOSIS — Z0000 Encounter for general adult medical examination without abnormal findings: Secondary | ICD-10-CM | POA: Diagnosis not present

## 2019-07-26 DIAGNOSIS — E669 Obesity, unspecified: Secondary | ICD-10-CM | POA: Diagnosis not present

## 2019-07-26 DIAGNOSIS — K219 Gastro-esophageal reflux disease without esophagitis: Secondary | ICD-10-CM | POA: Diagnosis not present

## 2019-07-26 DIAGNOSIS — E039 Hypothyroidism, unspecified: Secondary | ICD-10-CM | POA: Diagnosis not present

## 2019-07-26 DIAGNOSIS — M25569 Pain in unspecified knee: Secondary | ICD-10-CM | POA: Diagnosis not present

## 2019-07-26 DIAGNOSIS — R82998 Other abnormal findings in urine: Secondary | ICD-10-CM | POA: Diagnosis not present

## 2019-07-26 DIAGNOSIS — D649 Anemia, unspecified: Secondary | ICD-10-CM | POA: Diagnosis not present

## 2019-07-26 DIAGNOSIS — I10 Essential (primary) hypertension: Secondary | ICD-10-CM | POA: Diagnosis not present

## 2019-07-26 DIAGNOSIS — E785 Hyperlipidemia, unspecified: Secondary | ICD-10-CM | POA: Diagnosis not present

## 2019-07-26 DIAGNOSIS — I2510 Atherosclerotic heart disease of native coronary artery without angina pectoris: Secondary | ICD-10-CM | POA: Diagnosis not present

## 2019-07-26 DIAGNOSIS — Z1212 Encounter for screening for malignant neoplasm of rectum: Secondary | ICD-10-CM | POA: Diagnosis not present

## 2019-09-05 ENCOUNTER — Other Ambulatory Visit: Payer: Self-pay | Admitting: Interventional Cardiology

## 2019-10-13 NOTE — Progress Notes (Signed)
     Signed, Larae Grooms, MD  10/13/2019 11:49 PM    [facility], [place], [place]  [address] Phone: [phone]; Fax: [phone]

## 2019-10-14 ENCOUNTER — Other Ambulatory Visit: Payer: Self-pay

## 2019-10-14 ENCOUNTER — Ambulatory Visit (INDEPENDENT_AMBULATORY_CARE_PROVIDER_SITE_OTHER): Payer: Medicare Other | Admitting: Interventional Cardiology

## 2019-10-14 ENCOUNTER — Encounter: Payer: Self-pay | Admitting: Interventional Cardiology

## 2019-10-14 VITALS — BP 152/94 | HR 77 | Ht 66.0 in | Wt 222.8 lb

## 2019-10-14 DIAGNOSIS — I25118 Atherosclerotic heart disease of native coronary artery with other forms of angina pectoris: Secondary | ICD-10-CM

## 2019-10-14 DIAGNOSIS — I119 Hypertensive heart disease without heart failure: Secondary | ICD-10-CM | POA: Diagnosis not present

## 2019-10-14 DIAGNOSIS — I252 Old myocardial infarction: Secondary | ICD-10-CM

## 2019-10-14 DIAGNOSIS — E782 Mixed hyperlipidemia: Secondary | ICD-10-CM | POA: Diagnosis not present

## 2019-10-14 MED ORDER — METOPROLOL SUCCINATE ER 50 MG PO TB24: 50.0000 mg | ORAL_TABLET | Freq: Every day | ORAL | 3 refills | Status: DC

## 2019-10-14 NOTE — Progress Notes (Signed)
Cardiology Office Note   Date:  10/14/2019   ID:  [name], [name] January 14, 1941, MRN [id number]  PCP:  Burnard Bunting, MD    No chief complaint on file.  CAD  Wt Readings from Last 3 Encounters:  10/14/19 (!) 222 lb 12.8 oz (101.1 kg)  10/07/18 225 lb 12.8 oz (102.4 kg)  12/11/17 220 lb 9.6 oz (100.1 kg)       History of Present Illness: [name] is a 79 y.o. female   with a hx of CAD status post inferoposterior STEMI 03/2013 treated with a BMS to the left circumflex, HL, HTN, IV dye allergy. Post PCI, she had recurrent nonsustained ventricular tachycardia. Relook cardiac catheterization demonstrated patent circumflex stent. Ejection fraction has been well-preserved. She has had persistent DOE since her MI without significant change. Echo in 06/2013 demonstrated normal LVF. PFTs were unremarkable in 2015. She has seen pulmonology as well.   Admitted to the hospital in 06/2014 with acute blood loss anemia from profound vaginal bleeding. Hemoglobin dropped to 7.9. She was transfused with 1 unit of PRBCs. Antiplatelet agents were stopped. Echo showed mildly decreased left ventricular function. She ultimately underwent a stress test due to decreased ejection fraction. No ischemia was found. Ultimately, she ahd a hystectomy in 2016.   She had a lung cancer patient who is staying with her during [place] isolation.  THe patient is still with her.  Her nephew developed CHF.   Denies : Chest pain. Dizziness. Leg edema. Nitroglycerin use. Orthopnea. Palpitations. Paroxysmal nocturnal dyspnea. Shortness of breath. Syncope.   She received her vaccines, back in [facility] 2021.     Past Medical History:  Diagnosis Date  . Acute inferolateral myocardial infarction ([place]) 03/24/2013  . Acute MI, inferoposterior wall, initial episode of care ([place]) 03/28/2013  . Acute myocardial infarction of other inferior wall, initial episode of care   . Amnesia, global, transient   .  Arthritis    needs left knee replacement  . CAD (coronary artery disease)    a. 03/2013 Infpost STEMI/PCI: LM nl, LAD min irregs, LCX 100 (3.0x12 Vision BMS), RCA  mild ostial dzs, EF 55%.  . Essential hypertension, benign   . Fibroids    a. uterine - spotting noted since 03/11/2013.  Marland Kitchen GERD 11/19/2007   Qualifier: Diagnosis of  By: Henrene Pastor MD, Docia Chuck   . GERD (gastroesophageal reflux disease)   . Hearing loss    a. s/p cochlear implant on right, hearing aid on left.  Marland Kitchen Hernia of anterior abdominal wall   . History of cardiovascular stress test    Lexiscan Myoview 8/16:  EF 58%, inferolateral scar, no ischemia; Low Risk  . Hyperlipidemia   . Hypothyroidism   . Ischemic cardiomyopathy    Echo 7/16: Septal and posterior lateral HK, EF 45-50%, mild LAE  . IV Contrast Allergy   . Near syncope 06/26/2014  . Obesity   . Old MI (myocardial infarction) 03/03/2014  . Retrograde amnesia 06/26/2014  . Rosacea   . Ventricular tachycardia ([place])    a. Immediate post pci/MI - no complications, on BB.    Past Surgical History:  Procedure Laterality Date  . CERVICAL POLYPECTOMY     2010  . CHOLECYSTECTOMY    . COCHLEAR IMPLANT    . LEFT HEART CATHETERIZATION WITH CORONARY ANGIOGRAM N/A 03/25/2013   Procedure: LEFT HEART CATHETERIZATION WITH CORONARY ANGIOGRAM;  Surgeon: Burnell Blanks, MD;  Location: [facility] CATH LAB;  Service: Cardiovascular;  Laterality: N/A;  .  ROBOTIC ASSISTED TOTAL HYSTERECTOMY WITH BILATERAL SALPINGO OOPHERECTOMY Bilateral 08/16/2014   Procedure: ROBOTIC ASSISTED TOTAL HYSTERECTOMY WITH BILATERAL SALPINGO OOPHORECTOMY ;  Surgeon: Everitt Amber, MD;  Location: [facility] ORS;  Service: Gynecology;  Laterality: Bilateral;  . UPPER GASTROINTESTINAL ENDOSCOPY    . uterine polyp     had D and C done to remove the polyp     Current Outpatient Medications  Medication Sig Dispense Refill  . aspirin EC 81 MG tablet Take 81 mg by mouth daily.    . Calcium Carb-Cholecalciferol (CALCIUM +  D3) 600-200 MG-UNIT TABS Take 1 tablet by mouth every morning.     . Cyanocobalamin (VITAMIN B 12 PO) Take 1,000 tablets by mouth daily.    . diclofenac sodium (VOLTAREN) 1 % GEL Apply 2 g topically 2 (two) times daily. Applies to knees    . furosemide (LASIX) 20 MG tablet Take 1 tablet (20 mg total) by mouth daily. 90 tablet 1  . levothyroxine (SYNTHROID, LEVOTHROID) 100 MCG tablet Take 100 mcg by mouth daily before breakfast.    . lisinopril (ZESTRIL) 40 MG tablet Take 1 tablet (40 mg total) by mouth daily. Please keep upcoming appt in July with Dr. Irish Lack before anymore refills. Thank  You 90 tablet 0  . metoprolol succinate (TOPROL-XL) 25 MG 24 hr tablet TAKE 1 TABLET BY MOUTH DAILY. 90 tablet 3  . metroNIDAZOLE (METROGEL) 0.75 % gel Apply 1 application topically daily. Patient places on her cheeks and nose, only about once or twice a week depending on the climate.    Marland Kitchen NITROSTAT 0.4 MG SL tablet DISSOLVE 1 TABLET UNDER TONGUE AS NEEDED FOR CHEST PAIN,MAY REPEAT IN5 MINUTES FOR 2 DOSES. 25 tablet 3  . pantoprazole (PROTONIX) 40 MG tablet TAKE 1 TABLET ONCE DAILY. 90 tablet 2  . potassium chloride SA (KLOR-CON) 20 MEQ tablet Take 1 tablet (20 mEq total) by mouth daily. 90 tablet 1  . rosuvastatin (CRESTOR) 10 MG tablet TAKE 1 TABLET 4 DAYS EACH WEEK. 48 tablet 1   No current facility-administered medications for this visit.    Allergies:   Bee venom, Contrast media [iodinated diagnostic agents], and Iohexol    Social History:  The patient  reports that she quit smoking about 51 years ago. Her smoking use included cigarettes. She has a 0.40 pack-year smoking history. She has never used smokeless tobacco. She reports that she does not drink alcohol and does not use drugs.   Family History:  The patient's *family history includes Cancer in her mother; Colon cancer in her cousin; Diabetes in her mother and sister; Heart attack in her maternal grandmother and mother; Heart disease in her mother;  Heart failure in her mother; Hyperlipidemia in her sister; Hypertension in her mother; Other in her father.    ROS:  Please see the history of present illness.   Otherwise, review of systems are positive for less exercise.   All other systems are reviewed and negative.    PHYSICAL EXAM: VS:  BP (!) 152/94   Pulse 77   Ht 5\' 6"  (1.676 m)   Wt (!) 222 lb 12.8 oz (101.1 kg)   SpO2 96%   BMI 35.96 kg/m  , BMI Body mass index is 35.96 kg/m. GEN: Well nourished, well developed, in no acute distress  HEENT: normal  Neck: no JVD, carotid bruits, or masses Cardiac: RRR; no murmurs, rubs, or gallops,no edema  Respiratory:  clear to auscultation bilaterally, normal work of breathing GI: soft, nontender, nondistended, +  BS MS: no deformity or atrophy  Skin: warm and dry, no rash Neuro:  Strength and sensation are intact Psych: euthymic mood, full affect   EKG:   The ekg ordered today demonstrates NSR, PVC, nonspecific ST changes   Recent Labs: No results found for requested labs within last 8760 hours.   Lipid Panel    Component Value Date/Time   CHOL 148 09/30/2017 0832   TRIG 119 09/30/2017 0832   HDL 46 09/30/2017 0832   CHOLHDL 3.2 09/30/2017 0832   CHOLHDL 3 09/01/2013 1009   VLDL 15.6 09/01/2013 1009   LDLCALC 78 09/30/2017 0832     Other studies Reviewed: Additional studies/ records that were reviewed today with results demonstrating: LDL 70 in 07/21/19.   ASSESSMENT AND PLAN:  1. CAD/Old MI: No angina. Continue aggressive secondary prevention.  2. Hypertensive heart disease: She attributes the high readings to stress, but she has been inactive.  Limits salt.  Increase Toprol to 50 mg daily to see if this helps with BP. Continue to check BP at home.  If BP stays > 140/90, then would add amlodipine 5 mg daily. 3. Hyperlipidemia: The current medical regimen is effective;  continue present plan and medications. 4. Leg edema:  Continue Lasix.  Elevate legs.    Current  medicines are reviewed at length with the patient today.  The patient concerns regarding her medicines were addressed.  The following changes have been made:  No change  Labs/ tests ordered today include:  No orders of the defined types were placed in this encounter.   Recommend 150 minutes/week of aerobic exercise- increase to this goal for BP control Low fat, low carb, high fiber diet recommended  Disposition:   FU in 3 months for BP check   Signed, Larae Grooms, MD  10/14/2019 3:35 PM    [facility] [address], [place], [place]  [address] Phone: [phone]; Fax: [phone]

## 2019-10-14 NOTE — Patient Instructions (Addendum)
Medication Instructions:  Increase the metoprolol succinate (Toprol XL) to 50 mg take one tablet by mouth daily.  *If you need a refill on your cardiac medications before your next appointment, please call your pharmacy*   Lab Work: None If you have labs (blood work) drawn today and your tests are completely normal, you will receive your results only by: Marland Kitchen MyChart Message (if you have MyChart) OR . A paper copy in the mail If you have any lab test that is abnormal or we need to change your treatment, we will call you to review the results.   Testing/Procedures: None   Follow-Up:  You are scheduled to see Dr Irish Lack for a 3 month follow up appointment on 01/14/2020 at 3:20 PM.  At [facility], you and your health needs are our priority.  As part of our continuing mission to provide you with exceptional heart care, we have created designated Provider Care Teams.  These Care Teams include your primary Cardiologist (physician) and Advanced Practice Providers (APPs -  Physician Assistants and Nurse Practitioners) who all work together to provide you with the care you need, when you need it.     Other Instructions  Your physician has requested that you regularly monitor and record your blood pressure readings at home. Please use the same machine at the same time of day to check your readings.  Your provider recommends that you maintain 150 minutes per week of moderate aerobic activity.

## 2019-11-04 ENCOUNTER — Other Ambulatory Visit: Payer: Self-pay | Admitting: Interventional Cardiology

## 2019-12-12 ENCOUNTER — Other Ambulatory Visit: Payer: Self-pay | Admitting: Interventional Cardiology

## 2019-12-21 ENCOUNTER — Emergency Department (HOSPITAL_COMMUNITY)
Admission: EM | Admit: 2019-12-21 | Discharge: 2019-12-22 | Disposition: A | Discharge status: Home / Self Care | Payer: Medicare Other | Attending: Emergency Medicine | Admitting: Emergency Medicine

## 2019-12-21 DIAGNOSIS — Z7901 Long term (current) use of anticoagulants: Secondary | ICD-10-CM | POA: Insufficient documentation

## 2019-12-21 DIAGNOSIS — Z79899 Other long term (current) drug therapy: Secondary | ICD-10-CM | POA: Diagnosis not present

## 2019-12-21 DIAGNOSIS — I4891 Unspecified atrial fibrillation: Secondary | ICD-10-CM | POA: Diagnosis not present

## 2019-12-21 DIAGNOSIS — I2510 Atherosclerotic heart disease of native coronary artery without angina pectoris: Secondary | ICD-10-CM | POA: Insufficient documentation

## 2019-12-21 DIAGNOSIS — Z87891 Personal history of nicotine dependence: Secondary | ICD-10-CM | POA: Insufficient documentation

## 2019-12-21 DIAGNOSIS — Z955 Presence of coronary angioplasty implant and graft: Secondary | ICD-10-CM | POA: Insufficient documentation

## 2019-12-21 DIAGNOSIS — E039 Hypothyroidism, unspecified: Secondary | ICD-10-CM | POA: Insufficient documentation

## 2019-12-21 DIAGNOSIS — Z96652 Presence of left artificial knee joint: Secondary | ICD-10-CM | POA: Insufficient documentation

## 2019-12-21 DIAGNOSIS — I4892 Unspecified atrial flutter: Secondary | ICD-10-CM

## 2019-12-21 DIAGNOSIS — R002 Palpitations: Secondary | ICD-10-CM | POA: Diagnosis present

## 2019-12-21 DIAGNOSIS — I10 Essential (primary) hypertension: Secondary | ICD-10-CM | POA: Diagnosis not present

## 2019-12-21 DIAGNOSIS — R4182 Altered mental status, unspecified: Secondary | ICD-10-CM | POA: Diagnosis not present

## 2019-12-22 ENCOUNTER — Observation Stay (HOSPITAL_COMMUNITY): Payer: Medicare Other

## 2019-12-22 ENCOUNTER — Observation Stay (HOSPITAL_BASED_OUTPATIENT_CLINIC_OR_DEPARTMENT_OTHER): Payer: Medicare Other

## 2019-12-22 ENCOUNTER — Observation Stay (HOSPITAL_COMMUNITY)
Admission: EM | Admit: 2019-12-22 | Discharge: 2019-12-23 | Disposition: A | Discharge status: Home / Self Care | Payer: Medicare Other | Attending: Internal Medicine | Admitting: Internal Medicine

## 2019-12-22 ENCOUNTER — Emergency Department (HOSPITAL_COMMUNITY): Payer: Medicare Other

## 2019-12-22 ENCOUNTER — Other Ambulatory Visit: Payer: Self-pay | Admitting: Student

## 2019-12-22 ENCOUNTER — Other Ambulatory Visit: Payer: Self-pay

## 2019-12-22 ENCOUNTER — Encounter (HOSPITAL_COMMUNITY): Payer: Self-pay | Admitting: Emergency Medicine

## 2019-12-22 DIAGNOSIS — I110 Hypertensive heart disease with heart failure: Secondary | ICD-10-CM | POA: Insufficient documentation

## 2019-12-22 DIAGNOSIS — I160 Hypertensive urgency: Secondary | ICD-10-CM

## 2019-12-22 DIAGNOSIS — G459 Transient cerebral ischemic attack, unspecified: Principal | ICD-10-CM | POA: Insufficient documentation

## 2019-12-22 DIAGNOSIS — F05 Delirium due to known physiological condition: Secondary | ICD-10-CM | POA: Diagnosis not present

## 2019-12-22 DIAGNOSIS — I2583 Coronary atherosclerosis due to lipid rich plaque: Secondary | ICD-10-CM

## 2019-12-22 DIAGNOSIS — I2510 Atherosclerotic heart disease of native coronary artery without angina pectoris: Secondary | ICD-10-CM | POA: Diagnosis not present

## 2019-12-22 DIAGNOSIS — R410 Disorientation, unspecified: Secondary | ICD-10-CM | POA: Diagnosis not present

## 2019-12-22 DIAGNOSIS — R262 Difficulty in walking, not elsewhere classified: Secondary | ICD-10-CM | POA: Diagnosis not present

## 2019-12-22 DIAGNOSIS — I517 Cardiomegaly: Secondary | ICD-10-CM | POA: Diagnosis not present

## 2019-12-22 DIAGNOSIS — E039 Hypothyroidism, unspecified: Secondary | ICD-10-CM | POA: Diagnosis not present

## 2019-12-22 DIAGNOSIS — Z23 Encounter for immunization: Secondary | ICD-10-CM | POA: Diagnosis not present

## 2019-12-22 DIAGNOSIS — I4892 Unspecified atrial flutter: Secondary | ICD-10-CM | POA: Insufficient documentation

## 2019-12-22 DIAGNOSIS — E785 Hyperlipidemia, unspecified: Secondary | ICD-10-CM | POA: Diagnosis not present

## 2019-12-22 DIAGNOSIS — G454 Transient global amnesia: Secondary | ICD-10-CM | POA: Diagnosis not present

## 2019-12-22 DIAGNOSIS — I6389 Other cerebral infarction: Secondary | ICD-10-CM

## 2019-12-22 DIAGNOSIS — R4182 Altered mental status, unspecified: Secondary | ICD-10-CM | POA: Diagnosis not present

## 2019-12-22 DIAGNOSIS — I5022 Chronic systolic (congestive) heart failure: Secondary | ICD-10-CM | POA: Diagnosis not present

## 2019-12-22 DIAGNOSIS — Z20822 Contact with and (suspected) exposure to covid-19: Secondary | ICD-10-CM | POA: Diagnosis not present

## 2019-12-22 DIAGNOSIS — I483 Typical atrial flutter: Secondary | ICD-10-CM

## 2019-12-22 DIAGNOSIS — I252 Old myocardial infarction: Secondary | ICD-10-CM | POA: Insufficient documentation

## 2019-12-22 DIAGNOSIS — Z79899 Other long term (current) drug therapy: Secondary | ICD-10-CM | POA: Insufficient documentation

## 2019-12-22 DIAGNOSIS — R002 Palpitations: Secondary | ICD-10-CM | POA: Diagnosis not present

## 2019-12-22 DIAGNOSIS — Z7902 Long term (current) use of antithrombotics/antiplatelets: Secondary | ICD-10-CM | POA: Insufficient documentation

## 2019-12-22 DIAGNOSIS — I10 Essential (primary) hypertension: Secondary | ICD-10-CM | POA: Diagnosis not present

## 2019-12-22 LAB — RAPID URINE DRUG SCREEN, HOSP PERFORMED
Amphetamines: NOT DETECTED
Barbiturates: NOT DETECTED
Benzodiazepines: NOT DETECTED
Cocaine: NOT DETECTED
Opiates: NOT DETECTED
Tetrahydrocannabinol: NOT DETECTED

## 2019-12-22 LAB — DIFFERENTIAL

## 2019-12-22 LAB — TROPONIN I (HIGH SENSITIVITY)
Troponin I (High Sensitivity): 9 ng/L (ref <18)
Troponin I (High Sensitivity): 24 ng/L — ABNORMAL HIGH (ref <18)
Troponin I (High Sensitivity): 39 ng/L — ABNORMAL HIGH (ref <18)
Troponin I (High Sensitivity): 38 ng/L — ABNORMAL HIGH (ref <18)

## 2019-12-22 LAB — PROTIME-INR
INR: 1.1 (ref 0.8–1.2)
Prothrombin Time: 13.7 seconds (ref 11.4–15.2)

## 2019-12-22 LAB — CBC WITH DIFFERENTIAL/PLATELET
Abs Immature Granulocytes: 0.03 10*3/uL (ref 0.00–0.07)
Basophils Absolute: 0 10*3/uL (ref 0.0–0.1)
Basophils Relative: 1 %
Eosinophils Absolute: 0.1 10*3/uL (ref 0.0–0.5)
Eosinophils Relative: 1 %
HCT: 43.2 % (ref 36.0–46.0)
Hemoglobin: 14.2 g/dL (ref 12.0–15.0)
Immature Granulocytes: 0 %
Lymphocytes Relative: 16 %
Lymphs Abs: 1.2 10*3/uL (ref 0.7–4.0)
MCH: 30 pg (ref 26.0–34.0)
MCHC: 32.9 g/dL (ref 30.0–36.0)
MCV: 91.1 fL (ref 80.0–100.0)
Monocytes Absolute: 0.5 10*3/uL (ref 0.1–1.0)
Monocytes Relative: 7 %
Neutro Abs: 5.7 10*3/uL (ref 1.7–7.7)
Neutrophils Relative %: 75 %
Platelets: 165 10*3/uL (ref 150–400)
RBC: 4.74 MIL/uL (ref 3.87–5.11)
RDW: 12.7 % (ref 11.5–15.5)
WBC: 7.5 10*3/uL (ref 4.0–10.5)
nRBC: 0 % (ref 0.0–0.2)

## 2019-12-22 LAB — APTT: aPTT: 28 seconds (ref 24–36)

## 2019-12-22 LAB — URINALYSIS, ROUTINE W REFLEX MICROSCOPIC
Bilirubin Urine: NEGATIVE
Glucose, UA: NEGATIVE mg/dL
Hgb urine dipstick: NEGATIVE
Ketones, ur: NEGATIVE mg/dL
Leukocytes,Ua: NEGATIVE
Nitrite: NEGATIVE
Protein, ur: NEGATIVE mg/dL
Specific Gravity, Urine: 1.006 (ref 1.005–1.030)
pH: 7 (ref 5.0–8.0)

## 2019-12-22 LAB — CBC
HCT: 43.8 % (ref 36.0–46.0)
Hemoglobin: 14.4 g/dL (ref 12.0–15.0)
MCH: 29.8 pg (ref 26.0–34.0)
MCHC: 32.9 g/dL (ref 30.0–36.0)
MCV: 90.5 fL (ref 80.0–100.0)
Platelets: 203 10*3/uL (ref 150–400)
RBC: 4.84 MIL/uL (ref 3.87–5.11)
RDW: 12.6 % (ref 11.5–15.5)
WBC: 7.6 10*3/uL (ref 4.0–10.5)
nRBC: 0 % (ref 0.0–0.2)

## 2019-12-22 LAB — BASIC METABOLIC PANEL
Anion gap: 10 (ref 5–15)
BUN: 15 mg/dL (ref 8–23)
CO2: 24 mmol/L (ref 22–32)
Calcium: 9.6 mg/dL (ref 8.9–10.3)
Chloride: 103 mmol/L (ref 98–111)
Creatinine, Ser: 0.85 mg/dL (ref 0.44–1.00)
GFR calc non Af Amer: >60 mL/min (ref >60)
Glucose, Bld: 181 mg/dL — ABNORMAL HIGH (ref 70–99)
Potassium: 3.6 mmol/L (ref 3.5–5.1)
Sodium: 137 mmol/L (ref 135–145)

## 2019-12-22 LAB — I-STAT CHEM 8, ED
BUN: 10 mg/dL (ref 8–23)
Calcium, Ion: 1.26 mmol/L (ref 1.15–1.40)
Chloride: 105 mmol/L (ref 98–111)
Creatinine, Ser: 0.5 mg/dL (ref 0.44–1.00)
Glucose, Bld: 150 mg/dL — ABNORMAL HIGH (ref 70–99)
HCT: 43 % (ref 36.0–46.0)
Hemoglobin: 14.6 g/dL (ref 12.0–15.0)
Potassium: 3.8 mmol/L (ref 3.5–5.1)
Sodium: 141 mmol/L (ref 135–145)
TCO2: 25 mmol/L (ref 22–32)

## 2019-12-22 LAB — ETHANOL: Alcohol, Ethyl (B): <10 mg/dL (ref <10)

## 2019-12-22 LAB — TSH: TSH: 0.391 u[IU]/mL (ref 0.350–4.500)

## 2019-12-22 LAB — RESPIRATORY PANEL BY RT PCR (FLU A&B, COVID)
Influenza A by PCR: NEGATIVE
Influenza B by PCR: NEGATIVE
SARS Coronavirus 2 by RT PCR: NEGATIVE

## 2019-12-22 LAB — ECHOCARDIOGRAM COMPLETE
Area-P 1/2: 2.5 cm2
S' Lateral: 3 cm

## 2019-12-22 MED ORDER — LEVOTHYROXINE SODIUM 100 MCG PO TABS
100.0000 ug | ORAL_TABLET | Freq: Every day | ORAL | Status: DC
  Administered 2019-12-23: 100 ug via ORAL
  Filled 2019-12-22: qty 1

## 2019-12-22 MED ORDER — LABETALOL HCL 5 MG/ML IV SOLN: 10.0000 mg | Freq: Once | INTRAVENOUS | Status: AC

## 2019-12-22 MED ORDER — DICLOFENAC SODIUM 1 % TD GEL
2.0000 g | Freq: Two times a day (BID) | TRANSDERMAL | Status: DC
  Administered 2019-12-22 – 2019-12-23 (×2): 2 g via TOPICAL
  Filled 2019-12-22: qty 100

## 2019-12-22 MED ORDER — LABETALOL HCL 5 MG/ML IV SOLN
INTRAVENOUS | Status: AC
  Administered 2019-12-22: 10 mg via INTRAVENOUS
  Filled 2019-12-22: qty 4

## 2019-12-22 MED ORDER — DIPHENHYDRAMINE HCL 25 MG PO CAPS: 50.0000 mg | ORAL_CAPSULE | Freq: Once | ORAL | Status: DC

## 2019-12-22 MED ORDER — ACETAMINOPHEN 160 MG/5ML PO SOLN: 650.0000 mg | ORAL | Status: DC | PRN

## 2019-12-22 MED ORDER — DIPHENHYDRAMINE HCL 50 MG/ML IJ SOLN: 50.0000 mg | Freq: Once | INTRAMUSCULAR | Status: DC

## 2019-12-22 MED ORDER — RIVAROXABAN 20 MG PO TABS: 20.0000 mg | ORAL_TABLET | Freq: Every day | ORAL | 11 refills | Status: DC

## 2019-12-22 MED ORDER — ACETAMINOPHEN 325 MG PO TABS
650.0000 mg | ORAL_TABLET | ORAL | Status: DC | PRN
  Administered 2019-12-22: 650 mg via ORAL
  Filled 2019-12-22: qty 2

## 2019-12-22 MED ORDER — PREDNISONE 50 MG PO TABS
50.0000 mg | ORAL_TABLET | Freq: Four times a day (QID) | ORAL | Status: DC
  Administered 2019-12-22 (×2): 50 mg via ORAL
  Filled 2019-12-22 (×2): qty 3

## 2019-12-22 MED ORDER — PANTOPRAZOLE SODIUM 40 MG PO TBEC
40.0000 mg | DELAYED_RELEASE_TABLET | Freq: Every day | ORAL | Status: DC
  Administered 2019-12-22 – 2019-12-23 (×2): 40 mg via ORAL
  Filled 2019-12-22 (×2): qty 1

## 2019-12-22 MED ORDER — ACETAMINOPHEN 650 MG RE SUPP: 650.0000 mg | RECTAL | Status: DC | PRN

## 2019-12-22 MED ORDER — STROKE: EARLY STAGES OF RECOVERY BOOK: Freq: Once | Status: DC

## 2019-12-22 MED ORDER — METOPROLOL SUCCINATE ER 50 MG PO TB24
50.0000 mg | ORAL_TABLET | Freq: Every day | ORAL | Status: DC
  Administered 2019-12-22 – 2019-12-23 (×2): 50 mg via ORAL
  Filled 2019-12-22: qty 2
  Filled 2019-12-22: qty 1

## 2019-12-22 MED ORDER — ASPIRIN 81 MG PO CHEW
81.0000 mg | CHEWABLE_TABLET | Freq: Every day | ORAL | Status: DC
  Administered 2019-12-22 – 2019-12-23 (×2): 81 mg via ORAL
  Filled 2019-12-22 (×2): qty 1

## 2019-12-22 MED ORDER — LEVOTHYROXINE SODIUM 100 MCG PO TABS: 100.0000 ug | ORAL_TABLET | Freq: Every day | ORAL | Status: DC

## 2019-12-22 MED ORDER — ROSUVASTATIN CALCIUM 5 MG PO TABS
10.0000 mg | ORAL_TABLET | Freq: Every day | ORAL | Status: DC
  Administered 2019-12-22 – 2019-12-23 (×2): 10 mg via ORAL
  Filled 2019-12-22 (×2): qty 2

## 2019-12-22 MED ORDER — DIPHENHYDRAMINE HCL 50 MG/ML IJ SOLN
INTRAMUSCULAR | Status: AC
  Administered 2019-12-22: 50 mg
  Filled 2019-12-22: qty 1

## 2019-12-22 NOTE — ED Provider Notes (Signed)
[facility] EMERGENCY DEPARTMENT Provider Note   CSN: [id number] Arrival date & time: 12/21/19  2354     History Chief Complaint  Patient presents with  . Palpitations    [name] is a 79 y.o. female with a history of hearing loss status post cochlear implant on the right, hearing aid on the left, CAD status post inferoposterior STEMI in January 2015 treated with a BMS to the left circumflex, HL, HTN, IV dye allergy who presents the emergency department by POV by her sister with a chief complaint of palpitations.  The patient reports  feeling as if her heart was racing and feeling "fluttering" and "banging" in her throat.  States that the symptoms woke her from sleep.  She took her blood pressure at home, which was 170/120 and her heart rate was noted to be in the 120s.  She waited several minutes then rechecked her heart rate and it continued to be in the 120s so she came to the ER for further evaluation.  She reports that the palpitations have since resolved.  She denies chest pain, shortness of breath, numbness, weakness, headache, visual changes, slurred speech, facial droop, leg swelling, orthopnea, diaphoresis, nausea, vomiting, diarrhea, abdominal pain.  No recent illnesses.  She has been compliant with her home medications.  Reports that she has a low suspicion for COVID-19 as she is fully vaccinated and she is caring for a friend with advanced stage lung cancer who is now living in her home.  She has been compliant with her home medications.  She is followed by Dr. Irish Lack and has a follow-up appointment in the office on 10/29.   The history is provided by the patient and medical records. No language interpreter was used.       Past Medical History:  Diagnosis Date  . Acute inferolateral myocardial infarction ([place]) 03/24/2013  . Acute MI, inferoposterior wall, initial episode of care ([place]) 03/28/2013  . Acute myocardial infarction of other inferior  wall, initial episode of care   . Amnesia, global, transient   . Arthritis    needs left knee replacement  . CAD (coronary artery disease)    a. 03/2013 Infpost STEMI/PCI: LM nl, LAD min irregs, LCX 100 (3.0x12 Vision BMS), RCA  mild ostial dzs, EF 55%.  . Essential hypertension, benign   . Fibroids    a. uterine - spotting noted since 03/11/2013.  Marland Kitchen GERD 11/19/2007   Qualifier: Diagnosis of  By: Henrene Pastor MD, Docia Chuck   . GERD (gastroesophageal reflux disease)   . Hearing loss    a. s/p cochlear implant on right, hearing aid on left.  Marland Kitchen Hernia of anterior abdominal wall   . History of cardiovascular stress test    Lexiscan Myoview 8/16:  EF 58%, inferolateral scar, no ischemia; Low Risk  . Hyperlipidemia   . Hypothyroidism   . Ischemic cardiomyopathy    Echo 7/16: Septal and posterior lateral HK, EF 45-50%, mild LAE  . IV Contrast Allergy   . Near syncope 06/26/2014  . Obesity   . Old MI (myocardial infarction) 03/03/2014  . Retrograde amnesia 06/26/2014  . Rosacea   . Ventricular tachycardia ([place])    a. Immediate post pci/MI - no complications, on BB.    Patient Active Problem List   Diagnosis Date Noted  . TIA (transient ischemic attack) 12/22/2019  . Post-menopausal bleeding 08/17/2014  . Postmenopausal bleeding 08/16/2014  . Amnesia, global, transient   . Vaginal bleeding 06/26/2014  .  Retrograde amnesia 06/26/2014  . Acute post-hemorrhagic anemia 06/26/2014  . Near syncope 06/26/2014  . Old MI (myocardial infarction) 03/03/2014  . DOE (dyspnea on exertion) 07/30/2013  . Edema 04/27/2013  . Acute MI, inferoposterior wall, initial episode of care ([place]) 03/28/2013  . CAD (coronary artery disease) 03/28/2013  . Hypothyroidism 03/28/2013  . Hyperlipidemia 03/28/2013  . IV Contrast Allergy   . Rosacea   . Obesity   . Ventricular tachycardia ([place])   . Acute inferolateral myocardial infarction ([place]) 03/24/2013  . Acute myocardial infarction of other inferior wall, initial  episode of care   . Essential hypertension, benign   . GERD 11/19/2007    Past Surgical History:  Procedure Laterality Date  . CERVICAL POLYPECTOMY     2010  . CHOLECYSTECTOMY    . COCHLEAR IMPLANT    . LEFT HEART CATHETERIZATION WITH CORONARY ANGIOGRAM N/A 03/25/2013   Procedure: LEFT HEART CATHETERIZATION WITH CORONARY ANGIOGRAM;  Surgeon: Burnell Blanks, MD;  Location: [facility] CATH LAB;  Service: Cardiovascular;  Laterality: N/A;  . ROBOTIC ASSISTED TOTAL HYSTERECTOMY WITH BILATERAL SALPINGO OOPHERECTOMY Bilateral 08/16/2014   Procedure: ROBOTIC ASSISTED TOTAL HYSTERECTOMY WITH BILATERAL SALPINGO OOPHORECTOMY ;  Surgeon: Everitt Amber, MD;  Location: [facility] ORS;  Service: Gynecology;  Laterality: Bilateral;  . UPPER GASTROINTESTINAL ENDOSCOPY    . uterine polyp     had D and C done to remove the polyp     OB History   No obstetric history on file.     Family History  Problem Relation Age of Onset  . Heart failure Mother        died in her 55's.  . Cancer Mother   . Diabetes Mother   . Heart disease Mother   . Hypertension Mother   . Heart attack Mother   . Hyperlipidemia Sister   . Diabetes Sister   . Other Father        died in WWII  . Colon cancer Cousin   . Heart attack Maternal Grandmother   . Esophageal cancer Neg Hx   . Stomach cancer Neg Hx   . Stroke Neg Hx     Social History   Tobacco Use  . Smoking status: Former Smoker    Packs/day: 0.20    Years: 2.00    Pack years: 0.40    Types: Cigarettes    Quit date: 03/18/1968    Years since quitting: 51.7  . Smokeless tobacco: Never Used  Substance Use Topics  . Alcohol use: No  . Drug use: No    Home Medications Prior to Admission medications   Medication Sig Start Date End Date Taking? Authorizing Provider  Calcium Carb-Cholecalciferol (CALCIUM + D3) 600-200 MG-UNIT TABS Take 1 tablet by mouth every morning.     [provider]  Cyanocobalamin (VITAMIN B 12 PO) Take 1,000 tablets by mouth daily.     [provider]  diclofenac sodium (VOLTAREN) 1 % GEL Apply 2 g topically 2 (two) times daily. Applies to knees    [provider]  furosemide (LASIX) 20 MG tablet Take 1 tablet (20 mg total) by mouth daily. 05/10/19   Jettie Booze, MD  levothyroxine (SYNTHROID, LEVOTHROID) 100 MCG tablet Take 100 mcg by mouth daily before breakfast.    [provider]  lisinopril (ZESTRIL) 40 MG tablet TAKE 1 TABLET ONCE DAILY. 12/13/19   Jettie Booze, MD  metoprolol succinate (TOPROL-XL) 50 MG 24 hr tablet Take 1 tablet (50 mg total) by mouth daily.  Take with or immediately following a meal. 10/14/19   Jettie Booze, MD  metroNIDAZOLE (METROGEL) 0.75 % gel Apply 1 application topically daily. Patient places on her cheeks and nose, only about once or twice a week depending on the climate.    [provider]  NITROSTAT 0.4 MG SL tablet DISSOLVE 1 TABLET UNDER TONGUE AS NEEDED FOR CHEST PAIN,MAY REPEAT IN5 MINUTES FOR 2 DOSES. 05/10/19   Jettie Booze, MD  pantoprazole (PROTONIX) 40 MG tablet TAKE 1 TABLET ONCE DAILY. 11/04/19   Jettie Booze, MD  potassium chloride SA (KLOR-CON) 20 MEQ tablet Take 1 tablet (20 mEq total) by mouth daily. 05/10/19   Jettie Booze, MD  rivaroxaban (XARELTO) 20 MG TABS tablet Take 1 tablet (20 mg total) by mouth daily with supper. 12/22/19   Nipp, Carriel T, MD  rosuvastatin (CRESTOR) 10 MG tablet TAKE 1 TABLET 4 DAYS EACH WEEK. 05/10/19   Jettie Booze, MD    Allergies    Bee venom, Contrast media [iodinated diagnostic agents], Fish allergy, and Iohexol  Review of Systems   Review of Systems  Constitutional: Negative for activity change, chills and fever.  HENT: Negative for congestion and sore throat.   Eyes: Negative for visual disturbance.  Respiratory: Negative for cough, shortness of breath and wheezing.   Cardiovascular: Positive for palpitations. Negative for chest pain.  Gastrointestinal:  Negative for abdominal pain, constipation, diarrhea, nausea and vomiting.  Genitourinary: Negative for dysuria, flank pain and urgency.  Musculoskeletal: Negative for back pain, gait problem, myalgias, neck pain and neck stiffness.  Skin: Negative for rash.  Allergic/Immunologic: Negative for immunocompromised state.  Neurological: Negative for dizziness, seizures, syncope, weakness, light-headedness, numbness and headaches.  Psychiatric/Behavioral: Negative for confusion.    Physical Exam Updated Vital Signs BP (!) 184/99   Pulse 74   Temp 98.8 F (37.1 C) (Oral)   Resp 19   SpO2 97%   Physical Exam Vitals and nursing note reviewed.  Constitutional:      General: She is not in acute distress.    Appearance: She is obese. She is not ill-appearing or diaphoretic.     Comments: Pleasant.  Well-appearing.  No acute distress.  HENT:     Head: Normocephalic.     Mouth/Throat:     Mouth: Mucous membranes are moist.  Eyes:     Extraocular Movements: Extraocular movements intact.     Conjunctiva/sclera: Conjunctivae normal.     Pupils: Pupils are equal, round, and reactive to light.  Cardiovascular:     Rate and Rhythm: Normal rate and regular rhythm.     Heart sounds: No murmur heard.  No friction rub. No gallop.   Pulmonary:     Effort: Pulmonary effort is normal. No respiratory distress.     Breath sounds: No stridor. No wheezing, rhonchi or rales.  Chest:     Chest wall: No tenderness.  Abdominal:     General: There is no distension.     Palpations: Abdomen is soft. There is no mass.     Tenderness: There is no abdominal tenderness. There is no right CVA tenderness, left CVA tenderness, guarding or rebound.     Hernia: No hernia is present.  Musculoskeletal:     Cervical back: Normal range of motion and neck supple.     Right lower leg: No edema.     Left lower leg: No edema.  Skin:    General: Skin is warm.     Findings: No  rash.  Neurological:     Mental Status:  She is alert.     Comments: Cranial nerves II through XII are grossly intact.  Alert and oriented x4.  Answers questions appropriately.  No slurred speech.  Out of 5 strength against resistance of the bilateral upper and lower extremities.  Ambulatory with a cane.  Hard of hearing with cochlear implant on the right and hearing aid on the left.  Psychiatric:        Behavior: Behavior normal.     ED Results / Procedures / Treatments   Labs (all labs ordered are listed, but only abnormal results are displayed) Labs Reviewed  BASIC METABOLIC PANEL - Abnormal; Notable for the following components:      Result Value   Glucose, Bld 181 (*)    All other components within normal limits  TROPONIN I (HIGH SENSITIVITY) - Abnormal; Notable for the following components:   Troponin I (High Sensitivity) 24 (*)    All other components within normal limits  CBC  TSH  TROPONIN I (HIGH SENSITIVITY)    EKG EKG Interpretation  Date/Time:  Wednesday December 22 2019 00:49:12 EDT Ventricular Rate:  99 PR Interval:    QRS Duration: 103 QT Interval:  356 QTC Calculation: 457 R Axis:   -9 Text Interpretation: Sinus tachycardia Ventricular premature complex Inferior infarct, old Otherwise no significant change Confirmed by Deno Etienne [phone]) on 12/22/2019 1:29:46 AM   Radiology DG Chest 2 View  Result Date: 12/22/2019 CLINICAL DATA:  Palpitations EXAM: CHEST - 2 VIEW COMPARISON:  None. FINDINGS: The heart size and mediastinal contours are unchanged with mild cardiomegaly. Both lungs are clear. The visualized skeletal structures are unremarkable. IMPRESSION: No active cardiopulmonary disease. Electronically Signed   By: Prudencio Pair M.D.   On: 12/22/2019 00:33   CT HEAD CODE STROKE WO CONTRAST  Result Date: 12/22/2019 CLINICAL DATA:  Code stroke.  Altered mental status and confusion EXAM: CT HEAD WITHOUT CONTRAST TECHNIQUE: Contiguous axial images were obtained from the base of the skull through the  vertex without intravenous contrast. COMPARISON:  06/25/2014 FINDINGS: Brain: No evidence of acute infarction, hemorrhage, hydrocephalus, extra-axial collection or mass lesion/mass effect. Mild chronic white matter disease. Age normal brain volume Vascular: No hyperdense vessel (when allowing for streak artifact from the cochlear implant) or unexpected calcification. Skull: Normal. Negative for fracture or focal lesion. Sinuses/Orbits: No acute finding. Other: These results were communicated to Dr. Rory Percy at 7:03 amon 10/6/2021by text page via the [facility] messaging system. Right mastoidectomy for cochlear implantation. No unexpected finding. ASPECTS [facility] Stroke Program Early CT Score) Not scored with this history IMPRESSION: No acute finding. Electronically Signed   By: Monte Fantasia M.D.   On: 12/22/2019 07:05    Procedures .Critical Care Performed by: Joanne Gavel, PA-C Authorized by: Joanne Gavel, PA-C   Critical care provider statement:    Critical care time (minutes):  50   Critical care time was exclusive of:  Separately billable procedures and treating other patients and teaching time   Critical care was necessary to treat or prevent imminent or life-threatening deterioration of the following conditions:  Cardiac failure (Elevated troponin)   Critical care was time spent personally by me on the following activities:  Review of old charts, re-evaluation of patient's condition, pulse oximetry, ordering and review of radiographic studies, ordering and review of laboratory studies, ordering and performing treatments and interventions, obtaining history from patient or surrogate, examination of patient, evaluation of patient's response to  treatment, discussions with consultants and development of treatment plan with patient or surrogate   I assumed direction of critical care for this patient from another provider in my specialty: no     (including critical care time)  Medications Ordered  in ED Medications - No data to display  ED Course  I have reviewed the triage vital signs and the nursing notes.  Pertinent labs & imaging results that were available during my care of the patient were reviewed by me and considered in my medical decision making (see chart for details).  CHA2DS2/VAS Stroke Risk Points  Current as of 3 minutes ago     ? >= 2 Points: High Risk  1 - 1.99 Points: Medium Risk  0 Points: Low Risk    Last Change: N/A      Details    This score determines the patient's risk of having a stroke if the  patient has atrial fibrillation.    CHA2DS2-VASc Score = 5  The patient's score is based upon: CHF History: 1 HTN History: 1 Age : 2 Diabetes History: 0 Stroke History: 0 Vascular Disease History: 0 Gender: 1      ASSESSMENT AND PLAN: Atrial flutter with RVR Patient will start on Xarelto and increase our Toprol.  She will follow up with cardiology.   Signed,  Joanne Gavel, PA-C    12/22/2019 9:19 AM       MDM Rules/Calculators/A&P                          79 year old female with a history of hearing loss status post cochlear implant on the right, hearing aid on the left, CAD status post inferoposterior STEMI in January 2015 treated with a BMS to the left circumflex, HL, HTN, IV dye allergy presenting from home by POV with palpitations that awoke her from sleep tonight.  Initial EKG consistent with atrial flutter with RVR.  Heart rate noted to be in the 150s.  After arrival, palpitations resolved and heart rate improved to less than 100.  Repeat EKG now demonstrating sinus rhythm.  No history of atrial flutter or atrial fibrillation.  Patient was noted to be hypertensive at 160/100, but this is now improved to 150s over 70s.  She has had no further episodes of tachycardia.  The patient was seen and independently evaluated by Dr. Tyrone Nine, attending physician.  CBC and metabolic panel are unremarkable.  Unfortunately, troponin was ordered by triage  staff prior to my evaluation the patient. Initial troponin 9--> repeat 24.  Most likely this increases from demand as she is having no chest pain and there is no evidence of new ischemia on EKG.  However, given uptrending, cardiology was consulted and Dr. Blossom Hoops saw and evaluated the patient.  He recommended increasing her home metoprolol to 100 mg daily and starting on rivaroxaban given that her Chadsvasc2 score is 5.  On reevaluation, patient is in no acute distress.  She remained stable.  Discharge plan discussed at length with both the patient and her sister who is at bedside.  All questions answered.  She is hemodynamically stable and in no acute distress.  Safe for discharge to home with outpatient follow-up with cardiology.  Final Clinical Impression(s) / ED Diagnoses Final diagnoses:  Atrial flutter with rapid ventricular response [facility])    Rx / DC Orders ED Discharge Orders    None       Carmela Piechowski A, PA-C 12/22/19 0919  Deno Etienne, DO 12/23/19 1200

## 2019-12-22 NOTE — ED Notes (Signed)
EEG at bedside.

## 2019-12-22 NOTE — H&P (Addendum)
History and Physical    ROSE-MARIE HICKLING GEZ:[id number] DOB: [birth year] DOA: 12/22/2019  I have briefly reviewed the patient's prior medical records in [place]  PCP: Burnard Bunting, MD  Patient coming from: Home  Chief Complaint: Confusion  HPI: YANI COVENTRY is a 79 y.o. female with medical history significant of coronary artery disease with prior STEMI with stent in 2015, hypertension, cochlear implant, hyperlipidemia, hypothyroidism, chronic systolic CHF with recovered EF, presents to the hospital with chief complaint of confusion.  Presented to the ED last night with palpitations, she was found to be in a flutter with RVR.  Cardiology was consulted, recommending anticoagulation.  Her a flutter with RVR self resolved into the ER and converted back to sinus rhythm.  Due to stability she was discharged home, she left the ED around 5 AM, however on the way home patient suddenly became confused and she was brought back.  She was treated as code stroke and neurology was consulted.  CT scan of the brain was unremarkable.  She cannot have an MRI due to cochlear implant.  On my evaluation patient has no recollection of her ED visit last night and is intermittently confused.  Sister is at bedside and provides most of the story.  Patient sister tells me that she has a history of transient global amnesia after an episode of GI bleed several years back.  Currently, patient denies any weakness, denies any chest pain, denies any palpitations, no shortness of breath.  She has no headache, nausea or vomiting.  She denies any numbing or tingling in her arms or legs.  ED Course: In the emergency room she is afebrile, hypertensive with blood pressure in the 160s-190s, heart rate is in the 70s and she is satting 99% on room air.  Blood work shows high-sensitivity troponin 9 >> 24, followed by cardiology to be demand in the setting of a flutter.  Blood work is otherwise unremarkable.  CT of the brain  unremarkable.  Neurology was consulted and we are asked to admit  Review of Systems: Unable to obtain accurate and full review of systems due to confusion  Past Medical History:  Diagnosis Date  . Acute inferolateral myocardial infarction ([place]) 03/24/2013  . Acute MI, inferoposterior wall, initial episode of care ([place]) 03/28/2013  . Acute myocardial infarction of other inferior wall, initial episode of care   . Amnesia, global, transient   . Arthritis    needs left knee replacement  . CAD (coronary artery disease)    a. 03/2013 Infpost STEMI/PCI: LM nl, LAD min irregs, LCX 100 (3.0x12 Vision BMS), RCA  mild ostial dzs, EF 55%.  . Essential hypertension, benign   . Fibroids    a. uterine - spotting noted since 03/11/2013.  Marland Kitchen GERD 11/19/2007   Qualifier: Diagnosis of  By: Henrene Pastor MD, Docia Chuck   . GERD (gastroesophageal reflux disease)   . Hearing loss    a. s/p cochlear implant on right, hearing aid on left.  Marland Kitchen Hernia of anterior abdominal wall   . History of cardiovascular stress test    Lexiscan Myoview 8/16:  EF 58%, inferolateral scar, no ischemia; Low Risk  . Hyperlipidemia   . Hypothyroidism   . Ischemic cardiomyopathy    Echo 7/16: Septal and posterior lateral HK, EF 45-50%, mild LAE  . IV Contrast Allergy   . Near syncope 06/26/2014  . Obesity   . Old MI (myocardial infarction) 03/03/2014  . Retrograde amnesia 06/26/2014  . Rosacea   .  Ventricular tachycardia ([place])    a. Immediate post pci/MI - no complications, on BB.    Past Surgical History:  Procedure Laterality Date  . CERVICAL POLYPECTOMY     2010  . CHOLECYSTECTOMY    . COCHLEAR IMPLANT    . LEFT HEART CATHETERIZATION WITH CORONARY ANGIOGRAM N/A 03/25/2013   Procedure: LEFT HEART CATHETERIZATION WITH CORONARY ANGIOGRAM;  Surgeon: Burnell Blanks, MD;  Location: [facility] CATH LAB;  Service: Cardiovascular;  Laterality: N/A;  . ROBOTIC ASSISTED TOTAL HYSTERECTOMY WITH BILATERAL SALPINGO OOPHERECTOMY Bilateral 08/16/2014     Procedure: ROBOTIC ASSISTED TOTAL HYSTERECTOMY WITH BILATERAL SALPINGO OOPHORECTOMY ;  Surgeon: Everitt Amber, MD;  Location: [facility] ORS;  Service: Gynecology;  Laterality: Bilateral;  . UPPER GASTROINTESTINAL ENDOSCOPY    . uterine polyp     had D and C done to remove the polyp     reports that she quit smoking about 51 years ago. Her smoking use included cigarettes. She has a 0.40 pack-year smoking history. She has never used smokeless tobacco. She reports that she does not drink alcohol and does not use drugs.  Allergies  Allergen Reactions  . Bee Venom Itching and Swelling  . Contrast Media [Iodinated Diagnostic Agents] Hives    dye  . Fish Allergy Anaphylaxis    Shell fish  . Iohexol Hives    Family History  Problem Relation Age of Onset  . Heart failure Mother        died in her 62's.  . Cancer Mother   . Diabetes Mother   . Heart disease Mother   . Hypertension Mother   . Heart attack Mother   . Hyperlipidemia Sister   . Diabetes Sister   . Other Father        died in WWII  . Colon cancer Cousin   . Heart attack Maternal Grandmother   . Esophageal cancer Neg Hx   . Stomach cancer Neg Hx   . Stroke Neg Hx     Prior to Admission medications   Medication Sig Start Date End Date Taking? Authorizing Provider  Calcium Carb-Cholecalciferol (CALCIUM + D3) 600-200 MG-UNIT TABS Take 1 tablet by mouth every morning.    Yes [provider]  Cyanocobalamin (VITAMIN B 12 PO) Take 1,000 tablets by mouth daily.   Yes [provider]  diclofenac sodium (VOLTAREN) 1 % GEL Apply 2 g topically 2 (two) times daily. Applies to knees   Yes [provider]  furosemide (LASIX) 20 MG tablet Take 1 tablet (20 mg total) by mouth daily. 05/10/19  Yes Jettie Booze, MD  levothyroxine (SYNTHROID, LEVOTHROID) 100 MCG tablet Take 100 mcg by mouth daily before breakfast.   Yes [provider]  lisinopril (ZESTRIL) 40 MG tablet TAKE 1 TABLET ONCE DAILY. 12/13/19   Yes Jettie Booze, MD  metoprolol succinate (TOPROL-XL) 50 MG 24 hr tablet Take 1 tablet (50 mg total) by mouth daily. Take with or immediately following a meal. 10/14/19  Yes Jettie Booze, MD  metroNIDAZOLE (METROGEL) 0.75 % gel Apply 1 application topically daily. Patient places on her cheeks and nose, only about once or twice a week depending on the climate.   Yes [provider]  NITROSTAT 0.4 MG SL tablet DISSOLVE 1 TABLET UNDER TONGUE AS NEEDED FOR CHEST PAIN,MAY REPEAT IN5 MINUTES FOR 2 DOSES. 05/10/19  Yes Jettie Booze, MD  pantoprazole (PROTONIX) 40 MG tablet TAKE 1 TABLET ONCE DAILY. 11/04/19  Yes Jettie Booze, MD  potassium  chloride SA (KLOR-CON) 20 MEQ tablet Take 1 tablet (20 mEq total) by mouth daily. 05/10/19  Yes Jettie Booze, MD  rivaroxaban (XARELTO) 20 MG TABS tablet Take 1 tablet (20 mg total) by mouth daily with supper. 12/22/19  Yes Nipp, Carriel T, MD  rosuvastatin (CRESTOR) 10 MG tablet TAKE 1 TABLET 4 DAYS EACH WEEK. 05/10/19  Yes Jettie Booze, MD    Physical Exam: Vitals:   12/22/19 0644  BP: (!) 193/89  Pulse: 85  Resp: 16  Temp: 98 F (36.7 C)  TempSrc: Oral  SpO2: 99%    Constitutional: NAD, calm, comfortable Eyes: PERRL, lids and conjunctivae normal ENMT: Mucous membranes are moist. Neck: normal, supple Respiratory: clear to auscultation bilaterally, no wheezing, no crackles. Normal respiratory effort. No accessory muscle use.  Cardiovascular: Regular rate and rhythm, no murmurs / rubs / gallops. No extremity edema. 2+ pedal pulses.  Abdomen: no tenderness, no masses palpated. Bowel sounds positive.  Musculoskeletal: no clubbing / cyanosis. Normal muscle tone.  Skin: no rashes, lesions, ulcers. No induration Neurologic: CN 2-12 grossly intact. Strength 5/5 in all 4.  Psychiatric: AxOx2, self, place but not situation / time  Labs on Admission: I have personally reviewed following labs and imaging  studies  CBC: Recent Labs  Lab 12/22/19 0010  WBC 7.6  HGB 14.4  HCT 43.8  MCV 90.5  PLT 601   Basic Metabolic Panel: Recent Labs  Lab 12/22/19 0010  NA 137  K 3.6  CL 103  CO2 24  GLUCOSE 181*  BUN 15  CREATININE 0.85  CALCIUM 9.6   Liver Function Tests: No results for input(s): AST, ALT, ALKPHOS, BILITOT, PROT, ALBUMIN in the last 168 hours. Coagulation Profile: No results for input(s): INR, PROTIME in the last 168 hours. BNP (last 3 results) No results for input(s): PROBNP in the last 8760 hours. CBG: No results for input(s): GLUCAP in the last 168 hours. Thyroid Function Tests: No results for input(s): TSH, T4TOTAL, FREET4, T3FREE, THYROIDAB in the last 72 hours. Urine analysis:    Component Value Date/Time   COLORURINE YELLOW 08/17/2014 0919   APPEARANCEUR CLEAR 08/17/2014 0919   LABSPEC 1.006 08/17/2014 0919   PHURINE 6.0 08/17/2014 0919   GLUCOSEU NEGATIVE 08/17/2014 0919   HGBUR TRACE (A) 08/17/2014 0919   BILIRUBINUR NEGATIVE 08/17/2014 0919   KETONESUR NEGATIVE 08/17/2014 0919   PROTEINUR NEGATIVE 08/17/2014 0919   UROBILINOGEN 0.2 08/17/2014 0919   NITRITE NEGATIVE 08/17/2014 0919   LEUKOCYTESUR TRACE (A) 08/17/2014 0919     Radiological Exams on Admission: DG Chest 2 View  Result Date: 12/22/2019 CLINICAL DATA:  Palpitations EXAM: CHEST - 2 VIEW COMPARISON:  None. FINDINGS: The heart size and mediastinal contours are unchanged with mild cardiomegaly. Both lungs are clear. The visualized skeletal structures are unremarkable. IMPRESSION: No active cardiopulmonary disease. Electronically Signed   By: Prudencio Pair M.D.   On: 12/22/2019 00:33   CT HEAD CODE STROKE WO CONTRAST  Result Date: 12/22/2019 CLINICAL DATA:  Code stroke.  Altered mental status and confusion EXAM: CT HEAD WITHOUT CONTRAST TECHNIQUE: Contiguous axial images were obtained from the base of the skull through the vertex without intravenous contrast. COMPARISON:  06/25/2014  FINDINGS: Brain: No evidence of acute infarction, hemorrhage, hydrocephalus, extra-axial collection or mass lesion/mass effect. Mild chronic white matter disease. Age normal brain volume Vascular: No hyperdense vessel (when allowing for streak artifact from the cochlear implant) or unexpected calcification. Skull: Normal. Negative for fracture or focal lesion. Sinuses/Orbits: No acute  finding. Other: These results were communicated to Dr. Rory Percy at 7:03 amon 10/6/2021by text page via the [facility] messaging system. Right mastoidectomy for cochlear implantation. No unexpected finding. ASPECTS [facility] Stroke Program Early CT Score) Not scored with this history IMPRESSION: No acute finding. Electronically Signed   By: Monte Fantasia M.D.   On: 12/22/2019 07:05    EKG: Independently reviewed. Sinus rhythm  Assessment/Plan  Principal Problem Acute confusional state-differential is CVA in the setting of newly diagnosed a flutter, TIA, transient global amnesia -Neurology consulted, will admit for stroke work-up, 2D echo, lipid panel, A1c.  -D/w Dr. Erlinda Hong, hold CT angio, repeat the CT scan within 24 hours given inability to use MRI, he will obtain a transcranial Doppler.  Recommends holding Xarelto until CT scan tomorrow and better blood pressure control. -Allow permissive hypertension up to 366 systolic per neurology  Active Problems Newly diagnosed a flutter, paroxysmal-currently she is in sinus rhythm, continue metoprolol, resume Xarelto tomorrow  Essential hypertension-hold Lasix and lisinopril to allow blood pressure to run on the higher side, use metoprolol for rate control  Coronary artery disease with history of STEMI/PCI-currently no chest pain, continue home medications, beta-blocker, statin.  She had mild troponin elevation felt to be due to demand, will check 1 more to ensure stability given change in clinical status  Chronic systolic CHF-appears euvolemic on admission, most recent 2D echo done in  2016 showed EF 45-60% with septal and lateral hypokinesis.  This was followed by stress test which actually showed EF 55-65%, scar from prior MI, and  was a low risk study  Hypothyroidism -resume home medications with Synthroid, check TSH given new onset a flutter  Hyperlipidemia-continue statin   DVT prophylaxis: On Xarelto Code Status: Full code (patient unable to tell me, sister is not sure) Family Communication: Sister at bedside Disposition Plan: Admit to cardiac telemetry, likely home when ready Consults called: Neurology Obs/Inp: Observation   Marzetta Board, MD, PhD [facility]  Contact via [email or web address]  12/22/2019, 8:14 AM

## 2019-12-22 NOTE — Progress Notes (Signed)
  Echocardiogram 2D Echocardiogram has been performed.  Jennette Dubin 12/22/2019, 10:44 AM

## 2019-12-22 NOTE — Consult Note (Signed)
Cardiology Consult    Patient ID: [name] MRN: [id number], DOB/AGE: 77/19/1942   Admit date: 12/21/2019 Date of Consult: 12/22/2019  Primary Physician: Burnard Bunting, MD Primary Cardiologist: Larae Grooms, MD Requesting Provider: Joline Maxcy, PA-C  Patient Profile    [name] is a 79 y.o. female with istoyr of CAD with prior inferoposterior STEMI in 2015 with BMS to LCx, HLD, HTN, contrast allergy, and post-pCI NSVT who presented with atrial flutter that quickly resolved.  History of Present Illness    [name] was in her usual state of health this evening when [name] developed a fullness in her throat and racing heart that provoked her to take her blood pressure and heart rate, both of which [name] found to be elevated.  No associated shortness of breath, chest pain, orthopnea, PND, or antecedent heart failure symptoms.  Given her disease and fast heart rate [name] called EMS who found her to be in atrial flutter with rapid ventricular response.  [name] was bolused diltiazem en route and her atrial flutter terminated between transport from EMS to stretcher here, though review of tracings prior to presentation confirms typical AFL.  [name]'s had no prior episodes and doesn't recall any prior episodes.  No history of bleeding after her hysterectomy (some prior vaginal bleeding resolved post-hysterectomy).  [name] follows with Dr. Irish Lack for her CAD and has had some issues with bleeding.  Despite lack of chest pain troponin was ordered which was 9 initially and 24 on repeat.  [name] continued to deny chest pain or symptoms with resolved arrhythmia on my evaluation.  Past Medical History   Past Medical History:  Diagnosis Date  . Acute inferolateral myocardial infarction ([place]) 03/24/2013  . Acute MI, inferoposterior wall, initial episode of care ([place]) 03/28/2013  . Acute myocardial infarction of other inferior wall, initial episode of care   . Amnesia, global, transient   .  Arthritis    needs left knee replacement  . CAD (coronary artery disease)    a. 03/2013 Infpost STEMI/PCI: LM nl, LAD min irregs, LCX 100 (3.0x12 Vision BMS), RCA  mild ostial dzs, EF 55%.  . Essential hypertension, benign   . Fibroids    a. uterine - spotting noted since 03/11/2013.  Marland Kitchen GERD 11/19/2007   Qualifier: Diagnosis of  By: Henrene Pastor MD, Docia Chuck   . GERD (gastroesophageal reflux disease)   . Hearing loss    a. s/p cochlear implant on right, hearing aid on left.  Marland Kitchen Hernia of anterior abdominal wall   . History of cardiovascular stress test    Lexiscan Myoview 8/16:  EF 58%, inferolateral scar, no ischemia; Low Risk  . Hyperlipidemia   . Hypothyroidism   . Ischemic cardiomyopathy    Echo 7/16: Septal and posterior lateral HK, EF 45-50%, mild LAE  . IV Contrast Allergy   . Near syncope 06/26/2014  . Obesity   . Old MI (myocardial infarction) 03/03/2014  . Retrograde amnesia 06/26/2014  . Rosacea   . Ventricular tachycardia ([place])    a. Immediate post pci/MI - no complications, on BB.    Past Surgical History:  Procedure Laterality Date  . CERVICAL POLYPECTOMY     2010  . CHOLECYSTECTOMY    . COCHLEAR IMPLANT    . LEFT HEART CATHETERIZATION WITH CORONARY ANGIOGRAM N/A 03/25/2013   Procedure: LEFT HEART CATHETERIZATION WITH CORONARY ANGIOGRAM;  Surgeon: Burnell Blanks, MD;  Location: [facility] CATH LAB;  Service: Cardiovascular;  Laterality: N/A;  . ROBOTIC ASSISTED TOTAL  HYSTERECTOMY WITH BILATERAL SALPINGO OOPHERECTOMY Bilateral 08/16/2014   Procedure: ROBOTIC ASSISTED TOTAL HYSTERECTOMY WITH BILATERAL SALPINGO OOPHORECTOMY ;  Surgeon: Everitt Amber, MD;  Location: [facility] ORS;  Service: Gynecology;  Laterality: Bilateral;  . UPPER GASTROINTESTINAL ENDOSCOPY    . uterine polyp     had D and C done to remove the polyp     Allergies  Allergen Reactions  . Bee Venom Itching and Swelling  . Contrast Media [Iodinated Diagnostic Agents] Hives    dye  . Iohexol Hives    Family History      Family History  Problem Relation Age of Onset  . Heart failure Mother        died in her 28's.  . Cancer Mother   . Diabetes Mother   . Heart disease Mother   . Hypertension Mother   . Heart attack Mother   . Hyperlipidemia Sister   . Diabetes Sister   . Other Father        died in WWII  . Colon cancer Cousin   . Heart attack Maternal Grandmother   . Esophageal cancer Neg Hx   . Stomach cancer Neg Hx   . Stroke Neg Hx    [name] indicated that her mother is deceased. [name] indicated that her father is deceased. [name] indicated that her sister is alive. [name] indicated that the status of her maternal grandmother is unknown. [name] indicated that the status of her cousin is unknown. [name] indicated that the status of her neg hx is unknown.   Social History    Social History   Socioeconomic History  . Marital status: Divorced    Spouse name: Not on file  . Number of children: Not on file  . Years of education: Not on file  . Highest education level: Not on file  Occupational History  . Occupation: retired from Data processing manager work  Tobacco Use  . Smoking status: Former Smoker    Packs/day: 0.20    Years: 2.00    Pack years: 0.40    Types: Cigarettes    Quit date: 03/18/1968    Years since quitting: 51.7  . Smokeless tobacco: Never Used  Substance and Sexual Activity  . Alcohol use: No  . Drug use: No  . Sexual activity: Not on file  Other Topics Concern  . Not on file  Social History Narrative   Lives in [place] by herself.  Sister is nearby and is Healthcare POA.   Social Determinants of Health   Financial Resource Strain:   . Difficulty of Paying Living Expenses: Not on file  Food Insecurity:   . Worried About Charity fundraiser in the Last Year: Not on file  . Ran Out of Food in the Last Year: Not on file  Transportation Needs:   . Lack of Transportation (Medical): Not on file  . Lack of Transportation (Non-Medical): Not on file  Physical Activity:   . Days of Exercise  per Week: Not on file  . Minutes of Exercise per Session: Not on file  Stress:   . Feeling of Stress : Not on file  Social Connections:   . Frequency of Communication with Friends and Family: Not on file  . Frequency of Social Gatherings with Friends and Family: Not on file  . Attends Religious Services: Not on file  . Active Member of Clubs or Organizations: Not on file  . Attends Archivist Meetings: Not on file  . Marital Status: Not on file  Intimate Partner Violence:   . Fear of Current or Ex-Partner: Not on file  . Emotionally Abused: Not on file  . Physically Abused: Not on file  . Sexually Abused: Not on file     Review of Systems    General:  No chills, fever, night sweats or weight changes.  Cardiovascular:  No chest pain, dyspnea on exertion, edema, orthopnea, palpitations, paroxysmal nocturnal dyspnea. Dermatological: No rash, lesions/masses Respiratory: No cough, dyspnea Urologic: No hematuria, dysuria Abdominal:   No nausea, vomiting, diarrhea, bright red blood per rectum, melena, or hematemesis Neurologic:  No visual changes, wkns, changes in mental status. All other systems reviewed and are otherwise negative except as noted above.  Physical Exam    Blood pressure (!) 184/99, pulse 74, temperature 98.8 F (37.1 C), temperature source Oral, resp. rate 19, SpO2 97 %.    No intake or output data in the 24 hours ending 12/22/19 0528 Wt Readings from Last 3 Encounters:  10/14/19 (!) 101.1 kg  10/07/18 102.4 kg  12/11/17 100.1 kg    CONSTITUTIONAL: alert and conversant, well-appearing, nourished, no distress HEENT: oropharynx clear and moist, no mucosal lesions, normal dentition, conjunctiva normal, EOM intact, pupils equal, no lid lag. NECK: supple, no cervical adenopathy, no thyromegaly CARDIOVASCULAR: Regular rhythm. II/VI HSM best appreciated at apex and radiating to axilla. PULMONARY/CHEST WALL: no deformities, normal breath sounds bilaterally,  normal work of breathing ABDOMINAL: soft, non-tender, non-distended EXTREMITIES: no edema or muscle atrophy, warm and well-perfused SKIN: Dry and intact without apparent rashes or wounds. NEUROLOGIC: alert, normal gait, no abnormal movements, cranial nerves grossly intact.   Labs    Troponin [facility] of Care Test) TroponinI 9, 24  Lab Results  Component Value Date   WBC 7.6 12/22/2019   HGB 14.4 12/22/2019   HCT 43.8 12/22/2019   MCV 90.5 12/22/2019   PLT 203 12/22/2019    Recent Labs  Lab 12/22/19 0010  NA 137  K 3.6  CL 103  CO2 24  BUN 15  CREATININE 0.85  CALCIUM 9.6  GLUCOSE 181*   Lab Results  Component Value Date   CHOL 148 09/30/2017   HDL 46 09/30/2017   LDLCALC 78 09/30/2017   TRIG 119 09/30/2017   Lab Results  Component Value Date   DDIMER 1.70 (H) 05/16/2017     Radiology Studies    DG Chest 2 View  Result Date: 12/22/2019 CLINICAL DATA:  Palpitations EXAM: CHEST - 2 VIEW COMPARISON:  None. FINDINGS: The heart size and mediastinal contours are unchanged with mild cardiomegaly. Both lungs are clear. The visualized skeletal structures are unremarkable. IMPRESSION: No active cardiopulmonary disease. Electronically Signed   By: Prudencio Pair M.D.   On: 12/22/2019 00:33    ECG & Cardiac Imaging    0200 NSR with OIMI and rare PVC. 0400 Typical atrial flutter with 2:1 AV conduction  Assessment & Plan   79 year old female with history of coronary artery disease and prior inferoposterior STEMI s/p PCI of LCx with BMS in 2015, HLD, HTN, obesity presenting with typical atrial flutter self-resolving in emergency department.  Reviewed with patient that this is easily treated arrhythmia with simple CTI line which can be arranged as outpatient, though [name] will require anticoagulation prior to this to which [name] is amenable.  Bleeding concerns are minimal post hysterectomy.  I did note a murmur on exam which has not been remarked on previously.  It sounded  consistent with mitral regurgitation which could be predisposing to atrial  arrhythmia so will order updated echocardiogram as outpatient as well.  TSH added on and pending.  [name] is having no chest pain and post-conversion EKG shows now ST changes so troponin elevation is representative of supply demand mismatch.  Strong return precautions given.  Problem list Typical AFL CAD s/p BMS LCx in context STEMI 2015 Murmur Troponin elevation  Plan - Start rivaroxaban 20 mg daily with evening meal - Increase metoprolol succinate to 100 mg daily for rate control, mild antiarrhythmic effect - Stop aspirin with initiation of rivaroxaban - I will let Dr. Irish Lack know about presentation here in event that he agrees referral to EP for CTI ablation is appropriate.  Signed, Delight Hoh, MD 12/22/2019, 5:28 AM  For questions or updates, please contact   Please consult [email or web address] for contact info under Cardiology/STEMI.

## 2019-12-22 NOTE — Progress Notes (Signed)
PT Cancellation Note  Patient Details Name: SHAKEITHA UMBAUGH MRN: [id number] DOB: Dec 02, 1940   Cancelled Treatment:    Reason Eval/Treat Not Completed: Other (comment) Pt currently being transferred to floor. Will follow up as schedule allows.   Lou Miner, DPT  Acute Rehabilitation Services  Pager: [phone] Office: [phone]    Rudean Hitt 12/22/2019, 5:04 PM

## 2019-12-22 NOTE — Progress Notes (Signed)
Patient at vasular per RN = will check back at [place]

## 2019-12-22 NOTE — Progress Notes (Signed)
Pt has been admitted to the unit via bed. Pt reports no pain all IV and tele are connected and intact. All belongings sent with patient and family member is at the bedside. Telephone and call light are within reach   12/22/19 1716  Vitals  Temp 98.3 F (36.8 C)  Temp Source Oral  BP (!) 157/59  MAP (mmHg) 89  BP Location Right Arm  BP Method Automatic  Patient Position (if appropriate) Lying  Pulse Rate 70  Pulse Rate Source Dinamap  Resp 20  Level of Consciousness  Level of Consciousness Alert  MEWS COLOR  MEWS Score Color Green  Oxygen Therapy  SpO2 98 %  O2 Device Room Air  Pain Assessment  Pain Scale 0-10  Pain Score 0  MEWS Score  MEWS Temp 0  MEWS Systolic 0  MEWS Pulse 0  MEWS RR 0  MEWS LOC 0  MEWS Score 0

## 2019-12-22 NOTE — Progress Notes (Signed)
EEG complete - results pending 

## 2019-12-22 NOTE — ED Notes (Signed)
Called lab to find out why trop has not resulted.  Reported it wasn't received until 255am even though this RN tubed it at 0209 and was placed on instrument [phone]

## 2019-12-22 NOTE — Discharge Instructions (Addendum)
Thank you for allowing me to care for you today in the Emergency Department.   Please increase her home metoprolol from 1 tablet (50 mg daily) to 2 tablets daily (100 mg).   Dr. Blossom Hoops is calling in a prescription of Xarelto to your pharmacy, [facility].  Take as prescribed.  Please know that Xarelto is a blood thinner.  If you fall, especially if you hit your head, while taking this medication you need to come to the emergency department for evaluation.  You should be receiving a call from Dr. Hassell Done office to schedule your follow-up.  Return to the emergency department if you develop racing heart, severe chest pain, difficulty breathing, accompanied by dizziness or lightheadedness, if you pass out, or develop other new, concerning symptoms, please return to the emergency department for reevaluation.

## 2019-12-22 NOTE — Code Documentation (Signed)
Stroke Response Nurse Documentation Code Documentation  KHYLAH KENDRA is a 79 y.o. female arriving to [place] ED via Private Vehicle on 12/22/19 with past medical hx significant of myocardial infarct, hyperlipidemia, hypertension, obesity, with recent admit for new onset atrial flutter. Was discharged home with sister earlier this morning. Sister noted confusion and brought patient back to ED. Code stroke was activated by ED.    Dr. Rory Percy in [place] with patient upon stroke response nurse arrival. NIHSS 2, see documentation for details and code stroke times. Patient with disoriented on exam. The following imaging was completed: CT. Difficulty placing PIV for CTA. IV placed and labs drawn. Patient stated allergy to contrast. Taken back to ED for pre medication. MRI not an option as patient has cochlear implant on the right. [facility] screening preformed prior to PO meds. Passed and given PO prednisone and IV Benadryl. BP elevated 194/90. Dr. Rory Percy notified and 10mg  labetalol IV given. BP 179/108.  Patient is not a candidate for tPA due to no focal deficits. Sister at bedside in ED. Care/Plan admit to hospital for monitoring and diagnostic scans. Keep SBP <180 per Dr. Rory Percy. Bedside handoff with ED RN Carlis Abbott.    Leverne Humbles  Stroke Response RN

## 2019-12-22 NOTE — Consult Note (Addendum)
Neurology Consultation  Reason for Consult: Code stroke-confusion Referring Physician: Joline Maxcy PAC/Dan Tyrone Nine, MD  CC: Confusion  History is obtained from: patient and chart  HPI: [name] is a 79 y.o. female past medical history of acute MI, TGA in the past, hypertension, history of fibroids status post hysterectomy, obesity, presenting to the emergency room for evaluation of confusion. She was in the emergency room earlier tonight, was seen by the ED providers and cardiology for new onset atrial flutter, recommended to be started on anticoagulation with the rivaroxaban and discharged home.  On the way home and in the car, the family member who brought her to the ER and took her back noticed that she was acting confused and not acting like herself.  They called the hospital back and were instructed to come back to the ER.  Code stroke was activated for acute change in her mentation. Patient's unable to provide much reliable history. She is also very hard of hearing with cochlear implant on one side and hearing aid on the other. Denies any chest pain shortness of breath nausea vomiting at this time. Denies any tingling or numbness. Denies any headache  On evaluation in the emergency room while taking for a CAT scan as a part of code stroke work-up, she repetitively asked why she was in the hospital and upon being told the reason she seemed to understand but then asked the same question very short while later.   LKW: 5:40 AM when she was discharged from the ER today tpa given?: no, nonspecific exam Premorbid modified Rankin scale (mRS): 0  ROS: As above in HPI -rest of the review negative.  Past Medical History:  Diagnosis Date  . Acute inferolateral myocardial infarction ([place]) 03/24/2013  . Acute MI, inferoposterior wall, initial episode of care ([place]) 03/28/2013  . Acute myocardial infarction of other inferior wall, initial episode of care   . Amnesia, global, transient   .  Arthritis    needs left knee replacement  . CAD (coronary artery disease)    a. 03/2013 Infpost STEMI/PCI: LM nl, LAD min irregs, LCX 100 (3.0x12 Vision BMS), RCA  mild ostial dzs, EF 55%.  . Essential hypertension, benign   . Fibroids    a. uterine - spotting noted since 03/11/2013.  Marland Kitchen GERD 11/19/2007   Qualifier: Diagnosis of  By: Henrene Pastor MD, Docia Chuck   . GERD (gastroesophageal reflux disease)   . Hearing loss    a. s/p cochlear implant on right, hearing aid on left.  Marland Kitchen Hernia of anterior abdominal wall   . History of cardiovascular stress test    Lexiscan Myoview 8/16:  EF 58%, inferolateral scar, no ischemia; Low Risk  . Hyperlipidemia   . Hypothyroidism   . Ischemic cardiomyopathy    Echo 7/16: Septal and posterior lateral HK, EF 45-50%, mild LAE  . IV Contrast Allergy   . Near syncope 06/26/2014  . Obesity   . Old MI (myocardial infarction) 03/03/2014  . Retrograde amnesia 06/26/2014  . Rosacea   . Ventricular tachycardia ([place])    a. Immediate post pci/MI - no complications, on BB.    Family History  Problem Relation Age of Onset  . Heart failure Mother        died in her 25's.  . Cancer Mother   . Diabetes Mother   . Heart disease Mother   . Hypertension Mother   . Heart attack Mother   . Hyperlipidemia Sister   . Diabetes Sister   .  Other Father        died in WWII  . Colon cancer Cousin   . Heart attack Maternal Grandmother   . Esophageal cancer Neg Hx   . Stomach cancer Neg Hx   . Stroke Neg Hx    Social History:   reports that she quit smoking about 51 years ago. Her smoking use included cigarettes. She has a 0.40 pack-year smoking history. She has never used smokeless tobacco. She reports that she does not drink alcohol and does not use drugs.  Medications No current facility-administered medications for this encounter.  Current Outpatient Medications:  .  Calcium Carb-Cholecalciferol (CALCIUM + D3) 600-200 MG-UNIT TABS, Take 1 tablet by mouth every morning.  , Disp: , Rfl:  .  Cyanocobalamin (VITAMIN B 12 PO), Take 1,000 tablets by mouth daily., Disp: , Rfl:  .  diclofenac sodium (VOLTAREN) 1 % GEL, Apply 2 g topically 2 (two) times daily. Applies to knees, Disp: , Rfl:  .  furosemide (LASIX) 20 MG tablet, Take 1 tablet (20 mg total) by mouth daily., Disp: 90 tablet, Rfl: 1 .  levothyroxine (SYNTHROID, LEVOTHROID) 100 MCG tablet, Take 100 mcg by mouth daily before breakfast., Disp: , Rfl:  .  lisinopril (ZESTRIL) 40 MG tablet, TAKE 1 TABLET ONCE DAILY., Disp: 90 tablet, Rfl: 0 .  metoprolol succinate (TOPROL-XL) 50 MG 24 hr tablet, Take 1 tablet (50 mg total) by mouth daily. Take with or immediately following a meal., Disp: 90 tablet, Rfl: 3 .  metroNIDAZOLE (METROGEL) 0.75 % gel, Apply 1 application topically daily. Patient places on her cheeks and nose, only about once or twice a week depending on the climate., Disp: , Rfl:  .  NITROSTAT 0.4 MG SL tablet, DISSOLVE 1 TABLET UNDER TONGUE AS NEEDED FOR CHEST PAIN,MAY REPEAT IN5 MINUTES FOR 2 DOSES., Disp: 25 tablet, Rfl: 3 .  pantoprazole (PROTONIX) 40 MG tablet, TAKE 1 TABLET ONCE DAILY., Disp: 90 tablet, Rfl: 3 .  potassium chloride SA (KLOR-CON) 20 MEQ tablet, Take 1 tablet (20 mEq total) by mouth daily., Disp: 90 tablet, Rfl: 1 .  rivaroxaban (XARELTO) 20 MG TABS tablet, Take 1 tablet (20 mg total) by mouth daily with supper., Disp: 30 tablet, Rfl: 11 .  rosuvastatin (CRESTOR) 10 MG tablet, TAKE 1 TABLET 4 DAYS EACH WEEK., Disp: 48 tablet, Rfl: 1  Exam: Current vital signs: BP (!) 193/89 (BP Location: Right Arm)   Pulse 85   Temp 98 F (36.7 C) (Oral)   Resp 16   SpO2 99%  Vital signs in last 24 hours: Temp:  [98 F (36.7 C)-98.8 F (37.1 C)] 98 F (36.7 C) (10/06 0644) Pulse Rate:  [65-150] 85 (10/06 0644) Resp:  [11-19] 16 (10/06 0644) BP: (154-193)/(65-101) 193/89 (10/06 0644) SpO2:  [96 %-100 %] 99 % (10/06 0644) General: Awake alert in no distress HEENT: Normocephalic  atraumatic Lungs: Clear to auscultation Cardiovascular: Irregularly irregular Abdomen nondistended nontender Extremities warm well perfused Neurological exam Awake alert oriented x2 Speech is clear No evidence of aphasia Follows all commands Repetitive questioning - keeps asking why she is here, and repeats again after few minutes. Repetitively reminds staff that she has a cochlear implant. Cranial nerves: Pupils equal round react light, extraocular movements intact, visual field full, face symmetric, tongue and palate midline. Extremely hard of hearing - right cochlear implant, left hearing aid.  Motor exam: No drift in any of the 4 extremities Sensory exam intact light touch Coordination: No dysmetria in finger-nose-finger testing NIH  stroke scale: 0  Labs I have reviewed labs in epic and the results pertinent to this consultation are:  CBC    Component Value Date/Time   WBC 7.6 12/22/2019 0010   RBC 4.84 12/22/2019 0010   HGB 14.4 12/22/2019 0010   HGB 11.8 08/29/2014 1037   HCT 43.8 12/22/2019 0010   HCT 37.9 08/29/2014 1037   PLT 203 12/22/2019 0010   PLT 214 08/29/2014 1037   MCV 90.5 12/22/2019 0010   MCV 78.5 (L) 08/29/2014 1037   MCH 29.8 12/22/2019 0010   MCHC 32.9 12/22/2019 0010   RDW 12.6 12/22/2019 0010   RDW 14.8 (H) 08/29/2014 1037   LYMPHSABS 1.1 06/26/2014 0056   MONOABS 0.5 06/26/2014 0056   EOSABS 0.1 06/26/2014 0056   BASOSABS 0.0 06/26/2014 0056    CMP     Component Value Date/Time   NA 137 12/22/2019 0010   NA 140 07/08/2017 0859   K 3.6 12/22/2019 0010   CL 103 12/22/2019 0010   CO2 24 12/22/2019 0010   GLUCOSE 181 (H) 12/22/2019 0010   BUN 15 12/22/2019 0010   BUN 16 07/08/2017 0859   CREATININE 0.85 12/22/2019 0010   CREATININE 0.76 11/27/2015 1237   CALCIUM 9.6 12/22/2019 0010   PROT 6.7 09/30/2017 0832   ALBUMIN 4.2 09/30/2017 0832   AST 18 09/30/2017 0832   ALT 13 09/30/2017 0832   ALKPHOS 92 09/30/2017 0832   BILITOT 0.5  09/30/2017 0832   GFRNONAA >60 12/22/2019 0010   GFRAA 89 07/08/2017 0859    Imaging I have reviewed the images obtained:  CT-scan of the brain-no acute changes  MRI examination of the brain-can not be done due to cochlear implant  CTA head and neck-ordered and pending  Chest Xray - no acute findings  Assessment: 79 year old with above past medical history, recently seen a few hours ago in the ER for new onset atrial flutter, evaluated by cardiology with recommendations to start anticoagulation, presenting back to the ER with evaluation needed for confusion. Family members report that she is not acting like herself and appears confused.  She does not remember that she was in the ER and could not recollect the interaction with the same providers who took care of her an hour ago. SBP noted in 190s on arrival. She has a history of TGA in the past and I suspect that this episode is likely another TGA.  Although seizures are supposed to be mostly monophasic, there have been multiple reports of recurrent TGA's. A small stroke also cannot be ruled out given her recent atrial flutter and nonanticoagulated status. I think she would benefit from being observed for TGA versus TIA work-up. MRI is not possible due to cochlear implant.  Impression: -Stroke like symptoms with differentials including TGA vs TIA vs HTN emergency/urgency -New onset A flutter - prescribed anticoagulation with Rivaroxaban this morning by cardiology  Recommendations: Stat CTA head and neck - low suspicion for LVO but will complete work up. Admit for observation Frequent neurocjecks Management of BP - allow permissive HTN since stroke is on differentials but only till 180 - treat for any SBP more than 180 on a PRN basis. From tomorrow, further reduction of BP by 20% each day as you would for HTN urgency/emergency Telemetry Echo A1c Lipid panel PT OT ST C/W Rivaroxaban as recommended by cardiology NPO unless  cleared by stroke swallow screen or formal swallow eval Check UA  Stroke neurology will follow up with you.  Addendum  Patient allergic to IV contrast We will have to do a long prep and will do a CTA at that time. We will order the prep   -- Amie Portland, MD [facility] Pager: [phone] If 7pm to 7am, please call on call as listed on AMION.  CRITICAL CARE ATTESTATION Performed by: Amie Portland, MD Total critical care time: 39 minutes Critical care time was exclusive of separately billable procedures and treating other patients and/or supervising APPs/Residents/Students Critical care was necessary to treat or prevent imminent or life-threatening deterioration due to stroke like symptoms, hypertensive urgency/emergency This patient is critically ill and at significant risk for neurological worsening and/or death and care requires constant monitoring. Critical care was time spent personally by me on the following activities: development of treatment plan with patient and/or surrogate as well as nursing, discussions with consultants, evaluation of patient's response to treatment, examination of patient, obtaining history from patient or surrogate, ordering and performing treatments and interventions, ordering and review of laboratory studies, ordering and review of radiographic studies, pulse oximetry, re-evaluation of patient's condition, participation in multidisciplinary rounds and medical decision making of high complexity in the care of this patient.

## 2019-12-22 NOTE — ED Triage Notes (Signed)
Pt presents to ED POV. Pt c/o feeling like her heart is "fluttering." per pt she was in bed when feeling began, no pain. HR and BP was high at home. Reports SOB initially, has subsided now. resp e/u. Pt hard of hearing

## 2019-12-22 NOTE — ED Triage Notes (Addendum)
Pt presents to ED POV. Pt seen here earlier for a. Fib. Pt enroute to home and suddenly became confused. Pt has no memory of today otherwise neuro intact. PA, Mia and Dr. Tyrone Nine. LNW - [id number]

## 2019-12-22 NOTE — Procedures (Signed)
Patient Name: [name]  MRN: [id number]  Epilepsy Attending: Lora Havens  Referring Physician/Provider: Dr. Rosalin Hawking Date: 12/22/2019 Duration: 25.22 minutes  Patient history: 79 year old female with altered mental status.  EEG to evaluate for seizures.  Level of alertness: Awake, asleep  AEDs during EEG study: None  Technical aspects: This EEG study was done with scalp electrodes positioned according to the 10-20 International system of electrode placement. Electrical activity was acquired at a sampling rate of 500Hz  and reviewed with a high frequency filter of 70Hz  and a low frequency filter of 1Hz . EEG data were recorded continuously and digitally stored.   Description: The posterior dominant rhythm consists of 10-11 Hz activity of moderate voltage (25-35 uV) seen predominantly in posterior head regions, symmetric and reactive to eye opening and eye closing. Sleep was characterized by vertex waves, sleep spindles (12 to 14 Hz), maximal frontocentral region. Physiologic photic driving was seen during photic stimulation.  Hyperventilation was not performed.     IMPRESSION: This study is within normal limits. No seizures or epileptiform discharges were seen throughout the recording.  Willam Munford Barbra Sarks

## 2019-12-22 NOTE — Progress Notes (Signed)
STROKE TEAM PROGRESS NOTE   INTERVAL HISTORY No family at bedside.  Patient lying in bed, not in acute distress.  Patient cannot remember what happened this time and reason for current ED visit and admission.  However, she was able to tell me details about her past medical history history of MI, possible TGA in the past.  BP improved.  Not able to have MRI due to cochlear implant, not able to do CTA head and neck due to iodine allergy.  Patient told me that she had iodine allergy with diffuse hives.  Vitals:   12/22/19 0915 12/22/19 1130 12/22/19 1135 12/22/19 1200  BP: (!) 165/70 (!) 176/79  (!) 156/71  Pulse: 76 75  78  Resp: 19 20  20   Temp:      TempSrc:      SpO2: 96% 99%  93%  Weight:   101.1 kg   Height:   5\' 6"  (1.676 m)    CBC:  Recent Labs  Lab 12/22/19 0010 12/22/19 0720  WBC 7.6 7.5  NEUTROABS  --  DUPLICATE REQUEST   5.7  HGB 14.4 14.2  HCT 43.8 43.2  MCV 90.5 91.1  PLT 203 962   Basic Metabolic Panel:  Recent Labs  Lab 12/22/19 0010  NA 137  K 3.6  CL 103  CO2 24  GLUCOSE 181*  BUN 15  CREATININE 0.85  CALCIUM 9.6   Lipid Panel: No results for input(s): CHOL, TRIG, HDL, CHOLHDL, VLDL, LDLCALC in the last 168 hours. HgbA1c: No results for input(s): HGBA1C in the last 168 hours. Urine Drug Screen:  Recent Labs  Lab 12/22/19 1136  LABOPIA NONE DETECTED  COCAINSCRNUR NONE DETECTED  LABBENZ NONE DETECTED  AMPHETMU NONE DETECTED  THCU NONE DETECTED  LABBARB NONE DETECTED    Alcohol Level  Recent Labs  Lab 12/22/19 0720  ETH <10    IMAGING past 24 hours DG Chest 2 View  Result Date: 12/22/2019 CLINICAL DATA:  Palpitations EXAM: CHEST - 2 VIEW COMPARISON:  None. FINDINGS: The heart size and mediastinal contours are unchanged with mild cardiomegaly. Both lungs are clear. The visualized skeletal structures are unremarkable. IMPRESSION: No active cardiopulmonary disease. Electronically Signed   By: Prudencio Pair M.D.   On: 12/22/2019 00:33    ECHOCARDIOGRAM COMPLETE  Result Date: 12/22/2019    ECHOCARDIOGRAM REPORT   Patient Name:   ANGELIAH WISDOM Date of Exam: 12/22/2019 Medical Rec #:  [id number]         Height:       66.0 in Accession #:    [id number]        Weight:       222.8 lb Date of Birth:  Jan 15, 1941         BSA:          2.094 m Patient Age:    20 years          BP:           179/94 mmHg Patient Gender: F                 HR:           74 bpm. Exam Location:  Inpatient Procedure: 2D Echo Indications:    Stroke I163.9  History:        Patient has prior history of Echocardiogram examinations, most                 recent 10/05/2014. CAD and Previous Myocardial Infarction,  Ischemic Cardiomyopathy; Risk Factors:Hypertension.  Sonographer:    Mikki Santee RDCS (AE) Referring Phys: [place]  1. Left ventricular ejection fraction, by estimation, is 60 to 65%. The left ventricle has normal function. The left ventricle has no regional wall motion abnormalities. There is mild left ventricular hypertrophy. Left ventricular diastolic parameters are consistent with Grade I diastolic dysfunction (impaired relaxation).  2. Right ventricular systolic function is normal. The right ventricular size is normal.  3. Left atrial size was moderately dilated.  4. The mitral valve is normal in structure. No evidence of mitral valve regurgitation. No evidence of mitral stenosis.  5. The aortic valve is normal in structure. Aortic valve regurgitation is not visualized. No aortic stenosis is present.  6. The inferior vena cava is normal in size with greater than 50% respiratory variability, suggesting right atrial pressure of 3 mmHg. FINDINGS  Left Ventricle: Left ventricular ejection fraction, by estimation, is 60 to 65%. The left ventricle has normal function. The left ventricle has no regional wall motion abnormalities. The left ventricular internal cavity size was normal in size. There is  mild left ventricular  hypertrophy. Left ventricular diastolic parameters are consistent with Grade I diastolic dysfunction (impaired relaxation). Right Ventricle: The right ventricular size is normal. No increase in right ventricular wall thickness. Right ventricular systolic function is normal. Left Atrium: Left atrial size was moderately dilated. Right Atrium: Right atrial size was normal in size. Pericardium: There is no evidence of pericardial effusion. Mitral Valve: The mitral valve is normal in structure. No evidence of mitral valve regurgitation. No evidence of mitral valve stenosis. Tricuspid Valve: The tricuspid valve is normal in structure. Tricuspid valve regurgitation is not demonstrated. No evidence of tricuspid stenosis. Aortic Valve: The aortic valve is normal in structure. Aortic valve regurgitation is not visualized. No aortic stenosis is present. Pulmonic Valve: The pulmonic valve was normal in structure. Pulmonic valve regurgitation is not visualized. No evidence of pulmonic stenosis. Aorta: The aortic root is normal in size and structure. Venous: The inferior vena cava is normal in size with greater than 50% respiratory variability, suggesting right atrial pressure of 3 mmHg. IAS/Shunts: The interatrial septum was not well visualized.  LEFT VENTRICLE PLAX 2D LVIDd:         4.30 cm  Diastology LVIDs:         3.00 cm  LV e' medial:    4.46 cm/s LV PW:         1.30 cm  LV E/e' medial:  16.5 LV IVS:        1.20 cm  LV e' lateral:   6.32 cm/s LVOT diam:     2.00 cm  LV E/e' lateral: 11.6 LV SV:         59 LV SV Index:   28 LVOT Area:     3.14 cm  RIGHT VENTRICLE RV S prime:     17.00 cm/s TAPSE (M-mode): 1.4 cm LEFT ATRIUM             Index       RIGHT ATRIUM           Index LA diam:        4.50 cm 2.15 cm/m  RA Area:     11.10 cm LA Vol (A2C):   51.1 ml 24.41 ml/m RA Volume:   22.60 ml  10.79 ml/m LA Vol (A4C):   47.0 ml 22.45 ml/m LA Biplane Vol: 50.8 ml 24.26 ml/m  AORTIC VALVE  LVOT Vmax:   83.30 cm/s LVOT Vmean:   53.900 cm/s LVOT VTI:    0.187 m  AORTA Ao Root diam: 3.20 cm MITRAL VALVE MV Area (PHT): 2.50 cm     SHUNTS MV Decel Time: 303 msec     Systemic VTI:  0.19 m MV E velocity: 73.60 cm/s   Systemic Diam: 2.00 cm MV A velocity: 129.00 cm/s MV E/A ratio:  0.57 Jenkins Rouge MD Electronically signed by Jenkins Rouge MD Signature Date/Time: 12/22/2019/10:53:22 AM    Final    CT HEAD CODE STROKE WO CONTRAST  Result Date: 12/22/2019 CLINICAL DATA:  Code stroke.  Altered mental status and confusion EXAM: CT HEAD WITHOUT CONTRAST TECHNIQUE: Contiguous axial images were obtained from the base of the skull through the vertex without intravenous contrast. COMPARISON:  06/25/2014 FINDINGS: Brain: No evidence of acute infarction, hemorrhage, hydrocephalus, extra-axial collection or mass lesion/mass effect. Mild chronic white matter disease. Age normal brain volume Vascular: No hyperdense vessel (when allowing for streak artifact from the cochlear implant) or unexpected calcification. Skull: Normal. Negative for fracture or focal lesion. Sinuses/Orbits: No acute finding. Other: These results were communicated to Dr. Rory Percy at 7:03 amon 10/6/2021by text page via the [facility] messaging system. Right mastoidectomy for cochlear implantation. No unexpected finding. ASPECTS [facility] Stroke Program Early CT Score) Not scored with this history IMPRESSION: No acute finding. Electronically Signed   By: Monte Fantasia M.D.   On: 12/22/2019 07:05   VAS US CAROTID (at [facility] and [facility] only)  Result Date: 12/22/2019 Carotid Arterial Duplex Study Indications:       TIA. Risk Factors:      Hypertension, hyperlipidemia, coronary artery disease. Comparison Study:  no prior Performing Technologist: Abram Sander RVS  Examination Guidelines: A complete evaluation includes B-mode imaging, spectral Doppler, color Doppler, and power Doppler as needed of all accessible portions of each vessel. Bilateral testing is considered an integral part of a complete  examination. Limited examinations for reoccurring indications may be performed as noted.  Right Carotid Findings: +----------+--------+--------+--------+------------------+--------+             PSV cm/s EDV cm/s Stenosis Plaque Description Comments  +----------+--------+--------+--------+------------------+--------+  CCA Prox   96       10                heterogenous                 +----------+--------+--------+--------+------------------+--------+  CCA Distal 49       11                heterogenous                 +----------+--------+--------+--------+------------------+--------+  ICA Prox   60       13       1-39%    heterogenous                 +----------+--------+--------+--------+------------------+--------+  ICA Distal 86       21                                             +----------+--------+--------+--------+------------------+--------+  ECA        114      7                                              +----------+--------+--------+--------+------------------+--------+ +----------+--------+-------+--------+-------------------+  PSV cm/s EDV cms Describe Arm Pressure (mmHG)  +----------+--------+-------+--------+-------------------+  Subclavian 130                                            +----------+--------+-------+--------+-------------------+ +---------+--------+--+--------+--+---------+  Vertebral PSV cm/s 42 EDV cm/s 11 Antegrade  +---------+--------+--+--------+--+---------+  Left Carotid Findings: +----------+--------+--------+--------+------------------+--------+             PSV cm/s EDV cm/s Stenosis Plaque Description Comments  +----------+--------+--------+--------+------------------+--------+  CCA Prox   67       12                heterogenous                 +----------+--------+--------+--------+------------------+--------+  CCA Distal 70       15                heterogenous                 +----------+--------+--------+--------+------------------+--------+  ICA Prox   93        26       1-39%    heterogenous                 +----------+--------+--------+--------+------------------+--------+  ICA Distal 68       16                                             +----------+--------+--------+--------+------------------+--------+  ECA        135      11                                             +----------+--------+--------+--------+------------------+--------+ +----------+--------+--------+--------+-------------------+             PSV cm/s EDV cm/s Describe Arm Pressure (mmHG)  +----------+--------+--------+--------+-------------------+  Subclavian 178                                             +----------+--------+--------+--------+-------------------+ +---------+--------+--+--------+--+---------+  Vertebral PSV cm/s 47 EDV cm/s 11 Antegrade  +---------+--------+--+--------+--+---------+   Summary: Right Carotid: Velocities in the right ICA are consistent with a 1-39% stenosis. Left Carotid: Velocities in the left ICA are consistent with a 1-39% stenosis. Vertebrals: Bilateral vertebral arteries demonstrate antegrade flow. *See table(s) above for measurements and observations.     Preliminary    VAS [place] TRANSCRANIAL DOPPLER  Result Date: 12/22/2019  Transcranial Doppler Indications: TIA. Comparison Study: no prior Performing Technologist: Abram Sander RVS  Examination Guidelines: A complete evaluation includes B-mode imaging, spectral Doppler, color Doppler, and power Doppler as needed of all accessible portions of each vessel. Bilateral testing is considered an integral part of a complete examination. Limited examinations for reoccurring indications may be performed as noted.  +----------+-------------+----------+-----------+-------+  RIGHT TCD  Right VM (cm) Depth (cm) Pulsatility Comment  +----------+-------------+----------+-----------+-------+  MCA            38.00                   1.50              +----------+-------------+----------+-----------+-------+  ACA           -26.00                    1.20              +----------+-------------+----------+-----------+-------+  Term ICA       19.00                   1.25              +----------+-------------+----------+-----------+-------+  PCA            20.00                   1.07              +----------+-------------+----------+-----------+-------+  Opthalmic      30.00                   1.90              +----------+-------------+----------+-----------+-------+  ICA siphon     40.00                   1.29              +----------+-------------+----------+-----------+-------+  Vertebral     -20.00                   1.25              +----------+-------------+----------+-----------+-------+  +----------+------------+----------+-----------+-------+  LEFT TCD   Left VM (cm) Depth (cm) Pulsatility Comment  +----------+------------+----------+-----------+-------+  MCA           51.00                   1.21              +----------+------------+----------+-----------+-------+  ACA           -23.00                  1.11              +----------+------------+----------+-----------+-------+  Term ICA      41.00                   1.32              +----------+------------+----------+-----------+-------+  PCA           10.00                   1.46              +----------+------------+----------+-----------+-------+  Opthalmic     26.00                   1.92              +----------+------------+----------+-----------+-------+  ICA siphon    37.00                   1.40              +----------+------------+----------+-----------+-------+  Vertebral     -25.00                  1.36              +----------+------------+----------+-----------+-------+  +------------+-------+-------+               VM cm/s Comment  +------------+-------+-------+  Dist Basilar -31.00           +------------+-------+-------+  Preliminary     PHYSICAL EXAM  Temp:  [98 F (36.7 C)-98.8 F (37.1 C)] 98.3 F (36.8 C) (10/06 1716) Pulse Rate:  [65-150] 70 (10/06  1716) Resp:  [11-25] 20 (10/06 1716) BP: (124-194)/(57-108) 157/59 (10/06 1716) SpO2:  [90 %-100 %] 98 % (10/06 1716) Weight:  [101.1 kg] 101.1 kg (10/06 1135)  General - Well nourished, well developed, in no apparent distress.  Ophthalmologic - fundi not visualized due to noncooperation.  Cardiovascular - Regular rhythm and rate, not in A. fib.  Mental Status -  Level of arousal and orientation to time, place, and person were intact. Language including expression, naming, repetition, comprehension was assessed and found intact. Attention span and concentration were normal, able to spell world backwards with with 2 attempts Registration 3/3, delayed recall 0/3. Fund of Knowledge was assessed and was intact.  Cranial Nerves II - XII - II - Visual field intact OU. III, IV, VI - Extraocular movements intact. V - Facial sensation intact bilaterally. VII - Facial movement intact bilaterally. VIII - Hearing & vestibular intact bilaterally. X - Palate elevates symmetrically. XI - Chin turning & shoulder shrug intact bilaterally. XII - Tongue protrusion intact.  Motor Strength - The patients strength was normal in all extremities and pronator drift was absent.  Bulk was normal and fasciculations were absent.   Motor Tone - Muscle tone was assessed at the neck and appendages and was normal.  Reflexes - The patients reflexes were symmetrical in all extremities and she had no pathological reflexes.  Sensory - Light touch, temperature/pinprick were assessed and were symmetrical.    Coordination - The patient had normal movements in the hands with no ataxia or dysmetria.  Tremor was absent.  Gait and Station - deferred.   ASSESSMENT/PLAN Ms. [name] is a 79 y.o. female with history of MI, TGA, hypertension, fibroids status post hysterectomy, obesity w/ a cochlear implant who was seen in the ED earlier in the evening w/ new dx Aflutter started on rivaroxaban who represented  with confusion. Concern for recurrent TGA.  Stroke vs recurrent TGA vs. Hypertensive encephalopathy   Code Stroke CT head No acute abnormality.   Repeat CT 24h pending   TCD pending  Carotid Doppler  B ICA 1-39% stenosis, VAs antegrade   2D Echo EF 60-65%. No source of embolus. LA moderately dilated.  EEG neg sz  LDL pending   HgbA1c pending   VTE prophylaxis - Xarelto  Xarelto (rivaroxaban) daily prior to admission, now on ASA 81. If repeat CT no large infarct, will start Xarelto (rivaroxaban) daily and continue at d/c  Therapy recommendations:  pending   Disposition:  pending   Atrial Flutter, new dx  Xarelto just started day of admission for new dx AFlutter  Continue Xarelto (rivaroxaban) daily at discharge  Hypertensive urgency  Stable, high at times (190s)  Permissive hypertension (OK if < 220/120) but gradually normalize in 2-3 days w/ stroke dx  Long-term BP goal normotensive  Hyperlipidemia  Home meds:  crestor 10, resumed in hospital  LDL pending , goal < 70  Continue statin at discharge  Other Stroke Risk Factors  Advanced age  Former Cigarette smoker, quit 51 yrs ago  Obesity, Body mass index is 35.97 kg/m., recommend weight loss, diet and exercise as appropriate   Coronary artery disease, hx STEMI w/ PCI  Chronic systolic CHF  Other Active Problems  HOH w/ Cochlear implant  Hypothyroidism   Hospital day # 0  Rosalin Hawking,  MD PhD Stroke Neurology 12/22/2019 7:46 PM    To contact Stroke Continuity provider, please refer to [email or web address] After hours, contact General Neurology

## 2019-12-22 NOTE — ED Provider Notes (Signed)
[facility] EMERGENCY DEPARTMENT Provider Note   CSN: [id number] Arrival date & time: 12/22/19  2035  An emergency department physician performed an initial assessment on this suspected stroke patient at [facility].  History Chief Complaint  Patient presents with  . Code Stroke    [name] is a 79 y.o. female with a history of hearing loss status post cochlear implant on the right, hearing aid on the left, CAD status post inferoposterior STEMI in January 2015 treated with a BMS to the left circumflex, HL, HTN, IV dye allergy who presents the emergency department by POV for confusion.  The patient was seen in the ER by me earlier tonight after presenting with atrial flutter with RVR earlier tonight.  Reports that she was just prior to arrival she was sleeping and palpitations awoke her from sleep.  No chest pain, shortness of breath, headache, dizziness, lightheadedness, numbness, weakness, slurred speech, facial droop, or other complaints.  Patient spontaneously converted to sinus rhythm prior to my evaluation of the patient.  She was observed for several hours and had a a minimally elevated troponin, but normal CBC and CMP.  She was seen and evaluated by cardiology and was advised to increase her home metoprolol and start taking rivaroxaban today--which she has not yet initiated.  All questions were answered by me at discharge and patient was acting appropriately.  After the patient was discharged from the hospital, I received a call from the patient's sister who reported that the patient became acutely confused while she was driving her home from the ER. LKN 05:40.   When the patient return to the ER, she was able to state her name and the year.  She did not recall ever meeting me.  She did not remember being at the hospital.  She cannot remember what she had for breakfast or lunch yesterday.  She could not remember that she had a roommate living with her she is repeating  questions over and over.  Level 5 caveat secondary to altered mental status.  The history is provided by the patient and medical records. The history is limited by the condition of the patient. No language interpreter was used.       Past Medical History:  Diagnosis Date  . Acute inferolateral myocardial infarction ([place]) 03/24/2013  . Acute MI, inferoposterior wall, initial episode of care ([place]) 03/28/2013  . Acute myocardial infarction of other inferior wall, initial episode of care   . Amnesia, global, transient   . Arthritis    needs left knee replacement  . CAD (coronary artery disease)    a. 03/2013 Infpost STEMI/PCI: LM nl, LAD min irregs, LCX 100 (3.0x12 Vision BMS), RCA  mild ostial dzs, EF 55%.  . Essential hypertension, benign   . Fibroids    a. uterine - spotting noted since 03/11/2013.  Marland Kitchen GERD 11/19/2007   Qualifier: Diagnosis of  By: Henrene Pastor MD, Docia Chuck   . GERD (gastroesophageal reflux disease)   . Hearing loss    a. s/p cochlear implant on right, hearing aid on left.  Marland Kitchen Hernia of anterior abdominal wall   . History of cardiovascular stress test    Lexiscan Myoview 8/16:  EF 58%, inferolateral scar, no ischemia; Low Risk  . Hyperlipidemia   . Hypothyroidism   . Ischemic cardiomyopathy    Echo 7/16: Septal and posterior lateral HK, EF 45-50%, mild LAE  . IV Contrast Allergy   . Near syncope 06/26/2014  . Obesity   .  Old MI (myocardial infarction) 03/03/2014  . Retrograde amnesia 06/26/2014  . Rosacea   . Ventricular tachycardia ([place])    a. Immediate post pci/MI - no complications, on BB.    Patient Active Problem List   Diagnosis Date Noted  . Post-menopausal bleeding 08/17/2014  . Postmenopausal bleeding 08/16/2014  . Amnesia, global, transient   . Vaginal bleeding 06/26/2014  . Retrograde amnesia 06/26/2014  . Acute post-hemorrhagic anemia 06/26/2014  . Near syncope 06/26/2014  . Old MI (myocardial infarction) 03/03/2014  . DOE (dyspnea on exertion)  07/30/2013  . Edema 04/27/2013  . Acute MI, inferoposterior wall, initial episode of care ([place]) 03/28/2013  . CAD (coronary artery disease) 03/28/2013  . Hypothyroidism 03/28/2013  . Hyperlipidemia 03/28/2013  . IV Contrast Allergy   . Rosacea   . Obesity   . Ventricular tachycardia ([place])   . Acute inferolateral myocardial infarction ([place]) 03/24/2013  . Acute myocardial infarction of other inferior wall, initial episode of care   . Essential hypertension, benign   . GERD 11/19/2007    Past Surgical History:  Procedure Laterality Date  . CERVICAL POLYPECTOMY     2010  . CHOLECYSTECTOMY    . COCHLEAR IMPLANT    . LEFT HEART CATHETERIZATION WITH CORONARY ANGIOGRAM N/A 03/25/2013   Procedure: LEFT HEART CATHETERIZATION WITH CORONARY ANGIOGRAM;  Surgeon: Burnell Blanks, MD;  Location: [facility] CATH LAB;  Service: Cardiovascular;  Laterality: N/A;  . ROBOTIC ASSISTED TOTAL HYSTERECTOMY WITH BILATERAL SALPINGO OOPHERECTOMY Bilateral 08/16/2014   Procedure: ROBOTIC ASSISTED TOTAL HYSTERECTOMY WITH BILATERAL SALPINGO OOPHORECTOMY ;  Surgeon: Everitt Amber, MD;  Location: [facility] ORS;  Service: Gynecology;  Laterality: Bilateral;  . UPPER GASTROINTESTINAL ENDOSCOPY    . uterine polyp     had D and C done to remove the polyp     OB History   No obstetric history on file.     Family History  Problem Relation Age of Onset  . Heart failure Mother        died in her 5's.  . Cancer Mother   . Diabetes Mother   . Heart disease Mother   . Hypertension Mother   . Heart attack Mother   . Hyperlipidemia Sister   . Diabetes Sister   . Other Father        died in WWII  . Colon cancer Cousin   . Heart attack Maternal Grandmother   . Esophageal cancer Neg Hx   . Stomach cancer Neg Hx   . Stroke Neg Hx     Social History   Tobacco Use  . Smoking status: Former Smoker    Packs/day: 0.20    Years: 2.00    Pack years: 0.40    Types: Cigarettes    Quit date: 03/18/1968    Years since  quitting: 51.7  . Smokeless tobacco: Never Used  Substance Use Topics  . Alcohol use: No  . Drug use: No    Home Medications Prior to Admission medications   Medication Sig Start Date End Date Taking? Authorizing Provider  Calcium Carb-Cholecalciferol (CALCIUM + D3) 600-200 MG-UNIT TABS Take 1 tablet by mouth every morning.     [provider]  Cyanocobalamin (VITAMIN B 12 PO) Take 1,000 tablets by mouth daily.    [provider]  diclofenac sodium (VOLTAREN) 1 % GEL Apply 2 g topically 2 (two) times daily. Applies to knees    [provider]  furosemide (LASIX) 20 MG tablet Take 1 tablet (20 mg total) by  mouth daily. 05/10/19   Jettie Booze, MD  levothyroxine (SYNTHROID, LEVOTHROID) 100 MCG tablet Take 100 mcg by mouth daily before breakfast.    [provider]  lisinopril (ZESTRIL) 40 MG tablet TAKE 1 TABLET ONCE DAILY. 12/13/19   Jettie Booze, MD  metoprolol succinate (TOPROL-XL) 50 MG 24 hr tablet Take 1 tablet (50 mg total) by mouth daily. Take with or immediately following a meal. 10/14/19   Jettie Booze, MD  metroNIDAZOLE (METROGEL) 0.75 % gel Apply 1 application topically daily. Patient places on her cheeks and nose, only about once or twice a week depending on the climate.    [provider]  NITROSTAT 0.4 MG SL tablet DISSOLVE 1 TABLET UNDER TONGUE AS NEEDED FOR CHEST PAIN,MAY REPEAT IN5 MINUTES FOR 2 DOSES. 05/10/19   Jettie Booze, MD  pantoprazole (PROTONIX) 40 MG tablet TAKE 1 TABLET ONCE DAILY. 11/04/19   Jettie Booze, MD  potassium chloride SA (KLOR-CON) 20 MEQ tablet Take 1 tablet (20 mEq total) by mouth daily. 05/10/19   Jettie Booze, MD  rivaroxaban (XARELTO) 20 MG TABS tablet Take 1 tablet (20 mg total) by mouth daily with supper. 12/22/19   Nipp, Carriel T, MD  rosuvastatin (CRESTOR) 10 MG tablet TAKE 1 TABLET 4 DAYS EACH WEEK. 05/10/19   Jettie Booze, MD    Allergies    Bee  venom, Contrast media [iodinated diagnostic agents], and Iohexol  Review of Systems   Review of Systems  Unable to perform ROS: Mental status change  Psychiatric/Behavioral: Positive for confusion.    Physical Exam Updated Vital Signs BP (!) 193/89 (BP Location: Right Arm)   Pulse 85   Temp 98 F (36.7 C) (Oral)   Resp 16   SpO2 99%   Physical Exam Vitals and nursing note reviewed.  Constitutional:      General: She is not in acute distress.    Comments: Pleasantly confused.  HENT:     Head: Normocephalic.     Comments: Cochlear implant in the right ear.  Hearing aid in the left ear.  She is very hard of hearing.    Mouth/Throat:     Mouth: Mucous membranes are moist.  Eyes:     Extraocular Movements: Extraocular movements intact.     Conjunctiva/sclera: Conjunctivae normal.     Pupils: Pupils are equal, round, and reactive to light.  Cardiovascular:     Rate and Rhythm: Normal rate and regular rhythm.     Heart sounds: No murmur heard.  No friction rub. No gallop.   Pulmonary:     Effort: Pulmonary effort is normal. No respiratory distress.     Breath sounds: No stridor. No wheezing, rhonchi or rales.  Chest:     Chest wall: No tenderness.  Abdominal:     General: There is no distension.     Palpations: Abdomen is soft. There is no mass.     Tenderness: There is no abdominal tenderness. There is no right CVA tenderness, left CVA tenderness, guarding or rebound.     Hernia: No hernia is present.  Musculoskeletal:     Cervical back: Normal range of motion and neck supple.  Skin:    General: Skin is warm.     Capillary Refill: Capillary refill takes less than 2 seconds.     Findings: No rash.  Neurological:     Mental Status: She is alert.     Comments: Patient is oriented to name.  She knows  the year.  GCS 14 for confusion.  She does not recall ever meeting me despite me providing care for her in the ER earlier tonight.  She has repetitive questioning.  She has  reminded staff numerous times that she has a cochlear implant in the right ear.  She is repeatedly asking why she is here.  5 of 5 strength against resistance of the bilateral upper and lower extremities.  Sensation is intact and equal.  No pronator drift.  Finger-to-nose is intact bilaterally.  Gait is not ataxic.  Psychiatric:        Behavior: Behavior normal.     ED Results / Procedures / Treatments   Labs (all labs ordered are listed, but only abnormal results are displayed) Labs Reviewed  ETHANOL  PROTIME-INR  APTT  CBC  DIFFERENTIAL  COMPREHENSIVE METABOLIC PANEL  RAPID URINE DRUG SCREEN, HOSP PERFORMED  URINALYSIS, ROUTINE W REFLEX MICROSCOPIC  I-STAT CHEM 8, ED    EKG None  Radiology DG Chest 2 View  Result Date: 12/22/2019 CLINICAL DATA:  Palpitations EXAM: CHEST - 2 VIEW COMPARISON:  None. FINDINGS: The heart size and mediastinal contours are unchanged with mild cardiomegaly. Both lungs are clear. The visualized skeletal structures are unremarkable. IMPRESSION: No active cardiopulmonary disease. Electronically Signed   By: Prudencio Pair M.D.   On: 12/22/2019 00:33    Procedures .Critical Care Performed by: Joanne Gavel, PA-C Authorized by: Joanne Gavel, PA-C   Critical care provider statement:    Critical care time (minutes):  45   Critical care time was exclusive of:  Separately billable procedures and treating other patients and teaching time   Critical care was necessary to treat or prevent imminent or life-threatening deterioration of the following conditions:  CNS failure or compromise   Critical care was time spent personally by me on the following activities:  Ordering and performing treatments and interventions, ordering and review of laboratory studies, ordering and review of radiographic studies, pulse oximetry, re-evaluation of patient's condition, review of old charts, obtaining history from patient or surrogate, examination of patient, discussions  with consultants and development of treatment plan with patient or surrogate   I assumed direction of critical care for this patient from another provider in my specialty: no     (including critical care time)  Medications Ordered in ED Medications - No data to display  ED Course  I have reviewed the triage vital signs and the nursing notes.  Pertinent labs & imaging results that were available during my care of the patient were reviewed by me and considered in my medical decision making (see chart for details).    MDM Rules/Calculators/A&P                          79 year old female with a history of hearing loss status post cochlear implant on the right, hearing aid on the left, CAD status post inferoposterior STEMI in January 2015 treated with a BMS to the left circumflex, HL, HTN, IV dye allergy who presents to the emergency department with confusion. LKN 5:40.   The patient was seen by me in the ER earlier tonight after she had an episode of atrial flutter with RVR that spontaneously converted in the ER.  Work-up was reassuring.  She was cleared by cardiology and was discharged home.  Please see my previous note for further work-up and medical decision making.  In route to her home, she became acutely confused, and  her sister called the emergency department and spoke with me directly.  I advised her to bring the patient back to the emergency department for reevaluation.  Dr. Tyrone Nine, attending physician, was made aware.  On arrival to the ER, she is hypertensive.  Blood pressure 193/89.  Vital signs are otherwise unremarkable.  Patient is confused with repetitive questioning.  She does not recall ever meeting me or my attending, Dr. Tyrone Nine after we provided care of her several hours in the emergency department earlier tonight.  She has no other focal neurologic deficits on exam.  The patient was evaluated by myself and Dr. Tyrone Nine in triage after the patient returned to the ER.    Given  concern for acute neurologic change following a an episode of atrial flutter with RVR when the patient is not anticoagulated, neurology was consulted.  Dr. Rory Percy recommends calling code stroke.  Code stroke called by me at 06:44.   Patient previously had CBC and CMP during her previous visit, which were unremarkable.  She did have a slight uptrend in her troponin 9->24, which cardiology felt to be representative of supply demand mismatch as she was having no chest pain and postconversion EKG shows no ST changes.  CT head with no acute findings.  Chest x-ray is unremarkable.  The patient was seen by Dr. Rory Percy who is differential diagnosis includes CVA in the setting of newly diagnosed a flutter, TIA, or transient global amnesia.  Please see his note for further work-up and medical recommendations.  Patient has a CTA of the head and neck that is pending after premedication due to an IV contrast allergy.  Consult to the hospitalist team and Dr. Cruzita Lederer will accept the patient for admission. The patient appears reasonably stabilized for admission considering the current resources, flow, and capabilities available in the ED at this time, and I doubt any other [facility] requiring further screening and/or treatment in the ED prior to admission.    Final Clinical Impression(s) / ED Diagnoses Final diagnoses:  None    Rx / DC Orders ED Discharge Orders    None       Otis Portal A, PA-C 12/22/19 [place], [place], DO 12/23/19 1201

## 2019-12-22 NOTE — ED Notes (Signed)
Repeat trop @ 1440

## 2019-12-22 NOTE — ED Notes (Signed)
Cardiologist at bedside to see patient.

## 2019-12-22 NOTE — Progress Notes (Signed)
Carotid duplex & TCD have been completed.   Preliminary results in CV Proc.   [place] Sander 12/22/2019 10:20 AM

## 2019-12-23 ENCOUNTER — Observation Stay (HOSPITAL_COMMUNITY): Payer: Medicare Other

## 2019-12-23 ENCOUNTER — Other Ambulatory Visit: Payer: Self-pay | Admitting: Neurology

## 2019-12-23 DIAGNOSIS — G459 Transient cerebral ischemic attack, unspecified: Secondary | ICD-10-CM

## 2019-12-23 DIAGNOSIS — Z8673 Personal history of transient ischemic attack (TIA), and cerebral infarction without residual deficits: Secondary | ICD-10-CM | POA: Diagnosis not present

## 2019-12-23 DIAGNOSIS — G454 Transient global amnesia: Secondary | ICD-10-CM

## 2019-12-23 DIAGNOSIS — I160 Hypertensive urgency: Secondary | ICD-10-CM

## 2019-12-23 DIAGNOSIS — R9082 White matter disease, unspecified: Secondary | ICD-10-CM | POA: Diagnosis not present

## 2019-12-23 LAB — COMPREHENSIVE METABOLIC PANEL
ALT: 12 U/L (ref 0–44)
AST: 24 U/L (ref 15–41)
Albumin: 3.4 g/dL — ABNORMAL LOW (ref 3.5–5.0)
Alkaline Phosphatase: 58 U/L (ref 38–126)
Anion gap: 11 (ref 5–15)
BUN: 16 mg/dL (ref 8–23)
CO2: 22 mmol/L (ref 22–32)
Calcium: 9.4 mg/dL (ref 8.9–10.3)
Chloride: 106 mmol/L (ref 98–111)
Creatinine, Ser: 0.75 mg/dL (ref 0.44–1.00)
GFR calc non Af Amer: >60 mL/min (ref >60)
Glucose, Bld: 125 mg/dL — ABNORMAL HIGH (ref 70–99)
Potassium: 3.7 mmol/L (ref 3.5–5.1)
Sodium: 139 mmol/L (ref 135–145)
Total Bilirubin: 0.6 mg/dL (ref 0.3–1.2)
Total Protein: 6.5 g/dL (ref 6.5–8.1)

## 2019-12-23 LAB — LIPID PANEL
Cholesterol: 129 mg/dL (ref 0–200)
HDL: 44 mg/dL (ref >40)
Total CHOL/HDL Ratio: 2.9 RATIO
Triglycerides: 69 mg/dL (ref <150)
VLDL: 14 mg/dL (ref 0–40)

## 2019-12-23 LAB — CBC
HCT: 42.3 % (ref 36.0–46.0)
Hemoglobin: 13.3 g/dL (ref 12.0–15.0)
MCH: 29.8 pg (ref 26.0–34.0)
MCHC: 31.4 g/dL (ref 30.0–36.0)
MCV: 94.8 fL (ref 80.0–100.0)
Platelets: 173 10*3/uL (ref 150–400)
RBC: 4.46 MIL/uL (ref 3.87–5.11)
RDW: 12.8 % (ref 11.5–15.5)
WBC: 9.2 10*3/uL (ref 4.0–10.5)
nRBC: 0 % (ref 0.0–0.2)

## 2019-12-23 LAB — MAGNESIUM: Magnesium: 2 mg/dL (ref 1.7–2.4)

## 2019-12-23 LAB — HEMOGLOBIN A1C
Hgb A1c MFr Bld: 5.5 % (ref 4.8–5.6)
Mean Plasma Glucose: 111.15 mg/dL

## 2019-12-23 LAB — PHOSPHORUS: Phosphorus: 2.3 mg/dL — ABNORMAL LOW (ref 2.5–4.6)

## 2019-12-23 MED ORDER — ROSUVASTATIN CALCIUM 20 MG PO TABS
20.0000 mg | ORAL_TABLET | Freq: Every day | ORAL | 0 refills | Status: DC
Start: 2019-12-24 — End: 2020-01-03

## 2019-12-23 MED ORDER — INFLUENZA VAC A&B SA ADJ QUAD 0.5 ML IM PRSY
0.5000 mL | PREFILLED_SYRINGE | INTRAMUSCULAR | Status: AC | PRN
  Administered 2019-12-23: 0.5 mL via INTRAMUSCULAR
  Filled 2019-12-23: qty 0.5

## 2019-12-23 MED ORDER — ROSUVASTATIN CALCIUM 20 MG PO TABS: 20.0000 mg | ORAL_TABLET | Freq: Every day | ORAL | Status: DC

## 2019-12-23 MED ORDER — RIVAROXABAN 20 MG PO TABS
20.0000 mg | ORAL_TABLET | Freq: Every day | ORAL | Status: DC
  Administered 2019-12-23: 20 mg via ORAL
  Filled 2019-12-23: qty 1

## 2019-12-23 MED ORDER — K PHOS MONO-SOD PHOS DI & MONO 155-852-130 MG PO TABS
500.0000 mg | ORAL_TABLET | Freq: Two times a day (BID) | ORAL | Status: DC
  Administered 2019-12-23: 500 mg via ORAL
  Filled 2019-12-23 (×2): qty 2

## 2019-12-23 MED ORDER — INFLUENZA VAC A&B SA ADJ QUAD 0.5 ML IM PRSY: 0.5000 mL | PREFILLED_SYRINGE | INTRAMUSCULAR | Status: DC

## 2019-12-23 NOTE — Evaluation (Signed)
Speech Language Pathology Evaluation Patient Details Name: [name] MRN: [id number] DOB: [birth year] Today's Date: 12/23/2019 Time: 4098-1191 SLP Time Calculation (min) (ACUTE ONLY): 20 min  Problem List:  Patient Active Problem List   Diagnosis Date Noted  . TIA (transient ischemic attack) 12/22/2019  . Post-menopausal bleeding 08/17/2014  . Postmenopausal bleeding 08/16/2014  . Amnesia, global, transient   . Vaginal bleeding 06/26/2014  . Retrograde amnesia 06/26/2014  . Acute post-hemorrhagic anemia 06/26/2014  . Near syncope 06/26/2014  . Old MI (myocardial infarction) 03/03/2014  . DOE (dyspnea on exertion) 07/30/2013  . Edema 04/27/2013  . Acute MI, inferoposterior wall, initial episode of care ([place]) 03/28/2013  . CAD (coronary artery disease) 03/28/2013  . Hypothyroidism 03/28/2013  . Hyperlipidemia 03/28/2013  . IV Contrast Allergy   . Rosacea   . Obesity   . Ventricular tachycardia ([place])   . Acute inferolateral myocardial infarction ([place]) 03/24/2013  . Acute myocardial infarction of other inferior wall, initial episode of care   . Essential hypertension, benign   . GERD 11/19/2007   Past Medical History:  Past Medical History:  Diagnosis Date  . Acute inferolateral myocardial infarction ([place]) 03/24/2013  . Acute MI, inferoposterior wall, initial episode of care ([place]) 03/28/2013  . Acute myocardial infarction of other inferior wall, initial episode of care   . Amnesia, global, transient   . Arthritis    needs left knee replacement  . CAD (coronary artery disease)    a. 03/2013 Infpost STEMI/PCI: LM nl, LAD min irregs, LCX 100 (3.0x12 Vision BMS), RCA  mild ostial dzs, EF 55%.  . Essential hypertension, benign   . Fibroids    a. uterine - spotting noted since 03/11/2013.  Marland Kitchen GERD 11/19/2007   Qualifier: Diagnosis of  By: Henrene Pastor MD, Docia Chuck   . GERD (gastroesophageal reflux disease)   . Hearing loss    a. s/p cochlear implant on right, hearing aid on left.  Marland Kitchen  Hernia of anterior abdominal wall   . History of cardiovascular stress test    Lexiscan Myoview 8/16:  EF 58%, inferolateral scar, no ischemia; Low Risk  . Hyperlipidemia   . Hypothyroidism   . Ischemic cardiomyopathy    Echo 7/16: Septal and posterior lateral HK, EF 45-50%, mild LAE  . IV Contrast Allergy   . Near syncope 06/26/2014  . Obesity   . Old MI (myocardial infarction) 03/03/2014  . Retrograde amnesia 06/26/2014  . Rosacea   . Ventricular tachycardia ([place])    a. Immediate post pci/MI - no complications, on BB.   Past Surgical History:  Past Surgical History:  Procedure Laterality Date  . CERVICAL POLYPECTOMY     2010  . CHOLECYSTECTOMY    . COCHLEAR IMPLANT    . LEFT HEART CATHETERIZATION WITH CORONARY ANGIOGRAM N/A 03/25/2013   Procedure: LEFT HEART CATHETERIZATION WITH CORONARY ANGIOGRAM;  Surgeon: Burnell Blanks, MD;  Location[facility] LAB;  Service: Cardiovascular;  Laterality: N/A;  . ROBOTIC ASSISTED TOTAL HYSTERECTOMY WITH BILATERAL SALPINGO OOPHERECTOMY Bilateral 08/16/2014   Procedure: ROBOTIC ASSISTED TOTAL HYSTERECTOMY WITH BILATERAL SALPINGO OOPHORECTOMY ;  Surgeon: Everitt Amber, MD;  Location: [facility] ORS;  Service: Gynecology;  Laterality: Bilateral;  . UPPER GASTROINTESTINAL ENDOSCOPY    . uterine polyp     had D and C done to remove the polyp   HPI:  79 y.o. female with medical history significant of coronary artery disease with prior STEMI with stent in 2015, hypertension, cochlear implant, hyperlipidemia, hypothyroidism, chronic systolic CHF with  recovered EF, presents to the hospital with chief complaint of confusion. Pt admitted with dx of TIA. CT negative.   Assessment / Plan / Recommendation Clinical Impression  Pt appears to be at her cognitive baseline, and describes having a few similar episodes of what she calls "medical amnesia," in which she has moments that she does not remember that are always associated with medical events. She had minimal  errors on the SLUMS (26/30), but also shows great awareness and acknowledges that her hearing may have played a role in her performance. No SLP f/u appears to be needed at this time.    SLP Assessment  SLP Recommendation/Assessment: Patient does not need any further Speech Lanaguage Pathology Services SLP Visit Diagnosis: Cognitive communication deficit (R41.841)    Follow Up Recommendations  None    Frequency and Duration           SLP Evaluation Cognition  Overall Cognitive Status: Within Functional Limits for tasks assessed       Comprehension  Auditory Comprehension Overall Auditory Comprehension: Appears within functional limits for tasks assessed    Expression Expression Primary Mode of Expression: Verbal Verbal Expression Overall Verbal Expression: Appears within functional limits for tasks assessed   Oral / Motor  Motor Speech Overall Motor Speech: Appears within functional limits for tasks assessed   GO                    Osie Bond., M.A. [facility] Pager [phone] Office [phone]  12/23/2019, 11:54 AM

## 2019-12-23 NOTE — Care Management Obs Status (Signed)
[place] NOTIFICATION   Patient Details  Name: [name] MRN: [id number] Date of Birth: [birth year]   Medicare Observation Status Notification Given:  Yes    Pollie Friar, RN 12/23/2019, 11:02 AM

## 2019-12-23 NOTE — Evaluation (Signed)
Physical Therapy Evaluation Patient Details Name: [name] MRN: [id number] DOB: 1940-12-29 Today's Date: 12/23/2019   History of Present Illness  79 y.o. female with medical history significant of coronary artery disease with prior STEMI with stent in 2015, hypertension, cochlear implant, hyperlipidemia, hypothyroidism, chronic systolic CHF with recovered EF, presents to the hospital with chief complaint of confusion. Pt admitted with dx of TIA. CT negative.    Clinical Impression  PT eval complete. Pt is mod I bed mobility and transfers. Supervision provided for ambulation 200' with QC. Steady gait. Strength intact. Pt is a baseline for functional mobility. No further PT intervention indicated. PT signing off.     Follow Up Recommendations No PT follow up    Equipment Recommendations  None recommended by PT    Recommendations for Other Services       Precautions / Restrictions Precautions Precautions: None      Mobility  Bed Mobility Overal bed mobility: Modified Independent                Transfers Overall transfer level: Modified independent Equipment used: Ambulation equipment used                Ambulation/Gait Ambulation/Gait assistance: Supervision Gait Distance (Feet): 200 Feet Assistive device: Quad cane Gait Pattern/deviations: Step-through pattern;Decreased stride length Gait velocity: mildly decreased Gait velocity interpretation: >2.62 ft/sec, indicative of community ambulatory General Gait Details: steady gait with QC  Stairs            Wheelchair Mobility    Modified Rankin (Stroke Patients Only) Modified Rankin (Stroke Patients Only) Pre-Morbid Rankin Score: No significant disability Modified Rankin: Slight disability     Balance Overall balance assessment: Mild deficits observed, not formally tested                                           Pertinent Vitals/Pain Pain Assessment: No/denies pain     Home Living Family/patient expects to be discharged to:: Private residence Living Arrangements: Non-relatives/Friends Available Help at Discharge: Friend(s);Available PRN/intermittently Type of Home: House Home Access: Stairs to enter Entrance Stairs-Rails: None Entrance Stairs-Number of Steps: 1 Home Layout: Two level;Able to live on main level with bedroom/bathroom Home Equipment: Kasandra Knudsen - quad      Prior Function Level of Independence: Independent with assistive device(s)         Comments: QC for amb in community. Drives.     Hand Dominance        Extremity/Trunk Assessment   Upper Extremity Assessment Upper Extremity Assessment: Defer to OT evaluation    Lower Extremity Assessment Lower Extremity Assessment: Overall WFL for tasks assessed (symmetrical)    Cervical / Trunk Assessment Cervical / Trunk Assessment: Normal  Communication   Communication: HOH (cochlear implant R ear)  Cognition Arousal/Alertness: Awake/alert Behavior During Therapy: WFL for tasks assessed/performed Overall Cognitive Status: Within Functional Limits for tasks assessed                                 General Comments: no recall of events that brought her to the hospital      General Comments General comments (skin integrity, edema, etc.): VSS    Exercises     Assessment/Plan    PT Assessment Patent does not need any further PT services  PT Problem List  PT Treatment Interventions      PT Goals (Current goals can be found in the Care Plan section)  Acute Rehab PT Goals Patient Stated Goal: home PT Goal Formulation: All assessment and education complete, DC therapy    Frequency     Barriers to discharge        Co-evaluation               AM-PAC PT "6 Clicks" Mobility  Outcome Measure Help needed turning from your back to your side while in a flat bed without using bedrails?: None Help needed moving from lying on your back to sitting  on the side of a flat bed without using bedrails?: None Help needed moving to and from a bed to a chair (including a wheelchair)?: None Help needed standing up from a chair using your arms (e.g., wheelchair or bedside chair)?: None Help needed to walk in hospital room?: None Help needed climbing 3-5 steps with a railing? : A Little 6 Click Score: 23    End of Session Equipment Utilized During Treatment: Gait belt Activity Tolerance: Patient tolerated treatment well Patient left: in chair;with call bell/phone within reach Nurse Communication: Mobility status PT Visit Diagnosis: Difficulty in walking, not elsewhere classified (R26.2)    Time: 3016-0109 PT Time Calculation (min) (ACUTE ONLY): 21 min   Charges:   PT Evaluation $PT Eval Low Complexity: 1 Low          Lorrin Goodell, PT  Office # [phone] Pager [phone]   Lorriane Shire 12/23/2019, 9:14 AM

## 2019-12-23 NOTE — Evaluation (Signed)
Occupational Therapy Evaluation Patient Details Name: [name] MRN: [id number] DOB: [birth year] Today's Date: 12/23/2019    History of Present Illness 79 y.o. female with medical history significant of coronary artery disease with prior STEMI with stent in 2015, hypertension, cochlear implant, hyperlipidemia, hypothyroidism, chronic systolic CHF with recovered EF, presents to the hospital with chief complaint of confusion. Pt admitted with dx of TIA. CT negative.   Clinical Impression   PTA pt living with friend, and functioning at mod I level with QC in community. At time of eval, pt demonstrated ability to complete all ADL and mobility without external assist. No unilateral strength deficits noted. No safety concerns were visible during assessment. OT administered Short blessed test, pt with perfect score. She states she has always had poor memory surrounding traumatic events and that this is nothing new. NO further OT needs identified, OT will sign off. Thank you for this consult.     Follow Up Recommendations  No OT follow up    Equipment Recommendations  None recommended by OT    Recommendations for Other Services       Precautions / Restrictions Precautions Precautions: None Restrictions Weight Bearing Restrictions: No      Mobility Bed Mobility Overal bed mobility: Modified Independent                Transfers Overall transfer level: Modified independent Equipment used: Quad cane                  Balance Overall balance assessment: Mild deficits observed, not formally tested                                         ADL either performed or assessed with clinical judgement   ADL                                         General ADL Comments: Pt demonstrated ability to complete ADL at mod I level with QC. No safety concerns or LOB noted     Vision Patient Visual Report: No change from baseline        Perception     Praxis      Pertinent Vitals/Pain Pain Assessment: No/denies pain     Hand Dominance     Extremity/Trunk Assessment Upper Extremity Assessment Upper Extremity Assessment: Overall WFL for tasks assessed   Lower Extremity Assessment Lower Extremity Assessment: Overall WFL for tasks assessed   Cervical / Trunk Assessment Cervical / Trunk Assessment: Normal   Communication Communication Communication: HOH (cochlear impant at baseline)   Cognition Arousal/Alertness: Awake/alert Behavior During Therapy: WFL for tasks assessed/performed Overall Cognitive Status: Within Functional Limits for tasks assessed                                 General Comments: Pt reports she does not remember "bad events" that happen in her life. States that this is not the first time, but when a traumatic even happens she has no memory of it   General Comments  VSS    Exercises     Shoulder Instructions      Home Living Family/patient expects to be discharged to:: Private residence Living Arrangements: Non-relatives/Friends Available Help at Discharge:  Friend(s);Available PRN/intermittently Type of Home: House Home Access: Stairs to enter [employer] of Steps: 1 Entrance Stairs-Rails: None Home Layout: Two level;Able to live on main level with bedroom/bathroom Alternate Level Stairs-Number of Steps: flight   Bathroom Shower/Tub: Occupational psychologist: Standard     Home Equipment: Cane - quad          Prior Functioning/Environment Level of Independence: Independent with assistive device(s)        Comments: QC for amb in community. Drives.        OT Problem List: Decreased knowledge of use of DME or AE;Decreased knowledge of precautions;Decreased activity tolerance      OT Treatment/Interventions:      OT Goals(Current goals can be found in the care plan section) Acute Rehab OT Goals Patient Stated Goal: home OT Goal  Formulation: All assessment and education complete, DC therapy  OT Frequency:     Barriers to D/C:            Co-evaluation              AM-PAC OT "6 Clicks" Daily Activity     Outcome Measure Help from another person eating meals?: None Help from another person taking care of personal grooming?: None Help from another person toileting, which includes using toliet, bedpan, or urinal?: None Help from another person bathing (including washing, rinsing, drying)?: None Help from another person to put on and taking off regular upper body clothing?: None Help from another person to put on and taking off regular lower body clothing?: None 6 Click Score: 24   End of Session Nurse Communication: Mobility status  Activity Tolerance: Patient tolerated treatment well Patient left: in bed;with call bell/phone within reach  OT Visit Diagnosis: Other symptoms and signs involving the nervous system (J03.159)                Time: 4585-9292 OT Time Calculation (min): 11 min Charges:  OT Evaluation $OT Eval Low Complexity: 1 Low  Zenovia Jarred, MSOT, OTR/L Acute Rehabilitation Services [facility] Office Number: [phone] Pager: [phone]  Zenovia Jarred 12/23/2019, 11:13 AM

## 2019-12-23 NOTE — Progress Notes (Signed)
ANTICOAGULATION CONSULT NOTE - Initial Consult  Pharmacy Consult for Xarelto Indication: atrial fibrillation  Allergies  Allergen Reactions  . Bee Venom Itching and Swelling  . Contrast Media [Iodinated Diagnostic Agents] Hives    dye  . Fish Allergy Anaphylaxis    Shell fish  . Iohexol Hives    Patient Measurements: Height: 5\' 6"  (167.6 cm) Weight: 101.1 kg (222 lb 14.2 oz) IBW/kg (Calculated) : 59.3  Vital Signs: Temp: 97.6 F (36.4 C) (10/07 1211) Temp Source: Oral (10/07 1211) BP: 143/56 (10/07 1211) Pulse Rate: 62 (10/07 1211)  Labs: Recent Labs    12/22/19 0010 12/22/19 0010 12/22/19 0209 12/22/19 0720 12/22/19 0720 12/22/19 1239 12/22/19 1305 12/22/19 1440 12/23/19 0823  HGB 14.4   < >  --  14.2   < >  --  14.6  --  13.3  HCT 43.8   < >  --  43.2  --   --  43.0  --  42.3  PLT 203  --   --  165  --   --   --   --  173  APTT  --   --   --  SPECIMEN CLOTTED  --  28  --   --   --   LABPROT  --   --   --  SPECIMEN CLOTTED  --  13.7  --   --   --   INR  --   --   --  SPECIMEN CLOTTED  --  1.1  --   --   --   CREATININE 0.85  --   --   --   --   --  0.50  --  0.75  TROPONINIHS 9   < > 24*  --   --  39*  --  38*  --    < > = values in this interval not displayed.    Estimated Creatinine Clearance: 70.7 mL/min (by C-G formula based on SCr of 0.75 mg/dL).   Medical History: Past Medical History:  Diagnosis Date  . Acute inferolateral myocardial infarction ([place]) 03/24/2013  . Acute MI, inferoposterior wall, initial episode of care ([place]) 03/28/2013  . Acute myocardial infarction of other inferior wall, initial episode of care   . Amnesia, global, transient   . Arthritis    needs left knee replacement  . CAD (coronary artery disease)    a. 03/2013 Infpost STEMI/PCI: LM nl, LAD min irregs, LCX 100 (3.0x12 Vision BMS), RCA  mild ostial dzs, EF 55%.  . Essential hypertension, benign   . Fibroids    a. uterine - spotting noted since 03/11/2013.  Marland Kitchen GERD 11/19/2007    Qualifier: Diagnosis of  By: Henrene Pastor MD, Docia Chuck   . GERD (gastroesophageal reflux disease)   . Hearing loss    a. s/p cochlear implant on right, hearing aid on left.  Marland Kitchen Hernia of anterior abdominal wall   . History of cardiovascular stress test    Lexiscan Myoview 8/16:  EF 58%, inferolateral scar, no ischemia; Low Risk  . Hyperlipidemia   . Hypothyroidism   . Ischemic cardiomyopathy    Echo 7/16: Septal and posterior lateral HK, EF 45-50%, mild LAE  . IV Contrast Allergy   . Near syncope 06/26/2014  . Obesity   . Old MI (myocardial infarction) 03/03/2014  . Retrograde amnesia 06/26/2014  . Rosacea   . Ventricular tachycardia ([place])    a. Immediate post pci/MI - no complications, on BB.    Assessment: 79  yr old woman admitted 12/22/19 with TIA; no abnormality on head CT. Pt was found to be in a flutter in ED on 10/6 and was started on Xarelto and discharged home. En route to her home, pt developed confusion and was brought back to the ED. Pharmacy is now consulted for Xarelto dosing prior to discharge (per RN, pt may be discharged today).  CBC WNL; TBW CrCl ~100 ml/min  Goal of Therapy:  Prevention of stroke secondary to atrial fibrillation Monitor platelets by anticoagulation protocol: Yes   Plan:  Xarelto 20 mg PO daily with evening meal, starting today Monitor CBC Monitor for signs/symptoms of bleeding  Gillermina Hu, PharmD, BCPS,FCCP Clinical Pharmacist 12/23/2019,3:14 PM

## 2019-12-23 NOTE — TOC Transition Note (Signed)
Transition of Care [facility]) - CM/SW Discharge Note   Patient Details  Name: [name] MRN: [id number] Date of Birth: 07-Jun-1940  [facility] CM/SW Contact:  Pollie Friar, RN Phone Number: 12/23/2019, 11:07 AM   Clinical Narrative:    Pt discharging home with self care. Pt lives with a friend that can provide supervision.  Pt has: 4 prong cane, walker, shower chair.  Pt does her own transportation outside the hospital. She has transport home today. Pt denies issues with home medications.   Final next level of care: Home/Self Care Barriers to Discharge: No Barriers Identified   Patient Goals and CMS Choice        Discharge Placement                       Discharge Plan and Services                                     Social Determinants of Health (SDOH) Interventions     Readmission Risk Interventions No flowsheet data found.

## 2019-12-23 NOTE — Plan of Care (Signed)
  Problem: Education: Goal: Knowledge of General Education information will improve Description: Including pain rating scale, medication(s)/side effects and non-pharmacologic comfort measures Outcome: Adequate for Discharge   Problem: Health Behavior/Discharge Planning: Goal: Ability to manage health-related needs will improve Outcome: Adequate for Discharge   Problem: Clinical Measurements: Goal: Ability to maintain clinical measurements within normal limits will improve Outcome: Adequate for Discharge Goal: Will remain free from infection Outcome: Adequate for Discharge Goal: Diagnostic test results will improve Outcome: Adequate for Discharge Goal: Respiratory complications will improve Outcome: Adequate for Discharge Goal: Cardiovascular complication will be avoided Outcome: Adequate for Discharge   Problem: Activity: Goal: Risk for activity intolerance will decrease Outcome: Adequate for Discharge   Problem: Nutrition: Goal: Adequate nutrition will be maintained Outcome: Adequate for Discharge   Problem: Coping: Goal: Level of anxiety will decrease Outcome: Adequate for Discharge   Problem: Elimination: Goal: Will not experience complications related to bowel motility Outcome: Adequate for Discharge Goal: Will not experience complications related to urinary retention Outcome: Adequate for Discharge   Problem: Pain Managment: Goal: General experience of comfort will improve Outcome: Adequate for Discharge   Problem: Safety: Goal: Ability to remain free from injury will improve Outcome: Adequate for Discharge   Problem: Skin Integrity: Goal: Risk for impaired skin integrity will decrease Outcome: Adequate for Discharge   Problem: Education: Goal: Knowledge of disease or condition will improve Outcome: Adequate for Discharge Goal: Knowledge of secondary prevention will improve Outcome: Adequate for Discharge Goal: Knowledge of patient specific risk factors  addressed and post discharge goals established will improve Outcome: Adequate for Discharge   Problem: Coping: Goal: Will verbalize positive feelings about self Outcome: Adequate for Discharge Goal: Will identify appropriate support needs Outcome: Adequate for Discharge   Problem: Health Behavior/Discharge Planning: Goal: Ability to manage health-related needs will improve Outcome: Adequate for Discharge   Problem: Self-Care: Goal: Ability to participate in self-care as condition permits will improve Outcome: Adequate for Discharge Goal: Verbalization of feelings and concerns over difficulty with self-care will improve Outcome: Adequate for Discharge Goal: Ability to communicate needs accurately will improve Outcome: Adequate for Discharge   Problem: Intracerebral Hemorrhage Tissue Perfusion: Goal: Complications of Intracerebral Hemorrhage will be minimized Outcome: Adequate for Discharge   Problem: Ischemic Stroke/TIA Tissue Perfusion: Goal: Complications of ischemic stroke/TIA will be minimized Outcome: Adequate for Discharge   Problem: Spontaneous Subarachnoid Hemorrhage Tissue Perfusion: Goal: Complications of Spontaneous Subarachnoid Hemorrhage will be minimized Outcome: Adequate for Discharge

## 2019-12-23 NOTE — Discharge Summary (Signed)
Physician Discharge Summary  [name] XAJ:[id number] DOB: 09/15/1940 DOA: 12/22/2019  PCP: Burnard Bunting, MD  Admit date: 12/22/2019 Discharge date: 12/23/2019  Admitted From: Home Disposition: Home  Recommendations for Outpatient Follow-up:  1. Follow up with PCP in 1-2 weeks 2. Follow up with Neurology within 1-2 weeks 3. Follow-up with cardiology Dr. Irish Lack within 1 to 2 weeks 4. Please obtain CMP/CBC, Mag, Phos in one week 5. Please follow up on the following pending results:  Home Health: No Equipment/Devices: None    Discharge Condition: Stable  CODE STATUS: FULL CODE  Diet recommendation: Heart Healthy Diet   Brief/Interim Summary: HPI per Dr. Marzetta Board on 12/22/19 [name] is a 79 y.o. female with medical history significant of coronary artery disease with prior STEMI with stent in 2015, hypertension, cochlear implant, hyperlipidemia, hypothyroidism, chronic systolic CHF with recovered EF, presents to the hospital with chief complaint of confusion.  Presented to the ED last night with palpitations, she was found to be in a flutter with RVR.  Cardiology was consulted, recommending anticoagulation.  Her a flutter with RVR self resolved into the ER and converted back to sinus rhythm.  Due to stability she was discharged home, she left the ED around 5 AM, however on the way home patient suddenly became confused and she was brought back.  She was treated as code stroke and neurology was consulted.  CT scan of the brain was unremarkable.  She cannot have an MRI due to cochlear implant.  On my evaluation patient has no recollection of her ED visit last night and is intermittently confused.  Sister is at bedside and provides most of the story.  Patient sister tells me that she has a history of transient global amnesia after an episode of GI bleed several years back.  Currently, patient denies any weakness, denies any chest pain, denies any palpitations, no shortness  of breath.  She has no headache, nausea or vomiting.  She denies any numbing or tingling in her arms or legs.  ED Course: In the emergency room she is afebrile, hypertensive with blood pressure in the 160s-190s, heart rate is in the 70s and she is satting 99% on room air.  Blood work shows high-sensitivity troponin 9 >> 24, followed by cardiology to be demand in the setting of a flutter.  Blood work is otherwise unremarkable.  CT of the brain unremarkable.  Neurology was consulted and we are asked to admit  **Interim History She had a stroke work-up done and neurology felt that this was secondary to transglobal amnesia however they cannot effectively rule out TIA.  They are recommending resuming Xarelto and increasing statin to 20 mg daily. PT OT recommended no follow-up.  She is stable to be discharged and she feels back to baseline.  She will need to see her PCP and neurology in 1 to 2 weeks.  Discharge Diagnoses:  Active Problems:   TIA (transient ischemic attack)  Acute confusional state-differential is CVA in the setting of newly diagnosed a flutter, TIA, transient global amnesia; likely was transient global amnesia however cannot rule out TIA -Neurology consulted, will admit for stroke work-up, 2D echo, lipid panel, A1c.  -D/w Dr. Erlinda Hong, hold CT angio, repeat the CT scan within 24 hours given inability to use MRI, he will obtain a transcranial Doppler and this was done and read by Dr. Leonie Man -Repeat head CT done and was no hemorrhage or visible infarct -PT OT SLP recommending home without follow-up -Allow permissive  hypertension up to 016 systolic per neurology and will resume antihypertensives at discharge -EEG done the study was within normal limits with no seizures or epileptiform discharges seen throughout the recording -Neurology now recommending Xarelto and increasing statin dose to 20 mg daily; neurology feels that this is likely transglobal amnesia however they cannot effectively rule  out TIA and recommending Xarelto at discharge and statin  Newly diagnosed a flutter, paroxysmal-currently she is in sinus rhythm, continue metoprolol, resume Xarelto today  Essential hypertension-initially held  Lasix and lisinopril to allow blood pressure to run on the higher side, use metoprolol for rate control; resume home medications at discharge  Coronary artery disease with history of STEMI/PCI-currently no chest pain, continue home medications, beta-blocker, statin.  She had mild troponin elevation felt to be due to demand, will check 1 more to ensure stability given change in clinical status and it was stable as it went from 39 down to 38  Chronic systolic CHF-appears euvolemic on admission, most recent 2D echo done in 2016 showed EF 45-60% with septal and lateral hypokinesis.  This was followed by stress test which actually showed EF 55-65%, scar from prior MI, and  was a low risk study -Repeat echocardiogram done this visit showed "Left ventricular ejection fraction, by estimation, is 60 to 65%. The left ventricle has normal function. The left ventricle has no regional wall motion abnormalities. There is mild left ventricular hypertrophy. Left ventricular diastolic parameters are consistent with Grade I diastolic dysfunction (impaired relaxation). Right ventricular systolic function is normal. The right ventricular size is normal." -Follow-up with primary cardiologist in outpatient setting  Hypothyroidism -resume home medications with Synthroid, check TSH given new onset a flutter and was 0.391  Hyperlipidemia-continue statin this dosing is increased to 20 mg p.o. daily by neurology  Hypophosphatemia -Patient's phosphorus level was 2.3.  Replete with p.o. K-Phos Neutral 500 g x 2 doses-continue monitor and replete as necessary  Obesity -Complicates overall prognosis and care -Estimated body mass index is 35.97 kg/m as calculated from the following:   Height as of this  encounter: 5\' 6"  (1.676 m).   Weight as of this encounter: 101.1 kg. -Weight Loss and Dietary Counseling given  Discharge Instructions  Discharge Instructions    Ambulatory referral to Neurology   Complete by: As directed    An appointment is requested in approximately: 1-2 weeks for TGA vs. TIA   Call MD for:  difficulty breathing, headache or visual disturbances   Complete by: As directed    Call MD for:  extreme fatigue   Complete by: As directed    Call MD for:  hives   Complete by: As directed    Call MD for:  persistant dizziness or light-headedness   Complete by: As directed    Call MD for:  persistant nausea and vomiting   Complete by: As directed    Call MD for:  redness, tenderness, or signs of infection (pain, swelling, redness, odor or green/yellow discharge around incision site)   Complete by: As directed    Call MD for:  severe uncontrolled pain   Complete by: As directed    Call MD for:  temperature >100.4   Complete by: As directed    Diet - low sodium heart healthy   Complete by: As directed    Discharge instructions   Complete by: As directed    You were cared for by a hospitalist during your hospital stay. If you have any questions about your discharge medications  or the care you received while you were in the hospital after you are discharged, you can call the unit and ask to speak with the hospitalist on call if the hospitalist that took care of you is not available. Once you are discharged, your primary care physician will handle any further medical issues. Please note that NO REFILLS for any discharge medications will be authorized once you are discharged, as it is imperative that you return to your primary care physician (or establish a relationship with a primary care physician if you do not have one) for your aftercare needs so that they can reassess your need for medications and monitor your lab values.  Follow up with PCP, Neurology, and Cardiology. Take  all medications as prescribed. If symptoms change or worsen please return to the ED for evaluation   Increase activity slowly   Complete by: As directed      Allergies as of 12/23/2019      Reactions   Bee Venom Itching, Swelling   Contrast Media [iodinated Diagnostic Agents] Hives   dye   Fish Allergy Anaphylaxis   Shell fish   Iohexol Hives      Medication List    TAKE these medications   Calcium + D3 600-200 MG-UNIT Tabs Take 1 tablet by mouth every morning.   diclofenac sodium 1 % Gel Commonly known as: VOLTAREN Apply 2 g topically 2 (two) times daily. Applies to knees   furosemide 20 MG tablet Commonly known as: LASIX Take 1 tablet (20 mg total) by mouth daily.   levothyroxine 100 MCG tablet Commonly known as: SYNTHROID Take 100 mcg by mouth daily before breakfast.   lisinopril 40 MG tablet Commonly known as: ZESTRIL TAKE 1 TABLET ONCE DAILY.   metoprolol succinate 50 MG 24 hr tablet Commonly known as: TOPROL-XL Take 1 tablet (50 mg total) by mouth daily. Take with or immediately following a meal.   metroNIDAZOLE 0.75 % gel Commonly known as: METROGEL Apply 1 application topically daily. Patient places on her cheeks and nose, only about once or twice a week depending on the climate.   Nitrostat 0.4 MG SL tablet Generic drug: nitroGLYCERIN DISSOLVE 1 TABLET UNDER TONGUE AS NEEDED FOR CHEST PAIN,MAY REPEAT IN5 MINUTES FOR 2 DOSES.   pantoprazole 40 MG tablet Commonly known as: PROTONIX TAKE 1 TABLET ONCE DAILY.   potassium chloride SA 20 MEQ tablet Commonly known as: KLOR-CON Take 1 tablet (20 mEq total) by mouth daily.   rivaroxaban 20 MG Tabs tablet Commonly known as: XARELTO Take 1 tablet (20 mg total) by mouth daily with supper.   rosuvastatin 20 MG tablet Commonly known as: CRESTOR Take 1 tablet (20 mg total) by mouth daily. Start taking on: December 24, 2019 What changed:   medication strength  See the new instructions.   VITAMIN B 12  PO Take 1,000 tablets by mouth daily.       Follow-up Information    Burnard Bunting, MD. Call.   Specialty: Internal Medicine Why: Follow up within 1-2 weeks Contact information: [place] [address] [phone]        Jettie Booze, MD .   Specialties: Cardiology, Radiology, Interventional Cardiology Contact information: [address] [place] [address] [phone]              Allergies  Allergen Reactions  . Bee Venom Itching and Swelling  . Contrast Media [Iodinated Diagnostic Agents] Hives    dye  . Fish Allergy  Anaphylaxis    Shell fish  . Iohexol Hives   Consultations:  Neurology  Procedures/Studies: DG Chest 2 View  Result Date: 12/22/2019 CLINICAL DATA:  Palpitations EXAM: CHEST - 2 VIEW COMPARISON:  None. FINDINGS: The heart size and mediastinal contours are unchanged with mild cardiomegaly. Both lungs are clear. The visualized skeletal structures are unremarkable. IMPRESSION: No active cardiopulmonary disease. Electronically Signed   By: Prudencio Pair M.D.   On: 12/22/2019 00:33   CT HEAD WO CONTRAST  Result Date: 12/23/2019 CLINICAL DATA:  Stroke follow-up EXAM: CT HEAD WITHOUT CONTRAST TECHNIQUE: Contiguous axial images were obtained from the base of the skull through the vertex without intravenous contrast. COMPARISON:  Yesterday FINDINGS: Brain: No evidence of acute infarction, hemorrhage, hydrocephalus, extra-axial collection or mass lesion/mass effect. Mild patchy white matter low-density that is non progressed. Vascular: No hyperdense vessel or unexpected calcification. Skull: Normal. Negative for fracture or focal lesion. Sinuses/Orbits: Cochlear implant on the right with unavoidable streak artifact. No complicating features. IMPRESSION: Stable head CT.  No hemorrhage or visible infarct. Electronically Signed   By: Monte Fantasia M.D.   On: 12/23/2019 05:37   EEG adult  Result Date:  12/22/2019 Lora Havens, MD     12/22/2019  1:05 PM Patient Name: [name] MRN: [id number] Epilepsy Attending: Lora Havens Referring Physician/Provider: Dr. Rosalin Hawking Date: 12/22/2019 Duration: 25.22 minutes Patient history: 79 year old female with altered mental status.  EEG to evaluate for seizures. Level of alertness: Awake, asleep AEDs during EEG study: None Technical aspects: This EEG study was done with scalp electrodes positioned according to the 10-20 International system of electrode placement. Electrical activity was acquired at a sampling rate of 500Hz  and reviewed with a high frequency filter of 70Hz  and a low frequency filter of 1Hz . EEG data were recorded continuously and digitally stored. Description: The posterior dominant rhythm consists of 10-11 Hz activity of moderate voltage (25-35 uV) seen predominantly in posterior head regions, symmetric and reactive to eye opening and eye closing. Sleep was characterized by vertex waves, sleep spindles (12 to 14 Hz), maximal frontocentral region. Physiologic photic driving was seen during photic stimulation.  Hyperventilation was not performed.   IMPRESSION: This study is within normal limits. No seizures or epileptiform discharges were seen throughout the recording. Lora Havens   ECHOCARDIOGRAM COMPLETE  Result Date: 12/22/2019    ECHOCARDIOGRAM REPORT   Patient Name:   [name] Date of Exam: 12/22/2019 Medical Rec #:  [id number]         Height:       66.0 in Accession #:    [id number]        Weight:       222.8 lb Date of Birth:  1941/03/06         BSA:          2.094 m Patient Age:    79 years          BP:           179/94 mmHg Patient Gender: F                 HR:           74 bpm. Exam Location:  Inpatient Procedure: 2D Echo Indications:    Stroke I163.9  History:        Patient has prior history of Echocardiogram examinations, most                 recent 10/05/2014.  CAD and Previous Myocardial Infarction,                  Ischemic Cardiomyopathy; Risk Factors:Hypertension.  Sonographer:    Mikki Santee RDCS (AE) Referring Phys: [place]  1. Left ventricular ejection fraction, by estimation, is 60 to 65%. The left ventricle has normal function. The left ventricle has no regional wall motion abnormalities. There is mild left ventricular hypertrophy. Left ventricular diastolic parameters are consistent with Grade I diastolic dysfunction (impaired relaxation).  2. Right ventricular systolic function is normal. The right ventricular size is normal.  3. Left atrial size was moderately dilated.  4. The mitral valve is normal in structure. No evidence of mitral valve regurgitation. No evidence of mitral stenosis.  5. The aortic valve is normal in structure. Aortic valve regurgitation is not visualized. No aortic stenosis is present.  6. The inferior vena cava is normal in size with greater than 50% respiratory variability, suggesting right atrial pressure of 3 mmHg. FINDINGS  Left Ventricle: Left ventricular ejection fraction, by estimation, is 60 to 65%. The left ventricle has normal function. The left ventricle has no regional wall motion abnormalities. The left ventricular internal cavity size was normal in size. There is  mild left ventricular hypertrophy. Left ventricular diastolic parameters are consistent with Grade I diastolic dysfunction (impaired relaxation). Right Ventricle: The right ventricular size is normal. No increase in right ventricular wall thickness. Right ventricular systolic function is normal. Left Atrium: Left atrial size was moderately dilated. Right Atrium: Right atrial size was normal in size. Pericardium: There is no evidence of pericardial effusion. Mitral Valve: The mitral valve is normal in structure. No evidence of mitral valve regurgitation. No evidence of mitral valve stenosis. Tricuspid Valve: The tricuspid valve is normal in structure. Tricuspid valve regurgitation is not  demonstrated. No evidence of tricuspid stenosis. Aortic Valve: The aortic valve is normal in structure. Aortic valve regurgitation is not visualized. No aortic stenosis is present. Pulmonic Valve: The pulmonic valve was normal in structure. Pulmonic valve regurgitation is not visualized. No evidence of pulmonic stenosis. Aorta: The aortic root is normal in size and structure. Venous: The inferior vena cava is normal in size with greater than 50% respiratory variability, suggesting right atrial pressure of 3 mmHg. IAS/Shunts: The interatrial septum was not well visualized.  LEFT VENTRICLE PLAX 2D LVIDd:         4.30 cm  Diastology LVIDs:         3.00 cm  LV e' medial:    4.46 cm/s LV PW:         1.30 cm  LV E/e' medial:  16.5 LV IVS:        1.20 cm  LV e' lateral:   6.32 cm/s LVOT diam:     2.00 cm  LV E/e' lateral: 11.6 LV SV:         59 LV SV Index:   28 LVOT Area:     3.14 cm  RIGHT VENTRICLE RV S prime:     17.00 cm/s TAPSE (M-mode): 1.4 cm LEFT ATRIUM             Index       RIGHT ATRIUM           Index LA diam:        4.50 cm 2.15 cm/m  RA Area:     11.10 cm LA Vol (A2C):   51.1 ml 24.41 ml/m RA Volume:   22.60 ml  10.79 ml/m LA Vol (A4C):   47.0 ml 22.45 ml/m LA Biplane Vol: 50.8 ml 24.26 ml/m  AORTIC VALVE LVOT Vmax:   83.30 cm/s LVOT Vmean:  53.900 cm/s LVOT VTI:    0.187 m  AORTA Ao Root diam: 3.20 cm MITRAL VALVE MV Area (PHT): 2.50 cm     SHUNTS MV Decel Time: 303 msec     Systemic VTI:  0.19 m MV E velocity: 73.60 cm/s   Systemic Diam: 2.00 cm MV A velocity: 129.00 cm/s MV E/A ratio:  0.57 Jenkins Rouge MD Electronically signed by Jenkins Rouge MD Signature Date/Time: 12/22/2019/10:53:22 AM    Final    CT HEAD CODE STROKE WO CONTRAST  Result Date: 12/22/2019 CLINICAL DATA:  Code stroke.  Altered mental status and confusion EXAM: CT HEAD WITHOUT CONTRAST TECHNIQUE: Contiguous axial images were obtained from the base of the skull through the vertex without intravenous contrast. COMPARISON:   06/25/2014 FINDINGS: Brain: No evidence of acute infarction, hemorrhage, hydrocephalus, extra-axial collection or mass lesion/mass effect. Mild chronic white matter disease. Age normal brain volume Vascular: No hyperdense vessel (when allowing for streak artifact from the cochlear implant) or unexpected calcification. Skull: Normal. Negative for fracture or focal lesion. Sinuses/Orbits: No acute finding. Other: These results were communicated to Dr. Rory Percy at 7:03 amon 10/6/2021by text page via the [facility] messaging system. Right mastoidectomy for cochlear implantation. No unexpected finding. ASPECTS [facility] Stroke Program Early CT Score) Not scored with this history IMPRESSION: No acute finding. Electronically Signed   By: Monte Fantasia M.D.   On: 12/22/2019 07:05   VAS US CAROTID (at [facility] and [facility] only)  Result Date: 12/23/2019 Carotid Arterial Duplex Study Indications:       TIA. Risk Factors:      Hypertension, hyperlipidemia, coronary artery disease. Comparison Study:  no prior Performing Technologist: Abram Sander RVS  Examination Guidelines: A complete evaluation includes B-mode imaging, spectral Doppler, color Doppler, and power Doppler as needed of all accessible portions of each vessel. Bilateral testing is considered an integral part of a complete examination. Limited examinations for reoccurring indications may be performed as noted.  Right Carotid Findings: +----------+--------+--------+--------+------------------+--------+           PSV cm/sEDV cm/sStenosisPlaque DescriptionComments +----------+--------+--------+--------+------------------+--------+ CCA Prox  96      10              heterogenous               +----------+--------+--------+--------+------------------+--------+ CCA Distal49      11              heterogenous               +----------+--------+--------+--------+------------------+--------+ ICA Prox  60      13      1-39%   heterogenous                +----------+--------+--------+--------+------------------+--------+ ICA Distal86      21                                         +----------+--------+--------+--------+------------------+--------+ ECA       114     7                                          +----------+--------+--------+--------+------------------+--------+ +----------+--------+-------+--------+-------------------+  PSV cm/sEDV cmsDescribeArm Pressure (mmHG) +----------+--------+-------+--------+-------------------+ Subclavian130                                        +----------+--------+-------+--------+-------------------+ +---------+--------+--+--------+--+---------+ VertebralPSV cm/s42EDV cm/s11Antegrade +---------+--------+--+--------+--+---------+  Left Carotid Findings: +----------+--------+--------+--------+------------------+--------+           PSV cm/sEDV cm/sStenosisPlaque DescriptionComments +----------+--------+--------+--------+------------------+--------+ CCA Prox  67      12              heterogenous               +----------+--------+--------+--------+------------------+--------+ CCA Distal70      15              heterogenous               +----------+--------+--------+--------+------------------+--------+ ICA Prox  93      26      1-39%   heterogenous               +----------+--------+--------+--------+------------------+--------+ ICA Distal68      16                                         +----------+--------+--------+--------+------------------+--------+ ECA       135     11                                         +----------+--------+--------+--------+------------------+--------+ +----------+--------+--------+--------+-------------------+           PSV cm/sEDV cm/sDescribeArm Pressure (mmHG) +----------+--------+--------+--------+-------------------+ [id number]                                          +----------+--------+--------+--------+-------------------+ +---------+--------+--+--------+--+---------+ VertebralPSV cm/s47EDV cm/s11Antegrade +---------+--------+--+--------+--+---------+   Summary: Right Carotid: Velocities in the right ICA are consistent with a 1-39% stenosis. Left Carotid: Velocities in the left ICA are consistent with a 1-39% stenosis. Vertebrals: Bilateral vertebral arteries demonstrate antegrade flow. *See table(s) above for measurements and observations.  Electronically signed by Antony Contras MD on 12/23/2019 at 8:34:03 AM.    Final    VAS [place] TRANSCRANIAL DOPPLER  Result Date: 12/23/2019  Transcranial Doppler Indications: TIA. Comparison Study: no prior Performing Technologist: Abram Sander RVS  Examination Guidelines: A complete evaluation includes B-mode imaging, spectral Doppler, color Doppler, and power Doppler as needed of all accessible portions of each vessel. Bilateral testing is considered an integral part of a complete examination. Limited examinations for reoccurring indications may be performed as noted.  +----------+-------------+----------+-----------+-------+ RIGHT TCD Right VM (cm)Depth (cm)PulsatilityComment +----------+-------------+----------+-----------+-------+ MCA           38.00                 1.50            +----------+-------------+----------+-----------+-------+ ACA          -26.00                 1.20            +----------+-------------+----------+-----------+-------+ Term ICA      19.00                 1.25            +----------+-------------+----------+-----------+-------+  PCA           20.00                 1.07            +----------+-------------+----------+-----------+-------+ Opthalmic     30.00                 1.90            +----------+-------------+----------+-----------+-------+ ICA siphon    40.00                 1.29            +----------+-------------+----------+-----------+-------+  Vertebral    -20.00                 1.25            +----------+-------------+----------+-----------+-------+  +----------+------------+----------+-----------+-------+ LEFT TCD  Left VM (cm)Depth (cm)PulsatilityComment +----------+------------+----------+-----------+-------+ MCA          51.00                 1.21            +----------+------------+----------+-----------+-------+ ACA          -23.00                1.11            +----------+------------+----------+-----------+-------+ Term ICA     41.00                 1.32            +----------+------------+----------+-----------+-------+ PCA          10.00                 1.46            +----------+------------+----------+-----------+-------+ Opthalmic    26.00                 1.92            +----------+------------+----------+-----------+-------+ ICA siphon   37.00                 1.40            +----------+------------+----------+-----------+-------+ Vertebral    -25.00                1.36            +----------+------------+----------+-----------+-------+  +------------+-------+-------+             VM cm/sComment +------------+-------+-------+ Dist Basilar-31.00         +------------+-------+-------+ Summary:  *See table(s) above for TCD measurements and observations.  Diagnosing physician: Antony Contras MD Electronically signed by Antony Contras MD on 12/23/2019 at 8:37:02 AM.    Final     Subjective: Seen and examined at bedside and she felt back to baseline but did not know how she got here.  States that this is happened to her before when she had a traumatic event with a hysterectomy that she started hemorrhaging in the past.  Denies any chest pain, lightheadedness or dizziness.  No other concerns or complaints this time and PT OT recommending no follow-up.  Neurology is cleared her for discharge and have recommended Xarelto and statin at discharge.  She will be discharged home and need to  follow-up with PCP, cardiology as well as neurology in the outpatient setting.  Discharge Exam: Vitals:   12/23/19 1211 12/23/19 1606  BP: (!) 143/56 127/65  Pulse: 62 66  Resp:  20 16  Temp: 97.6 F (36.4 C) 98.1 F (36.7 C)  SpO2: 98% 97%   Vitals:   12/23/19 0401 12/23/19 0738 12/23/19 1211 12/23/19 1606  BP: (!) 118/51 (!) 149/60 (!) 143/56 127/65  Pulse: 63 (!) 56 62 66  Resp: 16 16 20 16   Temp: 98.2 F (36.8 C) (!) 97.5 F (36.4 C) 97.6 F (36.4 C) 98.1 F (36.7 C)  TempSrc: Oral Oral Oral Oral  SpO2: 97% 99% 98% 97%  Weight:      Height:       General: Pt is alert, awake, not in acute distress; she is hard of hearing due to her cochlear implants Cardiovascular: RRR, S1/S2 +, no rubs, no gallops Respiratory: Diminished bilaterally, no wheezing, no rhonchi Abdominal: Soft, NT, distended secondary body habitus, bowel sounds + Extremities: Minimal edema, no cyanosis  The results of significant diagnostics from this hospitalization (including imaging, microbiology, ancillary and laboratory) are listed below for reference.    Microbiology: Recent Results (from the past 240 hour(s))  Respiratory Panel by RT PCR (Flu A&B, Covid) - Nasopharyngeal Swab     Status: None   Collection Time: 12/22/19  7:22 AM   Specimen: Nasopharyngeal Swab  Result Value Ref Range Status   SARS Coronavirus 2 by RT PCR NEGATIVE NEGATIVE Final    Comment: (NOTE) SARS-CoV-2 target nucleic acids are NOT DETECTED.  The SARS-CoV-2 RNA is generally detectable in upper respiratoy specimens during the acute phase of infection. The lowest concentration of SARS-CoV-2 viral copies this assay can detect is 131 copies/mL. A negative result does not preclude SARS-Cov-2 infection and should not be used as the sole basis for treatment or other patient management decisions. A negative result may occur with  improper specimen collection/handling, submission of specimen other than nasopharyngeal swab,  presence of viral mutation(s) within the areas targeted by this assay, and inadequate number of viral copies (<131 copies/mL). A negative result must be combined with clinical observations, patient history, and epidemiological information. The expected result is Negative.  Fact Sheet for Patients:  PinkCheek.be  Fact Sheet for Healthcare Providers:  GravelBags.it  This test is no t yet approved or cleared by the [place] FDA and  has been authorized for detection and/or diagnosis of SARS-CoV-2 by FDA under an Emergency Use Authorization (EUA). This EUA will remain  in effect (meaning this test can be used) for the duration of the COVID-19 declaration under Section 564(b)(1) of the Act, 21 U.S.C. section 360bbb-3(b)(1), unless the authorization is terminated or revoked sooner.     Influenza A by PCR NEGATIVE NEGATIVE Final   Influenza B by PCR NEGATIVE NEGATIVE Final    Comment: (NOTE) The Xpert Xpress SARS-CoV-2/FLU/RSV assay is intended as an aid in  the diagnosis of influenza from Nasopharyngeal swab specimens and  should not be used as a sole basis for treatment. Nasal washings and  aspirates are unacceptable for Xpert Xpress SARS-CoV-2/FLU/RSV  testing.  Fact Sheet for Patients: PinkCheek.be  Fact Sheet for Healthcare Providers: GravelBags.it  This test is not yet approved or cleared by the [place] FDA and  has been authorized for detection and/or diagnosis of SARS-CoV-2 by  FDA under an Emergency Use Authorization (EUA). This EUA will remain  in effect (meaning this test can be used) for the duration of the  Covid-19 declaration under Section 564(b)(1) of the Act, 21  U.S.C. section 360bbb-3(b)(1), unless the authorization is  terminated or revoked. Performed at [facility], [place]  [address], [place], [place] [address]     Labs: BNP  (last 3 results) No results for input(s): BNP in the last 8760 hours. Basic Metabolic Panel: Recent Labs  Lab 12/22/19 0010 12/22/19 1305 12/23/19 0823  NA 137 141 139  K 3.6 3.8 3.7  CL 103 105 106  CO2 24  --  22  GLUCOSE 181* 150* 125*  BUN 15 10 16   CREATININE 0.85 0.50 0.75  CALCIUM 9.6  --  9.4  MG  --   --  2.0  PHOS  --   --  2.3*   Liver Function Tests: Recent Labs  Lab 12/23/19 0823  AST 24  ALT 12  ALKPHOS 58  BILITOT 0.6  PROT 6.5  ALBUMIN 3.4*   No results for input(s): LIPASE, AMYLASE in the last 168 hours. No results for input(s): AMMONIA in the last 168 hours. CBC: Recent Labs  Lab 12/22/19 0010 12/22/19 0720 12/22/19 1305 12/23/19 0823  WBC 7.6 7.5  --  9.2  NEUTROABS  --  DUPLICATE REQUEST  5.7  --   --   HGB 14.4 14.2 14.6 13.3  HCT 43.8 43.2 43.0 42.3  MCV 90.5 91.1  --  94.8  PLT 203 165  --  173   Cardiac Enzymes: No results for input(s): CKTOTAL, CKMB, CKMBINDEX, TROPONINI in the last 168 hours. BNP: Invalid input(s): POCBNP CBG: No results for input(s): GLUCAP in the last 168 hours. D-Dimer No results for input(s): DDIMER in the last 72 hours. Hgb A1c Recent Labs    12/23/19 0823  HGBA1C 5.5   Lipid Profile Recent Labs    12/23/19 0823  CHOL 129  HDL 44  LDLCALC NOT CALCULATED  TRIG 69  CHOLHDL 2.9   Thyroid function studies Recent Labs    12/22/19 1239  TSH 0.391   Anemia work up No results for input(s): VITAMINB12, FOLATE, FERRITIN, TIBC, IRON, RETICCTPCT in the last 72 hours. Urinalysis    Component Value Date/Time   COLORURINE STRAW (A) 12/22/2019 1136   APPEARANCEUR CLEAR 12/22/2019 1136   LABSPEC 1.006 12/22/2019 1136   PHURINE 7.0 12/22/2019 1136   GLUCOSEU NEGATIVE 12/22/2019 1136   HGBUR NEGATIVE 12/22/2019 1136   BILIRUBINUR NEGATIVE 12/22/2019 1136   KETONESUR NEGATIVE 12/22/2019 1136   PROTEINUR NEGATIVE 12/22/2019 1136   UROBILINOGEN 0.2 08/17/2014 0919   NITRITE NEGATIVE 12/22/2019 1136    LEUKOCYTESUR NEGATIVE 12/22/2019 1136   Sepsis Labs Invalid input(s): PROCALCITONIN,  WBC,  LACTICIDVEN Microbiology Recent Results (from the past 240 hour(s))  Respiratory Panel by RT PCR (Flu A&B, Covid) - Nasopharyngeal Swab     Status: None   Collection Time: 12/22/19  7:22 AM   Specimen: Nasopharyngeal Swab  Result Value Ref Range Status   SARS Coronavirus 2 by RT PCR NEGATIVE NEGATIVE Final    Comment: (NOTE) SARS-CoV-2 target nucleic acids are NOT DETECTED.  The SARS-CoV-2 RNA is generally detectable in upper respiratoy specimens during the acute phase of infection. The lowest concentration of SARS-CoV-2 viral copies this assay can detect is 131 copies/mL. A negative result does not preclude SARS-Cov-2 infection and should not be used as the sole basis for treatment or other patient management decisions. A negative result may occur with  improper specimen collection/handling, submission of specimen other than nasopharyngeal swab, presence of viral mutation(s) within the areas targeted by this assay, and inadequate number of viral copies (<131 copies/mL). A negative result must be combined with clinical observations, patient history, and epidemiological information. The expected result  is Negative.  Fact Sheet for Patients:  PinkCheek.be  Fact Sheet for Healthcare Providers:  GravelBags.it  This test is no t yet approved or cleared by the [place] FDA and  has been authorized for detection and/or diagnosis of SARS-CoV-2 by FDA under an Emergency Use Authorization (EUA). This EUA will remain  in effect (meaning this test can be used) for the duration of the COVID-19 declaration under Section 564(b)(1) of the Act, 21 U.S.C. section 360bbb-3(b)(1), unless the authorization is terminated or revoked sooner.     Influenza A by PCR NEGATIVE NEGATIVE Final   Influenza B by PCR NEGATIVE NEGATIVE Final     Comment: (NOTE) The Xpert Xpress SARS-CoV-2/FLU/RSV assay is intended as an aid in  the diagnosis of influenza from Nasopharyngeal swab specimens and  should not be used as a sole basis for treatment. Nasal washings and  aspirates are unacceptable for Xpert Xpress SARS-CoV-2/FLU/RSV  testing.  Fact Sheet for Patients: PinkCheek.be  Fact Sheet for Healthcare Providers: GravelBags.it  This test is not yet approved or cleared by the [place] FDA and  has been authorized for detection and/or diagnosis of SARS-CoV-2 by  FDA under an Emergency Use Authorization (EUA). This EUA will remain  in effect (meaning this test can be used) for the duration of the  Covid-19 declaration under Section 564(b)(1) of the Act, 21  U.S.C. section 360bbb-3(b)(1), unless the authorization is  terminated or revoked. Performed at [facility], [place] [address] [place], [place] [address]    Time coordinating discharge: 35 minutes  SIGNED:  Kerney Elbe, DO [facility] 12/23/2019, 6:04 PM Pager is on [place]  If 7PM-7AM, please contact night-coverage [email or web address]

## 2019-12-23 NOTE — Progress Notes (Signed)
STROKE TEAM PROGRESS NOTE   INTERVAL HISTORY Pt friend at the bedside. Pt and friend stated that she was able to remember everything yesterday but still can not recall the events at the day before yesterday. She felt she had another episode of TGA similar to she had before. Repeat CT head negative.   Vitals:   12/22/19 2341 12/23/19 0401 12/23/19 0738 12/23/19 1211  BP: (!) 129/55 (!) 118/51 (!) 149/60 (!) 143/56  Pulse: 60 63 (!) 56 62  Resp: 18 16 16 20   Temp: 98.3 F (36.8 C) 98.2 F (36.8 C) (!) 97.5 F (36.4 C) 97.6 F (36.4 C)  TempSrc: Oral Oral Oral Oral  SpO2: 95% 97% 99% 98%  Weight:      Height:       CBC:  Recent Labs  Lab 12/22/19 0720 12/22/19 0720 12/22/19 1305 12/23/19 0823  WBC 7.5  --   --  9.2  NEUTROABS DUPLICATE REQUEST  5.7  --   --   --   HGB 14.2   < > 14.6 13.3  HCT 43.2   < > 43.0 42.3  MCV 91.1  --   --  94.8  PLT 165  --   --  173   < > = values in this interval not displayed.   Basic Metabolic Panel:  Recent Labs  Lab 12/22/19 0010 12/22/19 0010 12/22/19 1305 12/23/19 0823  NA 137   < > 141 139  K 3.6   < > 3.8 3.7  CL 103   < > 105 106  CO2 24  --   --  22  GLUCOSE 181*   < > 150* 125*  BUN 15   < > 10 16  CREATININE 0.85   < > 0.50 0.75  CALCIUM 9.6  --   --  9.4  MG  --   --   --  2.0  PHOS  --   --   --  2.3*   < > = values in this interval not displayed.   Lipid Panel:  Recent Labs  Lab 12/23/19 0823  CHOL 129  TRIG 69  HDL 44  CHOLHDL 2.9  VLDL 14  LDLCALC NOT CALCULATED   HgbA1c:  Recent Labs  Lab 12/23/19 0823  HGBA1C 5.5   Urine Drug Screen:  Recent Labs  Lab 12/22/19 1136  LABOPIA NONE DETECTED  COCAINSCRNUR NONE DETECTED  LABBENZ NONE DETECTED  AMPHETMU NONE DETECTED  THCU NONE DETECTED  LABBARB NONE DETECTED    Alcohol Level  Recent Labs  Lab 12/22/19 0720  ETH <10    IMAGING past 24 hours CT HEAD WO CONTRAST  Result Date: 12/23/2019 CLINICAL DATA:  Stroke follow-up EXAM: CT HEAD  WITHOUT CONTRAST TECHNIQUE: Contiguous axial images were obtained from the base of the skull through the vertex without intravenous contrast. COMPARISON:  Yesterday FINDINGS: Brain: No evidence of acute infarction, hemorrhage, hydrocephalus, extra-axial collection or mass lesion/mass effect. Mild patchy white matter low-density that is non progressed. Vascular: No hyperdense vessel or unexpected calcification. Skull: Normal. Negative for fracture or focal lesion. Sinuses/Orbits: Cochlear implant on the right with unavoidable streak artifact. No complicating features. IMPRESSION: Stable head CT.  No hemorrhage or visible infarct. Electronically Signed   By: Monte Fantasia M.D.   On: 12/23/2019 05:37    PHYSICAL EXAM  Temp:  [97.5 F (36.4 C)-98.3 F (36.8 C)] 97.6 F (36.4 C) (10/07 1211) Pulse Rate:  [56-75] 62 (10/07 1211) Resp:  [16-20] 20 (10/07  1211) BP: (118-157)/(51-60) 143/56 (10/07 1211) SpO2:  [90 %-99 %] 98 % (10/07 1211)  General - Well nourished, well developed, in no apparent distress.  Ophthalmologic - fundi not visualized due to noncooperation.  Cardiovascular - Regular rhythm and rate, not in A. fib.  Mental Status -  Level of arousal and orientation to time, place, and person were intact. Language including expression, naming, repetition, comprehension was assessed and found intact. Fund of Knowledge was assessed and was intact.  Cranial Nerves II - XII - II - Visual field intact OU. III, IV, VI - Extraocular movements intact. V - Facial sensation intact bilaterally. VII - Facial movement intact bilaterally. VIII - Hearing & vestibular intact bilaterally. X - Palate elevates symmetrically. XI - Chin turning & shoulder shrug intact bilaterally. XII - Tongue protrusion intact.  Motor Strength - The patient's strength was normal in all extremities and pronator drift was absent.  Bulk was normal and fasciculations were absent.   Motor Tone - Muscle tone was assessed  at the neck and appendages and was normal.  Reflexes - The patient's reflexes were symmetrical in all extremities and she had no pathological reflexes.  Sensory - Light touch, temperature/pinprick were assessed and were symmetrical.    Coordination - The patient had normal movements in the hands with no ataxia or dysmetria.  Tremor was absent.  Gait and Station - deferred.   ASSESSMENT/PLAN Ms. RAYONA SARDINHA is a 79 y.o. female with history of MI, TGA, hypertension, fibroids status post hysterectomy, obesity w/ a cochlear implant who was seen in the ED earlier in the evening w/ new dx Aflutter started on rivaroxaban who represented with confusion. Concern for recurrent TGA.  Recurrent TGA vs. Hypertensive encephalopathy vs. TIA  Code Stroke CT head No acute abnormality.   Repeat CT 24h Unremarkable   TCD unremarkable   Carotid Doppler  B ICA 1-39% stenosis, VAs antegrade   2D Echo EF 60-65%. No source of embolus. LA moderately dilated.  EEG neg sz  LDL UTC, TC 120, TG 69   HgbA1c 5.5  VTE prophylaxis - Xarelto  Xarelto (rivaroxaban) daily prior to admission, now Xarelto (rivaroxaban) daily and continue at d/c  Therapy recommendations:  No needs  Disposition:  Return home  Atrial Flutter, new dx  Xarelto just started day of admission for new dx AFlutter . Continue Xarelto (rivaroxaban) daily at discharge  Hypertensive urgency  Stable  . Long-term BP goal normotensive  Hyperlipidemia  Home meds:  crestor 10, resumed in hospital  LDL UTC, TC 120, TG 69 , goal < 70  Continue home statin dose at discharge  Other Stroke Risk Factors  Advanced age  Former Cigarette smoker, quit 51 yrs ago  Obesity, Body mass index is 35.97 kg/m., recommend weight loss, diet and exercise as appropriate   Coronary artery disease, hx STEMI w/ PCI  Chronic systolic CHF  Other Active Problems  HOH w/ Cochlear implant  Hypothyroidism   Hospital day # 0  Neurology  will sign off. Please call with questions. Pt will follow up with stroke clinic NP at [facility] in about 4 weeks. Thanks for the consult.   Rosalin Hawking, MD PhD Stroke Neurology 12/23/2019 3:57 PM    To contact Stroke Continuity provider, please refer to [email or web address] After hours, contact General Neurology

## 2019-12-24 ENCOUNTER — Telehealth: Payer: Self-pay | Admitting: Interventional Cardiology

## 2019-12-24 ENCOUNTER — Other Ambulatory Visit: Payer: Self-pay | Admitting: Internal Medicine

## 2019-12-24 MED ORDER — RIVAROXABAN 20 MG PO TABS
20.0000 mg | ORAL_TABLET | Freq: Every day | ORAL | 11 refills | Status: DC
Start: 2019-12-24 — End: 2022-10-26

## 2019-12-24 NOTE — Telephone Encounter (Signed)
     I went in pt's chart to see what she was admitted to [facility] for on 12-22-19. I made pt a follow up visit with Dr Irish Lack.

## 2019-12-24 NOTE — Progress Notes (Signed)
Patient called and stated she never got her script however it was continued at D/C (had it PTA). Will renew and send to Pharmacy.

## 2019-12-29 DIAGNOSIS — I509 Heart failure, unspecified: Secondary | ICD-10-CM | POA: Diagnosis not present

## 2019-12-29 DIAGNOSIS — I4892 Unspecified atrial flutter: Secondary | ICD-10-CM | POA: Diagnosis not present

## 2019-12-29 DIAGNOSIS — I2510 Atherosclerotic heart disease of native coronary artery without angina pectoris: Secondary | ICD-10-CM | POA: Diagnosis not present

## 2019-12-29 DIAGNOSIS — E669 Obesity, unspecified: Secondary | ICD-10-CM | POA: Diagnosis not present

## 2019-12-29 DIAGNOSIS — I10 Essential (primary) hypertension: Secondary | ICD-10-CM | POA: Diagnosis not present

## 2020-01-02 NOTE — Progress Notes (Signed)
Cardiology Office Note   Date:  01/03/2020   ID:  Kimberly, [name] 29-Dec-1940, MRN [id number]  PCP:  Burnard Bunting, MD    No chief complaint on file.  CAD  Wt Readings from Last 3 Encounters:  01/03/20 222 lb (100.7 kg)  12/22/19 222 lb 14.2 oz (101.1 kg)  10/14/19 (!) 222 lb 12.8 oz (101.1 kg)       History of Present Illness: [name] is a 79 y.o. female  with a hx of CAD status post inferoposterior STEMI 03/2013 treated with a BMS to the left circumflex, HL, HTN, IV dye allergy. Post PCI, she had recurrent nonsustained ventricular tachycardia. Relook cardiac catheterization demonstrated patent circumflex stent. Ejection fraction has been well-preserved. She has had persistent DOE since her MI without significant change. Echo in 06/2013 demonstrated normal LVF. PFTs were unremarkable in 2015. She has seen pulmonology as well.   Admitted to the hospital in 06/2014 with acute blood loss anemia from profound vaginal bleeding. Hemoglobin dropped to 7.9. She was transfused with 1 unit of PRBCs. Antiplatelet agents were stopped. Echo showed mildly decreased left ventricular function. She ultimately underwent a stress test due to decreased ejection fraction. No ischemia was found. Ultimately, she ahd a hystectomy in 2016.  She had a lung cancer patient who is staying with her during [place] isolation.  Her nephew developed CHF.   She received her vaccines, back in [facility] 2021.  Had an episode when she woke up with an irregular heart beat. BP increased as well.  She went to the ER.  She does not have a full memory of these events.    Review of records showed atrial flutter with 2:1 conduction.  SHe converted on her own to NSR.  SHe was started on Xarelto.    No problems since then.  Denies : Chest pain. Dizziness. Leg edema. Nitroglycerin use. Orthopnea. Palpitations. Paroxysmal nocturnal dyspnea. Shortness of breath. Syncope.   Past Medical History:    Diagnosis Date  . Acute inferolateral myocardial infarction ([place]) 03/24/2013  . Acute MI, inferoposterior wall, initial episode of care ([place]) 03/28/2013  . Acute myocardial infarction of other inferior wall, initial episode of care   . Amnesia, global, transient   . Arthritis    needs left knee replacement  . CAD (coronary artery disease)    a. 03/2013 Infpost STEMI/PCI: LM nl, LAD min irregs, LCX 100 (3.0x12 Vision BMS), RCA  mild ostial dzs, EF 55%.  . Essential hypertension, benign   . Fibroids    a. uterine - spotting noted since 03/11/2013.  Marland Kitchen GERD 11/19/2007   Qualifier: Diagnosis of  By: Henrene Pastor MD, Docia Chuck   . GERD (gastroesophageal reflux disease)   . Hearing loss    a. s/p cochlear implant on right, hearing aid on left.  Marland Kitchen Hernia of anterior abdominal wall   . History of cardiovascular stress test    Lexiscan Myoview 8/16:  EF 58%, inferolateral scar, no ischemia; Low Risk  . Hyperlipidemia   . Hypothyroidism   . Ischemic cardiomyopathy    Echo 7/16: Septal and posterior lateral HK, EF 45-50%, mild LAE  . IV Contrast Allergy   . Near syncope 06/26/2014  . Obesity   . Old MI (myocardial infarction) 03/03/2014  . Retrograde amnesia 06/26/2014  . Rosacea   . Ventricular tachycardia ([place])    a. Immediate post pci/MI - no complications, on BB.    Past Surgical History:  Procedure Laterality Date  .  CERVICAL POLYPECTOMY     2010  . CHOLECYSTECTOMY    . COCHLEAR IMPLANT    . LEFT HEART CATHETERIZATION WITH CORONARY ANGIOGRAM N/A 03/25/2013   Procedure: LEFT HEART CATHETERIZATION WITH CORONARY ANGIOGRAM;  Surgeon: Burnell Blanks, MD;  Location: [facility] CATH LAB;  Service: Cardiovascular;  Laterality: N/A;  . ROBOTIC ASSISTED TOTAL HYSTERECTOMY WITH BILATERAL SALPINGO OOPHERECTOMY Bilateral 08/16/2014   Procedure: ROBOTIC ASSISTED TOTAL HYSTERECTOMY WITH BILATERAL SALPINGO OOPHORECTOMY ;  Surgeon: Everitt Amber, MD;  Location: [facility] ORS;  Service: Gynecology;  Laterality: Bilateral;  .  UPPER GASTROINTESTINAL ENDOSCOPY    . uterine polyp     had D and C done to remove the polyp     Current Outpatient Medications  Medication Sig Dispense Refill  . Calcium Carb-Cholecalciferol (CALCIUM + D3) 600-200 MG-UNIT TABS Take 1 tablet by mouth every morning.     . Cyanocobalamin (VITAMIN B 12 PO) Take 1,000 tablets by mouth daily.    . diclofenac sodium (VOLTAREN) 1 % GEL Apply 2 g topically 2 (two) times daily. Applies to knees    . furosemide (LASIX) 20 MG tablet Take 1 tablet (20 mg total) by mouth daily. 90 tablet 1  . levothyroxine (SYNTHROID, LEVOTHROID) 100 MCG tablet Take 100 mcg by mouth daily before breakfast.    . lisinopril (ZESTRIL) 40 MG tablet TAKE 1 TABLET ONCE DAILY. 90 tablet 0  . metoprolol succinate (TOPROL-XL) 50 MG 24 hr tablet Take 1 tablet (50 mg total) by mouth daily. Take with or immediately following a meal. 90 tablet 3  . metroNIDAZOLE (METROGEL) 0.75 % gel Apply 1 application topically daily. Patient places on her cheeks and nose, only about once or twice a week depending on the climate.    Marland Kitchen NITROSTAT 0.4 MG SL tablet DISSOLVE 1 TABLET UNDER TONGUE AS NEEDED FOR CHEST PAIN,MAY REPEAT IN5 MINUTES FOR 2 DOSES. 25 tablet 3  . pantoprazole (PROTONIX) 40 MG tablet TAKE 1 TABLET ONCE DAILY. 90 tablet 3  . potassium chloride SA (KLOR-CON) 20 MEQ tablet Take 1 tablet (20 mEq total) by mouth daily. 90 tablet 1  . rivaroxaban (XARELTO) 20 MG TABS tablet Take 1 tablet (20 mg total) by mouth daily with supper. 30 tablet 11  . rosuvastatin (CRESTOR) 20 MG tablet Take 20 mg by mouth 2 (two) times a week.     No current facility-administered medications for this visit.    Allergies:   Bee venom, Contrast media [iodinated diagnostic agents], Fish allergy, and Iohexol    Social History:  The patient  reports that she quit smoking about 51 years ago. Her smoking use included cigarettes. She has a 0.40 pack-year smoking history. She has never used smokeless tobacco. She  reports that she does not drink alcohol and does not use drugs.   Family History:  The patient's family history includes Cancer in her mother; Colon cancer in her cousin; Diabetes in her mother and sister; Heart attack in her maternal grandmother and mother; Heart disease in her mother; Heart failure in her mother; Hyperlipidemia in her sister; Hypertension in her mother; Other in her father.    ROS:  Please see the history of present illness.   Otherwise, review of systems are positive for hearing impairment.   All other systems are reviewed and negative.    PHYSICAL EXAM: VS:  BP 124/84   Pulse 71   Ht 5\' 6"  (1.676 m)   Wt 222 lb (100.7 kg)   SpO2 98%   BMI 35.83  kg/m  , BMI Body mass index is 35.83 kg/m. GEN: Well nourished, well developed, in no acute distress  HEENT: normal  Neck: no JVD, carotid bruits, or masses Cardiac: RRR; no murmurs, rubs, or gallops,no edema  Respiratory:  clear to auscultation bilaterally, normal work of breathing GI: soft, nontender, nondistended, + BS MS: no deformity or atrophy  Skin: warm and dry, no rash Neuro:  Strength and sensation are intact Psych: euthymic mood, full affect   EKG:   The ekg ordered 10/6 demonstrates NSR   Recent Labs: 12/22/2019: TSH 0.391 12/23/2019: ALT 12; BUN 16; Creatinine, Ser 0.75; Hemoglobin 13.3; Magnesium 2.0; Platelets 173; Potassium 3.7; Sodium 139   Lipid Panel    Component Value Date/Time   CHOL 129 12/23/2019 0823   CHOL 148 09/30/2017 0832   TRIG 69 12/23/2019 0823   HDL 44 12/23/2019 0823   HDL 46 09/30/2017 0832   CHOLHDL 2.9 12/23/2019 0823   VLDL 14 12/23/2019 0823   LDLCALC NOT CALCULATED 12/23/2019 0823   LDLCALC 78 09/30/2017 0832     Other studies Reviewed: Additional studies/ records that were reviewed today with results demonstrating: Total cholesterol 129 in 10/21.   ASSESSMENT AND PLAN:  1. CAD/Old MI: COntinue aggressive secondary prevention.  No angina on medical therapy.    Stop aspirin with Xarelto. 2. Atrial flutter: Back in NSR.  Xarelto for stroke prevention.  If atrial flutter recurs, refer to EP for possible ablation.  3. Hypertensive heart disease: Low salt diet.  Regular exercise.  High readings with the atrial flutter.  4. Hyperlipidemia: Whole food, plant based diet recommended.  TOlerating Crestor 2x/week.   5. Leg edema: Elevate legs.  6. Getting third COVID vaccine in a few weeks.    Current medicines are reviewed at length with the patient today.  The patient concerns regarding her medicines were addressed.  The following changes have been made:  No change  Labs/ tests ordered today include:  No orders of the defined types were placed in this encounter.   Recommend 150 minutes/week of aerobic exercise Low fat, low carb, high fiber diet recommended  Disposition:   FU in 6 months    Signed, Larae Grooms, MD  01/03/2020 3:12 PM    [facility] [address], [place], [place]  [address] Phone: [phone]; Fax: [phone]

## 2020-01-03 ENCOUNTER — Ambulatory Visit (INDEPENDENT_AMBULATORY_CARE_PROVIDER_SITE_OTHER): Payer: Medicare Other | Admitting: Interventional Cardiology

## 2020-01-03 ENCOUNTER — Encounter: Payer: Self-pay | Admitting: Interventional Cardiology

## 2020-01-03 ENCOUNTER — Other Ambulatory Visit: Payer: Self-pay

## 2020-01-03 VITALS — BP 124/84 | HR 71 | Ht 66.0 in | Wt 222.0 lb

## 2020-01-03 DIAGNOSIS — I252 Old myocardial infarction: Secondary | ICD-10-CM

## 2020-01-03 DIAGNOSIS — I119 Hypertensive heart disease without heart failure: Secondary | ICD-10-CM | POA: Diagnosis not present

## 2020-01-03 DIAGNOSIS — I25118 Atherosclerotic heart disease of native coronary artery with other forms of angina pectoris: Secondary | ICD-10-CM | POA: Diagnosis not present

## 2020-01-03 DIAGNOSIS — E782 Mixed hyperlipidemia: Secondary | ICD-10-CM | POA: Diagnosis not present

## 2020-01-03 NOTE — Patient Instructions (Signed)

## 2020-01-14 ENCOUNTER — Ambulatory Visit: Payer: Medicare Other | Admitting: Interventional Cardiology

## 2020-01-17 ENCOUNTER — Ambulatory Visit: Payer: Medicare Other | Attending: Internal Medicine

## 2020-01-17 ENCOUNTER — Other Ambulatory Visit (HOSPITAL_BASED_OUTPATIENT_CLINIC_OR_DEPARTMENT_OTHER): Payer: Self-pay | Admitting: Internal Medicine

## 2020-01-17 DIAGNOSIS — Z23 Encounter for immunization: Secondary | ICD-10-CM

## 2020-01-17 NOTE — Progress Notes (Signed)
   Covid-19 Vaccination Clinic  Name:  DALYA MASELLI    MRN: [id number] DOB: 1940-12-23  01/17/2020  Ms. Deckman was observed post Covid-19 immunization for 15 minutes without incident. She was provided with Vaccine Information Sheet and instruction to access the V-Safe system.   Ms. Samad was instructed to call 911 with any severe reactions post vaccine: Marland Kitchen Difficulty breathing  . Swelling of face and throat  . A fast heartbeat  . A bad rash all over body  . Dizziness and weakness

## 2020-01-25 ENCOUNTER — Ambulatory Visit (INDEPENDENT_AMBULATORY_CARE_PROVIDER_SITE_OTHER): Payer: Medicare Other | Admitting: Adult Health

## 2020-01-25 ENCOUNTER — Encounter: Payer: Self-pay | Admitting: Adult Health

## 2020-01-25 VITALS — BP 143/74 | HR 68 | Ht 66.0 in | Wt 221.0 lb

## 2020-01-25 DIAGNOSIS — I10 Essential (primary) hypertension: Secondary | ICD-10-CM | POA: Diagnosis not present

## 2020-01-25 DIAGNOSIS — G454 Transient global amnesia: Secondary | ICD-10-CM

## 2020-01-25 DIAGNOSIS — E782 Mixed hyperlipidemia: Secondary | ICD-10-CM

## 2020-01-25 DIAGNOSIS — G459 Transient cerebral ischemic attack, unspecified: Secondary | ICD-10-CM | POA: Diagnosis not present

## 2020-01-25 NOTE — Patient Instructions (Signed)
Continue Xarelto (rivaroxaban) daily  and Crestor for secondary stroke prevention  Continue to follow with cardiology for atrial fibrillation and Xarelto prescribing  Continue to follow up with PCP regarding cholesterol and blood pressure management  Maintain strict control of hypertension with blood pressure goal below 130/90 and cholesterol with LDL cholesterol (bad cholesterol) goal below 70 mg/dL.      Followup in the future with me in 6 months or call earlier if needed       Thank you for coming to see [place] at [facility]. I hope we have been able to provide you high quality care today.  You may receive a patient satisfaction survey over the next few weeks. We would appreciate your feedback and comments so that we may continue to improve ourselves and the health of our patients.    Transient Global Amnesia Transient global amnesia causes a sudden and temporary (transient) loss of memory (amnesia). You may recall memories from your distant past and people you know well. However, you may not recall things that happened more recently in the past days, months, or even year. A transient global amnesia episode does not last longer than 24 hours. Transient global amnesia does not affect your other brain functions. Your memory usually returns to normal after an episode is over. One episode of transient global amnesia does not make you more likely to have a stroke, a relapse, or other complications. What are the causes? The cause of this condition is not known. Certain activities have been reported to trigger transient global amnesia. These activities include:  Swimming in very cold or hot water.  Sexual intercourse.  Emotional distress, such as receiving bad news or having a lot of stress at once.  Strenuous exercise or activity. What increases the risk? You are more likely to develop this condition if:  You are 26-34 years old.  You have a history of migraine  headaches. What are the signs or symptoms? The main symptoms of this condition include:  Being unable to remember recent events.  Asking repetitive questions about a situation and surroundings and not recalling the answers to these questions. Other symptoms include:  Restlessness and nervousness.  Confusion.  Headaches.  Dizziness.  Nausea. How is this diagnosed? This condition may be diagnosed based on:  Your symptoms.  A physical exam.  A test to check your mental abilities (cognitive evaluation).  Imaging studies to check brain function. These may include: ? Electroencephalogram (EEG). This test checks the brain's electrical activity. ? CT scan. ? MRI. How is this treated? There is no treatment for this condition. An episode typically goes away on its own after a few hours. You may receive medicines to treat other conditions, such as a migraine. Follow these instructions at home:  Take over-the-counter and prescription medicines only as told by your health care provider.  Avoid taking medicines that can affect thinking, such as pain or sleeping medicines.  Learn what activities may trigger an episode. Avoid these activities as told by your health care provider.  Find ways to manage stress, such as meditation or yoga.  Keep all follow-up visits as told by your health care provider. This is important. Contact a health care provider if you:  Have a migraine that does not go away.  Experience transient global amnesia repeatedly. Get help right away if you:  Have a seizure. Summary  Transient global amnesia causes a sudden and temporary (transient) loss of memory (amnesia).  There is no treatment  for this condition. An episode typically goes away on its own after a few hours.  You may receive medicines to treat other conditions, such as a migraine.  Transient global amnesia does not affect your other brain functions. Your memory usually returns to normal  after an episode is over. This information is not intended to replace advice given to you by your health care provider. Make sure you discuss any questions you have with your health care provider. Document Revised: 03/19/2017 Document Reviewed: 03/19/2017 Elsevier Patient Education  2020 [employer].

## 2020-01-25 NOTE — Progress Notes (Signed)
[facility] [address] [place]. [place] [address] [phone]       HOSPITAL FOLLOW UP NOTE  [name] Date of Birth:  1941-02-23 Medical Record Number:  [id number]   Reason for Referral:  hospital follow up    SUBJECTIVE:   CHIEF COMPLAINT:  Chief Complaint  Patient presents with  . Hospitalization Follow-up    tx rm, alone, pt states she is feeling well     HPI:   [name] is a 79 y.o. female with history of MI, TGA, hypertension, fibroids status post hysterectomy, obesity w/ a cochlear implant who was seen in the ED earlier in the evening w/ new dx Aflutter started on rivaroxaban who represented with confusion on 12/22/2019.  Personally reviewed hospitalization pertinent progress notes, lab work and imaging with summary provided.  Evaluated by Dr. Erlinda Hong with stroke work-up largely unremarkable with ddx recurrent TGA vs hypertensive encephalopathy vs TIA.  Recommended continuation of Xarelto for new diagnosis of atrial flutter and secondary stroke prevention.  Presented with hypertensive urgency stabilized during admission recommended long-term BP goal normotensive range. Hx of HLD and recommended resuming Crestor 10 mg daily.  Other stroke risk factors include advanced age, former tobacco use, obesity, CAD with hx of STEMI w/ PCI and chronic systolic CHF.  Evaluated by therapies and discharged home in stable condition without therapy needs.  Recurrent TGA vs. Hypertensive encephalopathy vs. TIA  Code Stroke CT head No acute abnormality.   Repeat CT 24h Unremarkable   TCD unremarkable   Carotid Doppler  B ICA 1-39% stenosis, VAs antegrade   2D Echo EF 60-65%. No source of embolus. LA moderately dilated.  EEG neg sz  LDL UTC, TC 120, TG 69   HgbA1c 5.5  VTE prophylaxis - Xarelto  Xarelto (rivaroxaban) daily prior to admission, now Xarelto (rivaroxaban) daily and continue at d/c  Therapy recommendations:  No  needs  Disposition:  Return home  Today, 01/25/2020, Ms. Trimble is being seen for hospital follow-up unaccompanied.  She has been stable since discharge without reoccurring confusion episodes or new stroke/TIA symptoms.  Remains on Xarelto without bleeding or bruising.  Remains on Crestor 10mg  twice a week without myalgias.  Blood pressure today 143/74. Monitors at home with improvement and recently 135-140/65-70s.  No concerns at this time.    ROS:   14 system review of systems performed and negative with exception of no complaints  PMH:  Past Medical History:  Diagnosis Date  . Acute inferolateral myocardial infarction ([place]) 03/24/2013  . Acute MI, inferoposterior wall, initial episode of care ([place]) 03/28/2013  . Acute myocardial infarction of other inferior wall, initial episode of care   . Amnesia, global, transient   . Arthritis    needs left knee replacement  . CAD (coronary artery disease)    a. 03/2013 Infpost STEMI/PCI: LM nl, LAD min irregs, LCX 100 (3.0x12 Vision BMS), RCA  mild ostial dzs, EF 55%.  . Essential hypertension, benign   . Fibroids    a. uterine - spotting noted since 03/11/2013.  Marland Kitchen GERD 11/19/2007   Qualifier: Diagnosis of  By: Henrene Pastor MD, Docia Chuck   . GERD (gastroesophageal reflux disease)   . Hearing loss    a. s/p cochlear implant on right, hearing aid on left.  Marland Kitchen Hernia of anterior abdominal wall   . History of cardiovascular stress test    Lexiscan Myoview 8/16:  EF 58%, inferolateral scar, no ischemia; Low Risk  . Hyperlipidemia   .  Hypothyroidism   . Ischemic cardiomyopathy    Echo 7/16: Septal and posterior lateral HK, EF 45-50%, mild LAE  . IV Contrast Allergy   . Near syncope 06/26/2014  . Obesity   . Old MI (myocardial infarction) 03/03/2014  . Retrograde amnesia 06/26/2014  . Rosacea   . Ventricular tachycardia ([place])    a. Immediate post pci/MI - no complications, on BB.    PSH:  Past Surgical History:  Procedure Laterality Date  . CERVICAL  POLYPECTOMY     2010  . CHOLECYSTECTOMY    . COCHLEAR IMPLANT    . LEFT HEART CATHETERIZATION WITH CORONARY ANGIOGRAM N/A 03/25/2013   Procedure: LEFT HEART CATHETERIZATION WITH CORONARY ANGIOGRAM;  Surgeon: Burnell Blanks, MD;  Location: [facility] CATH LAB;  Service: Cardiovascular;  Laterality: N/A;  . ROBOTIC ASSISTED TOTAL HYSTERECTOMY WITH BILATERAL SALPINGO OOPHERECTOMY Bilateral 08/16/2014   Procedure: ROBOTIC ASSISTED TOTAL HYSTERECTOMY WITH BILATERAL SALPINGO OOPHORECTOMY ;  Surgeon: Everitt Amber, MD;  Location: [facility] ORS;  Service: Gynecology;  Laterality: Bilateral;  . UPPER GASTROINTESTINAL ENDOSCOPY    . uterine polyp     had D and C done to remove the polyp    Social History:  Social History   Socioeconomic History  . Marital status: Divorced    Spouse name: Not on file  . Number of children: Not on file  . Years of education: Not on file  . Highest education level: Not on file  Occupational History  . Occupation: retired from Data processing manager work  Tobacco Use  . Smoking status: Former Smoker    Packs/day: 0.20    Years: 2.00    Pack years: 0.40    Types: Cigarettes    Quit date: 03/18/1968    Years since quitting: 51.8  . Smokeless tobacco: Never Used  Substance and Sexual Activity  . Alcohol use: No  . Drug use: No  . Sexual activity: Not on file  Other Topics Concern  . Not on file  Social History Narrative   Lives in [place] by herself.  Sister is nearby and is Healthcare POA.   Social Determinants of Health   Financial Resource Strain:   . Difficulty of Paying Living Expenses: Not on file  Food Insecurity:   . Worried About Charity fundraiser in the Last Year: Not on file  . Ran Out of Food in the Last Year: Not on file  Transportation Needs:   . Lack of Transportation (Medical): Not on file  . Lack of Transportation (Non-Medical): Not on file  Physical Activity:   . Days of Exercise per Week: Not on file  . Minutes of Exercise per Session: Not on file   Stress:   . Feeling of Stress : Not on file  Social Connections:   . Frequency of Communication with Friends and Family: Not on file  . Frequency of Social Gatherings with Friends and Family: Not on file  . Attends Religious Services: Not on file  . Active Member of Clubs or Organizations: Not on file  . Attends Archivist Meetings: Not on file  . Marital Status: Not on file  Intimate Partner Violence:   . Fear of Current or Ex-Partner: Not on file  . Emotionally Abused: Not on file  . Physically Abused: Not on file  . Sexually Abused: Not on file    Family History:  Family History  Problem Relation Age of Onset  . Heart failure Mother        died in  her 70's.  . Cancer Mother   . Diabetes Mother   . Heart disease Mother   . Hypertension Mother   . Heart attack Mother   . Hyperlipidemia Sister   . Diabetes Sister   . Other Father        died in WWII  . Colon cancer Cousin   . Heart attack Maternal Grandmother   . Esophageal cancer Neg Hx   . Stomach cancer Neg Hx   . Stroke Neg Hx     Medications:   Current Outpatient Medications on File Prior to Visit  Medication Sig Dispense Refill  . Calcium Carb-Cholecalciferol (CALCIUM + D3) 600-200 MG-UNIT TABS Take 1 tablet by mouth every morning.     . Cyanocobalamin (VITAMIN B 12 PO) Take 1,000 tablets by mouth daily.    . diclofenac sodium (VOLTAREN) 1 % GEL Apply 2 g topically 2 (two) times daily. Applies to knees    . furosemide (LASIX) 20 MG tablet Take 1 tablet (20 mg total) by mouth daily. 90 tablet 1  . levothyroxine (SYNTHROID, LEVOTHROID) 100 MCG tablet Take 100 mcg by mouth daily before breakfast.    . lisinopril (ZESTRIL) 40 MG tablet TAKE 1 TABLET ONCE DAILY. 90 tablet 0  . metoprolol succinate (TOPROL-XL) 50 MG 24 hr tablet Take 1 tablet (50 mg total) by mouth daily. Take with or immediately following a meal. 90 tablet 3  . metroNIDAZOLE (METROGEL) 0.75 % gel Apply 1 application topically daily. Patient  places on her cheeks and nose, only about once or twice a week depending on the climate.    Marland Kitchen NITROSTAT 0.4 MG SL tablet DISSOLVE 1 TABLET UNDER TONGUE AS NEEDED FOR CHEST PAIN,MAY REPEAT IN5 MINUTES FOR 2 DOSES. 25 tablet 3  . pantoprazole (PROTONIX) 40 MG tablet TAKE 1 TABLET ONCE DAILY. 90 tablet 3  . potassium chloride SA (KLOR-CON) 20 MEQ tablet Take 1 tablet (20 mEq total) by mouth daily. 90 tablet 1  . rivaroxaban (XARELTO) 20 MG TABS tablet Take 1 tablet (20 mg total) by mouth daily with supper. 30 tablet 11  . rosuvastatin (CRESTOR) 20 MG tablet Take 10 mg by mouth 2 (two) times a week.      No current facility-administered medications on file prior to visit.    Allergies:   Allergies  Allergen Reactions  . Bee Venom Itching and Swelling  . Contrast Media [Iodinated Diagnostic Agents] Hives    dye  . Fish Allergy Anaphylaxis    Shell fish  . Iohexol Hives      OBJECTIVE:  Physical Exam  Vitals:   01/25/20 1415  BP: (!) 143/74  Pulse: 68  Weight: 221 lb (100.2 kg)  Height: 5\' 6"  (1.676 m)   Body mass index is 35.67 kg/m. No exam data present  General: well developed, well nourished,  pleasant elderly Caucasian female, seated, in no evident distress Head: head normocephalic and atraumatic.   Neck: supple with no carotid or supraclavicular bruits Cardiovascular: regular rate and rhythm, no murmurs Musculoskeletal: no deformity Skin:  no rash/petichiae Vascular:  Normal pulses all extremities   Neurologic Exam Mental Status: Awake and fully alert.   Fluent speech and language.  Oriented to place and time. Recent and remote memory intact. Attention span, concentration and fund of knowledge appropriate. Mood and affect appropriate.  Cranial Nerves: Fundoscopic exam reveals sharp disc margins. Pupils equal, briskly reactive to light. Extraocular movements full without nystagmus. Visual fields full to confrontation. Hearing intact. Facial sensation intact. Face,  tongue, palate moves normally and symmetrically.  Motor: Normal bulk and tone. Normal strength in all tested extremity muscles. Sensory.: intact to touch , pinprick , position and vibratory sensation.  Coordination: Rapid alternating movements normal in all extremities. Finger-to-nose and heel-to-shin performed accurately bilaterally. Gait and Station: Arises from chair without difficulty. Stance is normal. Gait demonstrates normal stride length and balance Reflexes: 1+ and symmetric. Toes downgoing.     NIHSS  0 Modified Rankin  0     ASSESSMENT: [name] is a 79 y.o. year old female evaluated in the ED for palpitations on 12/22/2019 diagnosed with atrial flutter and started on Xarelto and discharged home.  She returned later that evening with complaints of confusion with concern of recurrent TGA vs hypertensive encephalopathy vs TIA and stroke work-up unremarkable. Vascular risk factors include hx of TGA, hx of MI, HTN, HLD, new dx, former tobacco use, obesity, CAD and chronic systolic CHF.      PLAN:  1. TGA vs TIA :  a. Continue Xarelto (rivaroxaban) daily  and Crestor for secondary stroke prevention.  b. Discussed secondary stroke prevention measures and importance of close PCP follow up for aggressive stroke risk factor management  2. New dx A flutter: Continue Xarelto per cardiology 3. HTN: BP goal <130/90.  Stable on metoprolol and lisinopril per cardiology 4. HLD: LDL goal <70.  Stable on Crestor per PCP    Follow up in 6 months or call earlier if needed  CC:  [place] provider: Dr. Lorrine Kin, Richard, MD    I spent 45 minutes of face-to-face and non-face-to-face time with patient.  This included previsit chart review including recent hospitalization pertinent progress notes, lab work and imaging, lab review, study review, order entry, electronic health record documentation, patient education regarding confusional event possibly TGA versus TIA, importance of  managing stroke risk factors and answered all questions to patient satisfaction    Frann Rider, AGNP-BC  [facility] [address] [place], [place] [address]  Phone [phone] Fax [phone] Note: This document was prepared with digital dictation and possible smart phrase technology. Any transcriptional errors that result from this process are unintentional.

## 2020-01-26 ENCOUNTER — Ambulatory Visit (HOSPITAL_COMMUNITY): Payer: Medicare Other

## 2020-01-26 MED FILL — PFIZER-BIONTECH COVID-19 VA: 30 | 1 days supply | Qty: 0 | Fill #0

## 2020-01-26 NOTE — Progress Notes (Signed)
I agree with the above plan 

## 2020-01-27 DIAGNOSIS — I10 Essential (primary) hypertension: Secondary | ICD-10-CM | POA: Diagnosis not present

## 2020-01-27 DIAGNOSIS — E669 Obesity, unspecified: Secondary | ICD-10-CM | POA: Diagnosis not present

## 2020-01-27 DIAGNOSIS — E785 Hyperlipidemia, unspecified: Secondary | ICD-10-CM | POA: Diagnosis not present

## 2020-01-27 DIAGNOSIS — H00015 Hordeolum externum left lower eyelid: Secondary | ICD-10-CM | POA: Diagnosis not present

## 2020-03-08 ENCOUNTER — Other Ambulatory Visit: Payer: Self-pay | Admitting: Interventional Cardiology

## 2020-07-25 ENCOUNTER — Ambulatory Visit (INDEPENDENT_AMBULATORY_CARE_PROVIDER_SITE_OTHER): Payer: Medicare Other | Admitting: Adult Health

## 2020-07-25 ENCOUNTER — Encounter: Payer: Self-pay | Admitting: Adult Health

## 2020-07-25 VITALS — BP 163/85 | HR 71 | Ht 66.0 in | Wt 220.0 lb

## 2020-07-25 DIAGNOSIS — E782 Mixed hyperlipidemia: Secondary | ICD-10-CM

## 2020-07-25 DIAGNOSIS — I10 Essential (primary) hypertension: Secondary | ICD-10-CM | POA: Diagnosis not present

## 2020-07-25 DIAGNOSIS — G454 Transient global amnesia: Secondary | ICD-10-CM

## 2020-07-25 DIAGNOSIS — I4892 Unspecified atrial flutter: Secondary | ICD-10-CM

## 2020-07-25 NOTE — Progress Notes (Signed)
[facility] [address] [place]. [place] [address] [phone]       OFFICE FOLLOW UP NOTE  [name] Date of Birth:  May 13, 1940 Medical Record Number:  [id number]   Reason for Referral:  TIA vs TGA follow up    SUBJECTIVE:   CHIEF COMPLAINT:  Chief Complaint  Patient presents with  . Follow-up    RM 14 Alone  Pt is well, states she hasn't had any problems.     HPI:   Today, 07/25/2020, [name] returns for 78-month follow-up.  Reports she has been doing well since prior visit without any new or reoccurring stroke/TIA symptoms. Compliant on Xarelto and Crestor without associated side effects. Blood pressure today elevated but routinely monitors at home and has been stable.  Plans on getting lab work tomorrow for her yearly physical with PCP and has cardiology follow-up coming up.  No new concerns at this time   History provided for reference purposes only Initial visit 01/25/2020 JM: [name] is being seen for hospital follow-up unaccompanied.  She has been stable since discharge without reoccurring confusion episodes or new stroke/TIA symptoms.  Remains on Xarelto without bleeding or bruising.  Remains on Crestor 10mg  twice a week without myalgias.  Blood pressure today 143/74. Monitors at home with improvement and recently 135-140/65-70s.  No concerns at this time.  Stroke admission 12/22/2019 [name] is a 80 y.o. female with history of MI, TGA, hypertension, fibroids status post hysterectomy, obesity w/ a cochlear implant who was seen in the ED earlier in the evening w/ new dx Aflutter started on rivaroxaban who represented with confusion on 12/22/2019.  Personally reviewed hospitalization pertinent progress notes, lab work and imaging with summary provided.  Evaluated by Dr. Erlinda Hong with stroke work-up largely unremarkable with ddx recurrent TGA vs hypertensive encephalopathy vs TIA.  Recommended continuation of Xarelto for new  diagnosis of atrial flutter and secondary stroke prevention.  Presented with hypertensive urgency stabilized during admission recommended long-term BP goal normotensive range. Hx of HLD and recommended resuming Crestor 10 mg daily.  Other stroke risk factors include advanced age, former tobacco use, obesity, CAD with hx of STEMI w/ PCI and chronic systolic CHF.  Evaluated by therapies and discharged home in stable condition without therapy needs.  Recurrent TGA vs. Hypertensive encephalopathy vs. TIA  Code Stroke CT head No acute abnormality.   Repeat CT 24h Unremarkable   TCD unremarkable   Carotid Doppler  B ICA 1-39% stenosis, VAs antegrade   2D Echo EF 60-65%. No source of embolus. LA moderately dilated.  EEG neg sz  LDL UTC, TC 120, TG 69   HgbA1c 5.5  VTE prophylaxis - Xarelto  Xarelto (rivaroxaban) daily prior to admission, now Xarelto (rivaroxaban) daily and continue at d/c  Therapy recommendations:  No needs  Disposition:  Return home    ROS:   14 system review of systems performed and negative with exception of no complaints  PMH:  Past Medical History:  Diagnosis Date  . Acute inferolateral myocardial infarction ([place]) 03/24/2013  . Acute MI, inferoposterior wall, initial episode of care ([place]) 03/28/2013  . Acute myocardial infarction of other inferior wall, initial episode of care   . Amnesia, global, transient   . Arthritis    needs left knee replacement  . CAD (coronary artery disease)    a. 03/2013 Infpost STEMI/PCI: LM nl, LAD min irregs, LCX 100 (3.0x12 Vision BMS), RCA  mild ostial dzs, EF 55%.  Marland Kitchen  Essential hypertension, benign   . Fibroids    a. uterine - spotting noted since 03/11/2013.  Marland Kitchen GERD 11/19/2007   Qualifier: Diagnosis of  By: Henrene Pastor MD, Docia Chuck   . GERD (gastroesophageal reflux disease)   . Hearing loss    a. s/p cochlear implant on right, hearing aid on left.  Marland Kitchen Hernia of anterior abdominal wall   . History of cardiovascular stress test     Lexiscan Myoview 8/16:  EF 58%, inferolateral scar, no ischemia; Low Risk  . Hyperlipidemia   . Hypothyroidism   . Ischemic cardiomyopathy    Echo 7/16: Septal and posterior lateral HK, EF 45-50%, mild LAE  . IV Contrast Allergy   . Near syncope 06/26/2014  . Obesity   . Old MI (myocardial infarction) 03/03/2014  . Retrograde amnesia 06/26/2014  . Rosacea   . Ventricular tachycardia ([place])    a. Immediate post pci/MI - no complications, on BB.    PSH:  Past Surgical History:  Procedure Laterality Date  . CERVICAL POLYPECTOMY     2010  . CHOLECYSTECTOMY    . COCHLEAR IMPLANT    . LEFT HEART CATHETERIZATION WITH CORONARY ANGIOGRAM N/A 03/25/2013   Procedure: LEFT HEART CATHETERIZATION WITH CORONARY ANGIOGRAM;  Surgeon: Burnell Blanks, MD;  Location: [facility] CATH LAB;  Service: Cardiovascular;  Laterality: N/A;  . ROBOTIC ASSISTED TOTAL HYSTERECTOMY WITH BILATERAL SALPINGO OOPHERECTOMY Bilateral 08/16/2014   Procedure: ROBOTIC ASSISTED TOTAL HYSTERECTOMY WITH BILATERAL SALPINGO OOPHORECTOMY ;  Surgeon: Everitt Amber, MD;  Location: [facility] ORS;  Service: Gynecology;  Laterality: Bilateral;  . UPPER GASTROINTESTINAL ENDOSCOPY    . uterine polyp     had D and C done to remove the polyp    Social History:  Social History   Socioeconomic History  . Marital status: Divorced    Spouse name: Not on file  . Number of children: Not on file  . Years of education: Not on file  . Highest education level: Not on file  Occupational History  . Occupation: retired from Data processing manager work  Tobacco Use  . Smoking status: Former Smoker    Packs/day: 0.20    Years: 2.00    Pack years: 0.40    Types: Cigarettes    Quit date: 03/18/1968    Years since quitting: 52.3  . Smokeless tobacco: Never Used  Substance and Sexual Activity  . Alcohol use: No  . Drug use: No  . Sexual activity: Not on file  Other Topics Concern  . Not on file  Social History Narrative   Lives in [place] by herself.  Sister is  nearby and is Healthcare POA.   Social Determinants of Health   Financial Resource Strain: Not on file  Food Insecurity: Not on file  Transportation Needs: Not on file  Physical Activity: Not on file  Stress: Not on file  Social Connections: Not on file  Intimate Partner Violence: Not on file    Family History:  Family History  Problem Relation Age of Onset  . Heart failure Mother        died in her 35's.  . Cancer Mother   . Diabetes Mother   . Heart disease Mother   . Hypertension Mother   . Heart attack Mother   . Hyperlipidemia Sister   . Diabetes Sister   . Other Father        died in WWII  . Colon cancer Cousin   . Heart attack Maternal Grandmother   . Esophageal cancer  Neg Hx   . Stomach cancer Neg Hx   . Stroke Neg Hx     Medications:   Current Outpatient Medications on File Prior to Visit  Medication Sig Dispense Refill  . Calcium Carb-Cholecalciferol (CALCIUM + D3) 600-200 MG-UNIT TABS Take 1 tablet by mouth every morning.     Marland Kitchen COVID-19 mRNA vaccine, Pfizer, 30 MCG/0.3ML injection INJECT AS DIRECTED .3 mL 0  . Cyanocobalamin (VITAMIN B 12 PO) Take 1,000 tablets by mouth daily.    . diclofenac sodium (VOLTAREN) 1 % GEL Apply 2 g topically 2 (two) times daily. Applies to knees    . furosemide (LASIX) 20 MG tablet Take 1 tablet (20 mg total) by mouth daily. 90 tablet 1  . levothyroxine (SYNTHROID, LEVOTHROID) 100 MCG tablet Take 100 mcg by mouth daily before breakfast.    . lisinopril (ZESTRIL) 40 MG tablet TAKE 1 TABLET ONCE DAILY. 90 tablet 3  . metoprolol succinate (TOPROL-XL) 50 MG 24 hr tablet Take 1 tablet (50 mg total) by mouth daily. Take with or immediately following a meal. 90 tablet 3  . metroNIDAZOLE (METROGEL) 0.75 % gel Apply 1 application topically daily. Patient places on her cheeks and nose, only about once or twice a week depending on the climate.    Marland Kitchen NITROSTAT 0.4 MG SL tablet DISSOLVE 1 TABLET UNDER TONGUE AS NEEDED FOR CHEST PAIN,MAY REPEAT  IN5 MINUTES FOR 2 DOSES. 25 tablet 3  . pantoprazole (PROTONIX) 40 MG tablet TAKE 1 TABLET ONCE DAILY. 90 tablet 3  . potassium chloride SA (KLOR-CON) 20 MEQ tablet Take 1 tablet (20 mEq total) by mouth daily. 90 tablet 1  . rivaroxaban (XARELTO) 20 MG TABS tablet Take 1 tablet (20 mg total) by mouth daily with supper. 30 tablet 11  . rosuvastatin (CRESTOR) 20 MG tablet Take 10 mg by mouth 2 (two) times a week.      No current facility-administered medications on file prior to visit.    Allergies:   Allergies  Allergen Reactions  . Bee Venom Itching and Swelling  . Contrast Media [Iodinated Diagnostic Agents] Hives    dye  . Fish Allergy Anaphylaxis    Shell fish  . Iohexol Hives      OBJECTIVE:  Physical Exam  Vitals:   07/25/20 1451  BP: (!) 163/85  Pulse: 71  Weight: 220 lb (99.8 kg)  Height: 5\' 6"  (1.676 m)   Body mass index is 35.51 kg/m. No exam data present  General: well developed, well nourished, very pleasant elderly Caucasian female, seated, in no evident distress Head: head normocephalic and atraumatic.   Neck: supple with no carotid or supraclavicular bruits Cardiovascular: regular rate and rhythm, no murmurs Musculoskeletal: no deformity Skin:  no rash/petichiae Vascular:  Normal pulses all extremities   Neurologic Exam Mental Status: Awake and fully alert.   Fluent speech and language.  Oriented to place and time. Recent and remote memory intact. Attention span, concentration and fund of knowledge appropriate. Mood and affect appropriate.  Cranial Nerves: Pupils equal, briskly reactive to light. Extraocular movements full without nystagmus. Visual fields full to confrontation. Hearing intact. Facial sensation intact. Face, tongue, palate moves normally and symmetrically.  Motor: Normal bulk and tone. Normal strength in all tested extremity muscles. Sensory.: intact to touch , pinprick , position and vibratory sensation.  Coordination: Rapid alternating  movements normal in all extremities. Finger-to-nose and heel-to-shin performed accurately bilaterally. Gait and Station: Arises from chair without difficulty. Stance is normal. Gait demonstrates normal stride  length and balance Reflexes: 1+ and symmetric. Toes downgoing.        ASSESSMENT: JASHAE MOUSEL is a 80 y.o. year old female evaluated in the ED for palpitations on 12/22/2019 diagnosed with atrial flutter and started on Xarelto and discharged home.  She returned later that evening with complaints of confusion with concern of recurrent TGA vs hypertensive encephalopathy vs TIA and stroke work-up unremarkable. Vascular risk factors include hx of TGA, hx of MI, HTN, HLD, new dx, former tobacco use, obesity, CAD and chronic systolic CHF.      PLAN:  1. TGA vs TIA :  a. Stable without new or recurring symptoms b. Continue Xarelto (rivaroxaban) daily  and Crestor for secondary stroke prevention.  c. Discussed secondary stroke prevention measures and importance of close PCP follow up for aggressive stroke risk factor management  2. A flutter: Continue Xarelto per cardiology 3. HTN: BP goal <130/90.  Slightly elevated today but stable at home on metoprolol and lisinopril per cardiology 4. HLD: LDL goal <70.  Plans on repeat lipid panel tomorrow with PCP.  On Crestor per PCP    Follow up in 6 months or call earlier if needed  CC:  [place] provider: Dr. Lorrine Kin, Delfino Lovett, MD      Frann Rider, [facility]  [facility] [address] [place], [place] [address]  Phone [phone] Fax [phone] Note: This document was prepared with digital dictation and possible smart phrase technology. Any transcriptional errors that result from this process are unintentional.

## 2020-07-26 DIAGNOSIS — E785 Hyperlipidemia, unspecified: Secondary | ICD-10-CM | POA: Diagnosis not present

## 2020-07-26 DIAGNOSIS — E039 Hypothyroidism, unspecified: Secondary | ICD-10-CM | POA: Diagnosis not present

## 2020-07-27 NOTE — Progress Notes (Signed)
I agree with the above plan 

## 2020-08-02 DIAGNOSIS — K921 Melena: Secondary | ICD-10-CM | POA: Diagnosis not present

## 2020-08-02 DIAGNOSIS — E039 Hypothyroidism, unspecified: Secondary | ICD-10-CM | POA: Diagnosis not present

## 2020-08-02 DIAGNOSIS — Z0000 Encounter for general adult medical examination without abnormal findings: Secondary | ICD-10-CM | POA: Diagnosis not present

## 2020-08-02 DIAGNOSIS — Z1331 Encounter for screening for depression: Secondary | ICD-10-CM | POA: Diagnosis not present

## 2020-08-02 DIAGNOSIS — I10 Essential (primary) hypertension: Secondary | ICD-10-CM | POA: Diagnosis not present

## 2020-08-02 DIAGNOSIS — I700 Atherosclerosis of aorta: Secondary | ICD-10-CM | POA: Diagnosis not present

## 2020-08-02 DIAGNOSIS — I2510 Atherosclerotic heart disease of native coronary artery without angina pectoris: Secondary | ICD-10-CM | POA: Diagnosis not present

## 2020-08-02 DIAGNOSIS — Z1339 Encounter for screening examination for other mental health and behavioral disorders: Secondary | ICD-10-CM | POA: Diagnosis not present

## 2020-08-02 DIAGNOSIS — E785 Hyperlipidemia, unspecified: Secondary | ICD-10-CM | POA: Diagnosis not present

## 2020-08-02 DIAGNOSIS — R82998 Other abnormal findings in urine: Secondary | ICD-10-CM | POA: Diagnosis not present

## 2020-08-02 DIAGNOSIS — E669 Obesity, unspecified: Secondary | ICD-10-CM | POA: Diagnosis not present

## 2020-08-04 ENCOUNTER — Ambulatory Visit: Payer: Medicare Other | Admitting: Interventional Cardiology

## 2020-08-08 ENCOUNTER — Other Ambulatory Visit (HOSPITAL_BASED_OUTPATIENT_CLINIC_OR_DEPARTMENT_OTHER): Payer: Self-pay

## 2020-08-08 ENCOUNTER — Ambulatory Visit: Payer: Medicare Other | Attending: Internal Medicine

## 2020-08-08 ENCOUNTER — Other Ambulatory Visit: Payer: Self-pay

## 2020-08-08 DIAGNOSIS — Z23 Encounter for immunization: Secondary | ICD-10-CM

## 2020-08-08 MED ORDER — PFIZER-BIONT COVID-19 VAC-TRIS 30 MCG/0.3ML IM SUSP
INTRAMUSCULAR | 0 refills | Status: DC
  Filled 2020-08-08: qty 0.3, 1d supply, fill #0

## 2020-08-08 NOTE — Progress Notes (Signed)
   Covid-19 Vaccination Clinic  Name:  [name]    MRN: [id number] DOB: 18-Dec-1940  08/08/2020  [name] was observed post Covid-19 immunization for 15 minutes without incident. She was provided with Vaccine Information Sheet and instruction to access the V-Safe system.   [name] was instructed to call 911 with any severe reactions post vaccine: Marland Kitchen Difficulty breathing  . Swelling of face and throat  . A fast heartbeat  . A bad rash all over body  . Dizziness and weakness   Immunizations Administered    Name Date Dose VIS Date Route   PFIZER Comrnaty(Gray TOP) Covid-19 Vaccine 08/08/2020  2:23 PM 0.3 mL 02/24/2020 Intramuscular   Manufacturer: [employer], [employer]   Lot: [id number]   NDC: [phone]

## 2020-08-25 DIAGNOSIS — Z1212 Encounter for screening for malignant neoplasm of rectum: Secondary | ICD-10-CM | POA: Diagnosis not present

## 2020-09-05 DIAGNOSIS — H903 Sensorineural hearing loss, bilateral: Secondary | ICD-10-CM | POA: Diagnosis not present

## 2020-09-11 ENCOUNTER — Encounter: Payer: Self-pay | Admitting: Family

## 2020-09-11 ENCOUNTER — Other Ambulatory Visit: Payer: Self-pay

## 2020-09-11 ENCOUNTER — Ambulatory Visit (INDEPENDENT_AMBULATORY_CARE_PROVIDER_SITE_OTHER): Payer: Medicare Other | Admitting: Family

## 2020-09-11 VITALS — BP 136/90 | HR 80 | Ht 66.0 in | Wt 216.8 lb

## 2020-09-11 DIAGNOSIS — I10 Essential (primary) hypertension: Secondary | ICD-10-CM | POA: Diagnosis not present

## 2020-09-11 DIAGNOSIS — I25118 Atherosclerotic heart disease of native coronary artery with other forms of angina pectoris: Secondary | ICD-10-CM

## 2020-09-11 DIAGNOSIS — Z7901 Long term (current) use of anticoagulants: Secondary | ICD-10-CM | POA: Diagnosis not present

## 2020-09-11 DIAGNOSIS — I6523 Occlusion and stenosis of bilateral carotid arteries: Secondary | ICD-10-CM

## 2020-09-11 DIAGNOSIS — E785 Hyperlipidemia, unspecified: Secondary | ICD-10-CM

## 2020-09-11 MED ORDER — EZETIMIBE 10 MG PO TABS: 10.0000 mg | ORAL_TABLET | Freq: Every day | ORAL | 11 refills | Status: DC

## 2020-09-11 MED ORDER — METOPROLOL SUCCINATE ER 50 MG PO TB24: 75.0000 mg | ORAL_TABLET | Freq: Every day | ORAL | 3 refills | Status: DC

## 2020-09-11 NOTE — Progress Notes (Signed)
Office Visit    Patient Name: [name] Date of Encounter: 09/11/2020  PCP:  Burnard Bunting, MD   [place]  Cardiologist:  Larae Grooms, MD  Advanced Practice Provider:  No care team member to display Electrophysiologist:  None    Chief Complaint    [name] is a 80 y.o. female with a hx of CAD s/p inferior posterior STEMI June 2015 treated with BMS to circumflex, hyperlipidemia, hypertension, recurrent nonsustained VT after catheterization, persistent DOE, atrial flutter presents today for follow up of CAD.    Past Medical History    Past Medical History:  Diagnosis Date   Acute inferolateral myocardial infarction ([place]) 03/24/2013   Acute MI, inferoposterior wall, initial episode of care ([place]) 03/28/2013   Acute myocardial infarction of other inferior wall, initial episode of care    Amnesia, global, transient    Arthritis    needs left knee replacement   CAD (coronary artery disease)    a. 03/2013 Infpost STEMI/PCI: LM nl, LAD min irregs, LCX 100 (3.0x12 Vision BMS), RCA  mild ostial dzs, EF 55%.   Essential hypertension, benign    Fibroids    a. uterine - spotting noted since 03/11/2013.   GERD 11/19/2007   Qualifier: Diagnosis of  By: Henrene Pastor MD, Docia Chuck    GERD (gastroesophageal reflux disease)    Hearing loss    a. s/p cochlear implant on right, hearing aid on left.   Hernia of anterior abdominal wall    History of cardiovascular stress test    Lexiscan Myoview 8/16:  EF 58%, inferolateral scar, no ischemia; Low Risk   Hyperlipidemia    Hypothyroidism    Ischemic cardiomyopathy    Echo 7/16:  Septal and posterior lateral HK, EF 45-50%, mild LAE   IV Contrast Allergy    Near syncope 06/26/2014   Obesity    Old MI (myocardial infarction) 03/03/2014   Retrograde amnesia 06/26/2014   Rosacea    Ventricular tachycardia ([place])    a. Immediate post pci/MI - no complications, on BB.   Past Surgical History:  Procedure  Laterality Date   CERVICAL POLYPECTOMY     2010   CHOLECYSTECTOMY     COCHLEAR IMPLANT     LEFT HEART CATHETERIZATION WITH CORONARY ANGIOGRAM N/A 03/25/2013   Procedure: LEFT HEART CATHETERIZATION WITH CORONARY ANGIOGRAM;  Surgeon: Burnell Blanks, MD;  Location: [facility] CATH LAB;  Service: Cardiovascular;  Laterality: N/A;   ROBOTIC ASSISTED TOTAL HYSTERECTOMY WITH BILATERAL SALPINGO OOPHERECTOMY Bilateral 08/16/2014   Procedure: ROBOTIC ASSISTED TOTAL HYSTERECTOMY WITH BILATERAL SALPINGO OOPHORECTOMY ;  Surgeon: Everitt Amber, MD;  Location: [facility] ORS;  Service: Gynecology;  Laterality: Bilateral;   UPPER GASTROINTESTINAL ENDOSCOPY     uterine polyp     had D and C done to remove the polyp    Allergies  Allergies  Allergen Reactions   Bee Venom Itching and Swelling   Contrast Media [Iodinated Diagnostic Agents] Hives    dye   Fish Allergy Anaphylaxis    Shell fish   Iohexol Hives    History of Present Illness    [name] is a 80 y.o. female with a hx of CAD s/p inferior posterior STEMI June 2015 treated with BMS to circumflex, hyperlipidemia, hypertension, recurrent nonsustained VT after catheterization, persistent DOE, atrial flutter last seen 01/03/2020 by Dr. Irish Lack.  She had cardiac catheterization January 2015 in the setting of inferior posterior STEMI with BMS to left circumflex.  Post  PCI she had recurrent nonsustained ventricular tachycardia.  Repeat cardiac catheterization with patent circumflex stent.  She has had persistent dyspnea since her MI.  Echo 06/2013 normal LVEF.  PFTs in 2015 unremarkable.  She was admitted April 2016 with acute blood loss anemia due to profound vaginal bleeding.  Echo with mildly decreased left ventricular function.  Underwent stress test with no ischemia.  She had a hysterectomy in 2016.  She had an ED visit with atrial flutter with 2:1 conduction October 2021.  She converted independently to normal sinus rhythm and was started on  Xarelto.  Echo 12/22/2019 LVEF 60 to 65%, mild LVH, no R WMA, grade 1 diastolic dysfunction, RV normal size and function, LA moderately dilated.  Carotid duplex 10/08-2019 with bilateral 1 to 39% stenosis.  She presents today for follow up. Notes she has had RBC in her stool and has upcoming evaluation with gastroenterology. Notes no chest pain, pressure, tightness. Tells me she is hot natured, sleeps with ceiling fan. She has a stage 4 lung cancer patient who is staying with her which is understandably stressful. He does not like the [facility] as much so she keeps her house warmer than her preference. Feels as if her palpitations have been a bit more frequent in the evening when she wakes up hot. Tells me she cannot tolerate her Rosuvastatin. She feels "like I've been in a car wreck". She makes herself take it once or twice per week. Her most recent lipid panel 07/26/20 showed total cholesterol 160, HDL 44, LDL 100, triglycerides 82. She needs knee replacement in both knees. This limits her mobility. She takes her Lasix every other day. She has mild edema which is overall stable. Her edema is more noticeable when she is up on her feet all day long. Reports no shortness of breath at rest. Tells me her dyspnea on exertion is stable at her baseline.   EKGs/Labs/Other Studies Reviewed:   The following studies were reviewed today:  Echo 12/22/2019 1. Left ventricular ejection fraction, by estimation, is 60 to 65%. The  left ventricle has normal function. The left ventricle has no regional  wall motion abnormalities. There is mild left ventricular hypertrophy.  Left ventricular diastolic parameters  are consistent with Grade I diastolic dysfunction (impaired relaxation).   2. Right ventricular systolic function is normal. The right ventricular  size is normal.   3. Left atrial size was moderately dilated.   4. The mitral valve is normal in structure. No evidence of mitral valve  regurgitation. No evidence of  mitral stenosis.   5. The aortic valve is normal in structure. Aortic valve regurgitation is  not visualized. No aortic stenosis is present.   6. The inferior vena cava is normal in size with greater than 50%  respiratory variability, suggesting right atrial pressure of 3 mmHg.   Carotid duplex 12/2019 Summary:  Right Carotid: Velocities in the right ICA are consistent with a 1-39%  stenosis.   Left Carotid: Velocities in the left ICA are consistent with a 1-39%  stenosis.   Vertebrals: Bilateral vertebral arteries demonstrate antegrade flow Summary:  Right Carotid: Velocities in the right ICA are consistent with a 1-39%  stenosis.   Left Carotid: Velocities in the left ICA are consistent with a 1-39%  stenosis.   Vertebrals: Bilateral vertebral arteries demonstrate antegrade flow   EKG:  No EKG today.  Recent Labs: 12/22/2019: TSH 0.391 12/23/2019: ALT 12; BUN 16; Creatinine, Ser 0.75; Hemoglobin 13.3; Magnesium 2.0; Platelets 173; Potassium  3.7; Sodium 139  Recent Lipid Panel    Component Value Date/Time   CHOL 129 12/23/2019 0823   CHOL 148 09/30/2017 0832   TRIG 69 12/23/2019 0823   HDL 44 12/23/2019 0823   HDL 46 09/30/2017 0832   CHOLHDL 2.9 12/23/2019 0823   VLDL 14 12/23/2019 0823   LDLCALC NOT CALCULATED 12/23/2019 0823   LDLCALC 78 09/30/2017 0832   Home Medications   Current Meds  Medication Sig   Calcium Carb-Cholecalciferol (CALCIUM + D3) 600-200 MG-UNIT TABS Take 1 tablet by mouth every morning.    COVID-19 mRNA Vac-TriS, Pfizer, (PFIZER-BIONT COVID-19 VAC-TRIS) SUSP injection Inject into the muscle.   COVID-19 mRNA vaccine, Pfizer, 30 MCG/0.3ML injection INJECT AS DIRECTED   Cyanocobalamin (VITAMIN B 12 PO) Take 1,000 tablets by mouth daily.   diclofenac sodium (VOLTAREN) 1 % GEL Apply 2 g topically 2 (two) times daily. Applies to knees   ezetimibe (ZETIA) 10 MG tablet Take 1 tablet (10 mg total) by mouth daily.   furosemide (LASIX) 20 MG tablet Take 1  tablet (20 mg total) by mouth daily.   levothyroxine (SYNTHROID, LEVOTHROID) 100 MCG tablet Take 100 mcg by mouth daily before breakfast.   lisinopril (ZESTRIL) 40 MG tablet TAKE 1 TABLET ONCE DAILY.   metroNIDAZOLE (METROGEL) 0.75 % gel Apply 1 application topically daily. Patient places on her cheeks and nose, only about once or twice a week depending on the climate.   NITROSTAT 0.4 MG SL tablet DISSOLVE 1 TABLET UNDER TONGUE AS NEEDED FOR CHEST PAIN,MAY REPEAT IN5 MINUTES FOR 2 DOSES.   pantoprazole (PROTONIX) 40 MG tablet TAKE 1 TABLET ONCE DAILY.   potassium chloride SA (KLOR-CON) 20 MEQ tablet Take 1 tablet (20 mEq total) by mouth daily.   rivaroxaban (XARELTO) 20 MG TABS tablet Take 1 tablet (20 mg total) by mouth daily with supper.   rosuvastatin (CRESTOR) 20 MG tablet Take 10 mg by mouth 2 (two) times a week.    [DISCONTINUED] metoprolol succinate (TOPROL-XL) 50 MG 24 hr tablet Take 1 tablet (50 mg total) by mouth daily. Take with or immediately following a meal.     Review of Systems    All other systems reviewed and are otherwise negative except as noted above.  Physical Exam    VS:  BP 136/90   Pulse 80   Ht 5\' 6"  (1.676 m)   Wt 216 lb 12.8 oz (98.3 kg)   SpO2 96%   BMI 34.99 kg/m  , BMI Body mass index is 34.99 kg/m.  Wt Readings from Last 3 Encounters:  09/11/20 216 lb 12.8 oz (98.3 kg)  07/25/20 220 lb (99.8 kg)  01/25/20 221 lb (100.2 kg)    GEN: Well nourished, well developed, in no acute distress. HEENT: normal. Neck: Supple, no JVD, carotid bruits, or masses. Cardiac: RRR, no murmurs, rubs, or gallops. No clubbing, cyanosis, edema.  Radials/PT 2+ and equal bilaterally.  Respiratory:  Respirations regular and unlabored, clear to auscultation bilaterally. GI: Soft, nontender, nondistended. MS: No deformity or atrophy. Skin: Warm and dry, no rash. Neuro:  Strength and sensation are intact. Psych: Normal affect.  Assessment & Plan    CAD - Stable dyspnea.  No chest pain. GDMT includes Metoprolol, Rosuvastatin. No Aspirin due to chronic anticoagulation. Heart healthy diet and regular cardiovascular exercise encouraged.   Carotid stenosis - 12/2019 bilateral 1-39% stenosis. Continue statin. Add Zetia, as below.   Hypertensive heart disease - BP mildly elevated. 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 Increase Toprol to 75mg  QD. Continue Lisinopril 40mg  QD. Continue Lasix 20mg  QOD. Reports edema well controlled. No orthopnea, PND.   HLD, LDL goal <70 - Continue Crestor 20mg  twice per week. Does not tolerate more frequently due to myalgias. Start Zetia 10mg  daily as most recent LDL 100 with goal of <70.   Atrial flutter / Chronic anticoagulation - Reports rare palpitations. Increase Toprol to 75 mg daily. If palpitations persist, consider repeat ZIO monitor and/or referral to EP. Continue Xarelto 20mg  daily due to CHADS2VASc of at least 5. Recent stool card by PCP with red blood cells present - has upcoming evaluation with GI. Discussed that GI would send note to our office in regards to holding Xarelto if colonoscopy scheduled. From cardiac perspective, may proceed with colonoscopy without additional cardiovascular testing.   Disposition: Follow up in 6 month(s) with Dr. Irish Lack or APP   Signed, Loel Dubonnet, NP 09/11/2020, 4:46 PM [place]

## 2020-09-11 NOTE — Patient Instructions (Addendum)
Medication Instructions:  Your physician has recommended you make the following change in your medication:  CHANGE Metoprolol Succinate to 1.5 tablets (87m) one time daily  START Ezetimibe (Zetia) one 133mtablet daily  This will help to lower your LDL "bad cholesterol" to lower your risk of plaque buildup in your stent. This targets you LDL "bad cholesterol" but does not cause muscle pains.   CONTINUE Rosuvastatin (Crestor) once or twice per week as tolerated  *If you need a refill on your cardiac medications before your next appointment, please call your pharmacy*  Lab Work: None ordered today.   Testing/Procedures: None ordered today.   Follow-Up: [facility] are our [facility].  As part of our continuing mission to provide you with exceptional heart care, we have created designated Provider Care Teams.  These Care Teams include your primary Cardiologist (physician) and Advanced Practice Providers (APPs -  Physician Assistants and Nurse Practitioners) who all work together to provide you with the care you need, when you need it.  We recommend signing up for the patient portal called "MyChart".  Sign up information is provided on this After Visit Summary.  MyChart is used to connect with patients for Virtual Visits (Telemedicine).  Patients are able to view lab/test results, encounter notes, upcoming appointments, etc.  Non-urgent messages can be sent to your provider as well.   To learn more about what you can do with MyChart, go to [email or web address]   Your next appointment:   6 month(s)  The format for your next appointment:   In Person  Provider:   You may see JaLarae GroomsMD or one of the following Advanced Practice Providers on your designated Care Team:   DaMelina CopaPA-C MiErmalinda BarriosPA-C   Other Instructions  To prevent palpitations: Make sure you are adequately hydrated.  Avoid and/or limit caffeine containing  beverages like soda or tea. Exercise regularly.  Manage stress well. Some over the counter medications can cause palpitations such as Benadryl, AdvilPM, TylenolPM. Regular Advil or Tylenol do not cause palpitations.   Heart Healthy Diet Recommendations: A low-salt diet is recommended. Meats should be grilled, baked, or boiled. Avoid fried foods. Focus on lean protein sources like fish or chicken with vegetables and fruits. The American Heart Association is a GRMicrobiologist American Heart Association Diet and Lifeystyle Recommendations   Exercises to do While Sitting  Exercises that you do while sitting (chair exercises) can give you many of the same benefits as full exercise. Benefits include strengthening your heart, burning calories, and keeping muscles and joints healthy. Exercise can also improve your mood and help with depression andanxiety. You may benefit from chair exercises if you are unable to do standing exercises because of: Diabetic foot pain. Obesity. Illness. Arthritis. Recovery from surgery or injury. Breathing problems. Balance problems. Another type of disability. Before starting chair exercises, check with your health care provider or a physical therapist to find out how much exercise you can tolerate and which exercises are safe for you. If your health care provider approves: Start out slowly and build up over time. Aim to work up to about 10-20 minutes for each exercise session. Make exercise part of your daily routine. Drink water when you exercise. Do not wait until you are thirsty. Drink every 10-15 minutes. Stop exercising right away if you have pain, nausea, shortness of breath, or dizziness. If you are exercising in a wheelchair, make sure to lock the wheels.  Ask your health care provider whether you can do tai chi or yoga. Many positions in these mind-body exercises can be modified to do while seated. Warm-up Before starting other exercises: Sit up as  straight as you can. Have your knees bent at 90 degrees, which is the shape of the capital letter "L." Keep your feet flat on the floor. Sit at the front edge of your chair, if you can. Pull in (tighten) the muscles in your abdomen and stretch your spine and neck as straight as you can. Hold this position for a few minutes. Breathe in and out evenly. Try to concentrate on your breathing, and relax your mind. Stretching Exercise A: Arm stretch Hold your arms out straight in front of your body. Bend your hands at the wrist with your fingers pointing up, as if signaling someone to stop. Notice the slight tension in your forearms as you hold the position. Keeping your arms out and your hands bent, rotate your hands outward as far as you can and hold this stretch. Aim to have your thumbs pointing up and your pinkie fingers pointing down. Slowly repeat arm stretches for one minute as tolerated. Exercise B: Leg stretch If you can move your legs, try to "draw" letters on the floor with the toes of your foot. Write your name with one foot. Write your name with the toes of your other foot. Slowly repeat the movements for one minute as tolerated. Exercise C: Reach for the sky Reach your hands as far over your head as you can to stretch your spine. Move your hands and arms as if you are climbing a rope. Slowly repeat the movements for one minute as tolerated. Range of motion exercises Exercise A: Shoulder roll Let your arms hang loosely at your sides. Lift just your shoulders up toward your ears, then let them relax back down. When your shoulders feel loose, rotate your shoulders in backward and forward circles. Do shoulder rolls slowly for one minute as tolerated. Exercise B: March in place As if you are marching, pump your arms and lift your legs up and down. Lift your knees as high as you can. If you are unable to lift your knees, just pump your arms and move your ankles and feet up and down. March  in place for one minute as tolerated. Exercise C: Seated jumping jacks Let your arms hang down straight. Keeping your arms straight, lift them up over your head. Aim to point your fingers to the ceiling. While you lift your arms, straighten your legs and slide your heels along the floor to your sides, as wide as you can. As you bring your arms back down to your sides, slide your legs back together. If you are unable to use your legs, just move your arms. Slowly repeat seated jumping jacks for one minute as tolerated. Strengthening exercises Exercise A: Shoulder squeeze Hold your arms straight out from your body to your sides, with your elbows bent and your fists pointed at the ceiling. Keeping your arms in the bent position, move them forward so your elbows and forearms meet in front of your face. Open your arms back out as wide as you can with your elbows still bent, until you feel your shoulder blades squeezing together. Hold for 5 seconds. Slowly repeat the movements forward and backward for one minute as tolerated. Contact a health care provider if you: Had to stop exercising due to any of the following: Pain. Nausea. Shortness of breath.  Dizziness. Fatigue. Have significant pain or soreness after exercising. Get help right away if you have: Chest pain. Difficulty breathing. These symptoms may represent a serious problem that is an emergency. Do not wait to see if the symptoms will go away. Get medical help right away. Call your local emergency services (911 in the [place] Do not drive yourself to the hospital. This information is not intended to replace advice given to you by your health care provider. Make sure you discuss any questions you have with your healthcare provider. Document Revised: 06/14/2019 Document Reviewed: 07/01/2019 Elsevier Patient Education  2022 [employer].

## 2020-10-12 ENCOUNTER — Ambulatory Visit (INDEPENDENT_AMBULATORY_CARE_PROVIDER_SITE_OTHER): Payer: Medicare Other | Admitting: Internal Medicine

## 2020-10-12 ENCOUNTER — Telehealth: Payer: Self-pay

## 2020-10-12 ENCOUNTER — Encounter: Payer: Self-pay | Admitting: Internal Medicine

## 2020-10-12 VITALS — BP 122/77 | HR 65 | Ht 66.0 in | Wt 218.0 lb

## 2020-10-12 DIAGNOSIS — Z86010 Personal history of colonic polyps: Secondary | ICD-10-CM

## 2020-10-12 DIAGNOSIS — I6523 Occlusion and stenosis of bilateral carotid arteries: Secondary | ICD-10-CM | POA: Diagnosis not present

## 2020-10-12 DIAGNOSIS — Z7901 Long term (current) use of anticoagulants: Secondary | ICD-10-CM | POA: Diagnosis not present

## 2020-10-12 DIAGNOSIS — R195 Other fecal abnormalities: Secondary | ICD-10-CM

## 2020-10-12 MED ORDER — PEG 3350-KCL-NA BICARB-NACL 420 G PO SOLR: 4000.0000 mL | Freq: Once | ORAL | 0 refills | Status: AC

## 2020-10-12 NOTE — Telephone Encounter (Signed)
OK to hold Xarelto for 2 days prior to surgery.  Jettie Booze, MD

## 2020-10-12 NOTE — Telephone Encounter (Signed)
Patient with diagnosis of PAF on Xarelto for anticoagulation.    Procedure: colonoscopy Date of procedure: 11/29/20  CHA2DS2-VASc Score = 7  This indicates a 11.2% annual risk of stroke. The patient's score is based upon: CHF History: No HTN History: Yes Diabetes History: No Stroke History: Yes Vascular Disease History: Yes Age Score: 2 Gender Score: 1   CrCl 73 mL/min using adjusted body weight Platelet count 173K  Patient at elevated risk off anticoagulation. TIA in October 2021.  Will route to Dr Irish Lack to see if ok to hold for 2 days

## 2020-10-12 NOTE — Patient Instructions (Signed)
If you are age 78 or older, your body mass index should be between 23-30. Your Body mass index is 35.19 kg/m. If this is out of the aforementioned range listed, please consider follow up with your Primary Care Provider. __________________________________________________________  The [facility] providers would like to encourage you to use [facility] to communicate with providers for non-urgent requests or questions.  Due to long hold times on the telephone, sending your provider a message by [facility] may be a faster and more efficient way to get a response.  Please allow 48 business hours for a response.  Please remember that this is for non-urgent requests.   You have been scheduled for a colonoscopy. Please follow written instructions given to you at your visit today.  Please pick up your prep supplies at the pharmacy within the next 1-3 days. If you use inhalers (even only as needed), please bring them with you on the day of your procedure.  Due to recent changes in healthcare laws, you may see the results of your imaging and laboratory studies on MyChart before your provider has had a chance to review them.  We understand that in some cases there may be results that are confusing or concerning to you. Not all laboratory results come back in the same time frame and the provider may be waiting for multiple results in order to interpret others.  Please give [place] 48 hours in order for your provider to thoroughly review all the results before contacting the office for clarification of your results.   You will be contacted by our office prior to your procedure for directions on holding your Xarelto.  If you do not hear from our office 1 week prior to your scheduled procedure, please call [phone] to discuss.   Thank you for entrusting me with your care and choosing [facility].  Dr Ardis Hughs

## 2020-10-12 NOTE — Telephone Encounter (Signed)
   Primary Cardiologist: Larae Grooms, MD  Chart reviewed as part of pre-operative protocol coverage by clinical pharmacist. [name] has received the following recommendations.  Patient with diagnosis of PAF on Xarelto for anticoagulation.     Procedure: colonoscopy Date of procedure: 11/29/20   CHA2DS2-VASc Score = 7  This indicates a 11.2% annual risk of stroke. The patient's score is based upon: CHF History: No HTN History: Yes Diabetes History: No Stroke History: Yes Vascular Disease History: Yes Age Score: 2 Gender Score: 1   CrCl 73 mL/min using adjusted body weight Platelet count 173K   Patient at elevated risk off anticoagulation. TIA in October 2021.  She may hold her Xarelto for 2 days prior to her surgery.  Please resume as soon as hemostasis is achieved.  I will route this recommendation to the requesting party via Epic fax function and remove from pre-op pool.  Please call with questions.  Jossie Ng. Quinzell Malcomb NP-C    10/12/2020, 4:28 PM [address] Office [phone] Fax [phone]

## 2020-10-12 NOTE — Telephone Encounter (Signed)
[facility] Pre-operative Risk Assessment     Request for surgical clearance:     Endoscopy Procedure  What type of surgery is being performed?     Colonoscopy  When is this surgery scheduled?     11-29-2020  What type of clearance is required ?   Pharmacy  Are there any medications that need to be held prior to surgery and how long? Xarelto x 2 days  Practice name and name of physician performing surgery?      [facility]  What is your office phone and fax number?      Phone- [phone]  [phone]  Anesthesia type (None, local, MAC, general) ?       MAC

## 2020-10-13 NOTE — Telephone Encounter (Signed)
Patient advised that she has been given clearance to hold Xarelto 2 days prior to colonoscopy scheduled for 11-29-2020.  Patient advised to take last dose of Xarelto on 11-27-2020, and she will be advised when to restart Xarelto by Dr Henrene Pastor after the procedure.  Patient agreed to plan and verbalized understanding.  No further questions.

## 2020-10-17 ENCOUNTER — Encounter: Payer: Self-pay | Admitting: Internal Medicine

## 2020-10-17 NOTE — Progress Notes (Signed)
HISTORY OF PRESENT ILLNESS:  [name] is a 80 y.o. female with multiple significant medical problems as listed below including, but not limited to, coronary artery disease with prior myocardial infarction, coronary artery intervention with bare-metal stent placement 2015, nonsustained ventricular tachycardia and atrial flutter/on chronic anticoagulation therapy in the form of Xarelto.  She also has a history of GERD and multiple adenomatous colon polyps for which she has been seen in this office remotely.  She is sent today by her primary care provider, Dr. Reynaldo Minium, regarding Hemoccult positive stool on separate occasions.  For the patient's chronic reflux disease,, she is maintained on pantoprazole 40 mg daily.  This controls reflux symptoms.  No dysphagia.  She did undergo upper endoscopy September 2009.  This was normal.  She denies any lower GI complaints.  She did undergo colonoscopy November 2012.  She was found to have 7 polyps which were tubular adenomas.  As well, moderate sigmoid diverticulosis.  Follow-up colonoscopy in 3 years recommended.  The patient did receive a recall notification letter at the appropriate time, but did not respond.  She was seen by cardiology September 11, 2020.  They are aware that she was planning for endoscopic procedures and endorsed temporary interruption of her Xarelto therapy.  Her primary cardiologist is Dr. Irish Lack.  Last ejection fraction 60 to 65%.  Review prior abdominal and pelvic CT scan 2016 revealed cystic abnormality in the left adnexa, small fat-containing umbilical hernia, and large stool burden.  REVIEW OF SYSTEMS:  All non-GI ROS negative unless otherwise stated in the HPI except for lower extremity swelling  Past Medical History:  Diagnosis Date   Acute inferolateral myocardial infarction ([place]) 03/24/2013   Acute MI, inferoposterior wall, initial episode of care ([place]) 03/28/2013   Acute myocardial infarction of other inferior wall, initial  episode of care    Amnesia, global, transient    Arthritis    needs left knee replacement   CAD (coronary artery disease)    a. 03/2013 Infpost STEMI/PCI: LM nl, LAD min irregs, LCX 100 (3.0x12 Vision BMS), RCA  mild ostial dzs, EF 55%.   Essential hypertension, benign    Fibroids    a. uterine - spotting noted since 03/11/2013.   GERD 11/19/2007   Qualifier: Diagnosis of  By: Henrene Pastor MD, Docia Chuck    GERD (gastroesophageal reflux disease)    Hearing loss    a. s/p cochlear implant on right, hearing aid on left.   Hernia of anterior abdominal wall    History of cardiovascular stress test    Lexiscan Myoview 8/16:  EF 58%, inferolateral scar, no ischemia; Low Risk   Hyperlipidemia    Hypothyroidism    Ischemic cardiomyopathy    Echo 7/16:  Septal and posterior lateral HK, EF 45-50%, mild LAE   IV Contrast Allergy    Near syncope 06/26/2014   Obesity    Old MI (myocardial infarction) 03/03/2014   Retrograde amnesia 06/26/2014   Rosacea    Ventricular tachycardia ([place])    a. Immediate post pci/MI - no complications, on BB.    Past Surgical History:  Procedure Laterality Date   CERVICAL POLYPECTOMY     2010   CHOLECYSTECTOMY     COCHLEAR IMPLANT     LEFT HEART CATHETERIZATION WITH CORONARY ANGIOGRAM N/A 03/25/2013   Procedure: LEFT HEART CATHETERIZATION WITH CORONARY ANGIOGRAM;  Surgeon: Burnell Blanks, MD;  Location: [facility] CATH LAB;  Service: Cardiovascular;  Laterality: N/A;   ROBOTIC ASSISTED TOTAL HYSTERECTOMY WITH BILATERAL  SALPINGO OOPHERECTOMY Bilateral 08/16/2014   Procedure: ROBOTIC ASSISTED TOTAL HYSTERECTOMY WITH BILATERAL SALPINGO OOPHORECTOMY ;  Surgeon: Everitt Amber, MD;  Location: [facility] ORS;  Service: Gynecology;  Laterality: Bilateral;   UPPER GASTROINTESTINAL ENDOSCOPY     uterine polyp     had D and C done to remove the polyp    Social History [name]  reports that she quit smoking about 52 years ago. Her smoking use included cigarettes. She has a 0.40  pack-year smoking history. She has never used smokeless tobacco. She reports that she does not drink alcohol and does not use drugs.  family history includes Cancer in her mother; Colon cancer in her cousin; Diabetes in her mother and sister; Heart attack in her maternal grandmother and mother; Heart disease in her mother; Heart failure in her mother; Hyperlipidemia in her sister; Hypertension in her mother; Other in her father.  Allergies  Allergen Reactions   Bee Venom Itching and Swelling   Contrast Media [Iodinated Diagnostic Agents] Hives    dye   Fish Allergy Anaphylaxis    Shell fish   Iohexol Hives       PHYSICAL EXAMINATION: Vital signs: BP 122/77   Pulse 65   Ht '5\' 6"'$  (1.676 m)   Wt 218 lb (98.9 kg)   SpO2 97%   BMI 35.19 kg/m   Constitutional: generally well-appearing, no acute distress Psychiatric: alert and oriented x3, cooperative Eyes: extraocular movements intact, anicteric, conjunctiva pink Mouth: oral pharynx moist, no lesions Neck: supple no lymphadenopathy Cardiovascular: heart regular rate and rhythm, no murmur Lungs: clear to auscultation bilaterally Abdomen: soft, nontender, nondistended, no obvious ascites, no peritoneal signs, normal bowel sounds, no organomegaly Rectal: Deferred until colonoscopy Extremities: no no clubbing or cyanosis.  Trace lower extremity edema bilaterally Skin: no lesions on visible extremities Neuro: No focal deficits.  Cranial nerves intact  ASSESSMENT:  1.  Hemoccult positive stool.  Rule out underlying GI mucosal lesion 2.  History of multiple adenomatous colon polyps on colonoscopy 2012.  Overdue for follow-up 3.  Chronic GERD.  Symptoms controlled on PPI.  Normal EGD 2009 4.  Multiple significant medical problems including coronary artery disease and atrial arrhythmia on chronic anticoagulation therapy   PLAN:  1.  Schedule colonoscopy.  The patient is HIGH RISK given her comorbidities and the need to address chronic  anticoagulation therapy.The nature of the procedure, as well as the risks, benefits, and alternatives were carefully and thoroughly reviewed with the patient. Ample time for discussion and questions allowed. The patient understood, was satisfied, and agreed to proceed.  2.  Hold Xarelto 1 day prior to the procedure (normal GFR).  Will confirm with cardiology. 3.  Reflux precautions 4.  Continue PPI 5.  Ongoing general medical care with Dr. Reynaldo Minium A total time of 60 minutes was spent preparing to see the patient, reviewing outside laboratories, x-rays, and endoscopy reports.  Performing comprehensive history and comprehensive physical examination.  Counseling the patient regarding her multiple above listed issues.  Corresponding with her cardiologist.  Ordering advanced endoscopic procedures.  Finally, documenting clinical information in the health record

## 2020-10-29 ENCOUNTER — Other Ambulatory Visit: Payer: Self-pay | Admitting: Interventional Cardiology

## 2020-10-30 NOTE — Telephone Encounter (Signed)
Refill sent to pharmacy.   

## 2020-11-29 ENCOUNTER — Encounter: Payer: Medicare Other | Admitting: Internal Medicine

## 2021-01-01 ENCOUNTER — Ambulatory Visit: Payer: Medicare Other | Attending: Internal Medicine

## 2021-01-01 ENCOUNTER — Other Ambulatory Visit: Payer: Self-pay

## 2021-01-01 ENCOUNTER — Other Ambulatory Visit (HOSPITAL_BASED_OUTPATIENT_CLINIC_OR_DEPARTMENT_OTHER): Payer: Self-pay

## 2021-01-01 DIAGNOSIS — Z23 Encounter for immunization: Secondary | ICD-10-CM

## 2021-01-01 MED ORDER — PFIZER COVID-19 VAC BIVALENT 30 MCG/0.3ML IM SUSP
INTRAMUSCULAR | 0 refills | Status: DC
  Filled 2021-01-01: qty 0.3, 1d supply, fill #0

## 2021-01-01 NOTE — Progress Notes (Signed)
   Covid-19 Vaccination Clinic  Name:  [name]    MRN: [id number] DOB: April 18, 1940  01/01/2021  [name] was observed post Covid-19 immunization for 15 minutes without incident. She was provided with Vaccine Information Sheet and instruction to access the V-Safe system.   [name] was instructed to call 911 with any severe reactions post vaccine: Difficulty breathing  Swelling of face and throat  A fast heartbeat  A bad rash all over body  Dizziness and weakness

## 2021-01-06 DIAGNOSIS — Z23 Encounter for immunization: Secondary | ICD-10-CM | POA: Diagnosis not present

## 2021-01-25 ENCOUNTER — Encounter: Payer: Self-pay | Admitting: Internal Medicine

## 2021-01-25 ENCOUNTER — Ambulatory Visit (AMBULATORY_SURGERY_CENTER): Payer: Medicare Other | Admitting: Internal Medicine

## 2021-01-25 VITALS — BP 140/62 | HR 70 | Temp 98.0°F | Resp 14 | Ht 66.0 in | Wt 218.0 lb

## 2021-01-25 DIAGNOSIS — K5730 Diverticulosis of large intestine without perforation or abscess without bleeding: Secondary | ICD-10-CM

## 2021-01-25 DIAGNOSIS — R195 Other fecal abnormalities: Secondary | ICD-10-CM | POA: Diagnosis not present

## 2021-01-25 DIAGNOSIS — Z86010 Personal history of colonic polyps: Secondary | ICD-10-CM | POA: Diagnosis not present

## 2021-01-25 DIAGNOSIS — D122 Benign neoplasm of ascending colon: Secondary | ICD-10-CM | POA: Diagnosis not present

## 2021-01-25 DIAGNOSIS — D123 Benign neoplasm of transverse colon: Secondary | ICD-10-CM

## 2021-01-25 DIAGNOSIS — D120 Benign neoplasm of cecum: Secondary | ICD-10-CM | POA: Diagnosis not present

## 2021-01-25 MED ORDER — SODIUM CHLORIDE 0.9 % IV SOLN: 500.0000 mL | Freq: Once | INTRAVENOUS | Status: AC

## 2021-01-25 NOTE — Op Note (Signed)
[facility] Patient Name: [name] Procedure Date: 01/25/2021 1:44 PM MRN: [id number] Endoscopist: Docia Chuck. Henrene Pastor , MD Age: 80 Referring MD:  Date of Birth: 01-21-1941 Gender: Female Account #: [id number] Procedure:                Colonoscopy with cold snare polypectomy x 3; with                            biopsies Indications:              Heme positive stool Medicines:                Monitored Anesthesia Care Procedure:                Pre-Anesthesia Assessment:                           - Prior to the procedure, a History and Physical                            was performed, and patient medications and                            allergies were reviewed. The patient's tolerance of                            previous anesthesia was also reviewed. The risks                            and benefits of the procedure and the sedation                            options and risks were discussed with the patient.                            All questions were answered, and informed consent                            was obtained. Prior Anticoagulants: The patient has                            taken Xarelto (rivaroxaban), last dose was 3 days                            prior to procedure. ASA Grade Assessment: III - A                            patient with severe systemic disease. After                            reviewing the risks and benefits, the patient was                            deemed in satisfactory condition to undergo the  procedure.                           After obtaining informed consent, the colonoscope                            was passed under direct vision. Throughout the                            procedure, the patient's blood pressure, pulse, and                            oxygen saturations were monitored continuously. The                            CF [id number] #[id number] was introduced through the anus                             and advanced to the the cecum, identified by                            appendiceal orifice and ileocecal valve. The                            ileocecal valve, appendiceal orifice, and rectum                            were photographed. The quality of the bowel                            preparation was excellent. The colonoscopy was                            performed without difficulty. The patient tolerated                            the procedure well. The bowel preparation used was                            SUPREP via split dose instruction. Scope In: 2:02:14 PM Scope Out: 2:21:03 PM Scope Withdrawal Time: 0 hours 14 minutes 5 seconds  Total Procedure Duration: 0 hours 18 minutes 49 seconds  Findings:                 A 1 mm polyp was found in the cecum. The polyp was                            removed with a jumbo cold forceps. Resection and                            retrieval were complete.                           Three polyps were found in the transverse colon and  ascending colon. The polyps were 4 to 6 mm in size.                            These polyps were removed with a cold snare.                            Resection and retrieval were complete.                           Multiple diverticula were found in the left colon.                           The exam was otherwise without abnormality on                            direct and retroflexion views. Complications:            No immediate complications. Estimated blood loss:                            None. Estimated Blood Loss:     Estimated blood loss: none. Impression:               - One 1 mm polyp in the cecum, removed with a jumbo                            cold forceps. Resected and retrieved.                           - Three 4 to 6 mm polyps in the transverse colon                            and in the ascending colon, removed with a cold                            snare. Resected  and retrieved.                           - Diverticulosis in the left colon.                           - The examination was otherwise normal on direct                            and retroflexion views. Recommendation:           - Repeat colonoscopy is not recommended for                            surveillance (age, findings, comorbidities).                           - Resume Xarelto (rivaroxaban) today at prior dose.                           -  Patient has a contact number available for                            emergencies. The signs and symptoms of potential                            delayed complications were discussed with the                            patient. Return to normal activities tomorrow.                            Written discharge instructions were provided to the                            patient.                           - Resume previous diet.                           - Continue present medications.                           - Await pathology results.                           - Do NOT recommend stool Hemoccult studies as part                            of the patient's routine annual assessment [name] N. Henrene Pastor, MD 01/25/2021 2:29:54 PM This report has been signed electronically.

## 2021-01-25 NOTE — Progress Notes (Signed)
Sedate, gd SR, tolerated procedure well, VSS, report to RN 

## 2021-01-25 NOTE — Progress Notes (Signed)
Called to room to assist during endoscopic procedure.  Patient ID and intended procedure confirmed with present staff. Received instructions for my participation in the procedure from the performing physician.  

## 2021-01-25 NOTE — Progress Notes (Signed)
HISTORY OF PRESENT ILLNESS:  [name] is a 80 y.o. female who was evaluated in the office October 12, 2020 regarding Hemoccult positive stool.  Please see that complete history and physical examination.  She presents today for colonoscopy to evaluate Hemoccult-positive stool.  The patient is on chronic Xarelto therapy.  She has had no interval clinical changes  REVIEW OF SYSTEMS:  All non-GI ROS negative  Past Medical History:  Diagnosis Date   Acute inferolateral myocardial infarction ([place]) 03/24/2013   Acute MI, inferoposterior wall, initial episode of care ([place]) 03/28/2013   Acute myocardial infarction of other inferior wall, initial episode of care    Amnesia, global, transient    Arthritis    needs left knee replacement   Blood transfusion without reported diagnosis    CAD (coronary artery disease)    a. 03/2013 Infpost STEMI/PCI: LM nl, LAD min irregs, LCX 100 (3.0x12 Vision BMS), RCA  mild ostial dzs, EF 55%.   Essential hypertension, benign    Fibroids    a. uterine - spotting noted since 03/11/2013.   GERD 11/19/2007   Qualifier: Diagnosis of  By: Henrene Pastor MD, Docia Chuck    GERD (gastroesophageal reflux disease)    Hearing loss    a. s/p cochlear implant on right, hearing aid on left.   Hernia of anterior abdominal wall    History of cardiovascular stress test    Lexiscan Myoview 8/16:  EF 58%, inferolateral scar, no ischemia; Low Risk   Hyperlipidemia    Hypothyroidism    Ischemic cardiomyopathy    Echo 7/16:  Septal and posterior lateral HK, EF 45-50%, mild LAE   IV Contrast Allergy    Near syncope 06/26/2014   Obesity    Old MI (myocardial infarction) 03/03/2014   Retrograde amnesia 06/26/2014   Rosacea    Ventricular tachycardia    a. Immediate post pci/MI - no complications, on BB.    Past Surgical History:  Procedure Laterality Date   CERVICAL POLYPECTOMY     2010   CHOLECYSTECTOMY     COCHLEAR IMPLANT     LEFT HEART CATHETERIZATION WITH CORONARY  ANGIOGRAM N/A 03/25/2013   Procedure: LEFT HEART CATHETERIZATION WITH CORONARY ANGIOGRAM;  Surgeon: Burnell Blanks, MD;  Location: [facility] CATH LAB;  Service: Cardiovascular;  Laterality: N/A;   ROBOTIC ASSISTED TOTAL HYSTERECTOMY WITH BILATERAL SALPINGO OOPHERECTOMY Bilateral 08/16/2014   Procedure: ROBOTIC ASSISTED TOTAL HYSTERECTOMY WITH BILATERAL SALPINGO OOPHORECTOMY ;  Surgeon: Everitt Amber, MD;  Location: [facility] ORS;  Service: Gynecology;  Laterality: Bilateral;   UPPER GASTROINTESTINAL ENDOSCOPY     uterine polyp     had D and C done to remove the polyp    Social History [name]  reports that she quit smoking about 52 years ago. Her smoking use included cigarettes. She has a 0.40 pack-year smoking history. She has never used smokeless tobacco. She reports that she does not drink alcohol and does not use drugs.  family history includes Cancer in her mother; Colon cancer in her cousin; Diabetes in her mother and sister; Heart attack in her maternal grandmother and mother; Heart disease in her mother; Heart failure in her mother; Hyperlipidemia in her sister; Hypertension in her mother; Other in her father.  Allergies  Allergen Reactions   Bee Venom Itching and Swelling   Contrast Media [Iodinated Diagnostic Agents] Hives    dye   Fish Allergy Anaphylaxis    Shell fish   Iohexol Hives  PHYSICAL EXAMINATION:  Vital signs: BP (!) 189/96   Pulse 74   Temp 98 F (36.7 C) (Temporal)   Ht 5\' 6"  (1.676 m)   Wt 218 lb (98.9 kg)   SpO2 96%   BMI 35.19 kg/m  General: Well-developed, well-nourished, no acute distress HEENT: Sclerae are anicteric, conjunctiva pink. Oral mucosa intact Lungs: Clear Heart: Regular Abdomen: soft, nontender, nondistended, no obvious ascites, no peritoneal signs, normal bowel sounds. No organomegaly. Extremities: No edema Psychiatric: alert and oriented x3. Cooperative     ASSESSMENT:  1.  Hemoccult positive stool 2.  History of  adenomatous colon polyps on colonoscopy 2012.  Overdue for follow-up 3.  Normal EGD 2009   PLAN:   1.  Colonoscopy

## 2021-01-25 NOTE — Patient Instructions (Signed)
Handouts given for diverticulosis and polyps.  RESUME XARELTO TODAY AT YOUR USUAL DOSE.  YOU HAD AN ENDOSCOPIC PROCEDURE TODAY AT [facility]:   Refer to the procedure report that was given to you for any specific questions about what was found during the examination.  If the procedure report does not answer your questions, please call your gastroenterologist to clarify.  If you requested that your care partner not be given the details of your procedure findings, then the procedure report has been included in a sealed envelope for you to review at your convenience later.  YOU SHOULD EXPECT: Some feelings of bloating in the abdomen. Passage of more gas than usual.  Walking can help get rid of the air that was put into your GI tract during the procedure and reduce the bloating. If you had a lower endoscopy (such as a colonoscopy or flexible sigmoidoscopy) you may notice spotting of blood in your stool or on the toilet paper. If you underwent a bowel prep for your procedure, you may not have a normal bowel movement for a few days.  Please Note:  You might notice some irritation and congestion in your nose or some drainage.  This is from the oxygen used during your procedure.  There is no need for concern and it should clear up in a day or so.  SYMPTOMS TO REPORT IMMEDIATELY:  Following lower endoscopy (colonoscopy):  Excessive amounts of blood in the stool  Significant tenderness or worsening of abdominal pains  Swelling of the abdomen that is new, acute  Fever of 100F or higher  For urgent or emergent issues, a gastroenterologist can be reached at any hour by calling [phone]. Do not use MyChart messaging for urgent concerns.    DIET:  We do recommend a small meal at first, but then you may proceed to your regular diet.  Drink plenty of fluids but you should avoid alcoholic beverages for 24 hours.  ACTIVITY:  You should plan to take it easy for the rest of today and you  should NOT DRIVE or use heavy machinery until tomorrow (because of the sedation medicines used during the test).    FOLLOW UP: Our staff will call the number listed on your records Monday following your procedure to check on you and address any questions or concerns that you may have regarding the information given to you following your procedure. If we do not reach you, we will leave a message.  We will attempt to reach you two times.  During this call, we will ask if you have developed any symptoms of COVID 19. If you develop any symptoms (ie: fever, flu-like symptoms, shortness of breath, cough etc.) before then, please call [phone].  If you test positive for Covid 19 in the 2 weeks post procedure, please call and report this information to [place].    If any biopsies were taken you will be contacted by phone or by letter within the next 1-3 weeks.  Please call us at [phone] if you have not heard about the biopsies in 3 weeks.    SIGNATURES/CONFIDENTIALITY: You and/or your care partner have signed paperwork which will be entered into your electronic medical record.  These signatures attest to the fact that that the information above on your After Visit Summary has been reviewed and is understood.  Full responsibility of the confidentiality of this discharge information lies with you and/or your care-partner.

## 2021-01-29 ENCOUNTER — Telehealth: Payer: Self-pay | Admitting: *Deleted

## 2021-01-29 ENCOUNTER — Telehealth: Payer: Self-pay

## 2021-01-29 NOTE — Telephone Encounter (Signed)
Attempted f/u phone call. No answer. Left message. °

## 2021-01-29 NOTE — Telephone Encounter (Signed)
  Follow up Call-  Call back number 01/25/2021  Post procedure Call Back phone  # [phone]  Permission to leave phone message Yes  Some recent data might be hidden     Patient questions:  Do you have a fever, pain , or abdominal swelling? No. Pain Score  0 *  Have you tolerated food without any problems? Yes.    Have you been able to return to your normal activities? Yes.    Do you have any questions about your discharge instructions: Diet   No. Medications  No. Follow up visit  No.  Do you have questions or concerns about your Care? No.  Actions: * If pain score is 4 or above: No action needed, pain <4.  Have you developed a fever since your procedure? no  2.   Have you had an respiratory symptoms (SOB or cough) since your procedure? no  3.   Have you tested positive for COVID 19 since your procedure no  4.   Have you had any family members/close contacts diagnosed with the COVID 19 since your procedure?  no   If yes to any of these questions please route to Joylene John, RN and Joella Prince, RN

## 2021-01-30 ENCOUNTER — Ambulatory Visit (INDEPENDENT_AMBULATORY_CARE_PROVIDER_SITE_OTHER): Payer: Medicare Other | Admitting: Adult Health

## 2021-01-30 ENCOUNTER — Encounter: Payer: Self-pay | Admitting: Adult Health

## 2021-01-30 VITALS — BP 142/92 | HR 62 | Ht 66.0 in | Wt 215.0 lb

## 2021-01-30 DIAGNOSIS — E782 Mixed hyperlipidemia: Secondary | ICD-10-CM

## 2021-01-30 DIAGNOSIS — G454 Transient global amnesia: Secondary | ICD-10-CM

## 2021-01-30 DIAGNOSIS — I10 Essential (primary) hypertension: Secondary | ICD-10-CM | POA: Diagnosis not present

## 2021-01-30 DIAGNOSIS — I4892 Unspecified atrial flutter: Secondary | ICD-10-CM | POA: Diagnosis not present

## 2021-01-30 NOTE — Progress Notes (Signed)
[facility] [address] [place]. [place] [address] [phone]       OFFICE FOLLOW UP NOTE  [name] Date of Birth:  [birth year] Medical Record Number:  [id number]   Reason for Referral:  TIA vs TGA follow up    SUBJECTIVE:   CHIEF COMPLAINT:  Chief Complaint  Patient presents with   Follow-up    Rm 3 alone Pt states she isn't having any problems, doing well and stable.      HPI:   Update 01/28/2021 JM: Returns for 71-month follow-up.  Stable from neurological standpoint. No new or reoccurring stroke/TIA or neurological symptoms.  Compliant on Xarelto and Crestor -denies side effects.  Blood pressure today initially elevated and on recheck 142/92.  Routinely monitors at home and typically stable.  Routinely followed by cardiology and PCP.  No further concerns at this time    History provided for reference purposes only Update 07/25/2020 JM: [name] returns for 68-month follow-up.  Reports she has been doing well since prior visit without any new or reoccurring stroke/TIA symptoms. Compliant on Xarelto and Crestor without associated side effects. Blood pressure today elevated but routinely monitors at home and has been stable.  Plans on getting lab work tomorrow for her yearly physical with PCP and has cardiology follow-up coming up.  No new concerns at this time  Initial visit 01/25/2020 JM: [name] is being seen for hospital follow-up unaccompanied.  She has been stable since discharge without reoccurring confusion episodes or new stroke/TIA symptoms.  Remains on Xarelto without bleeding or bruising.  Remains on Crestor 10mg  twice a week without myalgias.  Blood pressure today 143/74. Monitors at home with improvement and recently 135-140/65-70s.  No concerns at this time.  Stroke admission 12/22/2019 [name] is a 80 y.o. female with history of MI, TGA, hypertension, fibroids status post hysterectomy, obesity w/ a cochlear  implant who was seen in the ED earlier in the evening w/ new dx Aflutter started on rivaroxaban who represented with confusion on 12/22/2019.  Personally reviewed hospitalization pertinent progress notes, lab work and imaging with summary provided.  Evaluated by Dr. Erlinda Hong with stroke work-up largely unremarkable with ddx recurrent TGA vs hypertensive encephalopathy vs TIA.  Recommended continuation of Xarelto for new diagnosis of atrial flutter and secondary stroke prevention.  Presented with hypertensive urgency stabilized during admission recommended long-term BP goal normotensive range. Hx of HLD and recommended resuming Crestor 10 mg daily.  Other stroke risk factors include advanced age, former tobacco use, obesity, CAD with hx of STEMI w/ PCI and chronic systolic CHF.  Evaluated by therapies and discharged home in stable condition without therapy needs.  Recurrent TGA vs. Hypertensive encephalopathy vs. TIA Code Stroke CT head No acute abnormality.  Repeat CT 24h Unremarkable  TCD unremarkable  Carotid Doppler  B ICA 1-39% stenosis, VAs antegrade  2D Echo EF 60-65%. No source of embolus. LA moderately dilated. EEG neg sz LDL UTC, TC 120, TG 69  HgbA1c 5.5 VTE prophylaxis - Xarelto Xarelto (rivaroxaban) daily prior to admission, now Xarelto (rivaroxaban) daily and continue at d/c Therapy recommendations:  No needs Disposition:  Return home    ROS:   14 system review of systems performed and negative with exception of no complaints  PMH:  Past Medical History:  Diagnosis Date   Acute inferolateral myocardial infarction ([place]) 03/24/2013   Acute MI, inferoposterior wall, initial episode of care ([place]) 03/28/2013   Acute myocardial infarction of other inferior  wall, initial episode of care    Amnesia, global, transient    Arthritis    needs left knee replacement   Blood transfusion without reported diagnosis    CAD (coronary artery disease)    a. 03/2013 Infpost STEMI/PCI: LM nl, LAD min  irregs, LCX 100 (3.0x12 Vision BMS), RCA  mild ostial dzs, EF 55%.   Essential hypertension, benign    Fibroids    a. uterine - spotting noted since 03/11/2013.   GERD 11/19/2007   Qualifier: Diagnosis of  By: Henrene Pastor MD, Docia Chuck    GERD (gastroesophageal reflux disease)    Hearing loss    a. s/p cochlear implant on right, hearing aid on left.   Hernia of anterior abdominal wall    History of cardiovascular stress test    Lexiscan Myoview 8/16:  EF 58%, inferolateral scar, no ischemia; Low Risk   Hyperlipidemia    Hypothyroidism    Ischemic cardiomyopathy    Echo 7/16:  Septal and posterior lateral HK, EF 45-50%, mild LAE   IV Contrast Allergy    Near syncope 06/26/2014   Obesity    Old MI (myocardial infarction) 03/03/2014   Retrograde amnesia 06/26/2014   Rosacea    Ventricular tachycardia    a. Immediate post pci/MI - no complications, on BB.    PSH:  Past Surgical History:  Procedure Laterality Date   CERVICAL POLYPECTOMY     2010   CHOLECYSTECTOMY     COCHLEAR IMPLANT     LEFT HEART CATHETERIZATION WITH CORONARY ANGIOGRAM N/A 03/25/2013   Procedure: LEFT HEART CATHETERIZATION WITH CORONARY ANGIOGRAM;  Surgeon: Burnell Blanks, MD;  Location: [facility] CATH LAB;  Service: Cardiovascular;  Laterality: N/A;   ROBOTIC ASSISTED TOTAL HYSTERECTOMY WITH BILATERAL SALPINGO OOPHERECTOMY Bilateral 08/16/2014   Procedure: ROBOTIC ASSISTED TOTAL HYSTERECTOMY WITH BILATERAL SALPINGO OOPHORECTOMY ;  Surgeon: Everitt Amber, MD;  Location: [facility] ORS;  Service: Gynecology;  Laterality: Bilateral;   UPPER GASTROINTESTINAL ENDOSCOPY     uterine polyp     had D and C done to remove the polyp    Social History:  Social History   Socioeconomic History   Marital status: Divorced    Spouse name: Not on file   Number of children: Not on file   Years of education: Not on file   Highest education level: Not on file  Occupational History   Occupation: retired from Data processing manager work  Tobacco Use    Smoking status: Former    Packs/day: 0.20    Years: 2.00    Pack years: 0.40    Types: Cigarettes    Quit date: 03/18/1968    Years since quitting: 52.9   Smokeless tobacco: Never  Vaping Use   Vaping Use: Never used  Substance and Sexual Activity   Alcohol use: No   Drug use: No   Sexual activity: Not Currently  Other Topics Concern   Not on file  Social History Narrative   Lives in [place] by herself.  Sister is nearby and is Healthcare POA.   Social Determinants of Health   Financial Resource Strain: Not on file  Food Insecurity: Not on file  Transportation Needs: Not on file  Physical Activity: Not on file  Stress: Not on file  Social Connections: Not on file  Intimate Partner Violence: Not on file    Family History:  Family History  Problem Relation Age of Onset   Heart failure Mother        died in her 36's.  Cancer Mother    Diabetes Mother    Heart disease Mother    Hypertension Mother    Heart attack Mother    Other Father        died in WWII   Hyperlipidemia Sister    Diabetes Sister    Heart attack Maternal Grandmother    Colon cancer Cousin    Esophageal cancer Neg Hx    Stomach cancer Neg Hx    Stroke Neg Hx    Rectal cancer Neg Hx     Medications:   Current Outpatient Medications on File Prior to Visit  Medication Sig Dispense Refill   Calcium Carb-Cholecalciferol (CALCIUM + D3) 600-200 MG-UNIT TABS Take 1 tablet by mouth every morning.      COVID-19 mRNA bivalent vaccine, Pfizer, (PFIZER COVID-19 VAC BIVALENT) injection Inject into the muscle. 0.3 mL 0   COVID-19 mRNA Vac-TriS, Pfizer, (PFIZER-BIONT COVID-19 VAC-TRIS) SUSP injection Inject into the muscle. 0.3 mL 0   Cyanocobalamin (VITAMIN B 12 PO) Take 1,000 tablets by mouth daily.     diclofenac sodium (VOLTAREN) 1 % GEL Apply 2 g topically 2 (two) times daily. Applies to knees     furosemide (LASIX) 20 MG tablet Take 1 tablet (20 mg total) by mouth daily. 90 tablet 1   levothyroxine  (SYNTHROID, LEVOTHROID) 100 MCG tablet Take 100 mcg by mouth daily before breakfast.     lisinopril (ZESTRIL) 40 MG tablet TAKE 1 TABLET ONCE DAILY. 90 tablet 3   metoprolol succinate (TOPROL-XL) 50 MG 24 hr tablet Take 1.5 tablets (75 mg total) by mouth daily. Take with or immediately following a meal. 135 tablet 3   metroNIDAZOLE (METROGEL) 0.75 % gel Apply 1 application topically daily. Patient places on her cheeks and nose, only about once or twice a week depending on the climate.     NITROSTAT 0.4 MG SL tablet DISSOLVE 1 TABLET UNDER TONGUE AS NEEDED FOR CHEST PAIN,MAY REPEAT IN5 MINUTES FOR 2 DOSES. 25 tablet 3   pantoprazole (PROTONIX) 40 MG tablet TAKE 1 TABLET ONCE DAILY. 90 tablet 3   potassium chloride SA (KLOR-CON) 20 MEQ tablet Take 1 tablet (20 mEq total) by mouth daily. 90 tablet 1   rivaroxaban (XARELTO) 20 MG TABS tablet Take 1 tablet (20 mg total) by mouth daily with supper. 30 tablet 11   rosuvastatin (CRESTOR) 10 MG tablet Take 10 mg by mouth 2 (two) times a week.     Current Facility-Administered Medications on File Prior to Visit  Medication Dose Route Frequency Provider Last Rate Last Admin   0.9 %  sodium chloride infusion  500 mL Intravenous Once Irene Shipper, MD        Allergies:   Allergies  Allergen Reactions   Bee Venom Itching and Swelling   Contrast Media [Iodinated Diagnostic Agents] Hives    dye   Fish Allergy Anaphylaxis    Shell fish   Iohexol Hives      OBJECTIVE:  Physical Exam  Vitals:   01/30/21 1235 01/30/21 1303  BP: (!) 183/92 (!) 142/92  Pulse: 62   Weight: 215 lb (97.5 kg)   Height: 5\' 6"  (1.676 m)     Body mass index is 34.7 kg/m. No results found.  General: well developed, well nourished, very pleasant elderly Caucasian female, seated, in no evident distress Head: head normocephalic and atraumatic.   Neck: supple with no carotid or supraclavicular bruits Cardiovascular: regular rate and rhythm, no murmurs Musculoskeletal:  no deformity Skin:  no  rash/petichiae Vascular:  Normal pulses all extremities   Neurologic Exam Mental Status: Awake and fully alert.   Fluent speech and language.  Oriented to place and time. Recent and remote memory intact. Attention span, concentration and fund of knowledge appropriate. Mood and affect appropriate.  Cranial Nerves: Pupils equal, briskly reactive to light. Extraocular movements full without nystagmus. Visual fields full to confrontation. Hearing intact. Facial sensation intact. Face, tongue, palate moves normally and symmetrically.  Motor: Normal bulk and tone. Normal strength in all tested extremity muscles. Sensory.: intact to touch , pinprick , position and vibratory sensation.  Coordination: Rapid alternating movements normal in all extremities. Finger-to-nose and heel-to-shin performed accurately bilaterally. Gait and Station: Arises from chair without difficulty. Stance is normal. Gait demonstrates normal stride length and mild imbalance (chronic knee pain) without use of assistive device Reflexes: 1+ and symmetric. Toes downgoing.        ASSESSMENT: [name] is a 80 y.o. year old female evaluated in the ED for palpitations on 12/22/2019 diagnosed with atrial flutter and started on Xarelto and discharged home.  She returned later that evening with complaints of confusion with concern of recurrent TGA vs hypertensive encephalopathy vs TIA and stroke work-up unremarkable. Vascular risk factors include hx of TGA, hx of MI, HTN, HLD, new dx of A. Flutter, former tobacco use, obesity, CAD and chronic systolic CHF.      PLAN:  TGA vs TIA :  Stable without new or recurring symptoms Continue Xarelto (rivaroxaban) daily  and Crestor for secondary stroke prevention.  Discussed secondary stroke prevention measures and importance of close PCP follow up for aggressive stroke risk factor management  A flutter: Continue Xarelto per cardiology. F/u scheduled 12/28 HTN:  BP goal <130/90.  Slightly elevated today but stable at home on metoprolol and lisinopril per cardiology HLD: LDL goal <70.  On Crestor per PCP - reports lipid panel routinely monitored by PCP (unable to personally view via epic)    Overall stable from neurological standpoint - follow up as needed    CC:  Burnard Bunting, MD    I spent 32 minutes of face-to-face and non-face-to-face time with patient.  This included previsit chart review, lab review, study review, electronic health record documentation, patient education and discussion regarding TGA versus TIA, secondary stroke prevention measures and aggressive stroke risk factor management and answered all other questions to patient satisfaction  Frann Rider, [facility]  [facility] [address] [place], [place] [address]  Phone [phone] Fax [phone] Note: This document was prepared with digital dictation and possible smart phrase technology. Any transcriptional errors that result from this process are unintentional.

## 2021-01-30 NOTE — Patient Instructions (Signed)
Continue Xarelto (rivaroxaban) daily  and Crestor  for secondary stroke prevention  Continue to follow with cardiology for atrial fibrillation and xarelto management   Continue to follow up with PCP regarding cholesterol and blood pressure management  Maintain strict control of hypertension with blood pressure goal below 130/90 and cholesterol with LDL cholesterol (bad cholesterol) goal below 70 mg/dL.         Thank you for coming to see [place] at [facility]. I hope we have been able to provide you high quality care today.  You may receive a patient satisfaction survey over the next few weeks. We would appreciate your feedback and comments so that we may continue to improve ourselves and the health of our patients.

## 2021-01-31 ENCOUNTER — Encounter: Payer: Self-pay | Admitting: Internal Medicine

## 2021-02-28 ENCOUNTER — Other Ambulatory Visit: Payer: Self-pay | Admitting: Interventional Cardiology

## 2021-03-12 NOTE — Progress Notes (Signed)
Cardiology Office Note   Date:  03/14/2021   ID:  [name], [name] 29-Sep-1940, MRN [id number]  PCP:  Burnard Bunting, MD    No chief complaint on file.  CAD  Wt Readings from Last 3 Encounters:  03/14/21 213 lb (96.6 kg)  01/30/21 215 lb (97.5 kg)  01/25/21 218 lb (98.9 kg)       History of Present Illness: [name] is a 80 y.o. female   with a hx of CAD status post inferoposterior STEMI 03/2013 treated with a BMS to the left circumflex, HL, HTN, IV dye allergy. Post PCI, she had recurrent nonsustained ventricular tachycardia. Relook cardiac catheterization demonstrated patent circumflex stent. Ejection fraction has been well-preserved. She has had persistent DOE since her MI without significant change.  Echo in 06/2013 demonstrated normal LVF.  PFTs were unremarkable in 2015.  She has seen pulmonology as well.    Admitted to the hospital in 06/2014 with acute blood loss anemia from profound vaginal bleeding. Hemoglobin dropped to 7.9. She was transfused with 1 unit of PRBCs. Antiplatelet agents were stopped.  Echo showed mildly decreased left ventricular function. She ultimately underwent a stress test due to decreased ejection fraction. No ischemia was found.  Ultimately, she ahd a hystectomy in 2016.      She had a lung cancer patient who is staying with her during [place] isolation.  Her nephew developed CHF.    She received COVID vaccines, back in Feb/March 2021.   In 2021, she had an episode when she woke up with an irregular heart beat. BP increased as well.  She went to the ER.  She does not have a full memory of these events.  Review of records showed atrial flutter with 2:1 conduction.  SHe converted on her own to NSR.  SHe was started on Xarelto.    In 6/22, Toprol was increased to 75 mg daily due to palpitations, and Zetia was started.   No bleeding problems on Xarelto.   Denies : Chest pain. Dizziness. Leg edema. Nitroglycerin use. Orthopnea.  Palpitations. Paroxysmal nocturnal dyspnea. Shortness of breath. Syncope.    Past Medical History:  Diagnosis Date   Acute inferolateral myocardial infarction ([name]) 03/24/2013   Acute MI, inferoposterior wall, initial episode of care ([place]) 03/28/2013   Acute myocardial infarction of other inferior wall, initial episode of care    Amnesia, global, transient    Arthritis    needs left knee replacement   Blood transfusion without reported diagnosis    CAD (coronary artery disease)    a. 03/2013 Infpost STEMI/PCI: LM nl, LAD min irregs, LCX 100 (3.0x12 Vision BMS), RCA  mild ostial dzs, EF 55%.   Essential hypertension, benign    Fibroids    a. uterine - spotting noted since 03/11/2013.   GERD 11/19/2007   Qualifier: Diagnosis of  By: Henrene Pastor MD, Docia Chuck    GERD (gastroesophageal reflux disease)    Hearing loss    a. s/p cochlear implant on right, hearing aid on left.   Hernia of anterior abdominal wall    History of cardiovascular stress test    Lexiscan Myoview 8/16:  EF 58%, inferolateral scar, no ischemia; Low Risk   Hyperlipidemia    Hypothyroidism    Ischemic cardiomyopathy    Echo 7/16:  Septal and posterior lateral HK, EF 45-50%, mild LAE   IV Contrast Allergy    Near syncope 06/26/2014   Obesity    Old MI (myocardial infarction) 03/03/2014  Retrograde amnesia 06/26/2014   Rosacea    Ventricular tachycardia    a. Immediate post pci/MI - no complications, on BB.    Past Surgical History:  Procedure Laterality Date   CERVICAL POLYPECTOMY     2010   CHOLECYSTECTOMY     COCHLEAR IMPLANT     LEFT HEART CATHETERIZATION WITH CORONARY ANGIOGRAM N/A 03/25/2013   Procedure: LEFT HEART CATHETERIZATION WITH CORONARY ANGIOGRAM;  Surgeon: Burnell Blanks, MD;  Location: [facility] CATH LAB;  Service: Cardiovascular;  Laterality: N/A;   ROBOTIC ASSISTED TOTAL HYSTERECTOMY WITH BILATERAL SALPINGO OOPHERECTOMY Bilateral 08/16/2014   Procedure: ROBOTIC ASSISTED TOTAL HYSTERECTOMY WITH  BILATERAL SALPINGO OOPHORECTOMY ;  Surgeon: Everitt Amber, MD;  Location: [facility] ORS;  Service: Gynecology;  Laterality: Bilateral;   UPPER GASTROINTESTINAL ENDOSCOPY     uterine polyp     had D and C done to remove the polyp     Current Outpatient Medications  Medication Sig Dispense Refill   Calcium Carb-Cholecalciferol (CALCIUM + D3) 600-200 MG-UNIT TABS Take 1 tablet by mouth every morning.      Cyanocobalamin (VITAMIN B 12 PO) Take 1,000 tablets by mouth daily.     furosemide (LASIX) 20 MG tablet Take 1 tablet (20 mg total) by mouth daily. 90 tablet 1   levothyroxine (SYNTHROID, LEVOTHROID) 100 MCG tablet Take 100 mcg by mouth daily before breakfast.     lisinopril (ZESTRIL) 40 MG tablet Take 1 tablet (40 mg total) by mouth daily. Pt needs to keep appt in Dec for further refills 30 tablet 0   metoprolol succinate (TOPROL-XL) 50 MG 24 hr tablet Take 1.5 tablets (75 mg total) by mouth daily. Take with or immediately following a meal. 135 tablet 3   metroNIDAZOLE (METROGEL) 0.75 % gel Apply 1 application topically daily. Patient places on her cheeks and nose, only about once or twice a week depending on the climate.     NITROSTAT 0.4 MG SL tablet DISSOLVE 1 TABLET UNDER TONGUE AS NEEDED FOR CHEST PAIN,MAY REPEAT IN5 MINUTES FOR 2 DOSES. 25 tablet 3   pantoprazole (PROTONIX) 40 MG tablet TAKE 1 TABLET ONCE DAILY. 90 tablet 3   potassium chloride SA (KLOR-CON) 20 MEQ tablet Take 1 tablet (20 mEq total) by mouth daily. 90 tablet 1   rivaroxaban (XARELTO) 20 MG TABS tablet Take 1 tablet (20 mg total) by mouth daily with supper. 30 tablet 11   rosuvastatin (CRESTOR) 10 MG tablet Take 10 mg by mouth 2 (two) times a week.     diclofenac sodium (VOLTAREN) 1 % GEL Apply 2 g topically 2 (two) times daily. Applies to knees (Patient not taking: Reported on 03/14/2021)     Current Facility-Administered Medications  Medication Dose Route Frequency Provider Last Rate Last Admin   0.9 %  sodium chloride infusion   500 mL Intravenous Once Irene Shipper, MD        Allergies:   Bee venom, Contrast media [iodinated contrast media], Fish allergy, and Iohexol    Social History:  The patient  reports that she quit smoking about 53 years ago. Her smoking use included cigarettes. She has a 0.40 pack-year smoking history. She has never used smokeless tobacco. She reports that she does not drink alcohol and does not use drugs.   Family History:  The patient's family history includes Cancer in her mother; Colon cancer in her cousin; Diabetes in her mother and sister; Heart attack in her maternal grandmother and mother; Heart disease in her mother; Heart failure  in her mother; Hyperlipidemia in her sister; Hypertension in her mother; Other in her father.    ROS:  Please see the history of present illness.   Otherwise, review of systems are positive for occasional palpitations.   All other systems are reviewed and negative.    PHYSICAL EXAM: VS:  BP (!) 142/86 (BP Location: Left Arm, Patient Position: Sitting, Cuff Size: Large)    Pulse 65    Ht 5\' 6"  (1.676 m)    Wt 213 lb (96.6 kg)    SpO2 98%    BMI 34.38 kg/m  , BMI Body mass index is 34.38 kg/m. GEN: Well nourished, well developed, in no acute distress HEENT: normal Neck: no JVD, carotid bruits, or masses Cardiac: RRR; no murmurs, rubs, or gallops,no edema  Respiratory:  clear to auscultation bilaterally, normal work of breathing GI: soft, nontender, nondistended, + BS MS: no deformity or atrophy Skin: warm and dry, no rash Neuro:  Strength and sensation are intact Psych: euthymic mood, full affect   EKG:   The ekg ordered today demonstrates NSR, lateral T wave flattening   Recent Labs: No results found for requested labs within last 8760 hours.   Lipid Panel    Component Value Date/Time   CHOL 129 12/23/2019 0823   CHOL 148 09/30/2017 0832   TRIG 69 12/23/2019 0823   HDL 44 12/23/2019 0823   HDL 46 09/30/2017 0832   CHOLHDL 2.9 12/23/2019  0823   VLDL 14 12/23/2019 0823   LDLCALC NOT CALCULATED 12/23/2019 0823   LDLCALC 78 09/30/2017 0832     Other studies Reviewed: Additional studies/ records that were reviewed today with results demonstrating: labs reviewed.   ASSESSMENT AND PLAN:  CAD/Old MI: No angina on medical therapy.  No CHF.  Atrial flutter: Xarelto for stroke prevention. Hbg 14.3 in 5/22. Hypertensive heart disease: The current medical regimen is effective;  continue present plan and medications.   Less palpitations on higher dose metoprolol.  Palpitations on occasion.  Most readings need 140 at home- has a wrist cuff. Increased with emotional stress. Hyperlipidemia: LDL 100.  Refer lipid clinic.  Cannot tolerate more than 2x/week statin.  Did not tolerate Zetia.  Leg edema: Elevate legs.    Current medicines are reviewed at length with the patient today.  The patient concerns regarding her medicines were addressed.  The following changes have been made:  No change  Labs/ tests ordered today include: CBC, BMet No orders of the defined types were placed in this encounter.   Recommend 150 minutes/week of aerobic exercise Low fat, low carb, high fiber diet recommended  Disposition:   FU in 8-9 months   Signed, Larae Grooms, MD  03/14/2021 1:27 PM    [facility] [place], [place], [place]  [address] Phone: [phone]; Fax: [phone]

## 2021-03-14 ENCOUNTER — Ambulatory Visit (INDEPENDENT_AMBULATORY_CARE_PROVIDER_SITE_OTHER): Payer: Medicare Other | Admitting: Interventional Cardiology

## 2021-03-14 ENCOUNTER — Other Ambulatory Visit: Payer: Self-pay

## 2021-03-14 ENCOUNTER — Encounter: Payer: Self-pay | Admitting: Interventional Cardiology

## 2021-03-14 VITALS — BP 142/86 | HR 65 | Ht 66.0 in | Wt 213.0 lb

## 2021-03-14 DIAGNOSIS — I10 Essential (primary) hypertension: Secondary | ICD-10-CM | POA: Diagnosis not present

## 2021-03-14 DIAGNOSIS — I252 Old myocardial infarction: Secondary | ICD-10-CM

## 2021-03-14 DIAGNOSIS — E785 Hyperlipidemia, unspecified: Secondary | ICD-10-CM

## 2021-03-14 DIAGNOSIS — I25118 Atherosclerotic heart disease of native coronary artery with other forms of angina pectoris: Secondary | ICD-10-CM

## 2021-03-14 DIAGNOSIS — Z7901 Long term (current) use of anticoagulants: Secondary | ICD-10-CM

## 2021-03-14 DIAGNOSIS — I6523 Occlusion and stenosis of bilateral carotid arteries: Secondary | ICD-10-CM

## 2021-03-14 MED ORDER — FUROSEMIDE 20 MG PO TABS: 20.0000 mg | ORAL_TABLET | Freq: Every day | ORAL | 1 refills | Status: AC

## 2021-03-14 MED ORDER — NITROGLYCERIN 0.4 MG SL SUBL: SUBLINGUAL_TABLET | SUBLINGUAL | 3 refills | Status: DC

## 2021-03-14 MED ORDER — POTASSIUM CHLORIDE CRYS ER 20 MEQ PO TBCR: 20.0000 meq | EXTENDED_RELEASE_TABLET | Freq: Every day | ORAL | 1 refills | Status: AC

## 2021-03-14 MED ORDER — ROSUVASTATIN CALCIUM 10 MG PO TABS
10.0000 mg | ORAL_TABLET | ORAL | 3 refills | Status: DC
Start: 2021-03-15 — End: 2021-04-03

## 2021-03-14 MED ORDER — LISINOPRIL 40 MG PO TABS
40.0000 mg | ORAL_TABLET | Freq: Every day | ORAL | 3 refills | Status: DC
Start: 2021-03-14 — End: 2022-03-19

## 2021-03-14 NOTE — Patient Instructions (Signed)
Medication Instructions:  Your physician recommends that you continue on your current medications as directed. Please refer to the Current Medication list given to you today.  *If you need a refill on your cardiac medications before your next appointment, please call your pharmacy*   Lab Work: BMET and CBC today  If you have labs (blood work) drawn today and your tests are completely normal, you will receive your results only by: [place] (if you have MyChart) OR A paper copy in the mail If you have any lab test that is abnormal or we need to change your treatment, we will call you to review the results.   Testing/Procedures: None   Follow-Up: At [facility], you and your health needs are our priority.  As part of our continuing mission to provide you with exceptional heart care, we have created designated Provider Care Teams.  These Care Teams include your primary Cardiologist (physician) and Advanced Practice Providers (APPs -  Physician Assistants and Nurse Practitioners) who all work together to provide you with the care you need, when you need it.  We recommend signing up for the patient portal called "MyChart".  Sign up information is provided on this After Visit Summary.  MyChart is used to connect with patients for Virtual Visits (Telemedicine).  Patients are able to view lab/test results, encounter notes, upcoming appointments, etc.  Non-urgent messages can be sent to your provider as well.   To learn more about what you can do with MyChart, go to [email or web address].    Your next appointment:   9 month(s)  The format for your next appointment:   In Person  Provider:   Larae Grooms, MD     Other Instructions

## 2021-03-15 ENCOUNTER — Telehealth: Payer: Self-pay

## 2021-03-15 LAB — BASIC METABOLIC PANEL
BUN/Creatinine Ratio: 14 (ref 12–28)
BUN: 12 mg/dL (ref 8–27)
CO2: 24 mmol/L (ref 20–29)
Calcium: 9.5 mg/dL (ref 8.7–10.3)
Chloride: 100 mmol/L (ref 96–106)
Creatinine, Ser: 0.83 mg/dL (ref 0.57–1.00)
Glucose: 127 mg/dL — ABNORMAL HIGH (ref 70–99)
Potassium: 3.9 mmol/L (ref 3.5–5.2)
Sodium: 139 mmol/L (ref 134–144)
eGFR: 72 mL/min/{1.73_m2} (ref >59)

## 2021-03-15 LAB — CBC
Hematocrit: 44 % (ref 34.0–46.6)
Hemoglobin: 14.4 g/dL (ref 11.1–15.9)
MCH: 29.9 pg (ref 26.6–33.0)
MCHC: 32.7 g/dL (ref 31.5–35.7)
MCV: 92 fL (ref 79–97)
Platelets: 204 10*3/uL (ref 150–450)
RBC: 4.81 x10E6/uL (ref 3.77–5.28)
RDW: 12.1 % (ref 11.7–15.4)
WBC: 6 10*3/uL (ref 3.4–10.8)

## 2021-03-15 NOTE — Telephone Encounter (Signed)
-----   Message from Rollen Sox, [facility] sent at 03/15/2021 10:13 AM EST ----- [place] S[place],  Please call to schedule at [facility] or [facility] ----- Message ----- From: Jettie Booze, MD Sent: 03/14/2021   1:54 PM EST To: Rollen Sox, [facility]  Gerald Stabs, please look at lipid options for her.  LDL 100 after STEMI many years ago. Crestor 2x/week is best we could do.  Did not tolerate Zetia. Thanks

## 2021-03-15 NOTE — Telephone Encounter (Signed)
Lmom to schedule lipid visit w/pharmd

## 2021-04-02 NOTE — Progress Notes (Signed)
Patient ID: [name]                 DOB: [birth year]                    MRN: [id number]     HPI: [name] is a 81 y.o. female patient referred to lipid clinic by Dr. Irish Lack. PMH is significant for hx of CAD status post inferoposterior STEMI 03/2013 treated with a BMS to the left circumflex, HLD, HTN, IV dye allergy, obesity, and ventricular tachycardia. Post MI/PCI, she had recurrent nonsustained ventricular tachycardia. Relook cardiac catheterization demonstrated patent circumflex stent. Echo in 06/2014 demonstrated mildly decreased LVEF (45-50%). In 2021, pt was diagnosed with atrial flutter with 2:1 conduction and converted to NSR, on Xarelto. In 12/2019, pt reported that she could only tolerate rosuvastatin 20 mg twice weekly. In 08/2020, Zetia 10 mg daily was added to a rosuvastatin 10 mg twice weekly regimen. At last follow up on 12/28, pt reported she did not tolerate Zetia and it was discontinued. Pt was referred for follow up at lipid clinic to discuss further options.  Pt presents today in good spirits. She has been taking rosuvastatin 10 mg twice weekly. Pt reports she has tried higher doses of rosuvastatin and atorvastatin in the past which made her sore all over, especially in her knees where she has arthritis. Pt had same reaction after two doses of Zetia 10 mg daily. She reports she still has some soreness on rosuvastatin 10 mg two times weekly, but it is manageable. Pt was receptive to learning about other options, but would prefer to take an oral medication over an injectable. She stated she was unsure she would be able to give herself an injection or remember to take it, but would prefer a self-injection over one that is given in an office setting. Pt is also concerned about cost as her Xarelto medication is also branded.  Current Medications: rosuvastatin 10 mg twice weekly  Intolerances: Zetia 10 mg daily (soreness) atorvastatin 10 mg, 40 mg, 80 mg daily, rosuvastatin  10 mg, 20 mg daily (soreness), Lovaza 1 g daily - statins/Zetia make her feel like she's "been in a car wreck"  Risk Factors: hx of CAD s/p MI, HTN, family history  LDL goal: <70 mg/dL   Diet: eats lots of veggies, not many fried foods or sweets, weakness is sodas   Exercise: arthritis, needs two knee replacements, has limited mobility, walk as much as she can, stairs make arthritis worse, completes lots of household chores, completes strength band training  Family History: The patient's family history includes Cancer in her mother; Colon cancer in her cousin; Diabetes in her mother and sister; Heart attack in her maternal grandmother and mother; Heart disease in her mother; Heart failure in her mother; Hyperlipidemia in her sister; Hypertension in her mother  Social History: The patient  reports that she quit smoking about 53 years ago. Her smoking use included cigarettes. She has a 0.40 pack-year smoking history. She has never used smokeless tobacco. She reports that she does not drink alcohol and does not use drugs.  Labs: 07/26/20: TC 160, HDL 44, LDL 100, TG 82 (on twice weekly rosuvastatin 10mg )  Past Medical History:  Diagnosis Date   Acute inferolateral myocardial infarction ([place]) 03/24/2013   Acute MI, inferoposterior wall, initial episode of care ([place]) 03/28/2013   Acute myocardial infarction of other inferior wall, initial episode of care    Amnesia, global, transient  Arthritis    needs left knee replacement   Blood transfusion without reported diagnosis    CAD (coronary artery disease)    a. 03/2013 Infpost STEMI/PCI: LM nl, LAD min irregs, LCX 100 (3.0x12 Vision BMS), RCA  mild ostial dzs, EF 55%.   Essential hypertension, benign    Fibroids    a. uterine - spotting noted since 03/11/2013.   GERD 11/19/2007   Qualifier: Diagnosis of  By: Henrene Pastor MD, Docia Chuck    GERD (gastroesophageal reflux disease)    Hearing loss    a. s/p cochlear implant on right, hearing aid on left.    Hernia of anterior abdominal wall    History of cardiovascular stress test    Lexiscan Myoview 8/16:  EF 58%, inferolateral scar, no ischemia; Low Risk   Hyperlipidemia    Hypothyroidism    Ischemic cardiomyopathy    Echo 7/16:  Septal and posterior lateral HK, EF 45-50%, mild LAE   IV Contrast Allergy    Near syncope 06/26/2014   Obesity    Old MI (myocardial infarction) 03/03/2014   Retrograde amnesia 06/26/2014   Rosacea    Ventricular tachycardia    a. Immediate post pci/MI - no complications, on BB.    Current Outpatient Medications on File Prior to Visit  Medication Sig Dispense Refill   Calcium Carb-Cholecalciferol (CALCIUM + D3) 600-200 MG-UNIT TABS Take 1 tablet by mouth every morning.      Cyanocobalamin (VITAMIN B 12 PO) Take 1,000 tablets by mouth daily.     diclofenac sodium (VOLTAREN) 1 % GEL Apply 2 g topically 2 (two) times daily. Applies to knees (Patient not taking: Reported on 03/14/2021)     furosemide (LASIX) 20 MG tablet Take 1 tablet (20 mg total) by mouth daily. 90 tablet 1   levothyroxine (SYNTHROID, LEVOTHROID) 100 MCG tablet Take 100 mcg by mouth daily before breakfast.     lisinopril (ZESTRIL) 40 MG tablet Take 1 tablet (40 mg total) by mouth daily. 90 tablet 3   metoprolol succinate (TOPROL-XL) 50 MG 24 hr tablet Take 1.5 tablets (75 mg total) by mouth daily. Take with or immediately following a meal. 135 tablet 3   metroNIDAZOLE (METROGEL) 0.75 % gel Apply 1 application topically daily. Patient places on her cheeks and nose, only about once or twice a week depending on the climate.     nitroGLYCERIN (NITROSTAT) 0.4 MG SL tablet DISSOLVE 1 TABLET UNDER TONGUE AS NEEDED FOR CHEST PAIN,MAY REPEAT IN5 MINUTES FOR 2 DOSES. 25 tablet 3   pantoprazole (PROTONIX) 40 MG tablet TAKE 1 TABLET ONCE DAILY. 90 tablet 3   potassium chloride SA (KLOR-CON M) 20 MEQ tablet Take 1 tablet (20 mEq total) by mouth daily. 90 tablet 1   rivaroxaban (XARELTO) 20 MG TABS tablet  Take 1 tablet (20 mg total) by mouth daily with supper. 30 tablet 11   rosuvastatin (CRESTOR) 10 MG tablet Take 1 tablet (10 mg total) by mouth 2 (two) times a week. 90 tablet 3   Current Facility-Administered Medications on File Prior to Visit  Medication Dose Route Frequency Provider Last Rate Last Admin   0.9 %  sodium chloride infusion  500 mL Intravenous Once Irene Shipper, MD        Allergies  Allergen Reactions   Bee Venom Itching and Swelling   Contrast Media [Iodinated Contrast Media] Hives    dye   Fish Allergy Anaphylaxis    Shell fish   Iohexol Hives    Assessment/Plan:  1. Hyperlipidemia - LDL remains elevated above goal of <70 mg/dL given hx of MI. Due to lingering muscle pain with rosuvastatin 10 mg twice weekly, will decrease rosuvastatin to 5 mg three times weekly (M,W,F). Pt is not interested at this time in injectable medications, but was given information about Repatha/Praluent, as well as Leqvio, so that she can consider her options. Cost is a prohibiting factor, so Leqvio would likely be the next best option with her insurance plan. Encouraged pt to increase physical activity as able and focus on a heart healthy diet. Scheduled f/u lipid panel and LFTs in 8 weeks.   Jethro Poling, PharmD Student assisted in this visit.   Megan E. Supple, PharmD, BCACP, [place] [address] [place], [place] [address] Phone: [phone]; Fax: [phone] 04/03/2021 3:38 PM

## 2021-04-03 ENCOUNTER — Ambulatory Visit (INDEPENDENT_AMBULATORY_CARE_PROVIDER_SITE_OTHER): Payer: Medicare Other | Admitting: Pharmacist

## 2021-04-03 ENCOUNTER — Other Ambulatory Visit: Payer: Self-pay

## 2021-04-03 DIAGNOSIS — I25118 Atherosclerotic heart disease of native coronary artery with other forms of angina pectoris: Secondary | ICD-10-CM | POA: Diagnosis not present

## 2021-04-03 DIAGNOSIS — E782 Mixed hyperlipidemia: Secondary | ICD-10-CM

## 2021-04-03 MED ORDER — ROSUVASTATIN CALCIUM 5 MG PO TABS: 5.0000 mg | ORAL_TABLET | Freq: Every day | ORAL | 11 refills | Status: DC

## 2021-04-03 NOTE — Patient Instructions (Addendum)
It was nice to meet you today!  Your LDL goal is < 70  Decrease rosuvastatin from 10 mg by mouth twice weekly to 5 mg by mouth three times weekly  Other medications we can consider in the future include: 1) Leqvio - This is a subcutaneous shot give at [facility] at month 0, 3, then every 6 months and lowers your LDL cholesterol by 50% 2) Repatha or Praluent (PCSK9 Inhibitors) - This is a subcutaneous injection you give yourself at home once every 2 weeks. It lowers your LDL cholesterol by 60%  Try to focus on a heart healthy diet and increasing walking, as able.  Recheck fasting cholesterol levels on Monday, March 13th any time after 7:30am

## 2021-05-16 DIAGNOSIS — E0580 Other thyrotoxicosis without thyrotoxic crisis or storm: Secondary | ICD-10-CM | POA: Diagnosis not present

## 2021-05-16 DIAGNOSIS — H35033 Hypertensive retinopathy, bilateral: Secondary | ICD-10-CM | POA: Diagnosis not present

## 2021-05-16 DIAGNOSIS — H2513 Age-related nuclear cataract, bilateral: Secondary | ICD-10-CM | POA: Diagnosis not present

## 2021-05-28 ENCOUNTER — Other Ambulatory Visit: Payer: Medicare Other

## 2021-07-02 DIAGNOSIS — Z0489 Encounter for examination and observation for other specified reasons: Secondary | ICD-10-CM | POA: Diagnosis not present

## 2021-07-06 DIAGNOSIS — K0381 Cracked tooth: Secondary | ICD-10-CM | POA: Diagnosis not present

## 2021-08-08 DIAGNOSIS — E785 Hyperlipidemia, unspecified: Secondary | ICD-10-CM | POA: Diagnosis not present

## 2021-08-08 DIAGNOSIS — E039 Hypothyroidism, unspecified: Secondary | ICD-10-CM | POA: Diagnosis not present

## 2021-08-08 DIAGNOSIS — R7989 Other specified abnormal findings of blood chemistry: Secondary | ICD-10-CM | POA: Diagnosis not present

## 2021-08-08 DIAGNOSIS — I10 Essential (primary) hypertension: Secondary | ICD-10-CM | POA: Diagnosis not present

## 2021-08-15 DIAGNOSIS — E669 Obesity, unspecified: Secondary | ICD-10-CM | POA: Diagnosis not present

## 2021-08-15 DIAGNOSIS — Z1339 Encounter for screening examination for other mental health and behavioral disorders: Secondary | ICD-10-CM | POA: Diagnosis not present

## 2021-08-15 DIAGNOSIS — I10 Essential (primary) hypertension: Secondary | ICD-10-CM | POA: Diagnosis not present

## 2021-08-15 DIAGNOSIS — Z1331 Encounter for screening for depression: Secondary | ICD-10-CM | POA: Diagnosis not present

## 2021-08-15 DIAGNOSIS — Z0000 Encounter for general adult medical examination without abnormal findings: Secondary | ICD-10-CM | POA: Diagnosis not present

## 2021-08-15 DIAGNOSIS — R82998 Other abnormal findings in urine: Secondary | ICD-10-CM | POA: Diagnosis not present

## 2021-08-15 DIAGNOSIS — E785 Hyperlipidemia, unspecified: Secondary | ICD-10-CM | POA: Diagnosis not present

## 2021-08-15 DIAGNOSIS — I700 Atherosclerosis of aorta: Secondary | ICD-10-CM | POA: Diagnosis not present

## 2021-08-15 DIAGNOSIS — E039 Hypothyroidism, unspecified: Secondary | ICD-10-CM | POA: Diagnosis not present

## 2021-08-15 DIAGNOSIS — I2510 Atherosclerotic heart disease of native coronary artery without angina pectoris: Secondary | ICD-10-CM | POA: Diagnosis not present

## 2021-09-09 ENCOUNTER — Other Ambulatory Visit: Payer: Self-pay | Admitting: Interventional Cardiology

## 2021-09-09 DIAGNOSIS — I25118 Atherosclerotic heart disease of native coronary artery with other forms of angina pectoris: Secondary | ICD-10-CM

## 2021-10-27 ENCOUNTER — Other Ambulatory Visit: Payer: Self-pay | Admitting: Interventional Cardiology

## 2021-12-27 ENCOUNTER — Other Ambulatory Visit (HOSPITAL_BASED_OUTPATIENT_CLINIC_OR_DEPARTMENT_OTHER): Payer: Self-pay

## 2021-12-27 DIAGNOSIS — M1712 Unilateral primary osteoarthritis, left knee: Secondary | ICD-10-CM | POA: Diagnosis not present

## 2021-12-27 DIAGNOSIS — M1711 Unilateral primary osteoarthritis, right knee: Secondary | ICD-10-CM | POA: Diagnosis not present

## 2021-12-27 DIAGNOSIS — Z23 Encounter for immunization: Secondary | ICD-10-CM | POA: Diagnosis not present

## 2021-12-27 DIAGNOSIS — M170 Bilateral primary osteoarthritis of knee: Secondary | ICD-10-CM | POA: Diagnosis not present

## 2021-12-27 MED ORDER — COVID-19 MRNA 2023-2024 VACCINE (COMIRNATY) 0.3 ML INJECTION
INTRAMUSCULAR | 0 refills | Status: DC
  Filled 2021-12-27: qty 0.3, 1d supply, fill #0

## 2022-01-05 DIAGNOSIS — Z23 Encounter for immunization: Secondary | ICD-10-CM | POA: Diagnosis not present

## 2022-01-22 DIAGNOSIS — M1711 Unilateral primary osteoarthritis, right knee: Secondary | ICD-10-CM | POA: Diagnosis not present

## 2022-02-20 DIAGNOSIS — I700 Atherosclerosis of aorta: Secondary | ICD-10-CM | POA: Diagnosis not present

## 2022-02-20 DIAGNOSIS — M25569 Pain in unspecified knee: Secondary | ICD-10-CM | POA: Diagnosis not present

## 2022-02-20 DIAGNOSIS — K219 Gastro-esophageal reflux disease without esophagitis: Secondary | ICD-10-CM | POA: Diagnosis not present

## 2022-02-20 DIAGNOSIS — E669 Obesity, unspecified: Secondary | ICD-10-CM | POA: Diagnosis not present

## 2022-02-20 DIAGNOSIS — I10 Essential (primary) hypertension: Secondary | ICD-10-CM | POA: Diagnosis not present

## 2022-03-06 DIAGNOSIS — N390 Urinary tract infection, site not specified: Secondary | ICD-10-CM | POA: Diagnosis not present

## 2022-03-17 ENCOUNTER — Other Ambulatory Visit: Payer: Self-pay | Admitting: Interventional Cardiology

## 2022-03-17 DIAGNOSIS — I25118 Atherosclerotic heart disease of native coronary artery with other forms of angina pectoris: Secondary | ICD-10-CM

## 2022-04-08 ENCOUNTER — Ambulatory Visit: Payer: Medicare Other | Attending: Interventional Cardiology | Admitting: Interventional Cardiology

## 2022-04-08 ENCOUNTER — Encounter: Payer: Self-pay | Admitting: Interventional Cardiology

## 2022-04-08 VITALS — BP 164/88 | HR 97 | Ht 66.0 in | Wt 213.0 lb

## 2022-04-08 DIAGNOSIS — Z7901 Long term (current) use of anticoagulants: Secondary | ICD-10-CM

## 2022-04-08 DIAGNOSIS — I25118 Atherosclerotic heart disease of native coronary artery with other forms of angina pectoris: Secondary | ICD-10-CM

## 2022-04-08 DIAGNOSIS — E785 Hyperlipidemia, unspecified: Secondary | ICD-10-CM | POA: Diagnosis not present

## 2022-04-08 DIAGNOSIS — I10 Essential (primary) hypertension: Secondary | ICD-10-CM | POA: Diagnosis not present

## 2022-04-08 DIAGNOSIS — E782 Mixed hyperlipidemia: Secondary | ICD-10-CM

## 2022-04-08 DIAGNOSIS — I6523 Occlusion and stenosis of bilateral carotid arteries: Secondary | ICD-10-CM | POA: Diagnosis not present

## 2022-04-08 DIAGNOSIS — I252 Old myocardial infarction: Secondary | ICD-10-CM

## 2022-04-08 MED ORDER — AMLODIPINE BESYLATE 5 MG PO TABS: 5.0000 mg | ORAL_TABLET | Freq: Every day | ORAL | 6 refills | Status: DC

## 2022-04-08 NOTE — Patient Instructions (Signed)
Medication Instructions:  Your physician has recommended you make the following change in your medication:  Start amlodipine 5 mg by mouth daily   *If you need a refill on your cardiac medications before your next appointment, please call your pharmacy*   Lab Work: none If you have labs (blood work) drawn today and your tests are completely normal, you will receive your results only by: [place] (if you have MyChart) OR A paper copy in the mail If you have any lab test that is abnormal or we need to change your treatment, we will call you to review the results.   Testing/Procedures: none   Follow-Up: At [facility], you and your health needs are our priority.  As part of our continuing mission to provide you with exceptional heart care, we have created designated Provider Care Teams.  These Care Teams include your primary Cardiologist (physician) and Advanced Practice Providers (APPs -  Physician Assistants and Nurse Practitioners) who all work together to provide you with the care you need, when you need it.  We recommend signing up for the patient portal called "MyChart".  Sign up information is provided on this After Visit Summary.  MyChart is used to connect with patients for Virtual Visits (Telemedicine).  Patients are able to view lab/test results, encounter notes, upcoming appointments, etc.  Non-urgent messages can be sent to your provider as well.   To learn more about what you can do with MyChart, go to [email or web address].    Your next appointment:   12 month(s)  Provider:   Larae Grooms, MD     Other Instructions You have been referred to see the pharmacist in the hypertension clinic in our office.  Please schedule appointment for about 4 weeks from now

## 2022-04-08 NOTE — Progress Notes (Signed)
Cardiology Office Note   Date:  04/08/2022   ID:  [name], [name] [birth year], MRN [id number]  PCP:  Burnard Bunting, MD    No chief complaint on file.  CAD/Old MI  Wt Readings from Last 3 Encounters:  04/08/22 213 lb (96.6 kg)  03/14/21 213 lb (96.6 kg)  01/30/21 215 lb (97.5 kg)       History of Present Illness: [name] is a 82 y.o. female   with a hx of CAD status post inferoposterior STEMI 03/2013 treated with a BMS to the left circumflex, HL, HTN, IV dye allergy. Post PCI, she had recurrent nonsustained ventricular tachycardia. Relook cardiac catheterization demonstrated patent circumflex stent. Ejection fraction has been well-preserved. She has had persistent DOE since her MI without significant change.  Echo in 06/2013 demonstrated normal LVF.  PFTs were unremarkable in 2015.  She has seen pulmonology as well.    Admitted to the hospital in 06/2014 with acute blood loss anemia from profound vaginal bleeding. Hemoglobin dropped to 7.9. She was transfused with 1 unit of PRBCs. Antiplatelet agents were stopped.  Echo showed mildly decreased left ventricular function. She ultimately underwent a stress test due to decreased ejection fraction. No ischemia was found.  Ultimately, she ahd a hystectomy in 2016.      She had a lung cancer patient who is staying with her during [place] isolation.  Her nephew developed CHF.    She received COVID vaccines, back in Feb/March 2021.   In 2021, she had an episode when she woke up with an irregular heart beat. BP increased as well.  She went to the ER.  She does not have a full memory of these events.  Review of records showed atrial flutter with 2:1 conduction.  SHe converted on her own to NSR.  SHe was started on Xarelto.     In 6/22, Toprol was increased to 75 mg daily due to palpitations, and Zetia was started.    No bleeding problems on Xarelto.   Difficult to walk due to knee pain.  No cartilage left.  Better with  cortisone.   Denies : Chest pain. Dizziness. Leg edema. Nitroglycerin use. Orthopnea. Palpitations. Paroxysmal nocturnal dyspnea. Shortness of breath. Syncope.    Past Medical History:  Diagnosis Date   Acute inferolateral myocardial infarction ([place]) 03/24/2013   Acute MI, inferoposterior wall, initial episode of care ([place]) 03/28/2013   Acute myocardial infarction of other inferior wall, initial episode of care    Amnesia, global, transient    Arthritis    needs left knee replacement   Blood transfusion without reported diagnosis    CAD (coronary artery disease)    a. 03/2013 Infpost STEMI/PCI: LM nl, LAD min irregs, LCX 100 (3.0x12 Vision BMS), RCA  mild ostial dzs, EF 55%.   Essential hypertension, benign    Fibroids    a. uterine - spotting noted since 03/11/2013.   GERD 11/19/2007   Qualifier: Diagnosis of  By: Henrene Pastor MD, Docia Chuck    GERD (gastroesophageal reflux disease)    Hearing loss    a. s/p cochlear implant on right, hearing aid on left.   Hernia of anterior abdominal wall    History of cardiovascular stress test    Lexiscan Myoview 8/16:  EF 58%, inferolateral scar, no ischemia; Low Risk   Hyperlipidemia    Hypothyroidism    Ischemic cardiomyopathy    Echo 7/16:  Septal and posterior lateral HK, EF 45-50%, mild LAE  IV Contrast Allergy    Near syncope 06/26/2014   Obesity    Old MI (myocardial infarction) 03/03/2014   Retrograde amnesia 06/26/2014   Rosacea    Ventricular tachycardia ([place])    a. Immediate post pci/MI - no complications, on BB.    Past Surgical History:  Procedure Laterality Date   CERVICAL POLYPECTOMY     2010   CHOLECYSTECTOMY     COCHLEAR IMPLANT     LEFT HEART CATHETERIZATION WITH CORONARY ANGIOGRAM N/A 03/25/2013   Procedure: LEFT HEART CATHETERIZATION WITH CORONARY ANGIOGRAM;  Surgeon: Burnell Blanks, MD;  Location: [facility] CATH LAB;  Service: Cardiovascular;  Laterality: N/A;   ROBOTIC ASSISTED TOTAL HYSTERECTOMY WITH BILATERAL SALPINGO  OOPHERECTOMY Bilateral 08/16/2014   Procedure: ROBOTIC ASSISTED TOTAL HYSTERECTOMY WITH BILATERAL SALPINGO OOPHORECTOMY ;  Surgeon: Everitt Amber, MD;  Location: [facility] ORS;  Service: Gynecology;  Laterality: Bilateral;   UPPER GASTROINTESTINAL ENDOSCOPY     uterine polyp     had D and C done to remove the polyp     Current Outpatient Medications  Medication Sig Dispense Refill   Calcium Carb-Cholecalciferol (CALCIUM + D3) 600-200 MG-UNIT TABS Take 1 tablet by mouth every morning.      COVID-19 mRNA vaccine 2023-2024 (COMIRNATY) SUSP injection Inject into the muscle. 0.3 mL 0   Cyanocobalamin (VITAMIN B 12 PO) Take 1,000 tablets by mouth daily.     furosemide (LASIX) 20 MG tablet Take 1 tablet (20 mg total) by mouth daily. 90 tablet 1   levothyroxine (SYNTHROID, LEVOTHROID) 100 MCG tablet Take 100 mcg by mouth daily before breakfast.     lisinopril (ZESTRIL) 40 MG tablet TAKE ONE TABLET BY MOUTH DAILY 30 tablet 0   metoprolol succinate (TOPROL-XL) 50 MG 24 hr tablet TAKE 1 & 1/2 TABLETS BY MOUTH DAILY take WITH OR immediately following a meal 45 tablet 0   metroNIDAZOLE (METROGEL) 0.75 % gel Apply 1 application topically daily. Patient places on her cheeks and nose, only about once or twice a week depending on the climate.     nitroGLYCERIN (NITROSTAT) 0.4 MG SL tablet DISSOLVE 1 TABLET UNDER TONGUE AS NEEDED FOR CHEST PAIN,MAY REPEAT IN5 MINUTES FOR 2 DOSES. 25 tablet 3   pantoprazole (PROTONIX) 40 MG tablet Take 1 tablet (40 mg total) by mouth daily. Please call to schedule an overdue appointment with Dr. Irish Lack for refills, [phone], thank you. 1st attempt. 30 tablet 0   potassium chloride SA (KLOR-CON M) 20 MEQ tablet Take 1 tablet (20 mEq total) by mouth daily. 90 tablet 1   rivaroxaban (XARELTO) 20 MG TABS tablet Take 1 tablet (20 mg total) by mouth daily with supper. 30 tablet 11   rosuvastatin (CRESTOR) 5 MG tablet Take 1 tablet (5 mg total) by mouth daily. 15 tablet 11   Current  Facility-Administered Medications  Medication Dose Route Frequency Provider Last Rate Last Admin   0.9 %  sodium chloride infusion  500 mL Intravenous Once Irene Shipper, MD        Allergies:   Bee venom, Contrast media [iodinated contrast media], Fish allergy, and Iohexol    Social History:  The patient  reports that she quit smoking about 54 years ago. Her smoking use included cigarettes. She has a 0.40 pack-year smoking history. She has never used smokeless tobacco. She reports that she does not drink alcohol and does not use drugs.   Family History:  The patient's family history includes Cancer in her mother; Colon cancer in her cousin;  Diabetes in her mother and sister; Heart attack in her maternal grandmother and mother; Heart disease in her mother; Heart failure in her mother; Hyperlipidemia in her sister; Hypertension in her mother; Other in her father.    ROS:  Please see the history of present illness.   Otherwise, review of systems are positive for hearing loss.   All other systems are reviewed and negative.    PHYSICAL EXAM: VS:  BP (!) 180/102   Pulse 97   Ht '5\' 6"'$  (1.676 m)   Wt 213 lb (96.6 kg)   SpO2 (!) 67%   BMI 34.38 kg/m  , BMI Body mass index is 34.38 kg/m. GEN: Well nourished, well developed, in no acute distress HEENT: normal Neck: no JVD, carotid bruits, or masses Cardiac: RRR; no murmurs, rubs, or gallops,no edema  Respiratory:  clear to auscultation bilaterally, normal work of breathing GI: soft, nontender, nondistended, + BS MS: no deformity or atrophy Skin: warm and dry, no rash Neuro:  Strength and sensation are intact Psych: euthymic mood, full affect   EKG:   The ekg ordered today demonstrates NSR, no ST changes   Recent Labs: No results found for requested labs within last 365 days.   Lipid Panel    Component Value Date/Time   CHOL 129 12/23/2019 0823   CHOL 148 09/30/2017 0832   TRIG 69 12/23/2019 0823   HDL 44 12/23/2019 0823   HDL  46 09/30/2017 0832   CHOLHDL 2.9 12/23/2019 0823   VLDL 14 12/23/2019 0823   LDLCALC NOT CALCULATED 12/23/2019 0823   LDLCALC 78 09/30/2017 0832     Other studies Reviewed: Additional studies/ records that were reviewed today with results demonstrating: labs reviewed.   ASSESSMENT AND PLAN:  CAD/Old MI: No angina.  No bleeding issues.  Continue aggressive secondary prevention.  Activity is limited by her arthritis.  Try to eat a healthy diet. Hypertensive heart disease: Home readings are in the 144/60 range.  Typical BP.  Highest is 150s.  Repeat blood pressure today still elevated.  Will start amlodipine 5 mg daily. Hyperlipidemia: LDL 101.  Does not tolerate this well.  She is not taking this regularly.  Followed up with PharmD.  SHe was hesitant of Leqvio for fear of side effects.  SHe is not interested in taking anything else.   Atrial flutter: Decreased in frequency.  Leg edema: elevate legs.    Current medicines are reviewed at length with the patient today.  The patient concerns regarding her medicines were addressed.  The following changes have been made: Start amlodipine 5 mg daily for better blood pressure control.  Labs/ tests ordered today include:  No orders of the defined types were placed in this encounter.   Recommend 150 minutes/week of aerobic exercise Low fat, low carb, high fiber diet recommended  Disposition:   FU in 4 weeks with pharmD HTN clinic , 1 year with me   Signed, Larae Grooms, MD  04/08/2022 4:51 PM    [facility] [place], [place], [place]  [address] Phone: [phone]; Fax: [phone]

## 2022-04-10 NOTE — Addendum Note (Signed)
Addended by: Sharee Holster R on: 04/10/2022 08:26 AM   Modules accepted: Orders

## 2022-04-16 ENCOUNTER — Other Ambulatory Visit: Payer: Self-pay | Admitting: Interventional Cardiology

## 2022-04-16 DIAGNOSIS — I25118 Atherosclerotic heart disease of native coronary artery with other forms of angina pectoris: Secondary | ICD-10-CM

## 2022-05-17 ENCOUNTER — Ambulatory Visit: Payer: Medicare Other | Attending: Cardiovascular Disease | Admitting: Pharmacist

## 2022-05-17 VITALS — BP 148/60 | HR 63

## 2022-05-17 DIAGNOSIS — I6523 Occlusion and stenosis of bilateral carotid arteries: Secondary | ICD-10-CM | POA: Diagnosis not present

## 2022-05-17 DIAGNOSIS — I10 Essential (primary) hypertension: Secondary | ICD-10-CM | POA: Diagnosis not present

## 2022-05-17 NOTE — Patient Instructions (Addendum)
Your blood pressure goal is < 130/61mHg  Decrease intake of Coke - switch to water or diet/caffeine free version  Continue taking your current medications  Aim for < 2,'000mg'$  of salt each day

## 2022-05-17 NOTE — Progress Notes (Signed)
Patient ID: [name]                 DOB: Nov 27, 1940                      MRN: [id number]     HPI: [name] is a 82 y.o. female referred by Dr. Irish Lack to HTN clinic. PMH is significant for CAD s/p STEMI 03/2013 tx with BMS to LCx, HLD, HTN, aflutter, and obesity. BP elevated at 164/88 at last visit with Dr Irish Lack on 04/08/22 and she was started on amlodipine '5mg'$  daily.  Pt presents today for follow up. Experienced a mild headache and trouble focusing the first few days after starting amlodipine but both symptoms resolved in a few days. Has noticed a slight increase in swelling in her ankles. Feeling a bit lower in energy since starting amlodipine. Rarely feels aflutter sx. Reports BP readings have improved. [id number] systolic initially, now down mostly in the 120-130s range. See log details below. Reports white coat HTN at MD offices. Home BP technique is correct. Brings in detailed log of home BP readings today as well as her wrist cuff.   Home wrist cuff: 150/77 Clinic cuff: 148/60  Avoids NSAIDs. 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 Added stress some days because of this.  Current HTN meds: amlodipine '5mg'$  daily, lisinopril '40mg'$  daily, Toprol '75mg'$  daily, also on furosemide '20mg'$  daily  BP goal: <130/66mHg  Family History: Cancer in her mother; Colon cancer in her cousin; Diabetes in her mother and sister; Heart attack in her maternal grandmother and mother; Heart disease in her mother; Heart failure in her mother; Hyperlipidemia in her sister; Hypertension in her mother.   Social History: The patient  reports that she quit smoking about 54 years ago. Her smoking use included cigarettes. She has a 0.40 pack-year smoking history. She has never used smokeless tobacco. She reports that she does not drink alcohol and does not use drugs.    Diet: 2-3 regular Cokes a day. Watches her salt intake.  Exercise: Needs double knee replacement - limited walking.  Labs:  01/17/22: SCr 0.7, Na 136, K 4, SCr 0.7  Home BP: in the past 2 weeks - 118/61, 144/78, 120/47, 128/57, 130/57, 131/61, 104/55, 112/56, 121/62, 129/57, 111/61, 112/52, 127/66, 130/67, 118/63, 126/66, 135/61  Wt Readings from Last 3 Encounters:  04/08/22 213 lb (96.6 kg)  03/14/21 213 lb (96.6 kg)  01/30/21 215 lb (97.5 kg)   BP Readings from Last 3 Encounters:  04/08/22 (!) 164/88  03/14/21 (!) 142/86  01/30/21 (!) 142/92   Pulse Readings from Last 3 Encounters:  04/08/22 97  03/14/21 65  01/30/21 62    Renal function: CrCl cannot be calculated (Patient's most recent lab result is older than the maximum 21 days allowed.).  Past Medical History:  Diagnosis Date   [place] [place]   [place] MI, inferoposterior wall[place]   Acute myocardial infarction of other inferior wall, initial episode of care    Amnesia, global, transient    Arthritis    needs left knee replacement   Blood transfusion without reported diagnosis    CAD (coronary artery disease)    a. 03/2013 Infpost STEMI/PCI: LM nl, LAD min irregs, LCX 100 (3.0x12 Vision BMS), RCA  mild ostial dzs, EF 55%.   Essential hypertension, benign    Fibroids    a. uterine - spotting noted since 03/11/2013.  GERD 11/19/2007   Qualifier: Diagnosis of  By: Henrene Pastor MD, Docia Chuck    GERD (gastroesophageal reflux disease)    Hearing loss    a. s/p cochlear implant on right, hearing aid on left.   Hernia of anterior abdominal wall    History of cardiovascular stress test    Lexiscan Myoview 8/16:  EF 58%, inferolateral scar, no ischemia; Low Risk   Hyperlipidemia    Hypothyroidism    Ischemic cardiomyopathy    Echo 7/16:  Septal and posterior lateral HK, EF 45-50%, mild LAE   IV Contrast Allergy    Near syncope 06/26/2014   Obesity    Old MI (myocardial infarction) 03/03/2014   Retrograde amnesia 06/26/2014   Rosacea    Ventricular tachycardia ([place])     a. Immediate post pci/MI - no complications, on BB.    Current Outpatient Medications on File Prior to Visit  Medication Sig Dispense Refill   amLODipine (NORVASC) 5 MG tablet Take 1 tablet (5 mg total) by mouth daily. 30 tablet 6   Calcium Carb-Cholecalciferol (CALCIUM + D3) 600-200 MG-UNIT TABS Take 1 tablet by mouth every morning.      COVID-19 mRNA vaccine 2023-2024 (COMIRNATY) SUSP injection Inject into the muscle. 0.3 mL 0   Cyanocobalamin (VITAMIN B 12 PO) Take 1,000 tablets by mouth daily.     furosemide (LASIX) 20 MG tablet Take 1 tablet (20 mg total) by mouth daily. 90 tablet 1   levothyroxine (SYNTHROID, LEVOTHROID) 100 MCG tablet Take 100 mcg by mouth daily before breakfast.     lisinopril (ZESTRIL) 40 MG tablet TAKE ONE TABLET BY MOUTH DAILY 90 tablet 3   metoprolol succinate (TOPROL-XL) 50 MG 24 hr tablet TAKE 1 & 1/2 TABLETS BY MOUTH DAILY take WITH OR immediately following a meal 135 tablet 3   metroNIDAZOLE (METROGEL) 0.75 % gel Apply 1 application topically daily. Patient places on her cheeks and nose, only about once or twice a week depending on the climate.     nitroGLYCERIN (NITROSTAT) 0.4 MG SL tablet DISSOLVE ONE TABLET UNDER THE TONGUE AS NEEDED FOR CHEST PAIN MAY REPEAT IN FIVE MINUTES FOR TWO DOSES 25 tablet 3   pantoprazole (PROTONIX) 40 MG tablet Take 1 tablet (40 mg total) by mouth daily. Please call to schedule an overdue appointment with Dr. Irish Lack for refills, [phone], thank you. 1st attempt. 30 tablet 0   potassium chloride SA (KLOR-CON M) 20 MEQ tablet Take 1 tablet (20 mEq total) by mouth daily. 90 tablet 1   rivaroxaban (XARELTO) 20 MG TABS tablet Take 1 tablet (20 mg total) by mouth daily with supper. 30 tablet 11   rosuvastatin (CRESTOR) 5 MG tablet Take 1 tablet (5 mg total) by mouth daily. 15 tablet 11   Current Facility-Administered Medications on File Prior to Visit  Medication Dose Route Frequency Provider Last Rate Last Admin   0.9 %  sodium  chloride infusion  500 mL Intravenous Once Irene Shipper, MD        Allergies  Allergen Reactions   Bee Venom Itching and Swelling   Contrast Media [Iodinated Contrast Media] Hives    dye   Fish Allergy Anaphylaxis    Shell fish   Iohexol Hives     Assessment/Plan:  1. Hypertension - BP a bit elevated in clinic today however reports white coat HTN. Home BP cuff is measuring accurately so will base BP medication changes off of home readings. BP very well controlled in the past 2 weeks  at home at goal < 130/48mHg. Discussed changing amlodipine to different BP agent since she's been noticing a bit of fatigue and slight swelling in her ankles (no swelling noted on exam today), however pt prefers to continue medications as is. Will continue amlodipine '5mg'$  daily, lisinopril '40mg'$  daily, Toprol '75mg'$  daily, and furosemide '20mg'$  daily. Encouraged pt to decrease intake of regular Cokes and aim for < 2g of daily sodium. If needed in the future, could replace her furosemide with chlorthalidone. F/u with PharmD as needed.  Brixton Schnapp E. Kaylee Wombles, PharmD, BCACP, [place] [address] [place] [address]Phone: [phone] Fax: ([phone]/03/2022 4:01 PM

## 2022-08-19 DIAGNOSIS — R7989 Other specified abnormal findings of blood chemistry: Secondary | ICD-10-CM | POA: Diagnosis not present

## 2022-08-19 DIAGNOSIS — Z0000 Encounter for general adult medical examination without abnormal findings: Secondary | ICD-10-CM | POA: Diagnosis not present

## 2022-08-19 DIAGNOSIS — D649 Anemia, unspecified: Secondary | ICD-10-CM | POA: Diagnosis not present

## 2022-08-19 DIAGNOSIS — I10 Essential (primary) hypertension: Secondary | ICD-10-CM | POA: Diagnosis not present

## 2022-08-19 DIAGNOSIS — E785 Hyperlipidemia, unspecified: Secondary | ICD-10-CM | POA: Diagnosis not present

## 2022-08-19 DIAGNOSIS — E039 Hypothyroidism, unspecified: Secondary | ICD-10-CM | POA: Diagnosis not present

## 2022-09-02 DIAGNOSIS — Z1331 Encounter for screening for depression: Secondary | ICD-10-CM | POA: Diagnosis not present

## 2022-09-02 DIAGNOSIS — I110 Hypertensive heart disease with heart failure: Secondary | ICD-10-CM | POA: Diagnosis not present

## 2022-09-02 DIAGNOSIS — E039 Hypothyroidism, unspecified: Secondary | ICD-10-CM | POA: Diagnosis not present

## 2022-09-02 DIAGNOSIS — Z0000 Encounter for general adult medical examination without abnormal findings: Secondary | ICD-10-CM | POA: Diagnosis not present

## 2022-09-02 DIAGNOSIS — Z1339 Encounter for screening examination for other mental health and behavioral disorders: Secondary | ICD-10-CM | POA: Diagnosis not present

## 2022-09-02 DIAGNOSIS — R82998 Other abnormal findings in urine: Secondary | ICD-10-CM | POA: Diagnosis not present

## 2022-09-02 DIAGNOSIS — E669 Obesity, unspecified: Secondary | ICD-10-CM | POA: Diagnosis not present

## 2022-09-02 DIAGNOSIS — E785 Hyperlipidemia, unspecified: Secondary | ICD-10-CM | POA: Diagnosis not present

## 2022-09-02 DIAGNOSIS — I2510 Atherosclerotic heart disease of native coronary artery without angina pectoris: Secondary | ICD-10-CM | POA: Diagnosis not present

## 2022-09-02 DIAGNOSIS — I509 Heart failure, unspecified: Secondary | ICD-10-CM | POA: Diagnosis not present

## 2022-09-12 DIAGNOSIS — H903 Sensorineural hearing loss, bilateral: Secondary | ICD-10-CM | POA: Diagnosis not present

## 2022-10-11 DIAGNOSIS — R195 Other fecal abnormalities: Secondary | ICD-10-CM | POA: Diagnosis not present

## 2022-10-16 DIAGNOSIS — I509 Heart failure, unspecified: Secondary | ICD-10-CM | POA: Diagnosis not present

## 2022-10-16 DIAGNOSIS — R0609 Other forms of dyspnea: Secondary | ICD-10-CM | POA: Diagnosis not present

## 2022-10-16 DIAGNOSIS — I700 Atherosclerosis of aorta: Secondary | ICD-10-CM | POA: Diagnosis not present

## 2022-10-16 DIAGNOSIS — D649 Anemia, unspecified: Secondary | ICD-10-CM | POA: Diagnosis not present

## 2022-10-16 DIAGNOSIS — E611 Iron deficiency: Secondary | ICD-10-CM | POA: Diagnosis not present

## 2022-10-16 DIAGNOSIS — I4892 Unspecified atrial flutter: Secondary | ICD-10-CM | POA: Diagnosis not present

## 2022-10-16 DIAGNOSIS — I110 Hypertensive heart disease with heart failure: Secondary | ICD-10-CM | POA: Diagnosis not present

## 2022-10-16 DIAGNOSIS — D6869 Other thrombophilia: Secondary | ICD-10-CM | POA: Diagnosis not present

## 2022-10-21 ENCOUNTER — Other Ambulatory Visit (HOSPITAL_COMMUNITY): Payer: Self-pay

## 2022-10-21 ENCOUNTER — Telehealth: Payer: Self-pay | Admitting: Interventional Cardiology

## 2022-10-21 ENCOUNTER — Encounter (HOSPITAL_COMMUNITY): Payer: Medicare Other

## 2022-10-21 NOTE — Telephone Encounter (Signed)
Pt c/o medication issue:  1. Name of Medication:   rivaroxaban (XARELTO) 20 MG TABS tablet    2. How are you currently taking this medication (dosage and times per day)?   Take 1 tablet (20 mg total) by mouth daily with supper.    3. Are you having a reaction (difficulty breathing--STAT)? No  4. What is your medication issue? Pt's sister would like to know if pt is able to stop taking medication or if dosage can be decreased. Please advise   Sister also wanted to make provider aware that pt's Hemoglobin is 6.3 and has to take infusions

## 2022-10-21 NOTE — Telephone Encounter (Signed)
Reviewed with Dr Eden Emms and patient would be at higher risk for stroke but can stop Xarelto for now.  She should go to ED for evaluation.  I spoke with patient's sister and gave her this information.  I advised her patient should go to ED now and that transfusion could be given in ED if needed.

## 2022-10-21 NOTE — Telephone Encounter (Signed)
I agree with stopping Xarelto for now.  May need to consider Watchman depending on her bleeding issues.

## 2022-10-21 NOTE — Telephone Encounter (Signed)
I spoke with patient's sister.  She reports patient has been seeing Dr Jacky Kindle for low hemoglobin. Was 8.8 and she was started on iron tablets.  Saw Dr Jacky Kindle on 7/31 and had lab work done.  Sister reports patient has been feeling weak and short of breath for last 2-3 months.  Worsening in the last month.  Stool sample showed small amount of blood in stool.  Sister reports patient also has hemorrhoids.  No visible signs of bleeding per sister. They received call today that most recent hemoglobin on 7/31 was 6.3. Infusion has been ordered but sister reports this cannot be done until 8/12.   Sister is asking if patient can stop Xarelto.  She reports she has contacted Dr Lanell Matar office with this request but has not heard back.  I advised sister to call Dr Lanell Matar office again for update. Will review with provider in office.

## 2022-10-22 ENCOUNTER — Encounter (HOSPITAL_COMMUNITY): Payer: Self-pay | Admitting: Internal Medicine

## 2022-10-22 ENCOUNTER — Inpatient Hospital Stay (HOSPITAL_COMMUNITY)
Admission: EM | Admit: 2022-10-22 | Discharge: 2022-10-26 | DRG: 811 | Disposition: A | Discharge status: Home / Self Care | Payer: Medicare Other | Attending: Internal Medicine | Admitting: Internal Medicine

## 2022-10-22 DIAGNOSIS — Z90710 Acquired absence of both cervix and uterus: Secondary | ICD-10-CM

## 2022-10-22 DIAGNOSIS — I5032 Chronic diastolic (congestive) heart failure: Secondary | ICD-10-CM | POA: Diagnosis present

## 2022-10-22 DIAGNOSIS — M1712 Unilateral primary osteoarthritis, left knee: Secondary | ICD-10-CM | POA: Diagnosis present

## 2022-10-22 DIAGNOSIS — D649 Anemia, unspecified: Secondary | ICD-10-CM

## 2022-10-22 DIAGNOSIS — E039 Hypothyroidism, unspecified: Secondary | ICD-10-CM | POA: Diagnosis not present

## 2022-10-22 DIAGNOSIS — R5383 Other fatigue: Secondary | ICD-10-CM | POA: Diagnosis not present

## 2022-10-22 DIAGNOSIS — D500 Iron deficiency anemia secondary to blood loss (chronic): Secondary | ICD-10-CM

## 2022-10-22 DIAGNOSIS — I252 Old myocardial infarction: Secondary | ICD-10-CM | POA: Diagnosis not present

## 2022-10-22 DIAGNOSIS — Z974 Presence of external hearing-aid: Secondary | ICD-10-CM

## 2022-10-22 DIAGNOSIS — Z91013 Allergy to seafood: Secondary | ICD-10-CM

## 2022-10-22 DIAGNOSIS — K31819 Angiodysplasia of stomach and duodenum without bleeding: Secondary | ICD-10-CM

## 2022-10-22 DIAGNOSIS — Z79890 Hormone replacement therapy (postmenopausal): Secondary | ICD-10-CM

## 2022-10-22 DIAGNOSIS — D62 Acute posthemorrhagic anemia: Principal | ICD-10-CM | POA: Diagnosis present

## 2022-10-22 DIAGNOSIS — K317 Polyp of stomach and duodenum: Secondary | ICD-10-CM

## 2022-10-22 DIAGNOSIS — E785 Hyperlipidemia, unspecified: Secondary | ICD-10-CM | POA: Diagnosis present

## 2022-10-22 DIAGNOSIS — Z8679 Personal history of other diseases of the circulatory system: Secondary | ICD-10-CM | POA: Diagnosis not present

## 2022-10-22 DIAGNOSIS — Z7901 Long term (current) use of anticoagulants: Secondary | ICD-10-CM

## 2022-10-22 DIAGNOSIS — I10 Essential (primary) hypertension: Secondary | ICD-10-CM | POA: Diagnosis present

## 2022-10-22 DIAGNOSIS — Z83438 Family history of other disorder of lipoprotein metabolism and other lipidemia: Secondary | ICD-10-CM

## 2022-10-22 DIAGNOSIS — Z79899 Other long term (current) drug therapy: Secondary | ICD-10-CM | POA: Diagnosis not present

## 2022-10-22 DIAGNOSIS — Z955 Presence of coronary angioplasty implant and graft: Secondary | ICD-10-CM | POA: Diagnosis not present

## 2022-10-22 DIAGNOSIS — Z8249 Family history of ischemic heart disease and other diseases of the circulatory system: Secondary | ICD-10-CM | POA: Diagnosis not present

## 2022-10-22 DIAGNOSIS — Z800 Family history of malignant neoplasm of digestive organs: Secondary | ICD-10-CM | POA: Diagnosis not present

## 2022-10-22 DIAGNOSIS — Z8719 Personal history of other diseases of the digestive system: Secondary | ICD-10-CM | POA: Diagnosis not present

## 2022-10-22 DIAGNOSIS — Z91030 Bee allergy status: Secondary | ICD-10-CM

## 2022-10-22 DIAGNOSIS — Z91041 Radiographic dye allergy status: Secondary | ICD-10-CM

## 2022-10-22 DIAGNOSIS — Z9049 Acquired absence of other specified parts of digestive tract: Secondary | ICD-10-CM

## 2022-10-22 DIAGNOSIS — I5020 Unspecified systolic (congestive) heart failure: Secondary | ICD-10-CM | POA: Diagnosis not present

## 2022-10-22 DIAGNOSIS — D509 Iron deficiency anemia, unspecified: Secondary | ICD-10-CM | POA: Diagnosis not present

## 2022-10-22 DIAGNOSIS — K3189 Other diseases of stomach and duodenum: Secondary | ICD-10-CM | POA: Diagnosis not present

## 2022-10-22 DIAGNOSIS — I2510 Atherosclerotic heart disease of native coronary artery without angina pectoris: Secondary | ICD-10-CM | POA: Diagnosis not present

## 2022-10-22 DIAGNOSIS — K219 Gastro-esophageal reflux disease without esophagitis: Secondary | ICD-10-CM | POA: Diagnosis present

## 2022-10-22 DIAGNOSIS — Z6831 Body mass index (BMI) 31.0-31.9, adult: Secondary | ICD-10-CM

## 2022-10-22 DIAGNOSIS — Z87891 Personal history of nicotine dependence: Secondary | ICD-10-CM

## 2022-10-22 DIAGNOSIS — Z634 Disappearance and death of family member: Secondary | ICD-10-CM

## 2022-10-22 DIAGNOSIS — I110 Hypertensive heart disease with heart failure: Secondary | ICD-10-CM | POA: Diagnosis present

## 2022-10-22 DIAGNOSIS — K31811 Angiodysplasia of stomach and duodenum with bleeding: Secondary | ICD-10-CM | POA: Diagnosis present

## 2022-10-22 DIAGNOSIS — E669 Obesity, unspecified: Secondary | ICD-10-CM | POA: Diagnosis present

## 2022-10-22 DIAGNOSIS — I255 Ischemic cardiomyopathy: Secondary | ICD-10-CM | POA: Diagnosis not present

## 2022-10-22 DIAGNOSIS — K2970 Gastritis, unspecified, without bleeding: Secondary | ICD-10-CM | POA: Diagnosis present

## 2022-10-22 DIAGNOSIS — K449 Diaphragmatic hernia without obstruction or gangrene: Secondary | ICD-10-CM | POA: Diagnosis present

## 2022-10-22 DIAGNOSIS — Z86010 Personal history of colonic polyps: Secondary | ICD-10-CM

## 2022-10-22 DIAGNOSIS — K2289 Other specified disease of esophagus: Secondary | ICD-10-CM | POA: Diagnosis not present

## 2022-10-22 DIAGNOSIS — H9193 Unspecified hearing loss, bilateral: Secondary | ICD-10-CM | POA: Diagnosis present

## 2022-10-22 DIAGNOSIS — I4892 Unspecified atrial flutter: Secondary | ICD-10-CM | POA: Diagnosis present

## 2022-10-22 DIAGNOSIS — Z9621 Cochlear implant status: Secondary | ICD-10-CM | POA: Diagnosis present

## 2022-10-22 DIAGNOSIS — E611 Iron deficiency: Secondary | ICD-10-CM | POA: Diagnosis not present

## 2022-10-22 LAB — BPAM RBC
Blood Product Expiration Date: 202408302359
Blood Product Expiration Date: 202408302359
ISSUE DATE / TIME: 202408062247
Unit Type and Rh: 6200
Unit Type and Rh: 6200

## 2022-10-22 LAB — PROTIME-INR
INR: 1.2 (ref 0.8–1.2)
Prothrombin Time: 15.7 seconds — ABNORMAL HIGH (ref 11.4–15.2)

## 2022-10-22 LAB — PREPARE RBC (CROSSMATCH)

## 2022-10-22 LAB — CBC WITH DIFFERENTIAL/PLATELET
Abs Immature Granulocytes: 0.02 10*3/uL (ref 0.00–0.07)
Basophils Absolute: 0 10*3/uL (ref 0.0–0.1)
Basophils Relative: 1 %
Eosinophils Absolute: 0.1 10*3/uL (ref 0.0–0.5)
Eosinophils Relative: 1 %
HCT: 21.5 % — ABNORMAL LOW (ref 36.0–46.0)
Hemoglobin: 5.7 g/dL — CL (ref 12.0–15.0)
Immature Granulocytes: 0 %
Lymphocytes Relative: 17 %
Lymphs Abs: 0.8 10*3/uL (ref 0.7–4.0)
MCH: 20.8 pg — ABNORMAL LOW (ref 26.0–34.0)
MCHC: 26.5 g/dL — ABNORMAL LOW (ref 30.0–36.0)
MCV: 78.5 fL — ABNORMAL LOW (ref 80.0–100.0)
Monocytes Absolute: 0.5 10*3/uL (ref 0.1–1.0)
Monocytes Relative: 10 %
Neutro Abs: 3.5 10*3/uL (ref 1.7–7.7)
Neutrophils Relative %: 71 %
Platelets: 229 10*3/uL (ref 150–400)
RBC: 2.74 MIL/uL — ABNORMAL LOW (ref 3.87–5.11)
RDW: 14.6 % (ref 11.5–15.5)
WBC: 4.9 10*3/uL (ref 4.0–10.5)
nRBC: 0 % (ref 0.0–0.2)

## 2022-10-22 LAB — TYPE AND SCREEN
ABO/RH(D): A POS
Antibody Screen: NEGATIVE
Unit division: 0
Unit division: 0

## 2022-10-22 LAB — RETICULOCYTES
Immature Retic Fract: 14.7 % (ref 2.3–15.9)
RBC.: 2.86 MIL/uL — ABNORMAL LOW (ref 3.87–5.11)
Retic Count, Absolute: 85.5 10*3/uL (ref 19.0–186.0)
Retic Ct Pct: 3 % (ref 0.4–3.1)

## 2022-10-22 LAB — IRON AND TIBC
Iron: 20 ug/dL — ABNORMAL LOW (ref 28–170)
Saturation Ratios: 4 % — ABNORMAL LOW (ref 10.4–31.8)
TIBC: 500 ug/dL — ABNORMAL HIGH (ref 250–450)
UIBC: 480 ug/dL

## 2022-10-22 LAB — COMPREHENSIVE METABOLIC PANEL
ALT: 11 U/L (ref 0–44)
AST: 15 U/L (ref 15–41)
Albumin: 3.6 g/dL (ref 3.5–5.0)
Alkaline Phosphatase: 45 U/L (ref 38–126)
Anion gap: 8 (ref 5–15)
BUN: 16 mg/dL (ref 8–23)
CO2: 24 mmol/L (ref 22–32)
Calcium: 9.2 mg/dL (ref 8.9–10.3)
Chloride: 105 mmol/L (ref 98–111)
Creatinine, Ser: 0.61 mg/dL (ref 0.44–1.00)
GFR, Estimated: >60 mL/min (ref >60)
Glucose, Bld: 103 mg/dL — ABNORMAL HIGH (ref 70–99)
Potassium: 3.5 mmol/L (ref 3.5–5.1)
Sodium: 137 mmol/L (ref 135–145)
Total Bilirubin: 0.4 mg/dL (ref 0.3–1.2)
Total Protein: 6.8 g/dL (ref 6.5–8.1)

## 2022-10-22 LAB — TSH: TSH: 6.867 u[IU]/mL — ABNORMAL HIGH (ref 0.350–4.500)

## 2022-10-22 LAB — VITAMIN B12: Vitamin B-12: 720 pg/mL (ref 180–914)

## 2022-10-22 LAB — APTT: aPTT: 25 seconds (ref 24–36)

## 2022-10-22 LAB — FOLATE: Folate: 7.4 ng/mL (ref >5.9)

## 2022-10-22 LAB — FERRITIN: Ferritin: 4 ng/mL — ABNORMAL LOW (ref 11–307)

## 2022-10-22 LAB — POC OCCULT BLOOD, ED: Fecal Occult Bld: NEGATIVE

## 2022-10-22 MED ORDER — SODIUM CHLORIDE 0.9% IV SOLUTION: Freq: Once | INTRAVENOUS | Status: AC

## 2022-10-22 MED ORDER — SENNOSIDES-DOCUSATE SODIUM 8.6-50 MG PO TABS: 1.0000 | ORAL_TABLET | Freq: Every evening | ORAL | Status: DC | PRN

## 2022-10-22 MED ORDER — SODIUM CHLORIDE 0.9% FLUSH
3.0000 mL | Freq: Two times a day (BID) | INTRAVENOUS | Status: DC
  Administered 2022-10-22 – 2022-10-26 (×9): 3 mL via INTRAVENOUS

## 2022-10-22 MED ORDER — SODIUM CHLORIDE 0.9% IV SOLUTION: Freq: Once | INTRAVENOUS | Status: DC

## 2022-10-22 MED ORDER — NITROGLYCERIN 0.4 MG SL SUBL: 0.4000 mg | SUBLINGUAL_TABLET | SUBLINGUAL | Status: DC | PRN

## 2022-10-22 MED ORDER — ONDANSETRON HCL 4 MG PO TABS: 4.0000 mg | ORAL_TABLET | Freq: Four times a day (QID) | ORAL | Status: DC | PRN

## 2022-10-22 MED ORDER — PANTOPRAZOLE SODIUM 40 MG IV SOLR
40.0000 mg | Freq: Once | INTRAVENOUS | Status: AC
  Administered 2022-10-22: 40 mg via INTRAVENOUS
  Filled 2022-10-22: qty 10

## 2022-10-22 MED ORDER — HYDRALAZINE HCL 20 MG/ML IJ SOLN
5.0000 mg | Freq: Three times a day (TID) | INTRAMUSCULAR | Status: DC | PRN
  Filled 2022-10-22: qty 1

## 2022-10-22 MED ORDER — ROSUVASTATIN CALCIUM 5 MG PO TABS: 5.0000 mg | ORAL_TABLET | Freq: Every day | ORAL | Status: DC

## 2022-10-22 MED ORDER — SODIUM CHLORIDE 0.9% FLUSH: 3.0000 mL | INTRAVENOUS | Status: DC | PRN

## 2022-10-22 MED ORDER — SODIUM CHLORIDE 0.9 % IV SOLN: 250.0000 mL | INTRAVENOUS | Status: DC | PRN

## 2022-10-22 MED ORDER — DEXTROSE IN LACTATED RINGERS 5 % IV SOLN: INTRAVENOUS | Status: AC

## 2022-10-22 MED ORDER — ACETAMINOPHEN 325 MG PO TABS
650.0000 mg | ORAL_TABLET | Freq: Four times a day (QID) | ORAL | Status: DC | PRN
  Administered 2022-10-25: 650 mg via ORAL
  Filled 2022-10-22: qty 2

## 2022-10-22 MED ORDER — LISINOPRIL 20 MG PO TABS
40.0000 mg | ORAL_TABLET | Freq: Every day | ORAL | Status: DC
  Administered 2022-10-23 – 2022-10-26 (×4): 40 mg via ORAL
  Filled 2022-10-22 (×4): qty 2

## 2022-10-22 MED ORDER — PANTOPRAZOLE SODIUM 40 MG IV SOLR
40.0000 mg | Freq: Two times a day (BID) | INTRAVENOUS | Status: DC
  Administered 2022-10-23 – 2022-10-24 (×3): 40 mg via INTRAVENOUS
  Filled 2022-10-22 (×4): qty 10

## 2022-10-22 MED ORDER — CYANOCOBALAMIN 500 MCG PO TABS
500.0000 ug | ORAL_TABLET | Freq: Every day | ORAL | Status: DC
  Administered 2022-10-22 – 2022-10-26 (×4): 500 ug via ORAL
  Filled 2022-10-22 (×4): qty 1

## 2022-10-22 MED ORDER — LEVOTHYROXINE SODIUM 100 MCG PO TABS
100.0000 ug | ORAL_TABLET | Freq: Every day | ORAL | Status: DC
  Administered 2022-10-23 – 2022-10-26 (×3): 100 ug via ORAL
  Filled 2022-10-22 (×3): qty 1

## 2022-10-22 MED ORDER — METOPROLOL SUCCINATE ER 50 MG PO TB24
50.0000 mg | ORAL_TABLET | Freq: Every day | ORAL | Status: DC
  Administered 2022-10-23 – 2022-10-26 (×4): 50 mg via ORAL
  Filled 2022-10-22 (×4): qty 1

## 2022-10-22 MED ORDER — SODIUM CHLORIDE 0.9 % IV SOLN
510.0000 mg | INTRAVENOUS | Status: DC
  Filled 2022-10-22: qty 17

## 2022-10-22 MED ORDER — IRON SUCROSE 500 MG IVPB - SIMPLE MED
500.0000 mg | INTRAVENOUS | Status: AC
  Administered 2022-10-22: 500 mg via INTRAVENOUS
  Filled 2022-10-22: qty 25

## 2022-10-22 MED ORDER — SENNOSIDES-DOCUSATE SODIUM 8.6-50 MG PO TABS
1.0000 | ORAL_TABLET | Freq: Every day | ORAL | Status: DC
  Administered 2022-10-22 – 2022-10-25 (×4): 1 via ORAL
  Filled 2022-10-22 (×4): qty 1

## 2022-10-22 MED ORDER — ONDANSETRON HCL 4 MG/2ML IJ SOLN: 4.0000 mg | Freq: Four times a day (QID) | INTRAMUSCULAR | Status: DC | PRN

## 2022-10-22 MED ORDER — POLYSACCHARIDE IRON COMPLEX 150 MG PO CAPS
150.0000 mg | ORAL_CAPSULE | Freq: Two times a day (BID) | ORAL | Status: DC
  Administered 2022-10-22 – 2022-10-26 (×7): 150 mg via ORAL
  Filled 2022-10-22 (×7): qty 1

## 2022-10-22 MED ORDER — ACETAMINOPHEN 650 MG RE SUPP: 650.0000 mg | Freq: Four times a day (QID) | RECTAL | Status: DC | PRN

## 2022-10-22 NOTE — H&P (Addendum)
History and Physical    [name] ZOX:[id number] DOB: 05-11-1940 DOA: 10/22/2022  PCP: Geoffry Paradise, MD   Patient coming from: Home   Chief Complaint:  Chief Complaint  Patient presents with   Fatigue    HPI:  [name] is a 82 y.o. female with medical history significant of CAD with previous STMI stent placed in 2015, atrial flutter daily on xarelto and Toprol-XL,  essential hypertension, hyperlipidemia, hypothyroidism, diastolic heart failure with preserved EF 60 to 65%, and cochlear implant presented to emergency department complaining of dropping of hemoglobin over the course of last few months.  She has seen her primary care provider on July 29 and hemoglobin and found low 7.  She received outpatient iron transfusion.  Patient seen her primary care office today and hemoglobin now 5.5.  She was referred to ED for further evaluation.  Patient denies any bright red blood per rectum but has noticed dark black stool. During my evaluation, patient declines any abdominal pain, nausea, vomiting, noticing bright red per rectum and no history of previous GI bleed.  Patient denies any dizziness, shortness of breath, palpitation, and generalized fatigue/malaise.  Patient reported she has not called her cardiology clinic who recommended to hold the Xarelto that she did not take last night 8/5. Amlodipine has been stopped recently because of worsening lower extremities swelling.  Per chart review patient has been last evaluated by [facility] Dr. Marina Goodell for low hemoglobin in the setting of Hemoccult positive stool test.  Patient has normal EGD and colonoscopy finding.    ED Course:  Initial presentation patient is hemodynamically stable except elevated blood pressure 177/52.  Heart rate 75, respiratory 17 O2 sat 100% room air. Fecal occult blood test negative. CMP grossly unremarkable. CBC showed low hemoglobin of 5.7 (baseline around 14 2 years ago 02/2021), low HCT 21, low MCV  78.  Normal WBC count 4.9 and platelet 229. ED physician Dr. Lynelle Doctor ordered unit of blood transfusion and obtaining anemia panel. ED physician discussed case with [facility] Dr. Tomasa Rand recommended checking the anemia panel before blood transfusion and they will evaluate the patient soon.   Review of Systems:  Review of Systems  Constitutional:  Negative for chills, fever, malaise/fatigue and weight loss.  Respiratory:  Negative for cough, sputum production and shortness of breath.   Cardiovascular:  Negative for chest pain, palpitations, orthopnea and leg swelling.  Gastrointestinal:  Negative for abdominal pain, constipation, diarrhea, heartburn, nausea and vomiting.  Genitourinary:  Negative for dysuria and urgency.  Musculoskeletal:  Negative for myalgias.  Neurological:  Negative for dizziness and headaches.  Endo/Heme/Allergies:  Negative for environmental allergies and polydipsia. Does not bruise/bleed easily.  Psychiatric/Behavioral:  The patient is not nervous/anxious.     Past Medical History:  Diagnosis Date   Acute inferolateral myocardial infarction (HCC) 03/24/2013   Acute MI, inferoposterior wall, initial episode of care (HCC) 03/28/2013   Acute myocardial infarction of other inferior wall, initial episode of care    Amnesia, global, transient    Arthritis    needs left knee replacement   Blood transfusion without reported diagnosis    CAD (coronary artery disease)    a. 03/2013 Infpost STEMI/PCI: LM nl, LAD min irregs, LCX 100 (3.0x12 Vision BMS), RCA  mild ostial dzs, EF 55%.   Essential hypertension, benign    Fibroids    a. uterine - spotting noted since 03/11/2013.   GERD 11/19/2007   Qualifier: Diagnosis of  By: Marina Goodell MD, Wilhemina Bonito  GERD (gastroesophageal reflux disease)    Hearing loss    a. s/p cochlear implant on right, hearing aid on left.   Hernia of anterior abdominal wall    History of cardiovascular stress test    Lexiscan Myoview 8/16:  EF 58%,  inferolateral scar, no ischemia; Low Risk   Hyperlipidemia    Hypothyroidism    Ischemic cardiomyopathy    Echo 7/16:  Septal and posterior lateral HK, EF 45-50%, mild LAE   IV Contrast Allergy    Near syncope 06/26/2014   Obesity    Old MI (myocardial infarction) 03/03/2014   Retrograde amnesia 06/26/2014   Rosacea    Ventricular tachycardia (HCC)    a. Immediate post pci/MI - no complications, on BB.    Past Surgical History:  Procedure Laterality Date   CERVICAL POLYPECTOMY     2010   CHOLECYSTECTOMY     COCHLEAR IMPLANT     LEFT HEART CATHETERIZATION WITH CORONARY ANGIOGRAM N/A 03/25/2013   Procedure: LEFT HEART CATHETERIZATION WITH CORONARY ANGIOGRAM;  Surgeon: Kathleene Hazel, MD;  Location: [facility] CATH LAB;  Service: Cardiovascular;  Laterality: N/A;   ROBOTIC ASSISTED TOTAL HYSTERECTOMY WITH BILATERAL SALPINGO OOPHERECTOMY Bilateral 08/16/2014   Procedure: ROBOTIC ASSISTED TOTAL HYSTERECTOMY WITH BILATERAL SALPINGO OOPHORECTOMY ;  Surgeon: Adolphus Birchwood, MD;  Location: [facility] ORS;  Service: Gynecology;  Laterality: Bilateral;   UPPER GASTROINTESTINAL ENDOSCOPY     uterine polyp     had D and C done to remove the polyp     reports that she quit smoking about 54 years ago. Her smoking use included cigarettes. She started smoking about 56 years ago. She has a 0.4 pack-year smoking history. She has never used smokeless tobacco. She reports that she does not drink alcohol and does not use drugs.  Allergies  Allergen Reactions   Bee Venom Itching and Swelling   Contrast Media [Iodinated Contrast Media] Hives    dye   Fish Allergy Anaphylaxis    Shell fish   Iohexol Hives    Family History  Problem Relation Age of Onset   Heart failure Mother        died in her 90's.   Cancer Mother    Diabetes Mother    Heart disease Mother    Hypertension Mother    Heart attack Mother    Other Father        died in WWII   Hyperlipidemia Sister    Diabetes Sister    Heart attack  Maternal Grandmother    Colon cancer Cousin    Esophageal cancer Neg Hx    Stomach cancer Neg Hx    Stroke Neg Hx    Rectal cancer Neg Hx     Prior to Admission medications   Medication Sig Start Date End Date Taking? Authorizing Provider  amLODipine (NORVASC) 5 MG tablet Take 1 tablet (5 mg total) by mouth daily. 04/08/22   Corky Crafts, MD  Calcium Carb-Cholecalciferol (CALCIUM + D3) 600-200 MG-UNIT TABS Take 1 tablet by mouth every morning.     [provider]  COVID-19 mRNA vaccine [phone] (COMIRNATY) SUSP injection Inject into the muscle. 12/27/21   Judyann Munson, MD  Cyanocobalamin (VITAMIN B 12 PO) Take 1,000 tablets by mouth daily.    [provider]  furosemide (LASIX) 20 MG tablet Take 1 tablet (20 mg total) by mouth daily. 03/14/21   Corky Crafts, MD  levothyroxine (SYNTHROID, LEVOTHROID) 100 MCG tablet Take 100 mcg by mouth daily before  breakfast.    [provider]  lisinopril (ZESTRIL) 40 MG tablet TAKE ONE TABLET BY MOUTH DAILY 04/17/22   Corky Crafts, MD  metoprolol succinate (TOPROL-XL) 50 MG 24 hr tablet TAKE 1 & 1/2 TABLETS BY MOUTH DAILY take WITH OR immediately following a meal 04/17/22   Corky Crafts, MD  metroNIDAZOLE (METROGEL) 0.75 % gel Apply 1 application topically daily. Patient places on her cheeks and nose, only about once or twice a week depending on the climate.    [provider]  nitroGLYCERIN (NITROSTAT) 0.4 MG SL tablet DISSOLVE ONE TABLET UNDER THE TONGUE AS NEEDED FOR CHEST PAIN MAY REPEAT IN FIVE MINUTES FOR TWO DOSES 04/17/22   Corky Crafts, MD  pantoprazole (PROTONIX) 40 MG tablet Take 1 tablet (40 mg total) by mouth daily. Please call to schedule an overdue appointment with Dr. Eldridge Dace for refills, [phone], thank you. 1st attempt. 10/29/21   Corky Crafts, MD  potassium chloride SA (KLOR-CON M) 20 MEQ tablet Take 1 tablet (20 mEq total) by mouth daily. 03/14/21    Corky Crafts, MD  rivaroxaban (XARELTO) 20 MG TABS tablet Take 1 tablet (20 mg total) by mouth daily with supper. 12/24/19   Marguerita Merles Latif, DO  rosuvastatin (CRESTOR) 5 MG tablet Take 1 tablet (5 mg total) by mouth daily. 04/03/21   Awilda Metro, RPH-CPP     Physical Exam: Vitals:   10/22/22 1557 10/22/22 1615 10/22/22 1918 10/22/22 1939  BP: (!) 165/75 (!) 177/52 (!) 173/66   Pulse: 76 75 66   Resp: 17 19 19    Temp: 98.9 F (37.2 C)   98.3 F (36.8 C)  TempSrc: Oral   Oral  SpO2: 100% 100% 100%     Physical Exam Constitutional:      General: She is not in acute distress.    Appearance: She is not ill-appearing.  HENT:     Nose: Nose normal.     Mouth/Throat:     Mouth: Mucous membranes are moist.  Eyes:     Pupils: Pupils are equal, round, and reactive to light.  Cardiovascular:     Rate and Rhythm: Normal rate and regular rhythm.     Pulses: Normal pulses.     Heart sounds: Normal heart sounds.  Pulmonary:     Effort: Pulmonary effort is normal.     Breath sounds: Normal breath sounds.  Abdominal:     General: Bowel sounds are normal. There is no distension.     Palpations: Abdomen is soft.     Tenderness: There is no abdominal tenderness. There is no guarding or rebound.  Musculoskeletal:     Cervical back: Neck supple.  Skin:    Capillary Refill: Capillary refill takes less than 2 seconds.  Neurological:     Mental Status: She is alert and oriented to person, place, and time.  Psychiatric:        Mood and Affect: Mood normal.        Thought Content: Thought content normal.        Judgment: Judgment normal.      Labs on Admission: I have personally reviewed following labs and imaging studies  CBC: Recent Labs  Lab 10/22/22 1732  WBC 4.9  NEUTROABS 3.5  HGB 5.7*  HCT 21.5*  MCV 78.5*  PLT 229   Basic Metabolic Panel: Recent Labs  Lab 10/22/22 1732  NA 137  K 3.5  CL 105  CO2 24  GLUCOSE 103*  BUN 16  CREATININE 0.61   CALCIUM 9.2   GFR: CrCl cannot be calculated (Unknown ideal weight.). Liver Function Tests: Recent Labs  Lab 10/22/22 1732  AST 15  ALT 11  ALKPHOS 45  BILITOT 0.4  PROT 6.8  ALBUMIN 3.6   No results for input(s): "LIPASE", "AMYLASE" in the last 168 hours. No results for input(s): "AMMONIA" in the last 168 hours. Coagulation Profile: No results for input(s): "INR", "PROTIME" in the last 168 hours. Cardiac Enzymes: No results for input(s): "CKTOTAL", "CKMB", "CKMBINDEX", "TROPONINI", "TROPONINIHS" in the last 168 hours. BNP (last 3 results) No results for input(s): "BNP" in the last 8760 hours. HbA1C: No results for input(s): "HGBA1C" in the last 72 hours. CBG: No results for input(s): "GLUCAP" in the last 168 hours. Lipid Profile: No results for input(s): "CHOL", "HDL", "LDLCALC", "TRIG", "CHOLHDL", "LDLDIRECT" in the last 72 hours. Thyroid Function Tests: No results for input(s): "TSH", "T4TOTAL", "FREET4", "T3FREE", "THYROIDAB" in the last 72 hours. Anemia Panel: No results for input(s): "VITAMINB12", "FOLATE", "FERRITIN", "TIBC", "IRON", "RETICCTPCT" in the last 72 hours. Urine analysis:    Component Value Date/Time   COLORURINE STRAW (A) 12/22/2019 1136   APPEARANCEUR CLEAR 12/22/2019 1136   LABSPEC 1.006 12/22/2019 1136   PHURINE 7.0 12/22/2019 1136   GLUCOSEU NEGATIVE 12/22/2019 1136   HGBUR NEGATIVE 12/22/2019 1136   BILIRUBINUR NEGATIVE 12/22/2019 1136   KETONESUR NEGATIVE 12/22/2019 1136   PROTEINUR NEGATIVE 12/22/2019 1136   UROBILINOGEN 0.2 08/17/2014 0919   NITRITE NEGATIVE 12/22/2019 1136   LEUKOCYTESUR NEGATIVE 12/22/2019 1136    Radiological Exams on Admission: I have personally reviewed images No results found.  EKG: My personal interpretation of EKG shows: Sinus rhythm heart rate 81.  No evidence of ST and T wave abnormality    Assessment/Plan: Principal Problem:   Acute on chronic anemia Active Problems:   Chronic iron deficiency  anemia   Essential hypertension   Hypothyroidism   Hyperlipidemia   H/O atrial flutter on Xarelto.   Chronic diastolic CHF (congestive heart failure) preserved EF 60-65 (HCC)    Assessment and Plan: Acute on chronic anemia History of microcytic anemia History of iron deficiency anemia -Patient coming with complaining of low hemoglobin 5.6 which was checked at outpatient primary care office.  She was referred to ED for evaluation.  Patient denies any evidence of overt GI bleed.  She is hemodynamically stable on presentation.  Abdominal exam unremarkable.  Patient has bowel movement this morning and able to pass flatus. - Lab work in the ED showed low hemoglobin 5.7, hematocrit 21, MCV 78.5, low MCH 28.8 and low MCHC 26.5.  Hemoccult-fecal test negative. -In the ED anemia panel has been ordered. -Per chart review previous colonoscopy 01/2021 showed transverse colon polyp and rectal polyp and EGD from 11/2007 normal findings. - ED physician reached out to [facility] Dr. Tomasa Rand recommended checking anemia panel specifically check ferritin before doing blood transfusion. -Dr. Tomasa Rand informed daytime GI Dr. Meridee Score said unlikely EGD tomorrow but it will be done before Thursday and keep patient NPO. -Pending ferritin, iron, TIBC, and folate level.  Pending reticulocyte count.  If low ferritin IV iron infusion as well. -Continue IV Protonix 40 mg twice daily for now until EGD and colonoscopy rules out any source of bleeding. -Plan for transfused 2 units of blood now. -Checking H&H every 6 hours and will transfuse as needed if hemoglobin drops less than 8. -Keeping patient n.p.o. after midnight. - Continue maintenance fluid D5 LR 75 cc/h after  midnight. -If EGD and colonoscopy does not showed any evidence of GI bleed will reach out and consult inpatient hematology for further evaluation. -Appreciate GI input    Essential hypertension Systolic heart failure with preserved EF 60 to  65% -At home patient is currently on lisinopril 40 mg daily, Lasix 20 mg daily and Toprol-XL 50 mg daily.  Recently amlodipine has been taken off given patient has bilateral lower extremity nonpitting edema.  Patient reported taking lisinopril and Toprol-XL this morning. -Continue home med lisinopril and Toprol-XL - Added hydralazine 5 mg as needed every 6 hours for systolic blood pressure more than 160  Atrial flutter currently on Xarelto - EKG showed normal sinus rhythm heart rate 81 - Previously patient was on  warfarin which was switched to Xarelto due to history of Hemoccult positive in the past.  Patient called her cardiologist yesterday 8/5 and recommended to stop the Xarelto in the setting of low hemoglobin 5.6 which was found on outpatient lab - In the ED hemoglobin 5.7.  No evidence of overt GI bleed - Continue to hold Xarelto until find the source of bleeding - Currently patient is rate controlled with Toprol-XL 50 mg daily - Continue home Toprol-XL 50 mg daily in the a.m. -Cardiac monitoring for development of any arrhythmia  History of CAD s/p BMS left circumflex in 2015 Hyperlipidemia -Per chart review patient has STEMI 03/2013 treated with bare-metal stent placement of the left circumflex.  Previously she was on warfarin which has been transitioned to Xarelto currently on 20 mg daily. -Holding Xarelto in the setting of low hemoglobin 5.6. -Continue Toprol-XL 50 mg daily and and Crestor 5 mg daily  Hypothyroidism -Resumed home levothyroxine 100 mcg daily before breakfast  DVT prophylaxis:  SCDs Code Status:  Full Code Diet: Clear liquid diet for now and n.p.o. after midnight. Family Communication: Discussed treatment plan with patient and family member at the bedside Disposition Plan: Pending GI evaluation.  Tentative discharge to home next 4 to 5 days. Consults: Gastroenterology Admission status:   Inpatient, progressive unit  Severity of Illness: The appropriate patient  status for this patient is INPATIENT. Inpatient status is judged to be reasonable and necessary in order to provide the required intensity of service to ensure the patient's safety. The patient's presenting symptoms, physical exam findings, and initial radiographic and laboratory data in the context of their chronic comorbidities is felt to place them at high risk for further clinical deterioration. Furthermore, it is not anticipated that the patient will be medically stable for discharge from the hospital within 2 midnights of admission.   * I certify that at the point of admission it is my clinical judgment that the patient will require inpatient hospital care spanning beyond 2 midnights from the point of admission due to high intensity of service, high risk for further deterioration and high frequency of surveillance required.Marland Kitchen    Tereasa Coop, MD [facility]  How to contact the [facility] Attending or Consulting provider 7A - 7P or covering provider during after hours 7P -7A, for this patient.  Check the care team in [facility] and look for a) attending/consulting [facility] provider listed and b) the [facility] team listed Log into [email or web address] and use [place]'s universal password to access. If you do not have the password, please contact the hospital operator. Locate the [facility] provider you are looking for under [facility] and page to a number that you can be directly reached. If you still have difficulty reaching the provider, please  page the [facility] (Director on Call) for the Hospitalists listed on amion for assistance.  10/22/2022, 8:03 PM

## 2022-10-22 NOTE — Progress Notes (Addendum)
  Lab check showed significantly low hemoglobin 5.7 and low ferritin 4 ng/mL even though patient is on Nferex 150 mg twice daily at home.  Low iron 20, elevated TIBC 500 and iron saturation less than 4% indicating iron deficiency anemia.  Per [facility] calculation patient is 1.3 g iron deficient yet. Plan: - Transfusing with Venofer 500 mg once today and patient also receiving 2 units of blood. -Continue home oral Nefirex 150 mg twice daily -Will recheck ferritin level into tomorrow.  Tereasa Coop, MD [facility] 10/22/2022, 11:05 PM

## 2022-10-22 NOTE — ED Notes (Signed)
ED TO INPATIENT HANDOFF REPORT  Name/Age/Gender [name] 82 y.o. female  Code Status    Code Status Orders  (From admission, onward)           Start     Ordered   10/22/22 1935  Full code  Continuous       Question:  By:  Answer:  Consent: discussion documented in EHR   10/22/22 1937           Code Status History     Date Active Date Inactive Code Status Order ID Comments User Context   12/22/2019 1138 12/23/2019 2359 Full Code [id number]  Leatha Gilding, MD ED   08/16/2014 1530 08/18/2014 1423 Full Code [id number]  Antionette Char, MD Inpatient   06/26/2014 0125 06/30/2014 1619 Full Code [id number]  Hillary Bow, DO ED   03/24/2013 2011 03/28/2013 1454 Full Code [id number]  Ok Anis, NP Inpatient       Home/SNF/Other Home  Chief Complaint Acute on chronic anemia [D64.9]  Level of Care/Admitting Diagnosis ED Disposition     ED Disposition  Admit   Condition  --   Comment  H[facility]: [facility] [[id number]]  Level of Care: Progressive [102]  Admit to Progressive based on following criteria: GI, ENDOCRINE disease patients with GI bleeding, acute liver failure or pancreatitis, stable with diabetic ketoacidosis or thyrotoxicosis (hypothyroid) state.  Admit to Progressive based on following criteria: Other see comments  Comments: Low hemoglobin 5.6.  May admit patient to [facility] or [facility] if equivalent level of care is available:: No  Covid Evaluation: Asymptomatic - no recent exposure (last 10 days) testing not required  Diagnosis: Acute on chronic anemia [6295284]  Admitting Physician: Tereasa Coop [[id number]]  Attending Physician: Tereasa Coop [[id number]]  Certification:: I certify this patient will need inpatient services for at least 2 midnights  Estimated Length of Stay: 7          Medical History Past Medical History:  Diagnosis Date   Acute inferolateral myocardial infarction (HCC) 03/24/2013    Acute MI, inferoposterior wall, initial episode of care (HCC) 03/28/2013   Acute myocardial infarction of other inferior wall, initial episode of care    Amnesia, global, transient    Arthritis    needs left knee replacement   Blood transfusion without reported diagnosis    CAD (coronary artery disease)    a. 03/2013 Infpost STEMI/PCI: LM nl, LAD min irregs, LCX 100 (3.0x12 Vision BMS), RCA  mild ostial dzs, EF 55%.   Essential hypertension, benign    Fibroids    a. uterine - spotting noted since 03/11/2013.   GERD 11/19/2007   Qualifier: Diagnosis of  By: Marina Goodell MD, Wilhemina Bonito    GERD (gastroesophageal reflux disease)    Hearing loss    a. s/p cochlear implant on right, hearing aid on left.   Hernia of anterior abdominal wall    History of cardiovascular stress test    Lexiscan Myoview 8/16:  EF 58%, inferolateral scar, no ischemia; Low Risk   Hyperlipidemia    Hypothyroidism    Ischemic cardiomyopathy    Echo 7/16:  Septal and posterior lateral HK, EF 45-50%, mild LAE   IV Contrast Allergy    Near syncope 06/26/2014   Obesity    Old MI (myocardial infarction) 03/03/2014   Retrograde amnesia 06/26/2014   Rosacea    Ventricular tachycardia (HCC)    a. Immediate post pci/MI - no complications, on BB.  Allergies Allergies  Allergen Reactions   Bee Venom Itching and Swelling   Contrast Media [Iodinated Contrast Media] Hives    dye   Fish Allergy Anaphylaxis    Shell fish   Iohexol Hives    IV Location/Drains/Wounds Patient Lines/Drains/Airways Status     Active Line/Drains/Airways     Name Placement date Placement time Site Days   Peripheral IV 10/22/22 20 G Left Antecubital 10/22/22  1729  Antecubital  less than 1   Peripheral IV 10/22/22 22 G Anterior;Right Forearm 10/22/22  2236  Forearm  less than 1            Labs/Imaging Results for orders placed or performed during the hospital encounter of 10/22/22 (from the past 48 hour(s))  POC occult blood, ED  Provider will collect     Status: None   Collection Time: 10/22/22  4:59 PM  Result Value Ref Range   Fecal Occult Bld NEGATIVE NEGATIVE  Comprehensive metabolic panel     Status: Abnormal   Collection Time: 10/22/22  5:32 PM  Result Value Ref Range   Sodium 137 135 - 145 mmol/L   Potassium 3.5 3.5 - 5.1 mmol/L   Chloride 105 98 - 111 mmol/L   CO2 24 22 - 32 mmol/L   Glucose, Bld 103 (H) 70 - 99 mg/dL    Comment: Glucose reference range applies only to samples taken after fasting for at least 8 hours.   BUN 16 8 - 23 mg/dL   Creatinine, Ser 7.84 0.44 - 1.00 mg/dL   Calcium 9.2 8.9 - 69.6 mg/dL   Total Protein 6.8 6.5 - 8.1 g/dL   Albumin 3.6 3.5 - 5.0 g/dL   AST 15 15 - 41 U/L   ALT 11 0 - 44 U/L   Alkaline Phosphatase 45 38 - 126 U/L   Total Bilirubin 0.4 0.3 - 1.2 mg/dL   GFR, Estimated >29 >52 mL/min    Comment: (NOTE) Calculated using the CKD-EPI Creatinine Equation (2021)    Anion gap 8 5 - 15    Comment: Performed at [facility], [address] [place], [place] [address]  CBC with Differential     Status: Abnormal   Collection Time: 10/22/22  5:32 PM  Result Value Ref Range   WBC 4.9 4.0 - 10.5 K/uL   RBC 2.74 (L) 3.87 - 5.11 MIL/uL   Hemoglobin 5.7 (LL) 12.0 - 15.0 g/dL    Comment: REPEATED TO VERIFY THIS CRITICAL RESULT HAS VERIFIED AND BEEN CALLED TO A.WILSON, RN BY NATHAN THOMPSON ON 08 06 2024 AT 1807, AND HAS BEEN READ BACK. CRITICAL RESULT VERIFIED    HCT 21.5 (L) 36.0 - 46.0 %   MCV 78.5 (L) 80.0 - 100.0 fL   MCH 20.8 (L) 26.0 - 34.0 pg   MCHC 26.5 (L) 30.0 - 36.0 g/dL   RDW 44.0 10.2 - 72.5 %   Platelets 229 150 - 400 K/uL   nRBC 0.0 0.0 - 0.2 %   Neutrophils Relative % 71 %   Neutro Abs 3.5 1.7 - 7.7 K/uL   Lymphocytes Relative 17 %   Lymphs Abs 0.8 0.7 - 4.0 K/uL   Monocytes Relative 10 %   Monocytes Absolute 0.5 0.1 - 1.0 K/uL   Eosinophils Relative 1 %   Eosinophils Absolute 0.1 0.0 - 0.5 K/uL   Basophils Relative 1 %    Basophils Absolute 0.0 0.0 - 0.1 K/uL   Immature Granulocytes 0 %   Abs Immature  Granulocytes 0.02 0.00 - 0.07 K/uL    Comment: Performed at [facility], [address] [place], [place] [address]  Type and screen [facility]     Status: None (Preliminary result)   Collection Time: 10/22/22  5:32 PM  Result Value Ref Range   ABO/RH(D) A POS    Antibody Screen NEG    Sample Expiration 10/25/2022,2359    Unit Number [id number]    Blood Component Type RED CELLS,LR    Unit division 00    Status of Unit ISSUED    Transfusion Status OK TO TRANSFUSE    Crossmatch Result      Compatible Performed at [facility], [address], [place], [place] [address]    Unit Number [id number]    Blood Component Type RED CELLS,LR    Unit division 00    Status of Unit ALLOCATED    Transfusion Status OK TO TRANSFUSE    Crossmatch Result Compatible   Prepare RBC (crossmatch)     Status: None   Collection Time: 10/22/22  6:33 PM  Result Value Ref Range   Order Confirmation      ORDER PROCESSED BY BLOOD BANK Performed at [facility], [address], [place], [place] [address]   Ferritin     Status: Abnormal   Collection Time: 10/22/22  7:42 PM  Result Value Ref Range   Ferritin 4 (L) 11 - 307 ng/mL    Comment: Performed at [facility], [address], [place], [place] [address]  Folate     Status: None   Collection Time: 10/22/22  7:42 PM  Result Value Ref Range   Folate 7.4 >5.9 ng/mL    Comment: Performed at [facility], [address] [place], [place] [address]  Iron and TIBC     Status: Abnormal   Collection Time: 10/22/22  7:42 PM  Result Value Ref Range   Iron 20 (L) 28 - 170 ug/dL   TIBC 272 (H) 536 - 644 ug/dL   Saturation Ratios 4 (L) 10.4 - 31.8 %   UIBC 480 ug/dL    Comment: Performed at [facility], [address], [place], [place]  [address]  Reticulocytes     Status: Abnormal   Collection Time: 10/22/22  7:42 PM  Result Value Ref Range   Retic Ct Pct 3.0 0.4 - 3.1 %    Comment: REPEATED TO VERIFY RESULTS VERIFIED BY MANUAL DILUTION    RBC. 2.86 (L) 3.87 - 5.11 MIL/uL   Retic Count, Absolute 85.5 19.0 - 186.0 K/uL   Immature Retic Fract 14.7 2.3 - 15.9 %    Comment: Performed at [facility], [address] [place], [place] [address]  Vitamin B12     Status: None   Collection Time: 10/22/22  7:42 PM  Result Value Ref Range   Vitamin B-12 720 180 - 914 pg/mL    Comment: (NOTE) This assay is not validated for testing neonatal or myeloproliferative syndrome specimens for Vitamin B12 levels. Performed at [facility], [address], [place], [place] [address]   TSH     Status: Abnormal   Collection Time: 10/22/22  7:42 PM  Result Value Ref Range   TSH 6.867 (H) 0.350 - 4.500 uIU/mL    Comment: Performed by a 3rd Generation assay with a functional sensitivity of <=0.01 uIU/mL. Performed at [facility], [address] [place],  [place] [address]   APTT     Status: None   Collection Time: 10/22/22  8:00 PM  Result Value Ref Range   aPTT 25 24 - 36 seconds    Comment: Performed at [facility], [address], [place], [place] [address]  Protime-INR     Status: Abnormal   Collection Time: 10/22/22  8:00 PM  Result Value Ref Range   Prothrombin Time 15.7 (H) 11.4 - 15.2 seconds   INR 1.2 0.8 - 1.2    Comment: (NOTE) INR goal varies based on device and disease states. Performed at [facility], [address], [place], [place] [address]    No results found.  Pending Labs Unresulted Labs (From admission, onward)     Start     Ordered   10/23/22 0500  Comprehensive metabolic panel  Daily,   R      10/22/22 1938   10/23/22 0100  Hemoglobin and hematocrit, blood  Every 6 hours,   R (with TIMED occurrences)       10/22/22 2007   10/22/22 2300  T3, free  Add-on,   AD        10/22/22 2259   10/22/22 2259  T4, free  Add-on,   AD        10/22/22 2258   10/22/22 2015  Prepare RBC (crossmatch)  (Blood Administration Adult)  Once,   R       Question Answer Comment  # of Units 2 units   Transfusion Indications Hemoglobin < 7 gm/dL and symptomatic   Number of Units to Keep Ahead NO units ahead   Instructions: Transfuse   If emergent release call blood bank Not emergent release      10/22/22 2015            Vitals/Pain Today's Vitals   10/22/22 2200 10/22/22 2230 10/22/22 2251 10/22/22 2309  BP: (!) 156/58 (!) 158/58  (!) 155/59  Pulse: 66 65  62  Resp: 16 (!) 21  18  Temp:   98.9 F (37.2 C) 98.6 F (37 C)  TempSrc:   Oral Oral  SpO2: 99% 99%  99%    Isolation Precautions No active isolations  Medications Medications  lisinopril (ZESTRIL) tablet 40 mg (has no administration in time range)  metoprolol succinate (TOPROL-XL) 24 hr tablet 50 mg (has no administration in time range)  nitroGLYCERIN (NITROSTAT) SL tablet 0.4 mg (has no administration in time range)  levothyroxine (SYNTHROID) tablet 100 mcg (has no administration in time range)  pantoprazole (PROTONIX) injection 40 mg (40 mg Intravenous Not Given 10/22/22 2227)  cyanocobalamin (VITAMIN B12) tablet 500 mcg (500 mcg Oral Given 10/22/22 2217)  dextrose 5 % in lactated ringers infusion (has no administration in time range)  sodium chloride flush (NS) 0.9 % injection 3 mL (3 mLs Intravenous Given 10/22/22 2227)  sodium chloride flush (NS) 0.9 % injection 3 mL (has no administration in time range)  0.9 %  sodium chloride infusion (has no administration in time range)  acetaminophen (TYLENOL) tablet 650 mg (has no administration in time range)    Or  acetaminophen (TYLENOL) suppository 650 mg (has no administration in time range)  ondansetron (ZOFRAN) tablet 4 mg (has no administration in time range)    Or  ondansetron (ZOFRAN)  injection 4 mg (has no administration in time range)  hydrALAZINE (APRESOLINE) injection 5 mg (has no administration in time range)  0.9 %  sodium chloride infusion (Manually program via Guardrails  IV Fluids) (has no administration in time range)  iron polysaccharides (NIFEREX) capsule 150 mg (150 mg Oral Given 10/22/22 2319)  senna-docusate (Senokot-S) tablet 1 tablet (1 tablet Oral Given 10/22/22 2319)  iron sucrose (VENOFER) 500 mg in sodium chloride 0.9 % 250 mL IVPB (has no administration in time range)  pantoprazole (PROTONIX) injection 40 mg (40 mg Intravenous Given 10/22/22 1730)  0.9 %  sodium chloride infusion (Manually program via Guardrails IV Fluids) ( Intravenous New Bag/Given 10/22/22 2301)    Mobility walks with device. Walker as needed.

## 2022-10-22 NOTE — ED Triage Notes (Signed)
Sent by PCP for hemoglobin of 5.5 today. Pt c/o fatigue, SHOB. Pt has been being treated for anemia with iron pills for the last month.

## 2022-10-22 NOTE — ED Provider Notes (Addendum)
[place] EMERGENCY DEPARTMENT AT [facility] Provider Note   CSN: [id number] Arrival date & time: 10/22/22  1550     History  Chief Complaint  Patient presents with   Fatigue    [name] is a 82 y.o. female.  HPI   Patient has a history of acid reflux, fibroids, coronary artery disease, hypertension, ischemic cardiomyopathy, anemia who presents to the ED for evaluation of blood count.  Patient states she has had a slow drop in her hemoglobin over the last couple months.  She has been seeing her primary care doctor.  She was seen around July 29 and her hemoglobin was low 7 at that time.  Plan swore for her to receive an iron infusion.  That was scheduled for the next week or 2.  Patient went to her doctor's office today to have her blood count rechecked and it was now 5.5.  She was sent to the ED for admission to the hospital and further treatment.  Patient states she has not noticed any bright red blood in her stool however she has noticed dark black stools.  Patient states her stools have also tested positive for blood at the doctor's office.  Home Medications Prior to Admission medications   Medication Sig Start Date End Date Taking? Authorizing Provider  amLODipine (NORVASC) 5 MG tablet Take 1 tablet (5 mg total) by mouth daily. 04/08/22   Corky Crafts, MD  Calcium Carb-Cholecalciferol (CALCIUM + D3) 600-200 MG-UNIT TABS Take 1 tablet by mouth every morning.     [provider]  COVID-19 mRNA vaccine [id number] (COMIRNATY) SUSP injection Inject into the muscle. 12/27/21   Judyann Munson, MD  Cyanocobalamin (VITAMIN B 12 PO) Take 1,000 tablets by mouth daily.    [provider]  furosemide (LASIX) 20 MG tablet Take 1 tablet (20 mg total) by mouth daily. 03/14/21   Corky Crafts, MD  levothyroxine (SYNTHROID, LEVOTHROID) 100 MCG tablet Take 100 mcg by mouth daily before breakfast.    [provider]  lisinopril (ZESTRIL) 40  MG tablet TAKE ONE TABLET BY MOUTH DAILY 04/17/22   Corky Crafts, MD  metoprolol succinate (TOPROL-XL) 50 MG 24 hr tablet TAKE 1 & 1/2 TABLETS BY MOUTH DAILY take WITH OR immediately following a meal 04/17/22   Corky Crafts, MD  metroNIDAZOLE (METROGEL) 0.75 % gel Apply 1 application topically daily. Patient places on her cheeks and nose, only about once or twice a week depending on the climate.    [provider]  nitroGLYCERIN (NITROSTAT) 0.4 MG SL tablet DISSOLVE ONE TABLET UNDER THE TONGUE AS NEEDED FOR CHEST PAIN MAY REPEAT IN FIVE MINUTES FOR TWO DOSES 04/17/22   Corky Crafts, MD  pantoprazole (PROTONIX) 40 MG tablet Take 1 tablet (40 mg total) by mouth daily. Please call to schedule an overdue appointment with Dr. Eldridge Dace for refills, [phone], thank you. 1st attempt. 10/29/21   Corky Crafts, MD  potassium chloride SA (KLOR-CON M) 20 MEQ tablet Take 1 tablet (20 mEq total) by mouth daily. 03/14/21   Corky Crafts, MD  rivaroxaban (XARELTO) 20 MG TABS tablet Take 1 tablet (20 mg total) by mouth daily with supper. 12/24/19   Marguerita Merles Latif, DO  rosuvastatin (CRESTOR) 5 MG tablet Take 1 tablet (5 mg total) by mouth daily. 04/03/21   Supple, Megan E, RPH-CPP      Allergies    Bee venom, Contrast media [iodinated contrast media], Fish allergy, and Iohexol  Review of Systems   Review of Systems  Physical Exam Updated Vital Signs BP (!) 173/66   Pulse 66   Temp 98.9 F (37.2 C) (Oral)   Resp 19   SpO2 100%  Physical Exam Vitals and nursing note reviewed.  Constitutional:      Appearance: She is well-developed. She is not diaphoretic.  HENT:     Head: Normocephalic and atraumatic.     Right Ear: External ear normal.     Left Ear: External ear normal.  Eyes:     General: No scleral icterus.       Right eye: No discharge.        Left eye: No discharge.     Conjunctiva/sclera: Conjunctivae normal.  Neck:     Trachea: No tracheal  deviation.  Cardiovascular:     Rate and Rhythm: Normal rate and regular rhythm.  Pulmonary:     Effort: Pulmonary effort is normal. No respiratory distress.     Breath sounds: Normal breath sounds. No stridor. No wheezing or rales.  Abdominal:     General: Bowel sounds are normal. There is no distension.     Palpations: Abdomen is soft.     Tenderness: There is no abdominal tenderness. There is no guarding or rebound.  Genitourinary:    Comments: Dark black stool noted on exam, no bright red blood no mass Musculoskeletal:        General: No tenderness or deformity.     Cervical back: Neck supple.  Skin:    General: Skin is warm and dry.     Findings: No rash.  Neurological:     General: No focal deficit present.     Mental Status: She is alert.     Cranial Nerves: No cranial nerve deficit, dysarthria or facial asymmetry.     Sensory: No sensory deficit.     Motor: No abnormal muscle tone or seizure activity.     Coordination: Coordination normal.  Psychiatric:        Mood and Affect: Mood normal.     ED Results / Procedures / Treatments   Labs (all labs ordered are listed, but only abnormal results are displayed) Labs Reviewed  COMPREHENSIVE METABOLIC PANEL - Abnormal; Notable for the following components:      Result Value   Glucose, Bld 103 (*)    All other components within normal limits  CBC WITH DIFFERENTIAL/PLATELET - Abnormal; Notable for the following components:   RBC 2.74 (*)    Hemoglobin 5.7 (*)    HCT 21.5 (*)    MCV 78.5 (*)    MCH 20.8 (*)    MCHC 26.5 (*)    All other components within normal limits  VITAMIN B12  FOLATE  IRON AND TIBC  FERRITIN  RETICULOCYTES  POC OCCULT BLOOD, ED  TYPE AND SCREEN  PREPARE RBC (CROSSMATCH)  TYPE AND SCREEN    EKG EKG Interpretation Date/Time:  Tuesday October 22 2022 15:59:31 EDT Ventricular Rate:  81 PR Interval:  160 QRS Duration:  102 QT Interval:  395 QTC Calculation: 459 R Axis:   26  Text  Interpretation: Sinus rhythm Low voltage, precordial leads Baseline wander in lead(s) III No significant change since last tracing Confirmed by Linwood Dibbles [phone]) on 10/22/2022 4:16:48 PM  Radiology No results found.  Procedures .Critical Care  Performed by: Linwood Dibbles, MD Authorized by: Linwood Dibbles, MD   Critical care provider statement:    Critical care time (minutes):  35  Critical care was time spent personally by me on the following activities:  Development of treatment plan with patient or surrogate, discussions with consultants, evaluation of patient's response to treatment, examination of patient, ordering and review of laboratory studies, ordering and review of radiographic studies, ordering and performing treatments and interventions, pulse oximetry, re-evaluation of patient's condition and review of old charts     Medications Ordered in ED Medications  0.9 %  sodium chloride infusion (Manually program via Guardrails IV Fluids) (has no administration in time range)  pantoprazole (PROTONIX) injection 40 mg (40 mg Intravenous Given 10/22/22 1730)    ED Course/ Medical Decision Making/ A&P Clinical Course as of 10/22/22 1923  Tue Oct 22, 2022  1718 Hemoccult negative [JK]  1831 Hemoglobin decreased at 5.7 [JK]  1845 Comprehensive metabolic panel(!) nl [JK]  1920 Case reviewed with Dr. Tomasa Rand.  Patient will be seen by GI team in the morning.  He requests anemia panel which I have added on prior to her bloods at her infusion [JK]  1920 Case discussed with Dr. Janalyn Shy regarding admission [JK]    Clinical Course User Index [JK] Linwood Dibbles, MD                                 Medical Decision Making Problems Addressed: Anemia, unspecified type: acute illness or injury that poses a threat to life or bodily functions  Amount and/or Complexity of Data Reviewed Labs: ordered. Decision-making details documented in ED Course.  Risk Prescription drug management. Decision  regarding hospitalization.   Patient presented to the ED for evaluation of anemia.  Patient reports having Hemoccult positive stools as an outpatient recently.  She has not noticed any bright red blood in her stool.  Patient states her blood count has been decreasing recently.  Her doctor noted it was down below 7 on July 29.  Patient is now down to 5.7 she remains stable in the ED.  Hemoccult negative today but I am still suspicious for possible occult GI bleeding.  I ordered 2 units of blood.  I have discussed case with GI and medical service admission for the treatment   Final Clinical Impression(s) / ED Diagnoses Final diagnoses:  Anemia, unspecified type    Rx / DC Orders ED Discharge Orders     None            Linwood Dibbles, MD 10/22/22 1924

## 2022-10-23 ENCOUNTER — Other Ambulatory Visit: Payer: Self-pay

## 2022-10-23 DIAGNOSIS — I10 Essential (primary) hypertension: Secondary | ICD-10-CM | POA: Diagnosis not present

## 2022-10-23 DIAGNOSIS — E039 Hypothyroidism, unspecified: Secondary | ICD-10-CM | POA: Diagnosis not present

## 2022-10-23 DIAGNOSIS — D649 Anemia, unspecified: Secondary | ICD-10-CM | POA: Diagnosis not present

## 2022-10-23 DIAGNOSIS — I5020 Unspecified systolic (congestive) heart failure: Secondary | ICD-10-CM | POA: Diagnosis not present

## 2022-10-23 DIAGNOSIS — D509 Iron deficiency anemia, unspecified: Secondary | ICD-10-CM | POA: Diagnosis not present

## 2022-10-23 DIAGNOSIS — I4892 Unspecified atrial flutter: Secondary | ICD-10-CM | POA: Diagnosis not present

## 2022-10-23 DIAGNOSIS — I5032 Chronic diastolic (congestive) heart failure: Secondary | ICD-10-CM | POA: Diagnosis not present

## 2022-10-23 LAB — HEMOGLOBIN AND HEMATOCRIT, BLOOD
HCT: 29.7 % — ABNORMAL LOW (ref 36.0–46.0)
HCT: 26.9 % — ABNORMAL LOW (ref 36.0–46.0)
Hemoglobin: 8.7 g/dL — ABNORMAL LOW (ref 12.0–15.0)
Hemoglobin: 7.9 g/dL — ABNORMAL LOW (ref 12.0–15.0)

## 2022-10-23 MED ORDER — POLYETHYLENE GLYCOL 3350 17 G PO PACK
17.0000 g | PACK | Freq: Every day | ORAL | Status: DC
  Administered 2022-10-23 – 2022-10-26 (×3): 17 g via ORAL
  Filled 2022-10-23 (×3): qty 1

## 2022-10-23 MED ORDER — HYDRALAZINE HCL 20 MG/ML IJ SOLN
5.0000 mg | Freq: Four times a day (QID) | INTRAMUSCULAR | Status: DC | PRN
  Administered 2022-10-23: 5 mg via INTRAVENOUS
  Filled 2022-10-23 (×2): qty 1

## 2022-10-23 MED ORDER — BISACODYL 5 MG PO TBEC: 10.0000 mg | DELAYED_RELEASE_TABLET | Freq: Once | ORAL | Status: DC

## 2022-10-23 MED ORDER — BISACODYL 5 MG PO TBEC
10.0000 mg | DELAYED_RELEASE_TABLET | Freq: Once | ORAL | Status: AC
  Administered 2022-10-23: 10 mg via ORAL
  Filled 2022-10-23: qty 2

## 2022-10-23 MED ORDER — SODIUM CHLORIDE 0.9 % IV SOLN
250.0000 mg | Freq: Every day | INTRAVENOUS | Status: DC
  Filled 2022-10-23: qty 20

## 2022-10-23 NOTE — Consult Note (Signed)
Consultation  Referring Provider:   Dr. David Stall Primary Care Physician:  Geoffry Paradise, MD Primary Gastroenterologist: Dr. Marina Goodell       Reason for Consultation: Anemia, previously Hemoccult positive            HPI:   [name] is a 82 y.o. female with a past medical history significant for CAD with previous stent placement in 2015, A-flutter on daily Xarelto, hypothyroidism, diastolic heart failure preserved EF 60-65% cochlear implant, who presented to the ED on 10/22/2022 complaining of a drop in hemoglobin over the last few months.    At time of admission patient described seeing her PCP on July 29 with a hemoglobin low at 7.  Apparently received outpatient iron transfusion and return to her PCPs office with a hemoglobin identified at 5.5.  She was sent to the ED.  She denied any overt GI bleeding.  At that time reported her last dose of Xarelto was the morning of 10/21/2022.    This morning, the patient describes that about a month and a half ago having her normal 64-month labs with her PCP she was told her hemoglobin had dropped to around 8, she was started on 2 capsules of iron a day and had a recheck of her labs about a month later and was found to drop to around 6.3 or so per her, she then began to have increasing weakness, dyspnea on exertion and fatigue feeling like she just could not function, she went back to her PCP and was told her hemoglobin was down in the fives regardless of iron supplementation.  She was told to come to the ER.  Does describe a longstanding history of reflux for which she has been on Pantoprazole what sounds like 40 mg every morning and does take occasional chewable antacids for breakthrough.  Otherwise has noticed that her stool got dark when starting the iron, but had not noticed any overt GI bleeding including bright red blood in her stool or black tarry sticky stools prior to this.  She is on Xarelto with her last dose 10/21/2022 a.m.    Denies fever,  chills, weight loss, nausea, vomiting or abdominal pain. Denies NSAID use.  GI history: 01/25/21 colonoscopy with a 1 mm polyp in the cecum, three 4-6 mm polyps in the transverse colon and ascending colon and diverticulosis in the left colon. 11/20/2007 EGD was normal  Past Medical History:  Diagnosis Date   Acute inferolateral myocardial infarction (HCC) 03/24/2013   Acute MI, inferoposterior wall, initial episode of care (HCC) 03/28/2013   Acute myocardial infarction of other inferior wall, initial episode of care    Amnesia, global, transient    Arthritis    needs left knee replacement   Blood transfusion without reported diagnosis    CAD (coronary artery disease)    a. 03/2013 Infpost STEMI/PCI: LM nl, LAD min irregs, LCX 100 (3.0x12 Vision BMS), RCA  mild ostial dzs, EF 55%.   Essential hypertension, benign    Fibroids    a. uterine - spotting noted since 03/11/2013.   GERD 11/19/2007   Qualifier: Diagnosis of  By: Marina Goodell MD, Wilhemina Bonito    GERD (gastroesophageal reflux disease)    Hearing loss    a. s/p cochlear implant on right, hearing aid on left.   Hernia of anterior abdominal wall    History of cardiovascular stress test    Lexiscan Myoview 8/16:  EF 58%, inferolateral scar, no ischemia; Low Risk  Hyperlipidemia    Hypothyroidism    Ischemic cardiomyopathy    Echo 7/16:  Septal and posterior lateral HK, EF 45-50%, mild LAE   IV Contrast Allergy    Near syncope 06/26/2014   Obesity    Old MI (myocardial infarction) 03/03/2014   Retrograde amnesia 06/26/2014   Rosacea    Ventricular tachycardia (HCC)    a. Immediate post pci/MI - no complications, on BB.    Past Surgical History:  Procedure Laterality Date   CERVICAL POLYPECTOMY     2010   CHOLECYSTECTOMY     COCHLEAR IMPLANT     LEFT HEART CATHETERIZATION WITH CORONARY ANGIOGRAM N/A 03/25/2013   Procedure: LEFT HEART CATHETERIZATION WITH CORONARY ANGIOGRAM;  Surgeon: Kathleene Hazel, MD;  Location: [facility] CATH LAB;   Service: Cardiovascular;  Laterality: N/A;   ROBOTIC ASSISTED TOTAL HYSTERECTOMY WITH BILATERAL SALPINGO OOPHERECTOMY Bilateral 08/16/2014   Procedure: ROBOTIC ASSISTED TOTAL HYSTERECTOMY WITH BILATERAL SALPINGO OOPHORECTOMY ;  Surgeon: Adolphus Birchwood, MD;  Location: [facility] ORS;  Service: Gynecology;  Laterality: Bilateral;   UPPER GASTROINTESTINAL ENDOSCOPY     uterine polyp     had D and C done to remove the polyp    Family History  Problem Relation Age of Onset   Heart failure Mother        died in her 28's.   Cancer Mother    Diabetes Mother    Heart disease Mother    Hypertension Mother    Heart attack Mother    Other Father        died in WWII   Hyperlipidemia Sister    Diabetes Sister    Heart attack Maternal Grandmother    Colon cancer Cousin    Esophageal cancer Neg Hx    Stomach cancer Neg Hx    Stroke Neg Hx    Rectal cancer Neg Hx     Social History   Tobacco Use   Smoking status: Former    Current packs/day: 0.00    Average packs/day: 0.2 packs/day for 2.0 years (0.4 ttl pk-yrs)    Types: Cigarettes    Start date: 03/18/1966    Quit date: 03/18/1968    Years since quitting: 54.6   Smokeless tobacco: Never  Vaping Use   Vaping status: Never Used  Substance Use Topics   Alcohol use: No   Drug use: No    Prior to Admission medications   Medication Sig Start Date End Date Taking? Authorizing Provider  Calcium Carb-Cholecalciferol (CALCIUM + D3) 600-200 MG-UNIT TABS Take 1 tablet by mouth every morning.    Yes [provider]  Cyanocobalamin (VITAMIN B 12 PO) Take 1,000 tablets by mouth daily.   Yes [provider]  FERREX 150 150 MG capsule Take 150 mg by mouth 2 (two) times daily. 09/02/22  Yes [provider]  furosemide (LASIX) 20 MG tablet Take 1 tablet (20 mg total) by mouth daily. 03/14/21  Yes Corky Crafts, MD  levothyroxine (SYNTHROID, LEVOTHROID) 100 MCG tablet Take 100 mcg by mouth daily before breakfast.   Yes [provider]  lisinopril (ZESTRIL) 40 MG tablet TAKE ONE TABLET BY MOUTH DAILY 04/17/22  Yes Corky Crafts, MD  metoprolol succinate (TOPROL-XL) 50 MG 24 hr tablet TAKE 1 & 1/2 TABLETS BY MOUTH DAILY take WITH OR immediately following a meal 04/17/22  Yes Corky Crafts, MD  metroNIDAZOLE (METROGEL) 0.75 % gel Apply 1 application topically daily. Patient places on her cheeks and nose, only about  once or twice a week depending on the climate.   Yes [provider]  nitroGLYCERIN (NITROSTAT) 0.4 MG SL tablet DISSOLVE ONE TABLET UNDER THE TONGUE AS NEEDED FOR CHEST PAIN MAY REPEAT IN FIVE MINUTES FOR TWO DOSES 04/17/22  Yes Corky Crafts, MD  pantoprazole (PROTONIX) 40 MG tablet Take 1 tablet (40 mg total) by mouth daily. Please call to schedule an overdue appointment with Dr. Eldridge Dace for refills, [phone], thank you. 1st attempt. 10/29/21  Yes Corky Crafts, MD  potassium chloride SA (KLOR-CON M) 20 MEQ tablet Take 1 tablet (20 mEq total) by mouth daily. 03/14/21  Yes Corky Crafts, MD  rivaroxaban (XARELTO) 20 MG TABS tablet Take 1 tablet (20 mg total) by mouth daily with supper. 12/24/19  Yes Marguerita Merles Latif, DO    Current Facility-Administered Medications  Medication Dose Route Frequency Provider Last Rate Last Admin   0.9 %  sodium chloride infusion (Manually program via Guardrails IV Fluids)   Intravenous Once Janalyn Shy, Subrina, MD   Held at 10/22/22 2348   0.9 %  sodium chloride infusion  250 mL Intravenous PRN Sundil, Subrina, MD       acetaminophen (TYLENOL) tablet 650 mg  650 mg Oral Q6H PRN Janalyn Shy, Subrina, MD       Or   acetaminophen (TYLENOL) suppository 650 mg  650 mg Rectal Q6H PRN Janalyn Shy, Subrina, MD       cyanocobalamin (VITAMIN B12) tablet 500 mcg  500 mcg Oral Daily Sundil, Subrina, MD   500 mcg at 10/22/22 2217   dextrose 5 % in lactated ringers infusion   Intravenous Continuous Marinda Elk, MD 10 mL/hr at 10/23/22 0811 Rate  Change at 10/23/22 0811   hydrALAZINE (APRESOLINE) injection 5 mg  5 mg Intravenous Q6H PRN Janalyn Shy, Subrina, MD       iron polysaccharides (NIFEREX) capsule 150 mg  150 mg Oral BID Sundil, Subrina, MD   150 mg at 10/22/22 2319   levothyroxine (SYNTHROID) tablet 100 mcg  100 mcg Oral QAC breakfast Janalyn Shy, Subrina, MD   100 mcg at 10/23/22 0503   lisinopril (ZESTRIL) tablet 40 mg  40 mg Oral Daily Sundil, Subrina, MD       metoprolol succinate (TOPROL-XL) 24 hr tablet 50 mg  50 mg Oral Daily Sundil, Subrina, MD       nitroGLYCERIN (NITROSTAT) SL tablet 0.4 mg  0.4 mg Sublingual Q5 min PRN Janalyn Shy, Subrina, MD       ondansetron [facility]) tablet 4 mg  4 mg Oral Q6H PRN Janalyn Shy, Subrina, MD       Or   ondansetron [facility]) injection 4 mg  4 mg Intravenous Q6H PRN Janalyn Shy, Subrina, MD       pantoprazole (PROTONIX) injection 40 mg  40 mg Intravenous Q12H Sundil, Subrina, MD       senna-docusate (Senokot-S) tablet 1 tablet  1 tablet Oral QHS Sundil, Subrina, MD   1 tablet at 10/22/22 2319   sodium chloride flush (NS) 0.9 % injection 3 mL  3 mL Intravenous Q12H Sundil, Subrina, MD   3 mL at 10/22/22 2227   sodium chloride flush (NS) 0.9 % injection 3 mL  3 mL Intravenous PRN Janalyn Shy Subrina, MD        Allergies as of 10/22/2022 - Review Complete 10/22/2022  Allergen Reaction Noted   Bee venom Itching and Swelling 05/20/2013   Contrast media [iodinated contrast media] Hives 08/16/2014   Fish allergy Anaphylaxis 12/22/2019   Iohexol Hives 12/27/2010  Review of Systems:    Constitutional: No weight loss, fever or chills Skin: No rash  Cardiovascular: No chest pain   Respiratory: +DOE Gastrointestinal: See HPI and otherwise negative Genitourinary: No dysuria Neurological: No headache, dizziness or syncope Musculoskeletal: No new muscle or joint pain Hematologic: No bleeding  Psychiatric: No history of depression or anxiety    Physical Exam:  Vital signs in last 24 hours: Temp:  [97.7 F (36.5  C)-98.9 F (37.2 C)] 98.3 F (36.8 C) (08/07 6770) Pulse Rate:  [53-76] 65 (08/07 0633) Resp:  [15-22] 20 (08/07 0600) BP: (137-177)/(52-75) 140/59 (08/07 0633) SpO2:  [91 %-100 %] 91 % (08/07 3403) Weight:  [88.4 kg-92.6 kg] 92.6 kg (08/07 0629) Last BM Date : 10/22/22 General:   Pleasant elderly Caucasian female appears to be in NAD, Well developed, Well nourished, alert and cooperative Head:  Normocephalic and atraumatic. Eyes:   PEERL, EOMI. No icterus. Conjunctiva pink. Ears:  Normal auditory acuity. Neck:  Supple Throat: Oral cavity and pharynx without inflammation, swelling or lesion.  Lungs: Respirations even and unlabored. Lungs clear to auscultation bilaterally.   No wheezes, crackles, or rhonchi.  Heart: Normal S1, S2. No MRG. Regular rate and rhythm. No peripheral edema, cyanosis or pallor.  Abdomen:  Soft, nondistended, nontender. No rebound or guarding. Normal bowel sounds. No appreciable masses or hepatomegaly. Rectal:  Not performed.  Msk:  Symmetrical without gross deformities. Peripheral pulses intact.  Extremities:  Without edema, no deformity or joint abnormality.  Neurologic:  Alert and  oriented x4;  grossly normal neurologically.   Skin:   Dry and intact without significant lesions or rashes. Psychiatric: Demonstrates good judgement and reason without abnormal affect or behaviors.  LAB RESULTS: Recent Labs    10/22/22 1732  WBC 4.9  HGB 5.7*  HCT 21.5*  PLT 229   BMET Recent Labs    10/22/22 1732  NA 137  K 3.5  CL 105  CO2 24  GLUCOSE 103*  BUN 16  CREATININE 0.61  CALCIUM 9.2   LFT Recent Labs    10/22/22 1732  PROT 6.8  ALBUMIN 3.6  AST 15  ALT 11  ALKPHOS 45  BILITOT 0.4   PT/INR Recent Labs    10/22/22 2000  LABPROT 15.7*  INR 1.2     Impression / Plan:   Impression: 1.  Acute on chronic iron deficiency anemia: History of iron deficiency anemia, presents to the hospital with a hemoglobin of 5.7--> 2 units PRBCs--> 8.7,  also received 1 dose of iron IV, no overt GI bleeding, iron panel with an iron low at 20, percent saturation low at 4, ferritin low at 4, last colonoscopy in November 2022 with 2 polyps and otherwise normal; consider upper GI source versus other 2.  Systolic heart failure preserved EF 60-65% 3.  A-flutter: on Xarelto, last dose 10/21/2022 AM 4.  History of CAD status post stent to left circumflex in 2015 5.  Hypothyroidism  Plan: 1.  Plan for EGD tomorrow given Endo time availability today.  Did discuss risks, benefits, limitations and alternatives with the patient and she agrees to proceed. 2.  Patient will be on regular diet today and will be n.p.o. at midnight. 3.  Continue Pantoprazole 40 mg IV twice daily 4.  Continue to monitor hemoglobin with transfusion as needed less than 7  Thank you for your kind consultation, we will continue to follow.  Violet Baldy [facility]  10/23/2022, 8:48 AM

## 2022-10-23 NOTE — Progress Notes (Signed)
[facility] PROGRESS NOTE    Progress Note  PHEONIX [name]  [name]:[id number] DOB: [birth year] DOA: 10/22/2022 PCP: Geoffry Paradise, MD     Brief Narrative:   [name] is an 82 y.o. female past medical history significant for CAD, history of STEMI in 2015, atrial flutter on Xarelto, essential hypertension, chronic diastolic heart failure with an EF of 60%,  colonoscopy 01/2021 showed transverse colon polyp and rectal polyp and EGD normal findings  comes into the ED for drop in hemoglobin over the last few months so her primary care doctor and hemoglobin was 5.5, last dose of Xarelto was on 10/21/2022.   Assessment/Plan:   Acute on chronic anemia/microcytic anemia/iron deficiency anemia, Anemia panel was drawn ferritin is 4 we will give IV iron. Hemoglobin in 2022 was 14. In the ED hemoglobin of 5.7 hold Xarelto last dose 10/21/2022 GI was consulted recommended tentatively EGD on Thursday, recommended n.p.o. after midnight Wednesday. FOBT is negative. She has been transfused 2 units of packed red blood cells will check hematocrit posttransfusion.  Essential hypertension/chronic diastolic heart failure: Amlodipine DC'd as an outpatient due to lower extremity edema. Continue lisinopril and metoprolol. Blood pressures relatively controlled preferably overnight, continue titrate antihypertensive medications as an outpatient.  History of typical flutter on Xarelto: Continue Toprol continue to monitor on telemetry.  History of CAD in 2015: Holding Xarelto continue statins.  Hypothyroidism: Continue Synthroid. Abnormal TSH will need to repeated in 4 to 6 weeks once the patient is stable as an outpatient.  DVT prophylaxis: scd's Family Communication:none Status is: Inpatient Remains inpatient appropriate because: Acute lower GI bleed    Code Status:     Code Status Orders  (From admission, onward)           Start     Ordered   10/22/22 1935  Full code   Continuous       Question:  By:  Answer:  Consent: discussion documented in EHR   10/22/22 1937           Code Status History     Date Active Date Inactive Code Status Order ID Comments User Context   12/22/2019 1138 12/23/2019 2359 Full Code [id number]  Leatha Gilding, MD ED   08/16/2014 1530 08/18/2014 1423 Full Code [id number]  Antionette Char, MD Inpatient   06/26/2014 0125 06/30/2014 1619 Full Code [id number]  Hillary Bow, DO ED   03/24/2013 2011 03/28/2013 1454 Full Code [id number]  Ok Anis, NP Inpatient      Advance Directive Documentation    Flowsheet Row Most Recent Value  Type of Advance Directive Healthcare Power of Attorney  Pre-existing out of facility DNR order (yellow form or pink MOST form) --  "MOST" Form in Place? --         IV Access:   Peripheral IV   Procedures and diagnostic studies:   No results found.   Medical Consultants:   None.   Subjective:    [name] no bright red blood per rectum  Objective:    Vitals:   10/23/22 0455 10/23/22 0600 10/23/22 0629 10/23/22 0633  BP: 137/67   (!) 140/59  Pulse: (!) 53   65  Resp: 18 20    Temp: 97.7 F (36.5 C)   98.3 F (36.8 C)  TempSrc: Axillary   Oral  SpO2: 100%   91%  Weight:   92.6 kg   Height:       SpO2: 91 %   Intake/Output  Summary (Last 24 hours) at 10/23/2022 0742 Last data filed at 10/23/2022 0504 Gross per 24 hour  Intake 1016.22 ml  Output --  Net 1016.22 ml   Filed Weights   10/23/22 0130 10/23/22 0629  Weight: 88.4 kg 92.6 kg    Exam: General exam: In no acute distress. Respiratory system: Good air movement and clear to auscultation. Cardiovascular system: S1 & S2 heard, RRR. No JVD. Gastrointestinal system: Abdomen is nondistended, soft and nontender.  Extremities: No pedal edema. Skin: No rashes, lesions or ulcers Psychiatry: Judgement and insight appear normal. Mood & affect appropriate.    Data Reviewed:    Labs: Basic  Metabolic Panel: Recent Labs  Lab 10/22/22 1732  NA 137  K 3.5  CL 105  CO2 24  GLUCOSE 103*  BUN 16  CREATININE 0.61  CALCIUM 9.2   GFR Estimated Creatinine Clearance: 64.3 mL/min (by C-G formula based on SCr of 0.61 mg/dL). Liver Function Tests: Recent Labs  Lab 10/22/22 1732  AST 15  ALT 11  ALKPHOS 45  BILITOT 0.4  PROT 6.8  ALBUMIN 3.6   No results for input(s): "LIPASE", "AMYLASE" in the last 168 hours. No results for input(s): "AMMONIA" in the last 168 hours. Coagulation profile Recent Labs  Lab 10/22/22 2000  INR 1.2   COVID-19 Labs  Recent Labs    10/22/22 1942  FERRITIN 4*    Lab Results  Component Value Date   SARSCOV2NAA NEGATIVE 12/22/2019    CBC: Recent Labs  Lab 10/22/22 1732  WBC 4.9  NEUTROABS 3.5  HGB 5.7*  HCT 21.5*  MCV 78.5*  PLT 229   Cardiac Enzymes: No results for input(s): "CKTOTAL", "CKMB", "CKMBINDEX", "TROPONINI" in the last 168 hours. BNP (last 3 results) No results for input(s): "PROBNP" in the last 8760 hours. CBG: No results for input(s): "GLUCAP" in the last 168 hours. D-Dimer: No results for input(s): "DDIMER" in the last 72 hours. Hgb A1c: No results for input(s): "HGBA1C" in the last 72 hours. Lipid Profile: No results for input(s): "CHOL", "HDL", "LDLCALC", "TRIG", "CHOLHDL", "LDLDIRECT" in the last 72 hours. Thyroid function studies: Recent Labs    10/22/22 1942  TSH 6.867*   Anemia work up: Recent Labs    10/22/22 1942  VITAMINB12 720  FOLATE 7.4  FERRITIN 4*  TIBC 500*  IRON 20*  RETICCTPCT 3.0   Sepsis Labs: Recent Labs  Lab 10/22/22 1732  WBC 4.9   Microbiology No results found for this or any previous visit (from the past 240 hour(s)).   Medications:    sodium chloride   Intravenous Once   cyanocobalamin  500 mcg Oral Daily   iron polysaccharides  150 mg Oral BID   levothyroxine  100 mcg Oral QAC breakfast   lisinopril  40 mg Oral Daily   metoprolol succinate  50 mg Oral  Daily   pantoprazole (PROTONIX) IV  40 mg Intravenous Q12H   senna-docusate  1 tablet Oral QHS   sodium chloride flush  3 mL Intravenous Q12H   Continuous Infusions:  sodium chloride     dextrose 5% lactated ringers 75 mL/hr at 10/23/22 0500      LOS: 1 day   Marinda Elk  [facility]  10/23/2022, 7:42 AM

## 2022-10-23 NOTE — TOC CM/SW Note (Signed)
Transition of Care [facility]) - Inpatient Brief Assessment   Patient Details  Name: [name] MRN: [id number] Date of Birth: 1940/09/11  Transition of Care [facility]) CM/SW Contact:    Howell Rucks, RN Phone Number: 10/23/2022, 3:47 PM   Clinical Narrative: Met with pt at bedside to introduce role of TOC/NCM and review for dc planning. Pt confirmed she has a PCP and pharmacy in place, no current home care services, reports she uses a quad cane for functional mobility, confirmed she has transportation available at discharge. TOC Brief Assessment completed. No TOC needs identified.     Transition of Care Asessment: Insurance and Status: Insurance coverage has been reviewed Patient has primary care physician: Yes Home environment has been reviewed: private residence with support from family and friends Prior level of function:: Independent with quad cane Prior/Current Home Services: No current home services Social Determinants of Health Reivew: SDOH reviewed no interventions necessary Readmission risk has been reviewed: Yes Transition of care needs: no transition of care needs at this time

## 2022-10-23 NOTE — Progress Notes (Signed)
Mobility Specialist - Progress Note   10/23/22 1447  Mobility  Activity Ambulated with assistance in hallway  Level of Assistance Standby assist, set-up cues, supervision of patient - no hands on  Assistive Device Four point cane (Hallway Rails)  Distance Ambulated (ft) 160 ft  Range of Motion/Exercises Active  Activity Response Tolerated well  Mobility Referral Yes  $Mobility charge 1 Mobility  Mobility Specialist Start Time (ACUTE ONLY) 1435  Mobility Specialist Stop Time (ACUTE ONLY) 1447  Mobility Specialist Time Calculation (min) (ACUTE ONLY) 12 min   Pt was found in bed and agreeable to ambulate. Had a couple of brief standing rest breaks to sip water due to her mouth being dry. At EOS returned to bed with all needs met. Call bell in reach.  Billey Chang Mobility Specialist

## 2022-10-24 ENCOUNTER — Encounter (HOSPITAL_COMMUNITY)
Admission: EM | Disposition: A | Discharge status: Home / Self Care | Payer: Self-pay | Source: Home / Self Care | Attending: Internal Medicine

## 2022-10-24 ENCOUNTER — Inpatient Hospital Stay (HOSPITAL_COMMUNITY): Payer: Medicare Other | Admitting: Anesthesiology

## 2022-10-24 DIAGNOSIS — D509 Iron deficiency anemia, unspecified: Secondary | ICD-10-CM | POA: Diagnosis not present

## 2022-10-24 DIAGNOSIS — K449 Diaphragmatic hernia without obstruction or gangrene: Secondary | ICD-10-CM | POA: Diagnosis not present

## 2022-10-24 DIAGNOSIS — I110 Hypertensive heart disease with heart failure: Secondary | ICD-10-CM

## 2022-10-24 DIAGNOSIS — K2289 Other specified disease of esophagus: Secondary | ICD-10-CM

## 2022-10-24 DIAGNOSIS — K31819 Angiodysplasia of stomach and duodenum without bleeding: Secondary | ICD-10-CM

## 2022-10-24 DIAGNOSIS — K3189 Other diseases of stomach and duodenum: Secondary | ICD-10-CM

## 2022-10-24 DIAGNOSIS — I5032 Chronic diastolic (congestive) heart failure: Secondary | ICD-10-CM | POA: Diagnosis not present

## 2022-10-24 DIAGNOSIS — I10 Essential (primary) hypertension: Secondary | ICD-10-CM | POA: Diagnosis not present

## 2022-10-24 DIAGNOSIS — D649 Anemia, unspecified: Secondary | ICD-10-CM | POA: Diagnosis not present

## 2022-10-24 DIAGNOSIS — I2510 Atherosclerotic heart disease of native coronary artery without angina pectoris: Secondary | ICD-10-CM

## 2022-10-24 DIAGNOSIS — K317 Polyp of stomach and duodenum: Secondary | ICD-10-CM

## 2022-10-24 HISTORY — PX: GASTRIC VARICES BANDING: SHX5519

## 2022-10-24 HISTORY — PX: HOT HEMOSTASIS: SHX5433

## 2022-10-24 HISTORY — PX: ESOPHAGOGASTRODUODENOSCOPY (EGD) WITH PROPOFOL: SHX5813

## 2022-10-24 HISTORY — PX: BIOPSY: SHX5522

## 2022-10-24 SURGERY — ESOPHAGOGASTRODUODENOSCOPY (EGD) WITH PROPOFOL
Anesthesia: Monitor Anesthesia Care

## 2022-10-24 MED ORDER — PANTOPRAZOLE SODIUM 40 MG PO TBEC
40.0000 mg | DELAYED_RELEASE_TABLET | Freq: Two times a day (BID) | ORAL | Status: DC
  Administered 2022-10-24 – 2022-10-26 (×4): 40 mg via ORAL
  Filled 2022-10-24 (×4): qty 1

## 2022-10-24 MED ORDER — LACTATED RINGERS IV SOLN
INTRAVENOUS | Status: AC | PRN
  Administered 2022-10-24: 1000 mL via INTRAVENOUS

## 2022-10-24 MED ORDER — SUCRALFATE 1 G PO TABS
1.0000 g | ORAL_TABLET | Freq: Two times a day (BID) | ORAL | Status: DC
  Administered 2022-10-24 – 2022-10-26 (×4): 1 g via ORAL
  Filled 2022-10-24 (×3): qty 1

## 2022-10-24 MED ORDER — PROPOFOL 500 MG/50ML IV EMUL
INTRAVENOUS | Status: DC | PRN
  Administered 2022-10-24: 80 ug/kg/min via INTRAVENOUS
  Administered 2022-10-24: 20 mg via INTRAVENOUS
  Administered 2022-10-24: 30 mg via INTRAVENOUS
  Administered 2022-10-24: 20 mg via INTRAVENOUS

## 2022-10-24 MED ORDER — SODIUM CHLORIDE 0.9 % IV SOLN: INTRAVENOUS | Status: DC

## 2022-10-24 MED ORDER — PROPOFOL 500 MG/50ML IV EMUL
INTRAVENOUS | Status: AC
  Filled 2022-10-24: qty 50

## 2022-10-24 MED ORDER — LIDOCAINE HCL (CARDIAC) PF 100 MG/5ML IV SOSY
PREFILLED_SYRINGE | INTRAVENOUS | Status: DC | PRN
  Administered 2022-10-24: 60 mg via INTRAVENOUS

## 2022-10-24 SURGICAL SUPPLY — 15 items

## 2022-10-24 NOTE — Progress Notes (Signed)
[facility] PROGRESS NOTE    Progress Note  [name]  GNF:[id number] DOB: [birth year] DOA: 10/22/2022 PCP: Geoffry Paradise, MD     Brief Narrative:   [name] is an 82 y.o. female past medical history significant for CAD, history of STEMI in 2015, atrial flutter on Xarelto, essential hypertension, chronic diastolic heart failure with an EF of 60%,  colonoscopy 01/2021 showed transverse colon polyp and rectal polyp and EGD normal findings  comes into the ED for drop in hemoglobin over the last few months so her primary care doctor and hemoglobin was 5.5, last dose of Xarelto was on 10/21/2022.   Assessment/Plan:   Acute on chronic anemia/microcytic anemia/iron deficiency anemia, Status post IV iron. GI was consulted recommended tentatively EGD today Status post 2 units of packed red blood cells her hemoglobin this morning is 8. FOBT negative but her ferritin was 4 and a low MCV (pointing to iron deficiency anemia) which points toward chronic blood loss.  Essential hypertension/chronic diastolic heart failure: Amlodipine DC'd as an outpatient due to lower extremity edema. Continue lisinopril and metoprolol. Blood pressures relatively controlled preferably overnight, continue titrate antihypertensive medications as an outpatient.  History of typical flutter on Xarelto: Continue Toprol continue to monitor on telemetry. Hold Xarelto.  History of CAD in 2015: Holding Xarelto continue statins.  Hypothyroidism: Continue Synthroid. Abnormal TSH will need to repeated in 4 to 6 weeks once the patient is stable as an outpatient.  DVT prophylaxis: scd's Family Communication:none Status is: Inpatient Remains inpatient appropriate because: Acute lower GI bleed    Code Status:     Code Status Orders  (From admission, onward)           Start     Ordered   10/22/22 1935  Full code  Continuous       Question:  By:  Answer:  Consent: discussion documented in  EHR   10/22/22 1937           Code Status History     Date Active Date Inactive Code Status Order ID Comments User Context   12/22/2019 1138 12/23/2019 2359 Full Code [id number]  Leatha Gilding, MD ED   08/16/2014 1530 08/18/2014 1423 Full Code [id number]  Antionette Char, MD Inpatient   06/26/2014 0125 06/30/2014 1619 Full Code [id number]  Hillary Bow, DO ED   03/24/2013 2011 03/28/2013 1454 Full Code [id number]  Ok Anis, NP Inpatient      Advance Directive Documentation    Flowsheet Row Most Recent Value  Type of Advance Directive Healthcare Power of Attorney  Pre-existing out of facility DNR order (yellow form or pink MOST form) --  "MOST" Form in Place? --         IV Access:   Peripheral IV   Procedures and diagnostic studies:   No results found.   Medical Consultants:   None.   Subjective:    [name] no bright red blood per rectum.  Objective:    Vitals:   10/23/22 1428 10/23/22 2135 10/24/22 0500 10/24/22 0536  BP: (!) 150/62 (!) 162/68  (!) 139/59  Pulse: 66 75  66  Resp: 18   18  Temp: 99.8 F (37.7 C) 98.3 F (36.8 C)  98.2 F (36.8 C)  TempSrc: Oral Oral  Oral  SpO2: 96% 97%  99%  Weight:   87.8 kg   Height:       SpO2: 99 %   Intake/Output Summary (Last 24 hours) at  10/24/2022 0837 Last data filed at 10/24/2022 0400 Gross per 24 hour  Intake 929.09 ml  Output --  Net 929.09 ml   Filed Weights   10/23/22 0130 10/23/22 0629 10/24/22 0500  Weight: 88.4 kg 92.6 kg 87.8 kg    Exam: General exam: In no acute distress. Respiratory system: Good air movement and clear to auscultation. Cardiovascular system: S1 & S2 heard, RRR. No JVD. Gastrointestinal system: Abdomen is nondistended, soft and nontender.  Extremities: No pedal edema. Skin: No rashes, lesions or ulcers Psychiatry: Judgement and insight appear normal. Mood & affect appropriate.  Data Reviewed:    Labs: Basic Metabolic Panel: Recent Labs   Lab 10/22/22 1732  NA 137  K 3.5  CL 105  CO2 24  GLUCOSE 103*  BUN 16  CREATININE 0.61  CALCIUM 9.2   GFR Estimated Creatinine Clearance: 62.6 mL/min (by C-G formula based on SCr of 0.61 mg/dL). Liver Function Tests: Recent Labs  Lab 10/22/22 1732  AST 15  ALT 11  ALKPHOS 45  BILITOT 0.4  PROT 6.8  ALBUMIN 3.6   No results for input(s): "LIPASE", "AMYLASE" in the last 168 hours. No results for input(s): "AMMONIA" in the last 168 hours. Coagulation profile Recent Labs  Lab 10/22/22 2000  INR 1.2   COVID-19 Labs  Recent Labs    10/22/22 1942  FERRITIN 4*    Lab Results  Component Value Date   SARSCOV2NAA NEGATIVE 12/22/2019    CBC: Recent Labs  Lab 10/22/22 1732 10/23/22 0929 10/23/22 1530 10/24/22 0131  WBC 4.9  --   --   --   NEUTROABS 3.5  --   --   --   HGB 5.7* 8.7* 7.9* 8.0*  HCT 21.5* 29.7* 26.9* 27.4*  MCV 78.5*  --   --   --   PLT 229  --   --   --    Cardiac Enzymes: No results for input(s): "CKTOTAL", "CKMB", "CKMBINDEX", "TROPONINI" in the last 168 hours. BNP (last 3 results) No results for input(s): "PROBNP" in the last 8760 hours. CBG: No results for input(s): "GLUCAP" in the last 168 hours. D-Dimer: No results for input(s): "DDIMER" in the last 72 hours. Hgb A1c: No results for input(s): "HGBA1C" in the last 72 hours. Lipid Profile: No results for input(s): "CHOL", "HDL", "LDLCALC", "TRIG", "CHOLHDL", "LDLDIRECT" in the last 72 hours. Thyroid function studies: Recent Labs    10/22/22 1942  TSH 6.867*   Anemia work up: Recent Labs    10/22/22 1942  VITAMINB12 720  FOLATE 7.4  FERRITIN 4*  TIBC 500*  IRON 20*  RETICCTPCT 3.0   Sepsis Labs: Recent Labs  Lab 10/22/22 1732  WBC 4.9   Microbiology No results found for this or any previous visit (from the past 240 hour(s)).   Medications:    sodium chloride   Intravenous Once   bisacodyl  10 mg Oral Once   cyanocobalamin  500 mcg Oral Daily   iron  polysaccharides  150 mg Oral BID   levothyroxine  100 mcg Oral QAC breakfast   lisinopril  40 mg Oral Daily   metoprolol succinate  50 mg Oral Daily   pantoprazole (PROTONIX) IV  40 mg Intravenous Q12H   polyethylene glycol  17 g Oral Daily   senna-docusate  1 tablet Oral QHS   sodium chloride flush  3 mL Intravenous Q12H   Continuous Infusions:  sodium chloride     dextrose 5% lactated ringers 10 mL/hr at 10/23/22 [phone]  LOS: 2 days   Marinda Elk  [facility]  10/24/2022, 8:37 AM

## 2022-10-24 NOTE — Anesthesia Preprocedure Evaluation (Signed)
Anesthesia Evaluation    Reviewed: Allergy & Precautions, Patient's Chart, lab work & pertinent test results  History of Anesthesia Complications Negative for: history of anesthetic complications  Airway Mallampati: II  TM Distance: >3 FB Neck ROM: Full    Dental  (+) Missing,    Pulmonary former smoker   Pulmonary exam normal        Cardiovascular hypertension, Pt. on medications + CAD, + Past MI (2015), + Cardiac Stents and +CHF  Normal cardiovascular exam     Neuro/Psych TIA   GI/Hepatic Neg liver ROS,GERD  Controlled,,GIB   Endo/Other  Hypothyroidism    Renal/GU negative Renal ROS     Musculoskeletal  (+) Arthritis ,    Abdominal   Peds  Hematology  (+) Blood dyscrasia (Hgb 8.0), anemia   Anesthesia Other Findings Day of surgery medications reviewed with patient.  Reproductive/Obstetrics                             Anesthesia Physical Anesthesia Plan  ASA: 3  Anesthesia Plan: MAC   Post-op Pain Management: Minimal or no pain anticipated   Induction:   PONV Risk Score and Plan: 2 and Treatment may vary due to age or medical condition and Propofol infusion  Airway Management Planned: Natural Airway and Nasal Cannula  Additional Equipment: None  Intra-op Plan:   Post-operative Plan:   Informed Consent: I have reviewed the patients History and Physical, chart, labs and discussed the procedure including the risks, benefits and alternatives for the proposed anesthesia with the patient or authorized representative who has indicated his/her understanding and acceptance.       Plan Discussed with: CRNA  Anesthesia Plan Comments:        Anesthesia Quick Evaluation

## 2022-10-24 NOTE — Anesthesia Procedure Notes (Signed)
Procedure Name: MAC Date/Time: 10/24/2022 3:23 PM  Performed by: Ambrose Finland, CRNAPre-anesthesia Checklist: Patient identified, Emergency Drugs available, Suction available, Patient being monitored and Timeout performed Patient Re-evaluated:Patient Re-evaluated prior to induction Oxygen Delivery Method: Simple face mask

## 2022-10-24 NOTE — Anesthesia Postprocedure Evaluation (Signed)
Anesthesia Post Note  Patient: SHAQUNNA LINGAD  Procedure(s) Performed: ESOPHAGOGASTRODUODENOSCOPY (EGD) WITH PROPOFOL BIOPSY HOT HEMOSTASIS (ARGON PLASMA COAGULATION/BICAP) GASTRIC VARICES BANDING     Patient location during evaluation: PACU Anesthesia Type: MAC Level of consciousness: awake and alert Pain management: pain level controlled Vital Signs Assessment: post-procedure vital signs reviewed and stable Respiratory status: spontaneous breathing, nonlabored ventilation and respiratory function stable Cardiovascular status: blood pressure returned to baseline Postop Assessment: no apparent nausea or vomiting Anesthetic complications: no   No notable events documented.  Last Vitals:  Vitals:   10/24/22 1550 10/24/22 1600  BP: (!) 153/35 (!) 160/40  Pulse: 64 64  Resp: 19 (!) 21  Temp:    SpO2: 95% 96%    Last Pain:  Vitals:   10/24/22 1600  TempSrc:   PainSc: 0-No pain                 Shanda Howells

## 2022-10-24 NOTE — Plan of Care (Signed)
  Problem: Education: Goal: Knowledge of General Education information will improve Description: Including pain rating scale, medication(s)/side effects and non-pharmacologic comfort measures Outcome: Progressing   Problem: Coping: Goal: Level of anxiety will decrease Outcome: Progressing   Problem: Safety: Goal: Ability to remain free from injury will improve Outcome: Progressing   

## 2022-10-24 NOTE — Transfer of Care (Addendum)
Immediate Anesthesia Transfer of Care Note  Patient: [name]  Procedure(s) Performed: ESOPHAGOGASTRODUODENOSCOPY (EGD) WITH PROPOFOL BIOPSY HOT HEMOSTASIS (ARGON PLASMA COAGULATION/BICAP) GASTRIC VARICES BANDING  Patient Location: PACU and Endoscopy Unit  Anesthesia Type:MAC  Level of Consciousness: awake and alert   Airway & Oxygen Therapy: Patient Spontanous Breathing  Post-op Assessment: Report given to RN  Post vital signs: stable  Last Vitals:  Vitals Value Taken Time  BP    Temp    Pulse 65 10/24/22 1544  Resp 24 10/24/22 1544  SpO2 96 % 10/24/22 1544  Vitals shown include unfiled device data.  Last Pain:  Vitals:   10/24/22 1415  TempSrc: Tympanic  PainSc: 0-No pain      Patients Stated Pain Goal: 0 (10/24/22 1144)  Complications: No notable events documented.

## 2022-10-24 NOTE — Plan of Care (Signed)
  Problem: Clinical Measurements: Goal: Ability to maintain clinical measurements within normal limits will improve Outcome: Progressing Goal: Diagnostic test results will improve Outcome: Progressing   Problem: Nutrition: Goal: Adequate nutrition will be maintained Outcome: Progressing   Problem: Coping: Goal: Level of anxiety will decrease Outcome: Progressing   Problem: Education: Goal: Knowledge of General Education information will improve Description: Including pain rating scale, medication(s)/side effects and non-pharmacologic comfort measures Outcome: Adequate for Discharge   Problem: Health Behavior/Discharge Planning: Goal: Ability to manage health-related needs will improve Outcome: Adequate for Discharge   Problem: Clinical Measurements: Goal: Will remain free from infection Outcome: Adequate for Discharge Goal: Respiratory complications will improve Outcome: Adequate for Discharge Goal: Cardiovascular complication will be avoided Outcome: Adequate for Discharge   Problem: Activity: Goal: Risk for activity intolerance will decrease Outcome: Adequate for Discharge   Problem: Elimination: Goal: Will not experience complications related to bowel motility Outcome: Adequate for Discharge Goal: Will not experience complications related to urinary retention Outcome: Adequate for Discharge   Problem: Pain Managment: Goal: General experience of comfort will improve Outcome: Adequate for Discharge   Problem: Safety: Goal: Ability to remain free from injury will improve Outcome: Adequate for Discharge   Problem: Skin Integrity: Goal: Risk for impaired skin integrity will decrease Outcome: Adequate for Discharge

## 2022-10-24 NOTE — Op Note (Signed)
[facility] Patient Name: [name] Procedure Date: 10/24/2022 MRN: [id number] Attending MD: Corliss Parish , MD, [id number] Date of Birth: [birth year] CSN: [id number] Age: 82 Admit Type: Inpatient Procedure:                Upper GI endoscopy Indications:              Iron deficiency anemia, Occult blood in stool as                            outpatient but in the hospital was heme-negative Providers:                Corliss Parish, MD, Fransisca Connors, Leanne Lovely, Technician Referring MD:             Inpatient medical service, Wilhemina Bonito. Marina Goodell, MD Medicines:                Monitored Anesthesia Care Complications:            No immediate complications. Estimated Blood Loss:     Estimated blood loss was minimal. Procedure:                Pre-Anesthesia Assessment:                           - Prior to the procedure, a History and Physical                            was performed, and patient medications and                            allergies were reviewed. The patient's tolerance of                            previous anesthesia was also reviewed. The risks                            and benefits of the procedure and the sedation                            options and risks were discussed with the patient.                            All questions were answered, and informed consent                            was obtained. Prior Anticoagulants: The patient has                            taken Xarelto (rivaroxaban), last dose was 3 days                            prior to procedure. ASA Grade Assessment: III - A  patient with severe systemic disease. After                            reviewing the risks and benefits, the patient was                            deemed in satisfactory condition to undergo the                            procedure.                           After obtaining informed consent,  the endoscope was                            passed under direct vision. Throughout the                            procedure, the patient's blood pressure, pulse, and                            oxygen saturations were monitored continuously. The                            [id number]-H190 ([id number]) Olympus endoscope was introduced                            through the mouth, and advanced to the second part                            of duodenum. The upper GI endoscopy was                            accomplished without difficulty. The patient                            tolerated the procedure. Scope In: Scope Out: Findings:      No gross lesions were noted in the entire esophagus.      The Z-line was irregular and was found 34 cm from the incisors.      A 7 cm hiatal hernia was present. No evidence of Cameron's lesions or       erosions were found.      A single 12 mm semi-sessile polyp with no bleeding and stigmata of       recent bleeding, at the base of the polyp, was found in the cardia       region. One ligature was successfully placed. There was no bleeding       during and at the end of the procedure.      Three diminutive angiodysplastic lesions with no bleeding were found in       the gastric body. Fulguration to ablate the lesion to prevent bleeding       by argon plasma (gastric settings) was successful.      Patchy mildly erythematous mucosa without bleeding was found in the       entire examined stomach.  Biopsies were taken with a cold forceps for       histology and Helicobacter pylori testing.      No gross lesions were noted in the duodenal bulb, in the first portion       of the duodenum and in the second portion of the duodenum. Biopsies for       histology were taken with a cold forceps for evaluation of celiac       disease. Impression:               - No gross lesions in the entire esophagus. Z-line                            irregular, 34 cm from the incisors.                            - 7 cm hiatal hernia. No evidence of Cameron's                            erosions/lesions.                           - A single gastric polyp with what appeared to be                            recent oozing. Ligated.                           - Three non-bleeding angiodysplastic lesions in the                            stomach. Treated with argon plasma coagulation                            (APC).                           - Erythematous mucosa in the stomach. Biopsied.                           - No gross lesions in the duodenal bulb, in the                            first portion of the duodenum and in the second                            portion of the duodenum. Biopsied. Moderate Sedation:      Not Applicable - Patient had care per Anesthesia. Recommendation:           - The patient will be observed post-procedure,                            until all discharge criteria are met.                           - Return patient to hospital ward for ongoing care.                           -  Advance diet as tolerated.                           - Based on the findings today of the                            AVMs/angiodysplasias as well as the polyp that had                            evidence of recent stigmata of bleeding, I do not                            feel that further inpatient workup from an iron                            deficiency perspective is required at this time.                            Should the patient have persisting issues of iron                            deficiency, then strongly recommend video capsule                            endoscopy. We held off on considering repeat                            colonoscopy since it was done within the last year                            and a half but that could be considered as well                            (though she is currently heme-negative).                           - Administer IV iron while  in-house and please set                            up outpatient IV iron infusions and follow-up.                           - Oral iron once daily to be started in 1 week.                           - May restart anticoagulation on 8/11 to decrease                            interventional bleeding risk.                           - Change IV PPI to oral PPI 40 mg twice daily for 1  month then once daily thereafter.                           - Carafate 1 g twice daily for 2 weeks.                           - Continue present medications otherwise.                           - The findings and recommendations were discussed                            with the patient.                           - The findings and recommendations were discussed                            with the referring physician. Procedure Code(s):        --- Professional ---                           [phone], 59, Esophagogastroduodenoscopy, flexible,                            transoral; with control of bleeding, any method                           43239, Esophagogastroduodenoscopy, flexible,                            transoral; with biopsy, single or multiple Diagnosis Code(s):        --- Professional ---                           K22.89, Other specified disease of esophagus                           K44.9, Diaphragmatic hernia without obstruction or                            gangrene                           K31.7, Polyp of stomach and duodenum                           K31.819, Angiodysplasia of stomach and duodenum                            without bleeding                           K31.89, Other diseases of stomach and duodenum                           D50.9, Iron deficiency anemia, unspecified  R19.5, Other fecal abnormalities CPT copyright 2022 [employer]. All rights reserved. The codes documented in this report are preliminary and upon coder  review may  be revised to meet current compliance requirements. Corliss Parish, MD 10/24/2022 3:45:28 PM Number of Addenda: 0

## 2022-10-25 DIAGNOSIS — K317 Polyp of stomach and duodenum: Secondary | ICD-10-CM

## 2022-10-25 DIAGNOSIS — Z7901 Long term (current) use of anticoagulants: Secondary | ICD-10-CM

## 2022-10-25 DIAGNOSIS — E039 Hypothyroidism, unspecified: Secondary | ICD-10-CM | POA: Diagnosis not present

## 2022-10-25 DIAGNOSIS — K31819 Angiodysplasia of stomach and duodenum without bleeding: Secondary | ICD-10-CM

## 2022-10-25 DIAGNOSIS — D649 Anemia, unspecified: Secondary | ICD-10-CM

## 2022-10-25 DIAGNOSIS — I4892 Unspecified atrial flutter: Secondary | ICD-10-CM | POA: Diagnosis not present

## 2022-10-25 DIAGNOSIS — I5032 Chronic diastolic (congestive) heart failure: Secondary | ICD-10-CM | POA: Diagnosis not present

## 2022-10-25 DIAGNOSIS — D509 Iron deficiency anemia, unspecified: Secondary | ICD-10-CM | POA: Diagnosis not present

## 2022-10-25 DIAGNOSIS — I10 Essential (primary) hypertension: Secondary | ICD-10-CM | POA: Diagnosis not present

## 2022-10-25 MED ORDER — SODIUM CHLORIDE 0.9 % IV SOLN
510.0000 mg | Freq: Once | INTRAVENOUS | Status: AC
  Administered 2022-10-25: 510 mg via INTRAVENOUS
  Filled 2022-10-25: qty 510

## 2022-10-25 NOTE — Care Management Important Message (Signed)
Important Message  Patient Details IM Letter given Name: [name] MRN: [id number] Date of Birth: April 23, 1940   Medicare Important Message Given:  Yes     Caren Macadam 10/25/2022, 2:35 PM

## 2022-10-25 NOTE — Progress Notes (Signed)
Progress Note  Primary GI: Dr. Marina Goodell DOA: 10/22/2022         Hospital Day: 4   Subjective  Chief Complaint:  Anemia Hemoccult positive    No family was present at the time of my evaluation. Patient states she is feeling well today, she was able to walk down the hallway and denies any dizziness, dyspnea. Denies any nausea, vomiting.  Still has not had a bowel movement but states she did have a large bowel movement prior to admission has not had much food. Hemoglobin drifted slightly 8.1-7.8 status post EGD yesterday    Objective   Vital signs in last 24 hours: Temp:  [97.1 F (36.2 C)-99.2 F (37.3 C)] 98.1 F (36.7 C) (08/09 0441) Pulse Rate:  [64-73] 69 (08/09 0441) Resp:  [16-24] 18 (08/09 0441) BP: (124-198)/(35-80) 140/51 (08/09 0441) SpO2:  [93 %-98 %] 95 % (08/09 0441) Weight:  [87.2 kg] 87.2 kg (08/09 0452) Last BM Date : 10/22/22 Last BM recorded by nurses in past 5 days No data recorded  General:   female in no acute distress  Heart:  Regular rate and rhythm; no murmurs Pulm: Clear anteriorly; no wheezing Abdomen:  Soft, Obese AB, Sluggish bowel sounds. No tenderness .  Extremities:  without edema. Neurologic:  Alert and  oriented x4;  No focal deficits.  Psych:  Cooperative. Normal mood and affect.  Intake/Output from previous day: 08/08 0701 - 08/09 0700 In: 320 [P.O.:320] Out: -  Intake/Output this shift: No intake/output data recorded.  Studies/Results: No results found.  Lab Results: Recent Labs    10/22/22 1732 10/23/22 0929 10/24/22 1013 10/24/22 1629 10/25/22 0506  WBC 4.9  --   --   --  5.4  HGB 5.7*   < > 8.0* 8.1* 7.8*  HCT 21.5*   < > 27.9* 28.4* 27.7*  PLT 229  --   --   --  193   < > = values in this interval not displayed.   BMET Recent Labs    10/22/22 1732 10/25/22 0506  NA 137 134*  K 3.5 3.5  CL 105 101  CO2 24 21*  GLUCOSE 103* 95  BUN 16 11  CREATININE 0.61 0.72  CALCIUM 9.2 8.5*   LFT Recent Labs     10/22/22 1732  PROT 6.8  ALBUMIN 3.6  AST 15  ALT 11  ALKPHOS 45  BILITOT 0.4   PT/INR Recent Labs    10/22/22 2000  LABPROT 15.7*  INR 1.2     Scheduled Meds:  sodium chloride   Intravenous Once   bisacodyl  10 mg Oral Once   cyanocobalamin  500 mcg Oral Daily   iron polysaccharides  150 mg Oral BID   levothyroxine  100 mcg Oral QAC breakfast   lisinopril  40 mg Oral Daily   metoprolol succinate  50 mg Oral Daily   pantoprazole  40 mg Oral BID   polyethylene glycol  17 g Oral Daily   senna-docusate  1 tablet Oral QHS   sodium chloride flush  3 mL Intravenous Q12H   sucralfate  1 g Oral BID   Continuous Infusions:  sodium chloride        Impression/Plan:   Acute on chronic iron deficiency anemia hemoglobin of 5.7--> 2 units PRBCs--> 8.7-->8.1-->7.8, 10/22/2022 Iron 20 Ferritin 4 s/p 1 dose IV iron 08/06 last colonoscopy in November 2022 with 2 polyps and otherwise normal Esophagus 08/08 EGD with Dr. Meridee Score showed 7 cm hiatal  hernia, 3 AVMs in the gastric body status post APC, 12 mm oozing gastric polyp status post ligation, gastritis. Most likely source of anemia is from AVM/polyp, recent colonoscopy 01/2021, if she has persistent iron deficiency post discharge we will plan on capsule endoscopy Transition to PPI 40 mg twice daily for 1 month then once daily Carafate 1 g twice daily for 2 weeks Patient's had 1 IV iron 8/6, will repeat 1 dose today Will schedule follow-up in our office Will need 2 to 4-week repeat CBC and closer monitoring thereafter  HFpEF  EF 60-65% A-flutter on Xarelto, last dose 10/21/2022 AM  History of CAD  status post stent to left circumflex in 2015  Hypothyroidism    Principal Problem:   Acute on chronic anemia Active Problems:   Essential hypertension   Hypothyroidism   Hyperlipidemia   Chronic iron deficiency anemia   H/O atrial flutter on Xarelto.   Chronic diastolic CHF (congestive heart failure) preserved EF 60-65  (HCC)    LOS: 3 days   Doree Albee  10/25/2022, 9:00 AM    quickly

## 2022-10-25 NOTE — Progress Notes (Signed)
[facility] PROGRESS NOTE    Progress Note  [name]  ONG:[id number] DOB: 1940-07-22 DOA: 10/22/2022 PCP: Kimberly Paradise, MD     Brief Narrative:   [name] is an 82 y.o. female past medical history significant for CAD, history of STEMI in 2015, atrial flutter on Xarelto, essential hypertension, chronic diastolic heart failure with an EF of 60%,  colonoscopy 01/2021 showed transverse colon polyp and rectal polyp and EGD normal findings  comes into the ED for drop in hemoglobin over the last few months so her primary care doctor and hemoglobin was 5.5, last dose of Xarelto was on 10/21/2022.   Assessment/Plan:   Acute on chronic anemia/microcytic anemia/iron deficiency anemia likely due to bleeding gastric polyp: Status post IV iron. GI was consulted status post EGD on 10/24/2022 showed single gastric polyp which was oozing and 3 nonbleeding angiodysplastic lesions treated with APC. Status post 2 units of packed red blood cells her hemoglobin this morning is 8. FOBT negative but her ferritin was 4 and a low MCV (pointing to iron deficiency anemia) which points toward chronic blood loss. She has been in bed for very long time consult PT OT.  Essential hypertension/chronic diastolic heart failure: Amlodipine DC'd as an outpatient due to lower extremity edema. Continue lisinopril and metoprolol. Blood pressures relatively controlled preferably overnight, continue titrate antihypertensive medications as an outpatient.  History of typical flutter on Xarelto: Continue Toprol continue to monitor on telemetry. Hold Xarelto.  GI to dictate when to start anticoagulation.  History of CAD in 2015: Holding Xarelto continue statins.  Hypothyroidism: Continue Synthroid. Abnormal TSH will need to repeated in 4 to 6 weeks once the patient is stable as an outpatient.  DVT prophylaxis: scd's Family Communication:none Status is: Inpatient Remains inpatient appropriate  because: Acute lower GI bleed    Code Status:     Code Status Orders  (From admission, onward)           Start     Ordered   10/22/22 1935  Full code  Continuous       Question:  By:  Answer:  Consent: discussion documented in EHR   10/22/22 1937           Code Status History     Date Active Date Inactive Code Status Order ID Comments User Context   12/22/2019 1138 12/23/2019 2359 Full Code [id number]  Leatha Gilding, MD ED   08/16/2014 1530 08/18/2014 1423 Full Code [id number]  Antionette Char, MD Inpatient   06/26/2014 0125 06/30/2014 1619 Full Code [id number]  Hillary Bow, DO ED   03/24/2013 2011 03/28/2013 1454 Full Code [id number]  Ok Anis, NP Inpatient      Advance Directive Documentation    Flowsheet Row Most Recent Value  Type of Advance Directive Healthcare Power of Attorney  Pre-existing out of facility DNR order (yellow form or pink MOST form) --  "MOST" Form in Place? --         IV Access:   Peripheral IV   Procedures and diagnostic studies:   No results found.   Medical Consultants:   None.   Subjective:    [name] no complaints and really good mood this morning  Objective:    Vitals:   10/24/22 1844 10/24/22 2055 10/25/22 0441 10/25/22 0452  BP: (!) 134/52 (!) 138/52 (!) 140/51   Pulse: 71 71 69   Resp: (!) 22 16 18    Temp: 98.7 F (37.1 C) 99.2 F (  37.3 C) 98.1 F (36.7 C)   TempSrc: Oral Oral Oral   SpO2: 98% 95% 95%   Weight:    87.2 kg  Height:       SpO2: 95 %   Intake/Output Summary (Last 24 hours) at 10/25/2022 0901 Last data filed at 10/24/2022 2207 Gross per 24 hour  Intake 320 ml  Output --  Net 320 ml   Filed Weights   10/23/22 0629 10/24/22 0500 10/25/22 0452  Weight: 92.6 kg 87.8 kg 87.2 kg    Exam: General exam: In no acute distress. Respiratory system: Good air movement and clear to auscultation. Cardiovascular system: S1 & S2 heard, RRR. No JVD. Gastrointestinal system:  Abdomen is nondistended, soft and nontender.  Extremities: No pedal edema. Skin: No rashes, lesions or ulcers Psychiatry: Judgement and insight appear normal. Mood & affect appropriate. Data Reviewed:    Labs: Basic Metabolic Panel: Recent Labs  Lab 10/22/22 1732 10/25/22 0506  NA 137 134*  K 3.5 3.5  CL 105 101  CO2 24 21*  GLUCOSE 103* 95  BUN 16 11  CREATININE 0.61 0.72  CALCIUM 9.2 8.5*   GFR Estimated Creatinine Clearance: 62.4 mL/min (by C-G formula based on SCr of 0.72 mg/dL). Liver Function Tests: Recent Labs  Lab 10/22/22 1732  AST 15  ALT 11  ALKPHOS 45  BILITOT 0.4  PROT 6.8  ALBUMIN 3.6   No results for input(s): "LIPASE", "AMYLASE" in the last 168 hours. No results for input(s): "AMMONIA" in the last 168 hours. Coagulation profile Recent Labs  Lab 10/22/22 2000  INR 1.2   COVID-19 Labs  Recent Labs    10/22/22 1942  FERRITIN 4*    Lab Results  Component Value Date   SARSCOV2NAA NEGATIVE 12/22/2019    CBC: Recent Labs  Lab 10/22/22 1732 10/23/22 0929 10/23/22 1530 10/24/22 0131 10/24/22 1013 10/24/22 1629 10/25/22 0506  WBC 4.9  --   --   --   --   --  5.4  NEUTROABS 3.5  --   --   --   --   --   --   HGB 5.7*   < > 7.9* 8.0* 8.0* 8.1* 7.8*  HCT 21.5*   < > 26.9* 27.4* 27.9* 28.4* 27.7*  MCV 78.5*  --   --   --   --   --  84.2  PLT 229  --   --   --   --   --  193   < > = values in this interval not displayed.   Cardiac Enzymes: No results for input(s): "CKTOTAL", "CKMB", "CKMBINDEX", "TROPONINI" in the last 168 hours. BNP (last 3 results) No results for input(s): "PROBNP" in the last 8760 hours. CBG: No results for input(s): "GLUCAP" in the last 168 hours. D-Dimer: No results for input(s): "DDIMER" in the last 72 hours. Hgb A1c: No results for input(s): "HGBA1C" in the last 72 hours. Lipid Profile: No results for input(s): "CHOL", "HDL", "LDLCALC", "TRIG", "CHOLHDL", "LDLDIRECT" in the last 72 hours. Thyroid function  studies: Recent Labs    10/22/22 1942  TSH 6.867*  T3FREE 2.3   Anemia work up: Recent Labs    10/22/22 1942  VITAMINB12 720  FOLATE 7.4  FERRITIN 4*  TIBC 500*  IRON 20*  RETICCTPCT 3.0   Sepsis Labs: Recent Labs  Lab 10/22/22 1732 10/25/22 0506  WBC 4.9 5.4   Microbiology No results found for this or any previous visit (from the past 240 hour(s)).  Medications:    sodium chloride   Intravenous Once   bisacodyl  10 mg Oral Once   cyanocobalamin  500 mcg Oral Daily   iron polysaccharides  150 mg Oral BID   levothyroxine  100 mcg Oral QAC breakfast   lisinopril  40 mg Oral Daily   metoprolol succinate  50 mg Oral Daily   pantoprazole  40 mg Oral BID   polyethylene glycol  17 g Oral Daily   senna-docusate  1 tablet Oral QHS   sodium chloride flush  3 mL Intravenous Q12H   sucralfate  1 g Oral BID   Continuous Infusions:  sodium chloride        LOS: 3 days   Marinda Elk  [facility]  10/25/2022, 9:01 AM

## 2022-10-25 NOTE — Plan of Care (Signed)
  Problem: Education: Goal: Knowledge of General Education information will improve Description: Including pain rating scale, medication(s)/side effects and non-pharmacologic comfort measures Outcome: Progressing   Problem: Safety: Goal: Ability to remain free from injury will improve Outcome: Progressing   

## 2022-10-25 NOTE — Progress Notes (Signed)
Mobility Specialist - Progress Note   10/25/22 1119  Mobility  Activity Ambulated with assistance in hallway  Level of Assistance Standby assist, set-up cues, supervision of patient - no hands on  Assistive Device Four point cane  Distance Ambulated (ft) 580 ft  Activity Response Tolerated well  Mobility Referral Yes  $Mobility charge 1 Mobility  Mobility Specialist Start Time (ACUTE ONLY) 1058  Mobility Specialist Stop Time (ACUTE ONLY) 1118  Mobility Specialist Time Calculation (min) (ACUTE ONLY) 20 min   Pt received in bed and agreeable to mobility. SpO2 >96% throughout ambulation. No complaints during session. Pt to recliner after session with all needs met.    [facility]

## 2022-10-25 NOTE — Evaluation (Signed)
Physical Therapy Evaluation Patient Details Name: [name] MRN: [id number] DOB: December 09, 1940 Today's Date: 10/25/2022  History of Present Illness  Pt admitted with Hgb 5.5 and now s/p gastric varices banding and Hgb today 7.8.  Pt with hx of MI, arthritic bil knees ("I need replacements but I am not going to do it"), ischemic cardiomyopathy, CAD, R cochlear implant and L hearing aid  Clinical Impression  Pt admitted as above and presenting with functional mobility limitations 2* generalized weakness, decreased endurance and mild ambulatory balance deficits.  This date, pt up to ambulate in halls with QC, good safety awareness and c/o mild SOB only with conversing while ambulating.  Pt very encouraged by session and reports she feels near her baseline and hopes to progress quickly to PLOF and return home.      If plan is discharge home, recommend the following:     Can travel by private vehicle        Equipment Recommendations None recommended by PT  Recommendations for Other Services       Functional Status Assessment Patient has had a recent decline in their functional status and demonstrates the ability to make significant improvements in function in a reasonable and predictable amount of time.     Precautions / Restrictions Precautions Precautions: Fall Restrictions Weight Bearing Restrictions: No      Mobility  Bed Mobility               General bed mobility comments: Pt up in chair and requests back to same    Transfers Overall transfer level: Modified independent Equipment used: Quad cane Transfers: Sit to/from Stand Sit to Stand: Supervision           General transfer comment: for safety only    Ambulation/Gait Ambulation/Gait assistance: Contact guard assist, Supervision Gait Distance (Feet): 600 Feet Assistive device: Quad cane Gait Pattern/deviations: Step-through pattern, Decreased step length - right, Decreased step length - left,  Shuffle       General Gait Details: mild general instability but no overt LOB and pt with good safety awareness.  Stairs            Wheelchair Mobility     Tilt Bed    Modified Rankin (Stroke Patients Only)       Balance Overall balance assessment: Needs assistance Sitting-balance support: No upper extremity supported, Feet supported Sitting balance-Leahy Scale: Good     Standing balance support: No upper extremity supported Standing balance-Leahy Scale: Fair                               Pertinent Vitals/Pain Pain Assessment Pain Assessment: No/denies pain    Home Living Family/patient expects to be discharged to:: Private residence Living Arrangements: Non-relatives/Friends Available Help at Discharge: Family;Friend(s) Type of Home: House Home Access: Stairs to enter Entrance Stairs-Rails: None Entrance Stairs-Number of Steps: 1 Alternate Level Stairs-Number of Steps: flight but rarely goes up Home Layout: Two level;Able to live on main level with bedroom/bathroom Home Equipment: Gilmer Mor - quad;Rollator (4 wheels);Rolling Walker (2 wheels)      Prior Function Prior Level of Function : Independent/Modified Independent             Mobility Comments: Uses QC but admits to reaching for furniture to stabilize initially in am. Discussed use of RW first thing in the morning and pt states will consider it       Extremity/Trunk Assessment  Upper Extremity Assessment Upper Extremity Assessment: Generalized weakness    Lower Extremity Assessment Lower Extremity Assessment: Generalized weakness    Cervical / Trunk Assessment Cervical / Trunk Assessment: Normal  Communication   Communication Communication: Hearing impairment  Cognition Arousal: Alert Behavior During Therapy: WFL for tasks assessed/performed Overall Cognitive Status: Within Functional Limits for tasks assessed                                 General  Comments: Delightful pt and very safety aware        General Comments      Exercises     Assessment/Plan    PT Assessment Patient needs continued PT services  PT Problem List Decreased strength;Decreased range of motion;Decreased activity tolerance;Decreased balance;Decreased mobility;Decreased knowledge of use of DME       PT Treatment Interventions DME instruction;Gait training;Stair training;Functional mobility training;Therapeutic activities;Therapeutic exercise;Patient/family education;Balance training    PT Goals (Current goals can be found in the Care Plan section)  Acute Rehab PT Goals Patient Stated Goal: Regain IND and HOME PT Goal Formulation: With patient Time For Goal Achievement: 11/08/22 Potential to Achieve Goals: Good    Frequency Min 1X/week     Co-evaluation               AM-PAC PT "6 Clicks" Mobility  Outcome Measure Help needed turning from your back to your side while in a flat bed without using bedrails?: None Help needed moving from lying on your back to sitting on the side of a flat bed without using bedrails?: None Help needed moving to and from a bed to a chair (including a wheelchair)?: A Little Help needed standing up from a chair using your arms (e.g., wheelchair or bedside chair)?: A Little Help needed to walk in hospital room?: A Little Help needed climbing 3-5 steps with a railing? : A Little 6 Click Score: 20    End of Session Equipment Utilized During Treatment: Gait belt Activity Tolerance: Patient tolerated treatment well Patient left: in chair;with call bell/phone within reach Nurse Communication: Mobility status PT Visit Diagnosis: Difficulty in walking, not elsewhere classified (R26.2)    Time: 0981-1914 PT Time Calculation (min) (ACUTE ONLY): 21 min   Charges:   PT Evaluation $PT Eval Low Complexity: 1 Low   PT General Charges $$ ACUTE PT VISIT: 1 Visit         [name] PT Acute Rehabilitation  Services Pager [phone] Office [phone]   [name] 10/25/2022, 4:42 PM

## 2022-10-26 DIAGNOSIS — D649 Anemia, unspecified: Secondary | ICD-10-CM | POA: Diagnosis not present

## 2022-10-26 DIAGNOSIS — I5032 Chronic diastolic (congestive) heart failure: Secondary | ICD-10-CM | POA: Diagnosis not present

## 2022-10-26 DIAGNOSIS — D509 Iron deficiency anemia, unspecified: Secondary | ICD-10-CM | POA: Diagnosis not present

## 2022-10-26 DIAGNOSIS — I10 Essential (primary) hypertension: Secondary | ICD-10-CM | POA: Diagnosis not present

## 2022-10-26 MED ORDER — PANTOPRAZOLE SODIUM 40 MG PO TBEC: 40.0000 mg | DELAYED_RELEASE_TABLET | Freq: Two times a day (BID) | ORAL | 2 refills | Status: DC

## 2022-10-26 MED ORDER — SUCRALFATE 1 G PO TABS: 1.0000 g | ORAL_TABLET | Freq: Two times a day (BID) | ORAL | 0 refills | Status: DC

## 2022-10-26 MED ORDER — RIVAROXABAN 20 MG PO TABS: 20.0000 mg | ORAL_TABLET | Freq: Every day | ORAL | Status: AC

## 2022-10-26 NOTE — Discharge Summary (Addendum)
Physician Discharge Summary  [name] UJW:[id number] DOB: [birth year] DOA: 10/22/2022  PCP: Geoffry Paradise, MD  Admit date: 10/22/2022 Discharge date: 10/26/2022  Admitted From: Home Disposition:  Home  Recommendations for Outpatient Follow-up:  Follow up with PCP in 1-2 weeks Please obtain BMP/CBC in one week   Home Health:No Equipment/Devices:None  Discharge Condition:Stable CODE STATUS:Full Diet recommendation: Heart Healthy  Brief/Interim Summary:  82 y.o. female past medical history significant for CAD, history of STEMI in 2015, atrial flutter on Xarelto, essential hypertension, chronic diastolic heart failure with an EF of 60%,  colonoscopy 01/2021 showed transverse colon polyp and rectal polyp and EGD normal findings  comes into the ED for drop in hemoglobin over the last few months so her primary care doctor and hemoglobin was 5.5, last dose of Xarelto was on 10/21/2022.   Discharge Diagnoses:  Principal Problem:   Acute on chronic anemia Active Problems:   Chronic iron deficiency anemia   Essential hypertension   Hypothyroidism   Hyperlipidemia   H/O atrial flutter on Xarelto.   Chronic diastolic CHF (congestive heart failure) preserved EF 60-65 (HCC)   Chronic anticoagulation   Gastric AVM   Gastric polyp   Absolute anemia   Anemia  Acute blood loss anemia/microcytic anemia/iron deficiency anemia likely due to bleeding gastric polyp: He was admitted to the hospital status post 2 units packed red blood cells, her ferritin was low she was also given IV iron. GI was consulted to perform an EGD on 10/24/2022 that showed single gastric polyp which was oozing and 3 nonbleeding angiodysplasia lesions treated with APC. Physical therapy evaluated the patient recommended home health PT.  Essential hypertension/chronic diastolic heart failure: Amlodipine DC'd as an outpatient due to lower extremity edema: No change made to his medication.  History of typical flutter  on Xarelto: Continue Toprol and Xarelto was held she will resume it in 1 week as an outpatient.  History of CAD in 2015: Continue statin holding Xarelto for 1 week.  Hypothyroidism: Continue Synthroid. She will need to have her TSH and free T4 repeated in 6 weeks as an outpatient.  Discharge Instructions  Discharge Instructions     Diet - low sodium heart healthy   Complete by: As directed    Increase activity slowly   Complete by: As directed       Allergies as of 10/26/2022       Reactions   Bee Venom Itching, Swelling   Contrast Media [iodinated Contrast Media] Hives   dye   Fish Allergy Anaphylaxis   Shell fish   Iohexol Hives        Medication List     TAKE these medications    Calcium + D3 600-200 MG-UNIT Tabs Take 1 tablet by mouth every morning.   Ferrex 150 150 MG capsule Generic drug: iron polysaccharides Take 150 mg by mouth 2 (two) times daily.   furosemide 20 MG tablet Commonly known as: LASIX Take 1 tablet (20 mg total) by mouth daily.   levothyroxine 100 MCG tablet Commonly known as: SYNTHROID Take 100 mcg by mouth daily before breakfast.   lisinopril 40 MG tablet Commonly known as: ZESTRIL TAKE ONE TABLET BY MOUTH DAILY   metoprolol succinate 50 MG 24 hr tablet Commonly known as: TOPROL-XL TAKE 1 & 1/2 TABLETS BY MOUTH DAILY take WITH OR immediately following a meal   metroNIDAZOLE 0.75 % gel Commonly known as: METROGEL Apply 1 application topically daily. Patient places on her cheeks and nose, only about  once or twice a week depending on the climate.   nitroGLYCERIN 0.4 MG SL tablet Commonly known as: NITROSTAT DISSOLVE ONE TABLET UNDER THE TONGUE AS NEEDED FOR CHEST PAIN MAY REPEAT IN FIVE MINUTES FOR TWO DOSES   pantoprazole 40 MG tablet Commonly known as: PROTONIX Take 1 tablet (40 mg total) by mouth 2 (two) times daily. Please call to schedule an overdue appointment with Dr. Eldridge Dace for refills, [phone], thank you. 1st  attempt. What changed: when to take this   potassium chloride SA 20 MEQ tablet Commonly known as: KLOR-CON M Take 1 tablet (20 mEq total) by mouth daily.   rivaroxaban 20 MG Tabs tablet Commonly known as: XARELTO Take 1 tablet (20 mg total) by mouth daily with supper. Start taking on: November 01, 2022 What changed: These instructions start on November 01, 2022. If you are unsure what to do until then, ask your doctor or other care provider.   sucralfate 1 g tablet Commonly known as: CARAFATE Take 1 tablet (1 g total) by mouth 2 (two) times daily.   VITAMIN B 12 PO Take 1,000 tablets by mouth daily.        Follow-up Information     Hilarie Fredrickson, MD Follow up on 12/13/2022.   Specialty: Gastroenterology Why: 11:40am is your appointment, please arrive 15 mins early or call our office if this needs to be changed Contact information: [address] [place] [address] [phone]                Allergies  Allergen Reactions   Bee Venom Itching and Swelling   Contrast Media [Iodinated Contrast Media] Hives    dye   Fish Allergy Anaphylaxis    Shell fish   Iohexol Hives    Consultations: Gastroenterology   Procedures/Studies: No results found.   Subjective: No complaints  Discharge Exam: Vitals:   10/25/22 2025 10/26/22 0435  BP: (!) 145/61 (!) 150/54  Pulse: 64 64  Resp: 18 18  Temp: 98.6 F (37 C) 98.3 F (36.8 C)  SpO2: 98% 97%   Vitals:   10/25/22 0950 10/25/22 1135 10/25/22 2025 10/26/22 0435  BP: (!) 146/53 (!) 140/66 (!) 145/61 (!) 150/54  Pulse: 66 71 64 64  Resp:  19 18 18   Temp:  98.4 F (36.9 C) 98.6 F (37 C) 98.3 F (36.8 C)  TempSrc:  Oral Oral Oral  SpO2:  98% 98% 97%  Weight:      Height:        General: Pt is alert, awake, not in acute distress Cardiovascular: RRR, S1/S2 +, no rubs, no gallops Respiratory: CTA bilaterally, no wheezing, no rhonchi Abdominal: Soft, NT, ND, bowel sounds + Extremities: no edema, no  cyanosis    The results of significant diagnostics from this hospitalization (including imaging, microbiology, ancillary and laboratory) are listed below for reference.     Microbiology: No results found for this or any previous visit (from the past 240 hour(s)).   Labs: BNP (last 3 results) No results for input(s): "BNP" in the last 8760 hours. Basic Metabolic Panel: Recent Labs  Lab 10/22/22 1732 10/25/22 0506  NA 137 134*  K 3.5 3.5  CL 105 101  CO2 24 21*  GLUCOSE 103* 95  BUN 16 11  CREATININE 0.61 0.72  CALCIUM 9.2 8.5*   Liver Function Tests: Recent Labs  Lab 10/22/22 1732  AST 15  ALT 11  ALKPHOS 45  BILITOT 0.4  PROT 6.8  ALBUMIN 3.6  No results for input(s): "LIPASE", "AMYLASE" in the last 168 hours. No results for input(s): "AMMONIA" in the last 168 hours. CBC: Recent Labs  Lab 10/22/22 1732 10/23/22 0929 10/23/22 1530 10/24/22 0131 10/24/22 1013 10/24/22 1629 10/25/22 0506  WBC 4.9  --   --   --   --   --  5.4  NEUTROABS 3.5  --   --   --   --   --   --   HGB 5.7*   < > 7.9* 8.0* 8.0* 8.1* 7.8*  HCT 21.5*   < > 26.9* 27.4* 27.9* 28.4* 27.7*  MCV 78.5*  --   --   --   --   --  84.2  PLT 229  --   --   --   --   --  193   < > = values in this interval not displayed.   Cardiac Enzymes: No results for input(s): "CKTOTAL", "CKMB", "CKMBINDEX", "TROPONINI" in the last 168 hours. BNP: Invalid input(s): "POCBNP" CBG: No results for input(s): "GLUCAP" in the last 168 hours. D-Dimer No results for input(s): "DDIMER" in the last 72 hours. Hgb A1c No results for input(s): "HGBA1C" in the last 72 hours. Lipid Profile No results for input(s): "CHOL", "HDL", "LDLCALC", "TRIG", "CHOLHDL", "LDLDIRECT" in the last 72 hours. Thyroid function studies No results for input(s): "TSH", "T4TOTAL", "T3FREE", "THYROIDAB" in the last 72 hours.  Invalid input(s): "FREET3" Anemia work up No results for input(s): "VITAMINB12", "FOLATE", "FERRITIN", "TIBC",  "IRON", "RETICCTPCT" in the last 72 hours. Urinalysis    Component Value Date/Time   COLORURINE STRAW (A) 12/22/2019 1136   APPEARANCEUR CLEAR 12/22/2019 1136   LABSPEC 1.006 12/22/2019 1136   PHURINE 7.0 12/22/2019 1136   GLUCOSEU NEGATIVE 12/22/2019 1136   HGBUR NEGATIVE 12/22/2019 1136   BILIRUBINUR NEGATIVE 12/22/2019 1136   KETONESUR NEGATIVE 12/22/2019 1136   PROTEINUR NEGATIVE 12/22/2019 1136   UROBILINOGEN 0.2 08/17/2014 0919   NITRITE NEGATIVE 12/22/2019 1136   LEUKOCYTESUR NEGATIVE 12/22/2019 1136   Sepsis Labs Recent Labs  Lab 10/22/22 1732 10/25/22 0506  WBC 4.9 5.4   Microbiology No results found for this or any previous visit (from the past 240 hour(s)).   SIGNED:   Marinda Elk, MD  [facility] 10/26/2022, 8:07 AM Pager   If 7PM-7AM, please contact night-coverage [email or web address] Password [email or web address]

## 2022-10-26 NOTE — Plan of Care (Signed)
Reinforce education. Monitor vital signs and labs. No complaint of chest pain or shortness of breath. Telemetry monitoring. Ambulate with assistance/quad cane.

## 2022-10-26 NOTE — Progress Notes (Signed)
Pt d/c instruction given, verbalized understanding of d/c instruction. Using teach back method pt verbalized the need to start the Eliquis on August 16th. Pt d/c home with sister. Condition stable

## 2022-10-28 ENCOUNTER — Encounter (HOSPITAL_COMMUNITY): Payer: Self-pay | Admitting: Gastroenterology

## 2022-10-28 ENCOUNTER — Inpatient Hospital Stay (HOSPITAL_COMMUNITY)
Admission: RE | Admit: 2022-10-28 | Discharge: 2022-10-28 | Disposition: A | Discharge status: Home / Self Care | Payer: Medicare Other | Source: Ambulatory Visit | Attending: Internal Medicine | Admitting: Internal Medicine

## 2022-10-28 NOTE — Telephone Encounter (Signed)
Scheduled the patient for Watchman consult with Dr. Lalla Brothers 02/19/2023. Offered her an earlier appointment in September but she declined. She was grateful for assistance.

## 2022-10-30 ENCOUNTER — Encounter: Payer: Self-pay | Admitting: Gastroenterology

## 2022-11-01 DIAGNOSIS — D649 Anemia, unspecified: Secondary | ICD-10-CM | POA: Diagnosis not present

## 2022-11-01 DIAGNOSIS — I2510 Atherosclerotic heart disease of native coronary artery without angina pectoris: Secondary | ICD-10-CM | POA: Diagnosis not present

## 2022-11-01 DIAGNOSIS — K31819 Angiodysplasia of stomach and duodenum without bleeding: Secondary | ICD-10-CM | POA: Diagnosis not present

## 2022-11-01 DIAGNOSIS — D6869 Other thrombophilia: Secondary | ICD-10-CM | POA: Diagnosis not present

## 2022-11-01 DIAGNOSIS — I509 Heart failure, unspecified: Secondary | ICD-10-CM | POA: Diagnosis not present

## 2022-11-01 DIAGNOSIS — I4892 Unspecified atrial flutter: Secondary | ICD-10-CM | POA: Diagnosis not present

## 2022-11-01 DIAGNOSIS — I110 Hypertensive heart disease with heart failure: Secondary | ICD-10-CM | POA: Diagnosis not present

## 2022-11-01 DIAGNOSIS — E611 Iron deficiency: Secondary | ICD-10-CM | POA: Diagnosis not present

## 2022-11-01 DIAGNOSIS — R0609 Other forms of dyspnea: Secondary | ICD-10-CM | POA: Diagnosis not present

## 2022-11-01 DIAGNOSIS — E785 Hyperlipidemia, unspecified: Secondary | ICD-10-CM | POA: Diagnosis not present

## 2022-11-01 DIAGNOSIS — E039 Hypothyroidism, unspecified: Secondary | ICD-10-CM | POA: Diagnosis not present

## 2022-11-01 DIAGNOSIS — I700 Atherosclerosis of aorta: Secondary | ICD-10-CM | POA: Diagnosis not present

## 2022-11-05 ENCOUNTER — Encounter (HOSPITAL_COMMUNITY): Payer: Medicare Other

## 2022-11-21 ENCOUNTER — Institutional Professional Consult (permissible substitution): Payer: Medicare Other | Admitting: Cardiology

## 2022-11-25 DIAGNOSIS — I700 Atherosclerosis of aorta: Secondary | ICD-10-CM | POA: Diagnosis not present

## 2022-11-25 DIAGNOSIS — I110 Hypertensive heart disease with heart failure: Secondary | ICD-10-CM | POA: Diagnosis not present

## 2022-11-25 DIAGNOSIS — E039 Hypothyroidism, unspecified: Secondary | ICD-10-CM | POA: Diagnosis not present

## 2022-11-25 DIAGNOSIS — E611 Iron deficiency: Secondary | ICD-10-CM | POA: Diagnosis not present

## 2022-11-25 DIAGNOSIS — D649 Anemia, unspecified: Secondary | ICD-10-CM | POA: Diagnosis not present

## 2022-11-25 DIAGNOSIS — I4892 Unspecified atrial flutter: Secondary | ICD-10-CM | POA: Diagnosis not present

## 2022-11-25 DIAGNOSIS — K922 Gastrointestinal hemorrhage, unspecified: Secondary | ICD-10-CM | POA: Diagnosis not present

## 2022-11-25 DIAGNOSIS — I509 Heart failure, unspecified: Secondary | ICD-10-CM | POA: Diagnosis not present

## 2022-12-13 ENCOUNTER — Other Ambulatory Visit (INDEPENDENT_AMBULATORY_CARE_PROVIDER_SITE_OTHER): Payer: Medicare Other

## 2022-12-13 ENCOUNTER — Ambulatory Visit (INDEPENDENT_AMBULATORY_CARE_PROVIDER_SITE_OTHER): Payer: Medicare Other | Admitting: Internal Medicine

## 2022-12-13 ENCOUNTER — Encounter: Payer: Self-pay | Admitting: Internal Medicine

## 2022-12-13 ENCOUNTER — Telehealth: Payer: Self-pay

## 2022-12-13 VITALS — BP 132/80 | HR 61 | Ht 66.0 in | Wt 193.0 lb

## 2022-12-13 DIAGNOSIS — Z7901 Long term (current) use of anticoagulants: Secondary | ICD-10-CM

## 2022-12-13 DIAGNOSIS — K31819 Angiodysplasia of stomach and duodenum without bleeding: Secondary | ICD-10-CM | POA: Diagnosis not present

## 2022-12-13 DIAGNOSIS — D509 Iron deficiency anemia, unspecified: Secondary | ICD-10-CM

## 2022-12-13 DIAGNOSIS — K449 Diaphragmatic hernia without obstruction or gangrene: Secondary | ICD-10-CM | POA: Diagnosis not present

## 2022-12-13 DIAGNOSIS — Z86010 Personal history of colonic polyps: Secondary | ICD-10-CM

## 2022-12-13 LAB — FERRITIN: Ferritin: 11.5 ng/mL (ref 10.0–291.0)

## 2022-12-13 LAB — CBC WITH DIFFERENTIAL/PLATELET
Basophils Absolute: 0.1 10*3/uL (ref 0.0–0.1)
Basophils Relative: 1.3 % (ref 0.0–3.0)
Eosinophils Absolute: 0.1 10*3/uL (ref 0.0–0.7)
Eosinophils Relative: 3.3 % (ref 0.0–5.0)
HCT: 24.6 % — ABNORMAL LOW (ref 36.0–46.0)
Hemoglobin: 7.7 g/dL — CL (ref 12.0–15.0)
Lymphocytes Relative: 21.6 % (ref 12.0–46.0)
Lymphs Abs: 0.8 10*3/uL (ref 0.7–4.0)
MCHC: 31.1 g/dL (ref 30.0–36.0)
MCV: 84.5 fL (ref 78.0–100.0)
Monocytes Absolute: 0.2 10*3/uL (ref 0.1–1.0)
Monocytes Relative: 5.8 % (ref 3.0–12.0)
Neutro Abs: 2.6 10*3/uL (ref 1.4–7.7)
Neutrophils Relative %: 68 % (ref 43.0–77.0)
Platelets: 241 10*3/uL (ref 150.0–400.0)
RBC: 2.92 Mil/uL — ABNORMAL LOW (ref 3.87–5.11)
RDW: 17.9 % — ABNORMAL HIGH (ref 11.5–15.5)
WBC: 3.9 10*3/uL — ABNORMAL LOW (ref 4.0–10.5)

## 2022-12-13 NOTE — Progress Notes (Signed)
HISTORY OF PRESENT ILLNESS:  [name] is a 82 y.o. female with multiple significant medical problems as listed below including coronary artery disease, prior myocardial infarction, coronary artery intervention with stenting, nonsustained ventricular tachycardia and atrial flutter on chronic anticoagulation in the form of Xarelto.  She also has a history of GERD and colon polyps.  She is sent today for a posthospital visit.  She was admitted to the hospital with symptomatic iron deficiency anemia and a hemoglobin of 5.5.  She was Hemoccult negative at that time.  She was transfused.  She underwent upper endoscopy on October 24, 2022.  She was found to have a large hiatal hernia without active Cameron erosions.  She was also found to have a bleeding gastric polyp which was ligated.  Finally, she was found to have nonbleeding angiodysplastic lesions of the stomach which were treated with APC.  Gastric and duodenal biopsies were unremarkable.  She tells me that she felt better after her hospital discharge.  Discharge hemoglobin close to 7.8.  Hemoglobin with her PCP on November 25, 2022 was 9.5.  She is taking an iron supplement twice daily.  She is on PPI once daily.  No longer on Carafate.  She continues on Xarelto.  She is planning on seeing Dr. Lalla Brothers regarding possible Watchman procedure.  She denies melena or hematochezia.  Her stools are dark secondary to oral iron.  She does tell me that she has felt a bit more fatigued and slightly short of breath which makes her wonder about worsening anemia.  She has multiple propria questions.  It should be noted that her last colonoscopy was November 2022 to evaluate heme positive stool.  Examination revealed diminutive adenomas and diverticulosis.  REVIEW OF SYSTEMS:  All non-GI ROS negative unless otherwise stated in the HPI except for arthritis, hearing problems, irregular heartbeat, fatigue, lower extremity edema, and shortness of breath  Past Medical  History:  Diagnosis Date   Acute inferolateral myocardial infarction (HCC) 03/24/2013   Acute MI, inferoposterior wall, initial episode of care (HCC) 03/28/2013   Acute myocardial infarction of other inferior wall, initial episode of care    Amnesia, global, transient    Arthritis    needs left knee replacement   Blood transfusion without reported diagnosis    CAD (coronary artery disease)    a. 03/2013 Infpost STEMI/PCI: LM nl, LAD min irregs, LCX 100 (3.0x12 Vision BMS), RCA  mild ostial dzs, EF 55%.   Essential hypertension, benign    Fibroids    a. uterine - spotting noted since 03/11/2013.   GERD 11/19/2007   Qualifier: Diagnosis of  By: Marina Goodell MD, Wilhemina Bonito    GERD (gastroesophageal reflux disease)    Hearing loss    a. s/p cochlear implant on right, hearing aid on left.   Hernia of anterior abdominal wall    History of cardiovascular stress test    Lexiscan Myoview 8/16:  EF 58%, inferolateral scar, no ischemia; Low Risk   Hyperlipidemia    Hypothyroidism    Ischemic cardiomyopathy    Echo 7/16:  Septal and posterior lateral HK, EF 45-50%, mild LAE   IV Contrast Allergy    Near syncope 06/26/2014   Obesity    Old MI (myocardial infarction) 03/03/2014   Retrograde amnesia 06/26/2014   Rosacea    Ventricular tachycardia (HCC)    a. Immediate post pci/MI - no complications, on BB.    Past Surgical History:  Procedure Laterality Date   BIOPSY  10/24/2022  Procedure: BIOPSY;  Surgeon: Lemar Lofty., MD;  Location: [facility];  Service: Gastroenterology;;   CERVICAL POLYPECTOMY     2010   CHOLECYSTECTOMY     COCHLEAR IMPLANT     ESOPHAGOGASTRODUODENOSCOPY (EGD) WITH PROPOFOL N/A 10/24/2022   Procedure: ESOPHAGOGASTRODUODENOSCOPY (EGD) WITH PROPOFOL;  Surgeon: Lemar Lofty., MD;  Location: [facility];  Service: Gastroenterology;  Laterality: N/A;   GASTRIC VARICES BANDING  10/24/2022   Procedure: GASTRIC VARICES BANDING;  Surgeon: Meridee Score Netty Starring.,  MD;  Location: [facility];  Service: Gastroenterology;;   HOT HEMOSTASIS N/A 10/24/2022   Procedure: HOT HEMOSTASIS (ARGON PLASMA COAGULATION/BICAP);  Surgeon: Lemar Lofty., MD;  Location: [facility];  Service: Gastroenterology;  Laterality: N/A;   LEFT HEART CATHETERIZATION WITH CORONARY ANGIOGRAM N/A 03/25/2013   Procedure: LEFT HEART CATHETERIZATION WITH CORONARY ANGIOGRAM;  Surgeon: Kathleene Hazel, MD;  Location: [facility];  Service: Cardiovascular;  Laterality: N/A;   ROBOTIC ASSISTED TOTAL HYSTERECTOMY WITH BILATERAL SALPINGO OOPHERECTOMY Bilateral 08/16/2014   Procedure: ROBOTIC ASSISTED TOTAL HYSTERECTOMY WITH BILATERAL SALPINGO OOPHORECTOMY ;  Surgeon: Adolphus Birchwood, MD;  Location: [facility];  Service: Gynecology;  Laterality: Bilateral;   UPPER GASTROINTESTINAL ENDOSCOPY     uterine polyp     had D and C done to remove the polyp    Social History TASHEA OTHMAN  reports that she quit smoking about 54 years ago. Her smoking use included cigarettes. She started smoking about 56 years ago. She has a 0.4 pack-year smoking history. She has never used smokeless tobacco. She reports that she does not drink alcohol and does not use drugs.  family history includes Cancer in her mother; Colon cancer in her cousin; Diabetes in her mother and sister; Heart attack in her maternal grandmother and mother; Heart disease in her mother; Heart failure in her mother; Hyperlipidemia in her sister; Hypertension in her mother; Other in her father.  Allergies  Allergen Reactions   Bee Venom Itching and Swelling   Contrast Media [Iodinated Contrast Media] Hives    dye   Fish Allergy Anaphylaxis    Shell fish   Iohexol Hives       PHYSICAL EXAMINATION: Vital signs: BP 132/80   Pulse 61   Ht 5\' 6"  (1.676 m)   Wt 193 lb (87.5 kg)   BMI 31.15 kg/m   Constitutional: generally well-appearing, no acute distress Psychiatric: alert and oriented x3, cooperative Eyes: extraocular  movements intact, anicteric, conjunctiva pale Mouth: oral pharynx moist, no lesions Neck: supple no lymphadenopathy Cardiovascular: heart regular rate and rhythm, no murmur Lungs: clear to auscultation bilaterally Abdomen: soft, nontender, nondistended, no obvious ascites, no peritoneal signs, normal bowel sounds, no organomegaly Rectal: Omitted Extremities: no clubbing, cyanosis.  Trace lower extremity edema bilaterally.  Palmar creases are pale Skin: no lesions on visible extremities Neuro: No focal deficits.  Cranial nerves intact  ASSESSMENT:  1.  Iron deficiency anemia 2.  Chronic anticoagulation 3.  Multiple medical problems 4.  History of diminutive adenomas.  Last colonoscopy November 2022 5.  Large hiatal hernia.  I suspect she does have intermittent Cameron erosions.  This would be a likely cause for iron deficiency anemia. 6.  Small AVMs and bleeding gastric polyp.  Both treated endoscopically on very recent endoscopy last month   PLAN:  1.  Continue oral iron twice daily 2.  Check CBC and ferritin today 3.  Arrange appointment with hematology clinic.  This to monitor blood counts and provide iron supplementation if needed.  Also, to rule out other possible concurrent causes for anemia.  Findings in GI tract as noted. 4.  Keep plans for ointment with electrophysiologist regarding possible Watchman procedure.  If she could get off chronic anticoagulation, this would be quite helpful. 5.  Ongoing general medical care with Dr. Jacky Kindle 6.  No plans for capsule endoscopy at this time as I do not think that it would affect immediate management. A total time of 45 minutes was spent preparing to see the patient, obtaining comprehensive interval history, performing medically appropriate physical exam, counseling and educating the patient regarding the above listed issues, ordering blood work, arranging hematology consultation, and documenting clinical information in the health  record.

## 2022-12-13 NOTE — Telephone Encounter (Signed)
Spoke with pt and she is aware of results and knows Dr. Marina Goodell would like her to be transfused early next week. Have left messages with [facility] and [facility] to call me back regarding getting transfusion appt early next week. [facility] infusion the phone was never answered.  Called cancer center and was transferred and no one ever answered the phone. Called back and pressed 9 for MD office and still never got anyone. Will try again on Monday.

## 2022-12-13 NOTE — Telephone Encounter (Signed)
1.  Let her know her blood counts are indeed lower, as anticipated. 2.  See if you could arrange outpatient transfusion for early next week.  Recommend 2 units of packed red blood cells 3.  I asked Verlon Au, today during the office visit, to schedule her to see hematology.  See my office note.  Please see how soon this appointment can be arranged. Thanks, Dr.  Marina Goodell

## 2022-12-13 NOTE — Telephone Encounter (Signed)
Received call from the lab with critical Hgb of 7.7 Dr. Marina Goodell notified.

## 2022-12-13 NOTE — Patient Instructions (Signed)
Your provider has requested that you go to the basement level for lab work before leaving today. Press "B" on the elevator. The lab is located at the first door on the left as you exit the elevator.  You will be contacted by hematology for an appointment. _______________________________________________________  If your blood pressure at your visit was 140/90 or greater, please contact your primary care physician to follow up on this.  _______________________________________________________  If you are age 2 or older, your body mass index should be between 23-30. Your Body mass index is 31.15 kg/m. If this is out of the aforementioned range listed, please consider follow up with your Primary Care Provider.  If you are age 91 or younger, your body mass index should be between 19-25. Your Body mass index is 31.15 kg/m. If this is out of the aformentioned range listed, please consider follow up with your Primary Care Provider.   ________________________________________________________  The [facility] providers would like to encourage you to use [facility] to communicate with providers for non-urgent requests or questions.  Due to long hold times on the telephone, sending your provider a message by [facility] may be a faster and more efficient way to get a response.  Please allow 48 business hours for a response.  Please remember that this is for non-urgent requests.  _______________________________________________________

## 2022-12-16 ENCOUNTER — Other Ambulatory Visit: Payer: Self-pay

## 2022-12-16 ENCOUNTER — Ambulatory Visit (HOSPITAL_COMMUNITY)
Admission: RE | Admit: 2022-12-16 | Discharge: 2022-12-16 | Disposition: A | Discharge status: Home / Self Care | Payer: Medicare Other | Source: Ambulatory Visit | Attending: Internal Medicine | Admitting: Internal Medicine

## 2022-12-16 ENCOUNTER — Other Ambulatory Visit (HOSPITAL_COMMUNITY): Payer: Self-pay | Admitting: *Deleted

## 2022-12-16 DIAGNOSIS — K31819 Angiodysplasia of stomach and duodenum without bleeding: Secondary | ICD-10-CM | POA: Insufficient documentation

## 2022-12-16 DIAGNOSIS — D509 Iron deficiency anemia, unspecified: Secondary | ICD-10-CM | POA: Insufficient documentation

## 2022-12-16 LAB — PREPARE RBC (CROSSMATCH)

## 2022-12-16 NOTE — Telephone Encounter (Signed)
Pt scheduled to get 2 units PRBC's tomorrow 10/1 8am at [facility]. Pt aware of appt and orders in epic.  Call placed to hematology to check on status of  appt and was told referral was mared as routine and could be 3-4 weeks out. Referral changed to urgent now and pt should be seen in 1-2 weeks. Pt wanted to see Dr. Leonides Schanz.

## 2022-12-17 ENCOUNTER — Encounter (HOSPITAL_COMMUNITY)
Admission: RE | Admit: 2022-12-17 | Discharge: 2022-12-17 | Disposition: A | Discharge status: Home / Self Care | Payer: Medicare Other | Source: Ambulatory Visit | Attending: Internal Medicine | Admitting: Internal Medicine

## 2022-12-17 DIAGNOSIS — D509 Iron deficiency anemia, unspecified: Secondary | ICD-10-CM | POA: Insufficient documentation

## 2022-12-17 DIAGNOSIS — K31819 Angiodysplasia of stomach and duodenum without bleeding: Secondary | ICD-10-CM | POA: Insufficient documentation

## 2022-12-18 LAB — TYPE AND SCREEN
ABO/RH(D): A POS
Antibody Screen: NEGATIVE
Unit division: 0
Unit division: 0

## 2022-12-18 LAB — BPAM RBC
Blood Product Expiration Date: 202410262359
Blood Product Unit Number: 202410262359
ISSUE DATE / TIME: 202410010805
PRODUCT CODE: 202410011038
PRODUCT CODE: 202410262359
Unit Type and Rh: 6200
Unit Type and Rh: 202410262359
Unit Type and Rh: 6200
Unit Type and Rh: 6200

## 2022-12-21 DIAGNOSIS — Z23 Encounter for immunization: Secondary | ICD-10-CM | POA: Diagnosis not present

## 2022-12-26 ENCOUNTER — Inpatient Hospital Stay: Payer: Medicare Other

## 2022-12-26 ENCOUNTER — Inpatient Hospital Stay: Payer: Medicare Other | Attending: Hematology and Oncology | Admitting: Hematology and Oncology

## 2022-12-26 VITALS — BP 161/50 | HR 60 | Temp 97.8°F | Resp 13 | Wt 193.9 lb

## 2022-12-26 DIAGNOSIS — K922 Gastrointestinal hemorrhage, unspecified: Secondary | ICD-10-CM | POA: Insufficient documentation

## 2022-12-26 DIAGNOSIS — Z800 Family history of malignant neoplasm of digestive organs: Secondary | ICD-10-CM | POA: Diagnosis not present

## 2022-12-26 DIAGNOSIS — Z7901 Long term (current) use of anticoagulants: Secondary | ICD-10-CM | POA: Insufficient documentation

## 2022-12-26 DIAGNOSIS — I252 Old myocardial infarction: Secondary | ICD-10-CM | POA: Diagnosis not present

## 2022-12-26 DIAGNOSIS — Q2730 Arteriovenous malformation, site unspecified: Secondary | ICD-10-CM | POA: Insufficient documentation

## 2022-12-26 DIAGNOSIS — D500 Iron deficiency anemia secondary to blood loss (chronic): Secondary | ICD-10-CM

## 2022-12-26 DIAGNOSIS — Z87891 Personal history of nicotine dependence: Secondary | ICD-10-CM | POA: Diagnosis not present

## 2022-12-26 LAB — CBC WITH DIFFERENTIAL (CANCER CENTER ONLY)
Abs Immature Granulocytes: 0.03 10*3/uL (ref 0.00–0.07)
Basophils Absolute: 0 10*3/uL (ref 0.0–0.1)
Basophils Relative: 1 %
Eosinophils Absolute: 0.1 10*3/uL (ref 0.0–0.5)
Eosinophils Relative: 3 %
HCT: 26.2 % — ABNORMAL LOW (ref 36.0–46.0)
Hemoglobin: 8.1 g/dL — ABNORMAL LOW (ref 12.0–15.0)
Immature Granulocytes: 1 %
Lymphocytes Relative: 23 %
Lymphs Abs: 1.1 10*3/uL (ref 0.7–4.0)
MCH: 26.8 pg (ref 26.0–34.0)
MCHC: 30.9 g/dL (ref 30.0–36.0)
MCV: 86.8 fL (ref 80.0–100.0)
Monocytes Absolute: 0.3 10*3/uL (ref 0.1–1.0)
Monocytes Relative: 7 %
Neutro Abs: 2.9 10*3/uL (ref 1.7–7.7)
Neutrophils Relative %: 65 %
Platelet Count: 191 10*3/uL (ref 150–400)
RBC: 3.02 MIL/uL — ABNORMAL LOW (ref 3.87–5.11)
RDW: 15.3 % (ref 11.5–15.5)
WBC Count: 4.5 10*3/uL (ref 4.0–10.5)
nRBC: 0 % (ref 0.0–0.2)

## 2022-12-26 LAB — VITAMIN B12: Vitamin B-12: 982 pg/mL — ABNORMAL HIGH (ref 180–914)

## 2022-12-26 LAB — CMP (CANCER CENTER ONLY)
ALT: 7 U/L (ref 0–44)
AST: 13 U/L — ABNORMAL LOW (ref 15–41)
Albumin: 3.8 g/dL (ref 3.5–5.0)
Alkaline Phosphatase: 55 U/L (ref 38–126)
Anion gap: 5 (ref 5–15)
BUN: 22 mg/dL (ref 8–23)
CO2: 27 mmol/L (ref 22–32)
Calcium: 9.1 mg/dL (ref 8.9–10.3)
Chloride: 105 mmol/L (ref 98–111)
Creatinine: 0.77 mg/dL (ref 0.44–1.00)
GFR, Estimated: >60 mL/min (ref >60)
Glucose, Bld: 92 mg/dL (ref 70–99)
Potassium: 3.7 mmol/L (ref 3.5–5.1)
Sodium: 137 mmol/L (ref 135–145)
Total Bilirubin: 0.3 mg/dL (ref 0.3–1.2)
Total Protein: 6.6 g/dL (ref 6.5–8.1)

## 2022-12-26 LAB — IRON AND IRON BINDING CAPACITY (CC-WL,HP ONLY)
Iron: 18 ug/dL — ABNORMAL LOW (ref 28–170)
Saturation Ratios: 4 % — ABNORMAL LOW (ref 10.4–31.8)
TIBC: 456 ug/dL — ABNORMAL HIGH (ref 250–450)
UIBC: 438 ug/dL (ref 148–442)

## 2022-12-26 LAB — SAMPLE TO BLOOD BANK

## 2022-12-26 LAB — FOLATE: Folate: 6.4 ng/mL (ref >5.9)

## 2022-12-26 LAB — RETIC PANEL
Immature Retic Fract: 16.6 % — ABNORMAL HIGH (ref 2.3–15.9)
RBC.: 3.05 MIL/uL — ABNORMAL LOW (ref 3.87–5.11)
Retic Count, Absolute: 78.5 10*3/uL (ref 19.0–186.0)
Retic Ct Pct: 2.6 % (ref 0.4–3.1)
Reticulocyte Hemoglobin: 22.6 pg — ABNORMAL LOW (ref >27.9)

## 2022-12-26 NOTE — Progress Notes (Signed)
[facility] [phone]   [phone]  INITIAL CONSULT NOTE  Patient Care Team: Kimberly Paradise, MD as PCP - General (Internal Medicine) Kimberly Hazel, MD as Consulting Physician (Cardiology)  Hematological/Oncological History # Iron Deficiency Anemia 2/2 to GI Bleeding 10/24/2022: Upper GI endoscopy showed 3 angiodysplastic lesions with no bleeding. Treated with argon plasma. Erythematous mucosa noted in stomach lining. Biopsy showed no H. Pylori but did not reactive gastropathy.  12/13/2022: WBC 3.9, Hgb 7.7, MCV 84.5, Plt 241 12/26/2022: establish care with [name]   CHIEF COMPLAINTS/PURPOSE OF CONSULTATION:  "Iron Deficiency Anemia "  HISTORY OF PRESENTING ILLNESS:  [name] 82 y.o. female with medical history significant for CAD, GERD, HTN, HLD, hypothyroidism, and obesity present for evaluation of iron deficiency anemia 2/2 to GI bleeding.   On review of the previous records [name] underwent upper GI endoscopy on 10/24/2022 which showed 3 angiodysplastic lesions with no bleeding.  These were treated with argon plasma.  There is also erythema noted in the stomach lining.  She was started on Carafate therapy.  Most recently on 12/13/2022 the patient had labs drawn which showed white blood cell count 3.9, hemoglobin 7.7, MCV 84.5, and platelets of 241.  Due to concern for persistent iron deficiency anemia the patient was referred to hematology for further evaluation and management.  On exam today reports that this began in June 2024 when she was noted to have "blood in her stool samples".  She had fecal occult blood testing done which was performed 3 times.  She reports at 1 point her hemoglobin dropped to 5.5 and she had to go to emergency department received 2 units of packed red blood cells and "2 bags of iron".  She notes that she has undergone endoscopy and was told that she had AVMs.  She took Carafate for 2 to 3 weeks.  She  reports that she follows with [name] and gastroenterology.  She notes that most recently on 12/17/2022 she received another 2 units of packed red blood cells.  She reports she is not seeing any gross red blood in the stool or any bleeding from elsewhere.  She does endorse being tired and having lightheadedness, dizziness, and shortness of breath.  She reports that she has been having some ringing in her ears as well as dry mouth.  She notes that she has to sometimes abdominal washing the dishes.  She reports that she is taking 3 iron pills per day at this time.  She notes that she is doing her best to try to eat a good diet including some red meat but mostly chicken.  She notes that she is also taking Xarelto and they are in discussions for conversion to a Watchman device.  On further discussion she reports that her father had a history of anemia and bone marrow disease due to radiation exposure.  Her mother and her sister both have type 2 diabetes and hypertension.  She personally has a cochlear implant.  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  She otherwise denies any fevers, chills, sweats, nausea, vomiting or diarrhea.  MEDICAL HISTORY:  Past Medical History:  Diagnosis Date   Acute inferolateral myocardial infarction (HCC) 03/24/2013   Acute MI, inferoposterior wall, initial episode of care (HCC) 03/28/2013   Acute myocardial infarction of other inferior wall, initial episode of care    Amnesia, global, transient  Arthritis    needs left knee replacement   Blood transfusion without reported diagnosis    CAD (coronary artery disease)    a. 03/2013 Infpost STEMI/PCI: LM nl, LAD min irregs, LCX 100 (3.0x12 Vision BMS), RCA  mild ostial dzs, EF 55%.   Essential hypertension, benign    Fibroids    a. uterine - spotting noted since 03/11/2013.   GERD 11/19/2007   Qualifier: Diagnosis of  By: Kimberly Goodell MD, [name]    GERD  (gastroesophageal reflux disease)    Hearing loss    a. s/p cochlear implant on right, hearing aid on left.   Hernia of anterior abdominal wall    History of cardiovascular stress test    Lexiscan Myoview 8/16:  EF 58%, inferolateral scar, no ischemia; Low Risk   Hyperlipidemia    Hypothyroidism    Ischemic cardiomyopathy    Echo 7/16:  Septal and posterior lateral HK, EF 45-50%, mild LAE   IV Contrast Allergy    Near syncope 06/26/2014   Obesity    Old MI (myocardial infarction) 03/03/2014   Retrograde amnesia 06/26/2014   Rosacea    Ventricular tachycardia (HCC)    a. Immediate post pci/MI - no complications, on BB.    SURGICAL HISTORY: Past Surgical History:  Procedure Laterality Date   BIOPSY  10/24/2022   Procedure: BIOPSY;  Surgeon: Mansouraty, Netty Starring., MD;  Location: [facility];  Service: Gastroenterology;;   CERVICAL POLYPECTOMY     2010   CHOLECYSTECTOMY     COCHLEAR IMPLANT     ESOPHAGOGASTRODUODENOSCOPY (EGD) WITH PROPOFOL N/A 10/24/2022   Procedure: ESOPHAGOGASTRODUODENOSCOPY (EGD) WITH PROPOFOL;  Surgeon: Kimberly Lofty., MD;  Location: [facility];  Service: Gastroenterology;  Laterality: N/A;   GASTRIC VARICES BANDING  10/24/2022   Procedure: GASTRIC VARICES BANDING;  Surgeon: Kimberly Score Netty Starring., MD;  Location: [facility];  Service: Gastroenterology;;   HOT HEMOSTASIS N/A 10/24/2022   Procedure: HOT HEMOSTASIS (ARGON PLASMA COAGULATION/BICAP);  Surgeon: Kimberly Lofty., MD;  Location: [facility];  Service: Gastroenterology;  Laterality: N/A;   LEFT HEART CATHETERIZATION WITH CORONARY ANGIOGRAM N/A 03/25/2013   Procedure: LEFT HEART CATHETERIZATION WITH CORONARY ANGIOGRAM;  Surgeon: Kimberly Hazel, MD;  Location: [facility];  Service: Cardiovascular;  Laterality: N/A;   ROBOTIC ASSISTED TOTAL HYSTERECTOMY WITH BILATERAL SALPINGO OOPHERECTOMY Bilateral 08/16/2014   Procedure: ROBOTIC ASSISTED TOTAL HYSTERECTOMY WITH BILATERAL SALPINGO  OOPHORECTOMY ;  Surgeon: Kimberly Birchwood, MD;  Location: [facility];  Service: Gynecology;  Laterality: Bilateral;   UPPER GASTROINTESTINAL ENDOSCOPY     uterine polyp     had D and C done to remove the polyp    SOCIAL HISTORY: Social History   Socioeconomic History   Marital status: Divorced    Spouse name: Not on file   Number of children: Not on file   Years of education: Not on file   Highest education level: Not on file  Occupational History   Occupation: retired from [employer], industrial/product work  Tobacco Use   Smoking status: Former    Current packs/day: 0.00    Average packs/day: 0.2 packs/day for 2.0 years (0.4 ttl pk-yrs)    Types: Cigarettes    Start date: 03/18/1966    Quit date: 03/18/1968    Years since quitting: 54.8   Smokeless tobacco: Never  Vaping Use   Vaping status: Never Used  Substance and Sexual Activity   Alcohol use: No   Drug use: No   Sexual activity: Not Currently  Other Topics Concern  Not on file  Social History Narrative   Lives in [place] by herself.  Sister is nearby and is Healthcare POA.   Social Determinants of Health   Financial Resource Strain: Not on file  Food Insecurity: No Food Insecurity (12/26/2022)   Hunger Vital Sign    Worried About Running Out of Food in the Last Year: Never true    Ran Out of Food in the Last Year: Never true  Transportation Needs: No Transportation Needs (12/26/2022)   PRAPARE - Administrator, Civil Service (Medical): No    Lack of Transportation (Non-Medical): No  Physical Activity: Not on file  Stress: Not on file  Social Connections: Not on file  Intimate Partner Violence: Not At Risk (12/26/2022)   Humiliation, Afraid, Rape, and Kick questionnaire    Fear of Current or Ex-Partner: No    Emotionally Abused: No    Physically Abused: No    Sexually Abused: No    FAMILY HISTORY: Family History  Problem Relation Age of Onset   Heart failure Mother        died in her 30's.   Cancer Mother    Diabetes  Mother    Heart disease Mother    Hypertension Mother    Heart attack Mother    Other Father        died in WWII   Hyperlipidemia Sister    Diabetes Sister    Heart attack Maternal Grandmother    Colon cancer Cousin    Esophageal cancer Neg Hx    Stomach cancer Neg Hx    Stroke Neg Hx    Rectal cancer Neg Hx     ALLERGIES:  is allergic to bee venom, contrast media [iodinated contrast media], fish allergy, and iohexol.  MEDICATIONS:  Current Outpatient Medications  Medication Sig Dispense Refill   Calcium Carb-Cholecalciferol (CALCIUM + D3) 600-200 MG-UNIT TABS Take 1 tablet by mouth every morning.      Cyanocobalamin (VITAMIN B 12 PO) Take 1,000 tablets by mouth daily.     FERREX 150 150 MG capsule Take 150 mg by mouth 2 (two) times daily.     furosemide (LASIX) 20 MG tablet Take 1 tablet (20 mg total) by mouth daily. 90 tablet 1   levothyroxine (SYNTHROID, LEVOTHROID) 100 MCG tablet Take 100 mcg by mouth daily before breakfast.     lisinopril (ZESTRIL) 40 MG tablet TAKE ONE TABLET BY MOUTH DAILY 90 tablet 3   metoprolol succinate (TOPROL-XL) 50 MG 24 hr tablet TAKE 1 & 1/2 TABLETS BY MOUTH DAILY take WITH OR immediately following a meal (Patient taking differently: Pt taking 1 pill a day) 135 tablet 3   metroNIDAZOLE (METROGEL) 0.75 % gel Apply 1 application topically daily. Patient places on her cheeks and nose, only about once or twice a week depending on the climate.     nitroGLYCERIN (NITROSTAT) 0.4 MG SL tablet DISSOLVE ONE TABLET UNDER THE TONGUE AS NEEDED FOR CHEST PAIN MAY REPEAT IN FIVE MINUTES FOR TWO DOSES 25 tablet 3   pantoprazole (PROTONIX) 40 MG tablet Take 1 tablet (40 mg total) by mouth 2 (two) times daily. Please call to schedule an overdue appointment with Dr. Eldridge Dace for refills, [phone], thank you. 1st attempt. 60 tablet 2   potassium chloride SA (KLOR-CON M) 20 MEQ tablet Take 1 tablet (20 mEq total) by mouth daily. 90 tablet 1   rivaroxaban (XARELTO) 20  MG TABS tablet Take 1 tablet (20 mg total) by mouth daily with  supper.     sucralfate (CARAFATE) 1 g tablet Take 1 tablet (1 g total) by mouth 2 (two) times daily. 60 tablet 0   Current Facility-Administered Medications  Medication Dose Route Frequency Provider Last Rate Last Admin   0.9 %  sodium chloride infusion  500 mL Intravenous Once Hilarie Fredrickson, MD        REVIEW OF SYSTEMS:   Constitutional: ( - ) fevers, ( - )  chills , ( - ) night sweats Eyes: ( - ) blurriness of vision, ( - ) double vision, ( - ) watery eyes Ears, nose, mouth, throat, and face: ( - ) mucositis, ( - ) sore throat Respiratory: ( - ) cough, ( - ) dyspnea, ( - ) wheezes Cardiovascular: ( - ) palpitation, ( - ) chest discomfort, ( - ) lower extremity swelling Gastrointestinal:  ( - ) nausea, ( - ) heartburn, ( - ) change in bowel habits Skin: ( - ) abnormal skin rashes Lymphatics: ( - ) new lymphadenopathy, ( - ) easy bruising Neurological: ( - ) numbness, ( - ) tingling, ( - ) new weaknesses Behavioral/Psych: ( - ) mood change, ( - ) new changes  All other systems were reviewed with the patient and are negative.  PHYSICAL EXAMINATION:  Vitals:   12/26/22 1416 12/26/22 1459  BP: (!) 177/49 (!) 161/50  Pulse: 60   Resp: 13   Temp: 97.8 F (36.6 C)   SpO2: 100%    Filed Weights   12/26/22 1416  Weight: 193 lb 14.4 oz (88 kg)    GENERAL: well appearing well-appearing elderly Caucasian female in NAD  SKIN: skin color, texture, turgor are normal, no rashes or significant lesions EYES: conjunctiva are pink and non-injected, sclera clear LUNGS: clear to auscultation and percussion with normal breathing effort HEART: regular rate & rhythm and no murmurs and no lower extremity edema Musculoskeletal: no cyanosis of digits and no clubbing  PSYCH: alert & oriented x 3, fluent speech NEURO: no focal motor/sensory deficits  LABORATORY DATA:  I have reviewed the data as listed    Latest Ref Rng & Units  12/26/2022    3:44 PM 12/13/2022   12:50 PM 10/25/2022    5:06 AM  CBC  WBC 4.0 - 10.5 K/uL 4.5  3.9  5.4   Hemoglobin 12.0 - 15.0 g/dL 8.1  7.7 Repeated and verified X2.  7.8   Hematocrit 36.0 - 46.0 % 26.2  24.6 Repeated and verified X2.  27.7   Platelets 150 - 400 K/uL 191  241.0  193        Latest Ref Rng & Units 12/26/2022    3:44 PM 10/25/2022    5:06 AM 10/22/2022    5:32 PM  CMP  Glucose 70 - 99 mg/dL 92  95  161   BUN 8 - 23 mg/dL 22  11  16    Creatinine 0.44 - 1.00 mg/dL 0.96  0.45  4.09   Sodium 135 - 145 mmol/L 137  134  137   Potassium 3.5 - 5.1 mmol/L 3.7  3.5  3.5   Chloride 98 - 111 mmol/L 105  101  105   CO2 22 - 32 mmol/L 27  21  24    Calcium 8.9 - 10.3 mg/dL 9.1  8.5  9.2   Total Protein 6.5 - 8.1 g/dL 6.6   6.8   Total Bilirubin 0.3 - 1.2 mg/dL 0.3   0.4   Alkaline Phos 38 - 126  U/L 55   45   AST 15 - 41 U/L 13   15   ALT 0 - 44 U/L 7   11      ASSESSMENT & PLAN [name] 82 y.o. female with medical history significant for CAD, GERD, HTN, HLD, hypothyroidism, and obesity present for evaluation of iron deficiency anemia 2/2 to GI bleeding.   After review of the labs, review of the records, and discussion with the patient the patients findings are most consistent with anemia secondary to bleeding AVMs.  # Iron Deficiency Anemia 2/2 to GI Bleeding -- Findings are consistent with iron deficiency anemia secondary to patient's AVMs --Encouraged her to follow-up with GI for continued evaluation of her GI bleeding. Agree with capsule endoscopy if iron deficiency is persistent despite continued iron infusions.  --We will confirm iron deficiency anemia by ordering iron panel and ferritin as well as reticulocytes, CBC, and CMP --Continue iron supplementation daily with a source of vitamin C --We will plan to proceed with IV iron therapy in order to help bolster the patient's blood counts --Plan for return to clinic in 4 to 6 weeks time after last dose of IV iron    Orders Placed This Encounter  Procedures   CBC with Differential (Cancer Center Only)    Standing Status:   Future    Number of Occurrences:   1    Standing Expiration Date:   12/26/2023   CMP (Cancer Center only)    Standing Status:   Future    Number of Occurrences:   1    Standing Expiration Date:   12/26/2023   Ferritin    Standing Status:   Future    Number of Occurrences:   1    Standing Expiration Date:   12/26/2023   Iron and Iron Binding Capacity ([facility] only)    Standing Status:   Future    Number of Occurrences:   1    Standing Expiration Date:   12/26/2023   Retic Panel    Standing Status:   Future    Number of Occurrences:   1    Standing Expiration Date:   12/26/2023   Vitamin B12    Standing Status:   Future    Number of Occurrences:   1    Standing Expiration Date:   12/26/2023   Methylmalonic acid, serum    Standing Status:   Future    Number of Occurrences:   1    Standing Expiration Date:   12/26/2023   Folate, Serum    Standing Status:   Future    Number of Occurrences:   1    Standing Expiration Date:   12/26/2023   Sample to Blood Bank    Standing Status:   Future    Number of Occurrences:   1    Standing Expiration Date:   12/26/2023    All questions were answered. The patient knows to call the clinic with any problems, questions or concerns.  A total of more than 60 minutes were spent on this encounter with face-to-face time and non-face-to-face time, including preparing to see the patient, ordering tests and/or medications, counseling the patient and coordination of care as outlined above.   Ulysees Barns, MD Department of Hematology/Oncology [facility] Phone: [phone] Pager: [phone] Email: [email or web address]  12/26/2022 4:55 PM

## 2022-12-27 ENCOUNTER — Telehealth: Payer: Self-pay | Admitting: Pharmacy Technician

## 2022-12-27 ENCOUNTER — Telehealth: Payer: Self-pay | Admitting: Hematology and Oncology

## 2022-12-27 LAB — FERRITIN: Ferritin: 9 ng/mL — ABNORMAL LOW (ref 11–307)

## 2022-12-27 NOTE — Telephone Encounter (Signed)
Auth Submission: NO AUTH NEEDED Site of care: Site of care: [facility] Payer: medicare a/b & aarp Medication & CPT/J Code(s) submitted: Monoferric (Ferrci derisomaltose) [phone] Route of submission (phone, fax, portal):  Phone # Fax # Auth type: Buy/Bill HB Units/visits requested: 1 Reference number:  Approval from: 12/27/22 to 03/18/23     Medicare will cover 80% and AARP will pick-up remaining 20%

## 2022-12-29 LAB — METHYLMALONIC ACID, SERUM: Methylmalonic Acid, Quantitative: 136 nmol/L (ref 0–378)

## 2022-12-31 ENCOUNTER — Ambulatory Visit: Payer: Medicare Other

## 2022-12-31 ENCOUNTER — Telehealth: Payer: Self-pay | Admitting: *Deleted

## 2022-12-31 VITALS — BP 123/62 | HR 58 | Temp 98.2°F | Resp 16 | Ht 66.0 in | Wt 192.8 lb

## 2022-12-31 DIAGNOSIS — Q2730 Arteriovenous malformation, site unspecified: Secondary | ICD-10-CM | POA: Diagnosis not present

## 2022-12-31 DIAGNOSIS — D500 Iron deficiency anemia secondary to blood loss (chronic): Secondary | ICD-10-CM

## 2022-12-31 DIAGNOSIS — K922 Gastrointestinal hemorrhage, unspecified: Secondary | ICD-10-CM | POA: Diagnosis not present

## 2022-12-31 MED ORDER — SODIUM CHLORIDE 0.9 % IV SOLN
1000.0000 mg | Freq: Once | INTRAVENOUS | Status: AC
  Administered 2022-12-31: 1000 mg via INTRAVENOUS
  Filled 2022-12-31: qty 10

## 2022-12-31 NOTE — Telephone Encounter (Signed)
Patient evaluated alert and oriented.  Denies any breathing issues.  No tongue swelling or lip swelling.  Advised her to call Dr. Derek Mound office to notify them of her symptoms.  Discussed calling EMS for further evaluation patient declines.  Says that she feels okay just feels itchy all over.  No rash noted.  Along the previous IV site some small patchy red areas.  No red streaking.  No fever. Advised that she could try Benadryl 25 mg and Pepcid 20 mg at home . Patient is advised if symptoms do not improve or worsen she is to seek emergency room care. Vital signs were stable.  Breathing was unlabored. Please contact office for sooner follow up if symptoms do not improve or worsen or seek emergency care

## 2022-12-31 NOTE — Progress Notes (Signed)
Diagnosis: Acute Anemia  Provider:  Chilton Greathouse MD  Procedure: IV Infusion  IV Type: Peripheral, IV Location: L Antecubital  Monoferric (Ferric Derisomaltose), Dose: 1000 mg  Infusion Start Time: 1502  Infusion Stop Time: 1527  Post Infusion IV Care: Observation period completed and Peripheral IV Discontinued  Discharge: Condition: Good, Destination: Home . AVS Declined  Performed by:  Wyvonne Lenz, RN

## 2022-12-31 NOTE — Telephone Encounter (Signed)
Pt had her first iron infusion today at the infusion center here at 3 pm  She presented to the office at 4:59 PM with complaints of itching on the back of her legs  She denied any SOB, tongue swelling, or other symptoms  Pt advised to take benadryl 25 mg and pepcid 20 mg otc and to reach out to Dr. Leonides Schanz  Pt aware to seek emergency care if she develops any difficulty with her breathing  Sats 99% ra and pulse 70

## 2023-01-02 DIAGNOSIS — H00025 Hordeolum internum left lower eyelid: Secondary | ICD-10-CM | POA: Diagnosis not present

## 2023-01-02 DIAGNOSIS — I10 Essential (primary) hypertension: Secondary | ICD-10-CM | POA: Diagnosis not present

## 2023-01-02 DIAGNOSIS — H35033 Hypertensive retinopathy, bilateral: Secondary | ICD-10-CM | POA: Diagnosis not present

## 2023-01-09 DIAGNOSIS — H00025 Hordeolum internum left lower eyelid: Secondary | ICD-10-CM | POA: Diagnosis not present

## 2023-01-09 DIAGNOSIS — I10 Essential (primary) hypertension: Secondary | ICD-10-CM | POA: Diagnosis not present

## 2023-02-19 ENCOUNTER — Ambulatory Visit: Payer: Medicare Other | Attending: Cardiology | Admitting: Cardiology

## 2023-02-19 ENCOUNTER — Other Ambulatory Visit: Payer: Self-pay

## 2023-02-19 ENCOUNTER — Encounter: Payer: Self-pay | Admitting: Cardiology

## 2023-02-19 ENCOUNTER — Other Ambulatory Visit: Payer: Self-pay | Admitting: *Deleted

## 2023-02-19 VITALS — BP 186/122 | HR 74 | Ht 66.0 in | Wt 186.0 lb

## 2023-02-19 DIAGNOSIS — I4892 Unspecified atrial flutter: Secondary | ICD-10-CM | POA: Insufficient documentation

## 2023-02-19 DIAGNOSIS — D500 Iron deficiency anemia secondary to blood loss (chronic): Secondary | ICD-10-CM

## 2023-02-19 DIAGNOSIS — I480 Paroxysmal atrial fibrillation: Secondary | ICD-10-CM | POA: Diagnosis not present

## 2023-02-19 DIAGNOSIS — I10 Essential (primary) hypertension: Secondary | ICD-10-CM | POA: Insufficient documentation

## 2023-02-19 NOTE — Patient Instructions (Signed)
 Medication Instructions:  Your physician recommends that you continue on your current medications as directed. Please refer to the Current Medication list given to you today.  *If you need a refill on your cardiac medications before your next appointment, please call your pharmacy*  Testing/Procedures: Your physician has requested that you have Left atrial appendage (LAA) closure device implantation is a procedure to put a small device in the LAA of the heart. The LAA is a small sac in the wall of the heart's left upper chamber. Blood clots can form in this area. The device, Watchman closes the LAA to help prevent a blood clot and stroke.   Follow-Up: At [facility], you and your health needs are our priority.  As part of our continuing mission to provide you with exceptional heart care, we have created designated Provider Care Teams.  These Care Teams include your primary Cardiologist (physician) and Advanced Practice Providers (APPs -  Physician Assistants and Nurse Practitioners) who all work together to provide you with the care you need, when you need it.  Your next appointment:   You will be contacted by Nurse Navigator, Karsten Fells to schedule your pre-procedure visit and procedure date. If you have any questions she can be reached at [phone].

## 2023-02-19 NOTE — Progress Notes (Signed)
Electrophysiology Office Note:    Date:  02/19/2023   ID:  [name], [name] [birth year], MRN [id number]  [facility] Cardiologist:  None  [facility] Electrophysiologist:  Lanier Prude, MD   Referring MD: Geoffry Paradise, MD   Chief Complaint: Atrial fibrillation  History of Present Illness:    [name] is an 82 year old woman who I am seeing today for an evaluation of atrial fibrillation and anemia at the request of Dr Eldridge Dace.  The patient has a history of coronary artery disease, atrial fibrillation and flutter on Xarelto, hypertension, chronic diastolic heart failure.  She was admitted to the hospital in August of this year with severe anemia with a hemoglobin of 5.5.  It was felt that her anemia was secondary to GI blood loss.  She received blood transfusion during the hospitalization  Last hemoglobin on December 26, 2022 was 8.1.    Discussed the use of AI scribe software for clinical note transcription with the patient, who gave verbal consent to proceed.  History of Present Illness   [name], a patient with a history of atrial flutter and atrial fibrillation, presents with concerns about her stroke risk due to her atrial conditions. She is currently on Xarelto, a blood thinner, which she is tolerating well. However, she has a history of bleeding and anemia, which has been managed with iron infusions and blood transfusions. The patient's anemia has been severe, with her hemoglobin levels falling to as low as 5.5. She reports feeling significantly unwell when her hemoglobin was at this level, experiencing difficulty with walking and breathing. Her hemoglobin levels have since improved, with her most recent level being in the eights. The patient is also considering a Watchman device implantation to address her stroke risk. She has researched the procedure and understands that it involves the placement of a plug in the left atrial appendage to prevent blood clots  from forming and causing strokes.               Their past medical, social and family history was reveiwed.   ROS:   Please see the history of present illness.    All other systems reviewed and are negative.  EKGs/Labs/Other Studies Reviewed:    The following studies were reviewed today:  December 22, 2019 echo EF 60-65 RV function normal Moderately dilated left atrium No significant valvular disease  EKG from December 22, 2019 shows atrial fibrillation/flutter       Physical Exam:    VS:  BP (!) 186/122   Pulse 74   Ht 5\' 6"  (1.676 m)   Wt 186 lb (84.4 kg)   SpO2 97%   BMI 30.02 kg/m     Repeat BP: 170/84 Wt Readings from Last 3 Encounters:  02/19/23 186 lb (84.4 kg)  12/31/22 192 lb 12.8 oz (87.5 kg)  12/26/22 193 lb 14.4 oz (88 kg)    GEN: no distress. Elderly. CARD: RRR, No MRG RESP: No IWOB. CTAB.       ASSESSMENT AND PLAN:    1. Atrial flutter, unspecified type (HCC)   2. Primary hypertension   3. Paroxysmal atrial fibrillation (HCC)     #Atrial fibrillation and flutter #Severe anemia The patient has atrial fibrillation on Xarelto for stroke prophylaxis.  Unfortunately, her therapy with Xarelto has been complicated by severe anemia requiring blood transfusion.  Today we discussed left atrial appendage occlusion as a stroke risk mitigation strategy and she is interested in proceeding.  --------------  I have  seen [name] in the office today who is being considered for a Watchman left atrial appendage closure device. I believe they will benefit from this procedure given their history of atrial fibrillation, CHA2DS2-VASc score of 8 and unadjusted ischemic stroke rate of 10.8% per year. Unfortunately, the patient is not felt to be a long term anticoagulation candidate secondary to history of GI bleeding. The patient's chart has been reviewed and I feel that they would be a candidate for short term oral anticoagulation after Watchman  implant.   It is my belief that after undergoing a LAA closure procedure, [name] will not need long term anticoagulation which eliminates anticoagulation side effects and major bleeding risk.   Procedural risks for the Watchman implant have been reviewed with the patient including a 0.5% risk of stroke, <1% risk of perforation and <1% risk of device embolization. Other risks include bleeding, vascular damage, tamponade, worsening renal function, and death. The patient understands these risk and wishes to proceed.     The published clinical data on the safety and effectiveness of WATCHMAN include but are not limited to the following: - Holmes DR, Everlene Farrier, Sick P et al. for the PROTECT AF Investigators. Percutaneous closure of the left atrial appendage versus warfarin therapy for prevention of stroke in patients with atrial fibrillation: a randomised non-inferiority trial. Lancet 2009; 374: 534-42. Everlene Farrier, Doshi SK, Isa Rankin D et al. on behalf of the PROTECT AF Investigators. Percutaneous Left Atrial Appendage Closure for Stroke Prophylaxis in Patients With Atrial Fibrillation 2.3-Year Follow-up of the PROTECT AF (Watchman Left Atrial Appendage System for Embolic Protection in Patients With Atrial Fibrillation) Trial. Circulation 2013; 127:720-729. - Alli O, Doshi S,  Kar S, Reddy VY, Sievert H et al. Quality of Life Assessment in the Randomized PROTECT AF (Percutaneous Closure of the Left Atrial Appendage Versus Warfarin Therapy for Prevention of Stroke in Patients With Atrial Fibrillation) Trial of Patients at Risk for Stroke With Nonvalvular Atrial Fibrillation. J Am Coll Cardiol 2013; 61:1790-8. Aline August DR, Mia [place], Price M, Whisenant B, Sievert H, Doshi S, Huber K, Reddy V. Prospective randomized evaluation of the Watchman left atrial appendage Device in patients with atrial fibrillation versus long-term warfarin therapy; the PREVAIL trial. Journal of the [employer] of  Cardiology, Vol. 4, No. 1, 2014, 1-11. - Kar S, Doshi SK, Sadhu A, Horton R, Osorio J et al. Primary outcome evaluation of a next-generation left atrial appendage closure device: results from the PINNACLE FLX trial. Circulation 2021;143(18)1754-1762.    After today's visit with the patient which was dedicated solely for shared decision making visit regarding LAA closure device, the patient decided to proceed with the LAA appendage closure procedure scheduled to be done in the near future at [facility]. Prior to the procedure, I would like to obtain a gated CT scan of the chest with contrast timed for PV/LA visualization.     HAS-BLED score 4 Hypertension Yes  Abnormal renal and liver function (Dialysis, transplant, Cr >2.26 mg/dL /Cirrhosis or Bilirubin >2x Normal or AST/ALT/AP >3x Normal) No  Stroke Yes  Bleeding Yes  Labile INR (Unstable/high INR) No  Elderly (>65) Yes  Drugs or alcohol (>= 8 drinks/week, anti-plt or NSAID) No   CHA2DS2-VASc Score = 8  The patient's score is based upon: CHF History: 1 HTN History: 1 Diabetes History: 0 Stroke History: 2 Vascular Disease History: 1 Age Score: 2 Gender Score: 1  I would like to wait 3  months before implanting her Watchman to allow the GI team to complete her evaluation (capsule endoscopy pending). This will also allow her to demonstrate stable Hgb prior to the implant.    #Hypertension Above goal today.  Recommend checking blood pressures 1-2 times per week at home and recording the values.  Recommend bringing these recordings to the primary care physician. She tells me her BP have been controlled at home. She should continue checking her BP at home and let her PCP know if they are consistently elevated above 140 systolic.    Signed, Rossie Muskrat. Lalla Brothers, MD, [facility], [facility] 02/19/2023 8:08 PM    Electrophysiology [facility]

## 2023-02-20 ENCOUNTER — Inpatient Hospital Stay: Payer: Medicare Other | Attending: Hematology and Oncology

## 2023-02-20 DIAGNOSIS — K922 Gastrointestinal hemorrhage, unspecified: Secondary | ICD-10-CM | POA: Diagnosis not present

## 2023-02-20 DIAGNOSIS — D500 Iron deficiency anemia secondary to blood loss (chronic): Secondary | ICD-10-CM | POA: Diagnosis not present

## 2023-02-20 LAB — CBC WITH DIFFERENTIAL (CANCER CENTER ONLY)
Abs Immature Granulocytes: 0.02 10*3/uL (ref 0.00–0.07)
Basophils Absolute: 0 10*3/uL (ref 0.0–0.1)
Basophils Relative: 1 %
Eosinophils Absolute: 0.1 10*3/uL (ref 0.0–0.5)
Eosinophils Relative: 3 %
HCT: 42.3 % (ref 36.0–46.0)
Hemoglobin: 13.8 g/dL (ref 12.0–15.0)
Immature Granulocytes: 1 %
Lymphocytes Relative: 22 %
Lymphs Abs: 0.8 10*3/uL (ref 0.7–4.0)
MCH: 29.6 pg (ref 26.0–34.0)
MCHC: 32.6 g/dL (ref 30.0–36.0)
MCV: 90.8 fL (ref 80.0–100.0)
Monocytes Absolute: 0.3 10*3/uL (ref 0.1–1.0)
Monocytes Relative: 8 %
Neutro Abs: 2.5 10*3/uL (ref 1.7–7.7)
Neutrophils Relative %: 65 %
Platelet Count: 149 10*3/uL — ABNORMAL LOW (ref 150–400)
RBC: 4.66 MIL/uL (ref 3.87–5.11)
RDW: 15.8 % — ABNORMAL HIGH (ref 11.5–15.5)
WBC Count: 3.7 10*3/uL — ABNORMAL LOW (ref 4.0–10.5)
nRBC: 0 % (ref 0.0–0.2)

## 2023-02-20 LAB — CMP (CANCER CENTER ONLY)
ALT: 9 U/L (ref 0–44)
AST: 16 U/L (ref 15–41)
Albumin: 4 g/dL (ref 3.5–5.0)
Alkaline Phosphatase: 63 U/L (ref 38–126)
Anion gap: 5 (ref 5–15)
BUN: 19 mg/dL (ref 8–23)
CO2: 31 mmol/L (ref 22–32)
Calcium: 9.7 mg/dL (ref 8.9–10.3)
Chloride: 103 mmol/L (ref 98–111)
Creatinine: 0.65 mg/dL (ref 0.44–1.00)
GFR, Estimated: >60 mL/min (ref >60)
Glucose, Bld: 89 mg/dL (ref 70–99)
Potassium: 3.7 mmol/L (ref 3.5–5.1)
Sodium: 139 mmol/L (ref 135–145)
Total Bilirubin: 0.4 mg/dL (ref <1.2)
Total Protein: 6.8 g/dL (ref 6.5–8.1)

## 2023-02-20 LAB — IRON AND IRON BINDING CAPACITY (CC-WL,HP ONLY)
Iron: 66 ug/dL (ref 28–170)
Saturation Ratios: 20 % (ref 10.4–31.8)
TIBC: 337 ug/dL (ref 250–450)
UIBC: 271 ug/dL (ref 148–442)

## 2023-02-20 LAB — RETIC PANEL
Immature Retic Fract: 1.3 % — ABNORMAL LOW (ref 2.3–15.9)
RBC.: 4.66 MIL/uL (ref 3.87–5.11)
Retic Ct Pct: <0.4 % — ABNORMAL LOW (ref 0.4–3.1)
Reticulocyte Hemoglobin: 35.1 pg (ref >27.9)

## 2023-02-20 LAB — FERRITIN: Ferritin: 54 ng/mL (ref 11–307)

## 2023-02-26 NOTE — Progress Notes (Unsigned)
[facility] [phone]   [phone]  PROGRESS NOTE  Patient Care Team: Geoffry Paradise, MD as PCP - General (Internal Medicine) Lanier Prude, MD as PCP - Electrophysiology (Cardiology) Kathleene Hazel, MD as Consulting Physician (Cardiology)  Hematological/Oncological History # Iron Deficiency Anemia 2/2 to GI Bleeding 10/24/2022: Upper GI endoscopy showed 3 angiodysplastic lesions with no bleeding. Treated with argon plasma. Erythematous mucosa noted in stomach lining. Biopsy showed no H. Pylori but did not reactive gastropathy.  12/13/2022: WBC 3.9, Hgb 7.7, MCV 84.5, Plt 241 12/26/2022: establish care with Dr. Leonides Schanz   Interval History:  [name] 82 y.o. female with medical history significant for IDA from GI bleeding who presents for a follow up visit. The patient's last visit was on 12/26/2022. In the interim since the last visit she received monoferric 1000 mg IV x 1 dose.   On exam [name] reports she has been well overall in the [place] her last visit.  She received the IV Monoferric in October and unfortunate developed a little spots and itching after the infusion.  She received Benadryl and Pepcid after which time it cleared.  She reports that she noticed a big boost in her energy and reports her energy is now about a 10 out of 10 after receiving the infusion.  She is not having any shortness of breath, lightheadedness, dizziness.  She notes that she continues to faithfully take her iron pill and is not causing any stomach upset, though it is causing dark stools.  She is not seeing any overt signs of bleeding anywhere such as nosebleeds, gum bleeding, or blood in the urine/stool.  Overall she feels well.  A full 10 point ROS is otherwise negative.  MEDICAL HISTORY:  Past Medical History:  Diagnosis Date   Acute inferolateral myocardial infarction (HCC) 03/24/2013   Acute MI, inferoposterior wall, initial episode of  care (HCC) 03/28/2013   Acute myocardial infarction of other inferior wall, initial episode of care    Amnesia, global, transient    Arthritis    needs left knee replacement   Blood transfusion without reported diagnosis    CAD (coronary artery disease)    a. 03/2013 Infpost STEMI/PCI: LM nl, LAD min irregs, LCX 100 (3.0x12 Vision BMS), RCA  mild ostial dzs, EF 55%.   Essential hypertension, benign    Fibroids    a. uterine - spotting noted since 03/11/2013.   GERD 11/19/2007   Qualifier: Diagnosis of  By: Marina Goodell MD, Wilhemina Bonito    GERD (gastroesophageal reflux disease)    Hearing loss    a. s/p cochlear implant on right, hearing aid on left.   Hernia of anterior abdominal wall    History of cardiovascular stress test    Lexiscan Myoview 8/16:  EF 58%, inferolateral scar, no ischemia; Low Risk   Hyperlipidemia    Hypothyroidism    Ischemic cardiomyopathy    Echo 7/16:  Septal and posterior lateral HK, EF 45-50%, mild LAE   IV Contrast Allergy    Near syncope 06/26/2014   Obesity    Old MI (myocardial infarction) 03/03/2014   Retrograde amnesia 06/26/2014   Rosacea    Ventricular tachycardia (HCC)    a. Immediate post pci/MI - no complications, on BB.    SURGICAL HISTORY: Past Surgical History:  Procedure Laterality Date   BIOPSY  10/24/2022   Procedure: BIOPSY;  Surgeon: Mansouraty, Netty Starring., MD;  Location: [facility];  Service: Gastroenterology;;   CERVICAL POLYPECTOMY  2010   CHOLECYSTECTOMY     COCHLEAR IMPLANT     ESOPHAGOGASTRODUODENOSCOPY (EGD) WITH PROPOFOL N/A 10/24/2022   Procedure: ESOPHAGOGASTRODUODENOSCOPY (EGD) WITH PROPOFOL;  Surgeon: Meridee Score Netty Starring., MD;  Location: [facility];  Service: Gastroenterology;  Laterality: N/A;   GASTRIC VARICES BANDING  10/24/2022   Procedure: GASTRIC VARICES BANDING;  Surgeon: Meridee Score Netty Starring., MD;  Location: [facility];  Service: Gastroenterology;;   HOT HEMOSTASIS N/A 10/24/2022   Procedure: HOT HEMOSTASIS  (ARGON PLASMA COAGULATION/BICAP);  Surgeon: Lemar Lofty., MD;  Location: [facility];  Service: Gastroenterology;  Laterality: N/A;   LEFT HEART CATHETERIZATION WITH CORONARY ANGIOGRAM N/A 03/25/2013   Procedure: LEFT HEART CATHETERIZATION WITH CORONARY ANGIOGRAM;  Surgeon: Kathleene Hazel, MD;  Location: [facility] C[facility] LAB;  Service: Cardiovascular;  Laterality: N/A;   ROBOTIC ASSISTED TOTAL HYSTERECTOMY WITH BILATERAL SALPINGO OOPHERECTOMY Bilateral 08/16/2014   Procedure: ROBOTIC ASSISTED TOTAL HYSTERECTOMY WITH BILATERAL SALPINGO OOPHORECTOMY ;  Surgeon: Adolphus Birchwood, MD;  Location: [facility];  Service: Gynecology;  Laterality: Bilateral;   UPPER GASTROINTESTINAL ENDOSCOPY     uterine polyp     had D and C done to remove the polyp    SOCIAL HISTORY: Social History   Socioeconomic History   Marital status: Divorced    Spouse name: Not on file   Number of children: Not on file   Years of education: Not on file   Highest education level: Not on file  Occupational History   Occupation: retired from [employer], industrial/product work  Tobacco Use   Smoking status: Former    Current packs/day: 0.00    Average packs/day: 0.2 packs/day for 2.0 years (0.4 ttl pk-yrs)    Types: Cigarettes    Start date: 03/18/1966    Quit date: 03/18/1968    Years since quitting: 54.9   Smokeless tobacco: Never  Vaping Use   Vaping status: Never Used  Substance and Sexual Activity   Alcohol use: No   Drug use: No   Sexual activity: Not Currently  Other Topics Concern   Not on file  Social History Narrative   Lives in [place] by herself.  Sister is nearby and is Healthcare POA.   Social Drivers of Corporate investment banker Strain: Not on file  Food Insecurity: No Food Insecurity (12/26/2022)   Hunger Vital Sign    Worried About Running Out of Food in the Last Year: Never true    Ran Out of Food in the Last Year: Never true  Transportation Needs: No Transportation Needs (12/26/2022)   PRAPARE - Therapist, art (Medical): No    Lack of Transportation (Non-Medical): No  Physical Activity: Not on file  Stress: Not on file  Social Connections: Not on file  Intimate Partner Violence: Not At Risk (12/26/2022)   Humiliation, Afraid, Rape, and Kick questionnaire    Fear of Current or Ex-Partner: No    Emotionally Abused: No    Physically Abused: No    Sexually Abused: No    FAMILY HISTORY: Family History  Problem Relation Age of Onset   Heart failure Mother        died in her 56's.   Cancer Mother    Diabetes Mother    Heart disease Mother    Hypertension Mother    Heart attack Mother    Other Father        died in WWII   Hyperlipidemia Sister    Diabetes Sister    Heart attack Maternal Grandmother  Colon cancer Cousin    Esophageal cancer Neg Hx    Stomach cancer Neg Hx    Stroke Neg Hx    Rectal cancer Neg Hx     ALLERGIES:  is allergic to bee venom, contrast media [iodinated contrast media], fish allergy, and iohexol.  MEDICATIONS:  Current Outpatient Medications  Medication Sig Dispense Refill   Calcium Carb-Cholecalciferol (CALCIUM + D3) 600-200 MG-UNIT TABS Take 1 tablet by mouth every morning.      Cyanocobalamin (VITAMIN B 12 PO) Take 1,000 tablets by mouth daily.     FERREX 150 150 MG capsule Take 150 mg by mouth 2 (two) times daily.     furosemide (LASIX) 20 MG tablet Take 1 tablet (20 mg total) by mouth daily. 90 tablet 1   levothyroxine (SYNTHROID, LEVOTHROID) 100 MCG tablet Take 100 mcg by mouth daily before breakfast.     lisinopril (ZESTRIL) 40 MG tablet TAKE ONE TABLET BY MOUTH DAILY 90 tablet 3   metoprolol succinate (TOPROL-XL) 50 MG 24 hr tablet TAKE 1 & 1/2 TABLETS BY MOUTH DAILY take WITH OR immediately following a meal (Patient taking differently: Pt taking 1 pill a day) 135 tablet 3   metroNIDAZOLE (METROGEL) 0.75 % gel Apply 1 application topically daily. Patient places on her cheeks and nose, only about once or twice a week  depending on the climate.     nitroGLYCERIN (NITROSTAT) 0.4 MG SL tablet DISSOLVE ONE TABLET UNDER THE TONGUE AS NEEDED FOR CHEST PAIN MAY REPEAT IN FIVE MINUTES FOR TWO DOSES 25 tablet 3   pantoprazole (PROTONIX) 40 MG tablet Take 1 tablet (40 mg total) by mouth 2 (two) times daily. Please call to schedule an overdue appointment with Dr. Eldridge Dace for refills, [phone], thank you. 1st attempt. 60 tablet 2   potassium chloride SA (KLOR-CON M) 20 MEQ tablet Take 1 tablet (20 mEq total) by mouth daily. 90 tablet 1   rivaroxaban (XARELTO) 20 MG TABS tablet Take 1 tablet (20 mg total) by mouth daily with supper.     Current Facility-Administered Medications  Medication Dose Route Frequency Provider Last Rate Last Admin   0.9 %  sodium chloride infusion  500 mL Intravenous Once Hilarie Fredrickson, MD        REVIEW OF SYSTEMS:   Constitutional: ( - ) fevers, ( - )  chills , ( - ) night sweats Eyes: ( - ) blurriness of vision, ( - ) double vision, ( - ) watery eyes Ears, nose, mouth, throat, and face: ( - ) mucositis, ( - ) sore throat Respiratory: ( - ) cough, ( - ) dyspnea, ( - ) wheezes Cardiovascular: ( - ) palpitation, ( - ) chest discomfort, ( - ) lower extremity swelling Gastrointestinal:  ( - ) nausea, ( - ) heartburn, ( - ) change in bowel habits Skin: ( - ) abnormal skin rashes Lymphatics: ( - ) new lymphadenopathy, ( - ) easy bruising Neurological: ( - ) numbness, ( - ) tingling, ( - ) new weaknesses Behavioral/Psych: ( - ) mood change, ( - ) new changes  All other systems were reviewed with the patient and are negative.  PHYSICAL EXAMINATION:  Vitals:   02/27/23 0906  BP: (!) 188/88  Pulse: 67  Resp: 13  Temp: 98.4 F (36.9 C)  SpO2: 99%   Filed Weights   02/27/23 0906  Weight: 190 lb 1.6 oz (86.2 kg)    GENERAL: Well-appearing elderly Caucasian female, alert, no distress and comfortable SKIN:  skin color, texture, turgor are normal, no rashes or significant lesions EYES:  conjunctiva are pink and non-injected, sclera clear LUNGS: clear to auscultation and percussion with normal breathing effort HEART: regular rate & rhythm and no murmurs and no lower extremity edema Musculoskeletal: no cyanosis of digits and no clubbing  PSYCH: alert & oriented x 3, fluent speech NEURO: no focal motor/sensory deficits  LABORATORY DATA:  I have reviewed the data as listed    Latest Ref Rng & Units 02/20/2023    9:31 AM 12/26/2022    3:44 PM 12/13/2022   12:50 PM  CBC  WBC 4.0 - 10.5 K/uL 3.7  4.5  3.9   Hemoglobin 12.0 - 15.0 g/dL 16.1  8.1  7.7 Repeated and verified X2.   Hematocrit 36.0 - 46.0 % 42.3  26.2  24.6 Repeated and verified X2.   Platelets 150 - 400 K/uL 149  191  241.0        Latest Ref Rng & Units 02/20/2023    9:31 AM 12/26/2022    3:44 PM 10/25/2022    5:06 AM  CMP  Glucose 70 - 99 mg/dL 89  92  95   BUN 8 - 23 mg/dL 19  22  11    Creatinine 0.44 - 1.00 mg/dL 0.96  0.45  4.09   Sodium 135 - 145 mmol/L 139  137  134   Potassium 3.5 - 5.1 mmol/L 3.7  3.7  3.5   Chloride 98 - 111 mmol/L 103  105  101   CO2 22 - 32 mmol/L 31  27  21    Calcium 8.9 - 10.3 mg/dL 9.7  9.1  8.5   Total Protein 6.5 - 8.1 g/dL 6.8  6.6    Total Bilirubin <1.2 mg/dL 0.4  0.3    Alkaline Phos 38 - 126 U/L 63  55    AST 15 - 41 U/L 16  13    ALT 0 - 44 U/L 9  7     RADIOGRAPHIC STUDIES: No results found.  ASSESSMENT & PLAN ZAILAH GOZMAN 81 y.o. female with medical history significant for CAD, GERD, HTN, HLD, hypothyroidism, and obesity present for evaluation of iron deficiency anemia 2/2 to GI bleeding.    After review of the labs, review of the records, and discussion with the patient the patients findings are most consistent with anemia secondary to bleeding AVMs.   # Iron Deficiency Anemia 2/2 to GI Bleeding -- Findings are consistent with iron deficiency anemia secondary to patient's AVMs --Encouraged her to follow-up with GI for continued evaluation of her GI  bleeding. Agree with capsule endoscopy if iron deficiency is persistent despite continued iron infusions.  --labs today show white blood cell 3.7, hemoglobin 13.8, MCV 90.8, platelets 149.  Ferritin 54. --Continue iron supplementation daily with a source of vitamin C --We will plan to proceed with IV iron therapy in order to help bolster the patient's blood counts if she has another drop in iron levels.  Patient will require premedication with Benadryl and Pepcid as she received Monoferric last time and got hives. --RTC every 4 weeks for labs and in 4 months for clinic visit.    No orders of the defined types were placed in this encounter.   All questions were answered. The patient knows to call the clinic with any problems, questions or concerns.  A total of more than 30 minutes were spent on this encounter with face-to-face time and non-face-to-face time, including preparing to see  the patient, ordering tests and/or medications, counseling the patient and coordination of care as outlined above.   Ulysees Barns, MD Department of Hematology/Oncology [facility] Phone: [phone] Pager: [phone] Email: [email or web address]  02/27/2023 9:37 AM

## 2023-02-27 ENCOUNTER — Inpatient Hospital Stay (HOSPITAL_BASED_OUTPATIENT_CLINIC_OR_DEPARTMENT_OTHER): Payer: Medicare Other | Admitting: Hematology and Oncology

## 2023-02-27 VITALS — BP 188/88 | HR 67 | Temp 98.4°F | Resp 13 | Wt 190.1 lb

## 2023-02-27 DIAGNOSIS — D500 Iron deficiency anemia secondary to blood loss (chronic): Secondary | ICD-10-CM

## 2023-02-27 DIAGNOSIS — K922 Gastrointestinal hemorrhage, unspecified: Secondary | ICD-10-CM | POA: Diagnosis not present

## 2023-03-03 ENCOUNTER — Telehealth: Payer: Self-pay | Admitting: Internal Medicine

## 2023-03-03 ENCOUNTER — Telehealth: Payer: Self-pay

## 2023-03-03 DIAGNOSIS — I480 Paroxysmal atrial fibrillation: Secondary | ICD-10-CM

## 2023-03-03 DIAGNOSIS — I4892 Unspecified atrial flutter: Secondary | ICD-10-CM

## 2023-03-03 NOTE — Telephone Encounter (Signed)
Per Dr. Lalla Brothers, will schedule the patient for LAAO in at least 3 months to ensure CBC stability and to ensure there is time for GI work up.  The patient reported she will call Dr. Lamar Sprinkles office and schedule an appointment as she has nothing scheduled for a visit for the capsule endoscopy.  She also stated her sister, who will be her transportation for LAAO, is having surgery on her knee in February so LAAO will likely not take place until later March or April 2025. She was grateful for call and will contact the Watchman team once GI appointment is scheduled.

## 2023-03-03 NOTE — Telephone Encounter (Signed)
Left message for pt to call back  °

## 2023-03-03 NOTE — Telephone Encounter (Signed)
Inbound call from patient, would like to schedule capsule endoscopy. Per Dr. Lamar Sprinkles last note, he stated he did not advise on scheduling it. No recall in place. Please advise.

## 2023-03-04 NOTE — Telephone Encounter (Signed)
Pt states she had IV Iron with Dr. Leonides Schanz at the cancer center and her Hgb is back up. He mentioned to her she should be ok to proceed with a capsule endo now. She is supposed to be having a watchman procedure also. Per last OV note it mentioned not doing VCE but let her know we will check with Dr. Marina Goodell to see if he feels she is needing one now. Please advise.

## 2023-03-05 DIAGNOSIS — I4892 Unspecified atrial flutter: Secondary | ICD-10-CM | POA: Diagnosis not present

## 2023-03-05 DIAGNOSIS — K219 Gastro-esophageal reflux disease without esophagitis: Secondary | ICD-10-CM | POA: Diagnosis not present

## 2023-03-05 DIAGNOSIS — E785 Hyperlipidemia, unspecified: Secondary | ICD-10-CM | POA: Diagnosis not present

## 2023-03-05 DIAGNOSIS — E669 Obesity, unspecified: Secondary | ICD-10-CM | POA: Diagnosis not present

## 2023-03-05 DIAGNOSIS — I509 Heart failure, unspecified: Secondary | ICD-10-CM | POA: Diagnosis not present

## 2023-03-05 DIAGNOSIS — I2510 Atherosclerotic heart disease of native coronary artery without angina pectoris: Secondary | ICD-10-CM | POA: Diagnosis not present

## 2023-03-05 DIAGNOSIS — E611 Iron deficiency: Secondary | ICD-10-CM | POA: Diagnosis not present

## 2023-03-05 DIAGNOSIS — E039 Hypothyroidism, unspecified: Secondary | ICD-10-CM | POA: Diagnosis not present

## 2023-03-05 DIAGNOSIS — K922 Gastrointestinal hemorrhage, unspecified: Secondary | ICD-10-CM | POA: Diagnosis not present

## 2023-03-05 DIAGNOSIS — I110 Hypertensive heart disease with heart failure: Secondary | ICD-10-CM | POA: Diagnosis not present

## 2023-03-05 DIAGNOSIS — D649 Anemia, unspecified: Secondary | ICD-10-CM | POA: Diagnosis not present

## 2023-03-05 NOTE — Telephone Encounter (Signed)
Spoke with pt and she is aware of recommendations per Dr. Marina Goodell.

## 2023-04-02 ENCOUNTER — Inpatient Hospital Stay: Payer: Medicare Other | Attending: Hematology and Oncology

## 2023-04-02 ENCOUNTER — Other Ambulatory Visit: Payer: Self-pay | Admitting: Hematology and Oncology

## 2023-04-02 DIAGNOSIS — D500 Iron deficiency anemia secondary to blood loss (chronic): Secondary | ICD-10-CM

## 2023-04-02 DIAGNOSIS — K922 Gastrointestinal hemorrhage, unspecified: Secondary | ICD-10-CM | POA: Insufficient documentation

## 2023-04-02 LAB — CBC WITH DIFFERENTIAL (CANCER CENTER ONLY)
Abs Immature Granulocytes: 0.01 10*3/uL (ref 0.00–0.07)
Basophils Absolute: 0.1 10*3/uL (ref 0.0–0.1)
Basophils Relative: 1 %
Eosinophils Absolute: 0.2 10*3/uL (ref 0.0–0.5)
Eosinophils Relative: 4 %
HCT: 42.7 % (ref 36.0–46.0)
Hemoglobin: 14.1 g/dL (ref 12.0–15.0)
Immature Granulocytes: 0 %
Lymphocytes Relative: 18 %
Lymphs Abs: 0.9 10*3/uL (ref 0.7–4.0)
MCH: 29.5 pg (ref 26.0–34.0)
MCHC: 33 g/dL (ref 30.0–36.0)
MCV: 89.3 fL (ref 80.0–100.0)
Monocytes Absolute: 0.3 10*3/uL (ref 0.1–1.0)
Monocytes Relative: 6 %
Neutro Abs: 3.6 10*3/uL (ref 1.7–7.7)
Neutrophils Relative %: 71 %
Platelet Count: 189 10*3/uL (ref 150–400)
RBC: 4.78 MIL/uL (ref 3.87–5.11)
RDW: 14.6 % (ref 11.5–15.5)
WBC Count: 5 10*3/uL (ref 4.0–10.5)
nRBC: 0 % (ref 0.0–0.2)

## 2023-04-02 LAB — CMP (CANCER CENTER ONLY)
ALT: 8 U/L (ref 0–44)
AST: 15 U/L (ref 15–41)
Albumin: 4 g/dL (ref 3.5–5.0)
Alkaline Phosphatase: 60 U/L (ref 38–126)
Anion gap: 4 — ABNORMAL LOW (ref 5–15)
BUN: 19 mg/dL (ref 8–23)
CO2: 30 mmol/L (ref 22–32)
Calcium: 9.5 mg/dL (ref 8.9–10.3)
Chloride: 104 mmol/L (ref 98–111)
Creatinine: 0.69 mg/dL (ref 0.44–1.00)
GFR, Estimated: >60 mL/min (ref >60)
Glucose, Bld: 95 mg/dL (ref 70–99)
Potassium: 4.1 mmol/L (ref 3.5–5.1)
Sodium: 138 mmol/L (ref 135–145)
Total Bilirubin: 0.5 mg/dL (ref 0.0–1.2)
Total Protein: 7.2 g/dL (ref 6.5–8.1)

## 2023-04-02 LAB — LACTATE DEHYDROGENASE: LDH: 165 U/L (ref 98–192)

## 2023-04-04 LAB — KAPPA/LAMBDA LIGHT CHAINS
Kappa free light chain: 36 mg/L — ABNORMAL HIGH (ref 3.3–19.4)
Kappa, lambda light chain ratio: 2.5 — ABNORMAL HIGH (ref 0.26–1.65)
Lambda free light chains: 14.4 mg/L (ref 5.7–26.3)

## 2023-04-10 LAB — MULTIPLE MYELOMA PANEL, SERUM
Albumin SerPl Elph-Mcnc: 3.4 g/dL (ref 2.9–4.4)
Albumin/Glob SerPl: 1.1 (ref 0.7–1.7)
Alpha 1: 0.3 g/dL (ref 0.0–0.4)
Alpha2 Glob SerPl Elph-Mcnc: 0.7 g/dL (ref 0.4–1.0)
B-Globulin SerPl Elph-Mcnc: 1.4 g/dL — ABNORMAL HIGH (ref 0.7–1.3)
Gamma Glob SerPl Elph-Mcnc: 0.8 g/dL (ref 0.4–1.8)
Globulin, Total: 3.3 g/dL (ref 2.2–3.9)
IgA: 595 mg/dL — ABNORMAL HIGH (ref 64–422)
IgG (Immunoglobin G), Serum: 925 mg/dL (ref 586–1602)
IgM (Immunoglobulin M), Srm: 116 mg/dL (ref 26–217)
Total Protein ELP: 6.7 g/dL (ref 6.0–8.5)

## 2023-04-22 ENCOUNTER — Other Ambulatory Visit: Payer: Self-pay | Admitting: Cardiovascular Disease

## 2023-04-22 DIAGNOSIS — I25118 Atherosclerotic heart disease of native coronary artery with other forms of angina pectoris: Secondary | ICD-10-CM

## 2023-05-01 ENCOUNTER — Other Ambulatory Visit: Payer: Self-pay | Admitting: Hematology and Oncology

## 2023-05-01 ENCOUNTER — Inpatient Hospital Stay: Payer: Medicare Other | Attending: Hematology and Oncology

## 2023-05-01 DIAGNOSIS — D500 Iron deficiency anemia secondary to blood loss (chronic): Secondary | ICD-10-CM

## 2023-05-01 DIAGNOSIS — K922 Gastrointestinal hemorrhage, unspecified: Secondary | ICD-10-CM | POA: Diagnosis not present

## 2023-05-01 LAB — CBC WITH DIFFERENTIAL (CANCER CENTER ONLY)
Abs Immature Granulocytes: 0.03 10*3/uL (ref 0.00–0.07)
Basophils Absolute: 0 10*3/uL (ref 0.0–0.1)
Basophils Relative: 0 %
Eosinophils Absolute: 0.3 10*3/uL (ref 0.0–0.5)
Eosinophils Relative: 3 %
HCT: 42.3 % (ref 36.0–46.0)
Hemoglobin: 13.9 g/dL (ref 12.0–15.0)
Immature Granulocytes: 0 %
Lymphocytes Relative: 9 %
Lymphs Abs: 0.7 10*3/uL (ref 0.7–4.0)
MCH: 30.7 pg (ref 26.0–34.0)
MCHC: 32.9 g/dL (ref 30.0–36.0)
MCV: 93.4 fL (ref 80.0–100.0)
Monocytes Absolute: 0.5 10*3/uL (ref 0.1–1.0)
Monocytes Relative: 6 %
Neutro Abs: 6.6 10*3/uL (ref 1.7–7.7)
Neutrophils Relative %: 82 %
Platelet Count: 181 10*3/uL (ref 150–400)
RBC: 4.53 MIL/uL (ref 3.87–5.11)
RDW: 13.7 % (ref 11.5–15.5)
WBC Count: 8.1 10*3/uL (ref 4.0–10.5)
nRBC: 0 % (ref 0.0–0.2)

## 2023-05-01 LAB — IRON AND IRON BINDING CAPACITY (CC-WL,HP ONLY)
Iron: 104 ug/dL (ref 28–170)
Saturation Ratios: 30 % (ref 10.4–31.8)
TIBC: 346 ug/dL (ref 250–450)
UIBC: 242 ug/dL (ref 148–442)

## 2023-05-01 LAB — CMP (CANCER CENTER ONLY)
ALT: 8 U/L (ref 0–44)
AST: 14 U/L — ABNORMAL LOW (ref 15–41)
Albumin: 4 g/dL (ref 3.5–5.0)
Alkaline Phosphatase: 64 U/L (ref 38–126)
Anion gap: 4 — ABNORMAL LOW (ref 5–15)
BUN: 20 mg/dL (ref 8–23)
CO2: 31 mmol/L (ref 22–32)
Calcium: 9.3 mg/dL (ref 8.9–10.3)
Chloride: 103 mmol/L (ref 98–111)
Creatinine: 0.72 mg/dL (ref 0.44–1.00)
GFR, Estimated: >60 mL/min (ref >60)
Glucose, Bld: 101 mg/dL — ABNORMAL HIGH (ref 70–99)
Potassium: 3.8 mmol/L (ref 3.5–5.1)
Sodium: 138 mmol/L (ref 135–145)
Total Bilirubin: 1 mg/dL (ref 0.0–1.2)
Total Protein: 7 g/dL (ref 6.5–8.1)

## 2023-05-01 LAB — RETIC PANEL
Immature Retic Fract: 4.4 % (ref 2.3–15.9)
RBC.: 4.43 MIL/uL (ref 3.87–5.11)
Retic Count, Absolute: 46.1 10*3/uL (ref 19.0–186.0)
Retic Ct Pct: 1 % (ref 0.4–3.1)
Reticulocyte Hemoglobin: 35 pg (ref >27.9)

## 2023-05-01 LAB — FERRITIN: Ferritin: 79 ng/mL (ref 11–307)

## 2023-05-08 NOTE — Telephone Encounter (Signed)
Spoke with the patient at length today.  Per Dr. Lovena Neighbours consult note, "I would like to wait 3 months before implanting her Watchman to allow the GI team to complete her evaluation (capsule endoscopy pending). This will also allow her to demonstrate stable Hgb prior to the implant." She reported she spoke with Dr. Marina Goodell and he told her no further testing was indicated at this time. She nor Dr. Lamar Sprinkles office updated Cardiology of this (see 03/02/24 phone note).  Tentatively scheduled her pre-LAAO CT for 3/18. Informed the patient she will be called to review instructions once Dr. Lalla Brothers confirms plan.  Her most recent hemoglobin was 13.9 and she is scheduled for repeat labs 3/12.  To Dr. Lalla Brothers for OK to proceed with LAAO.

## 2023-05-28 ENCOUNTER — Other Ambulatory Visit: Payer: Self-pay | Admitting: Hematology and Oncology

## 2023-05-28 ENCOUNTER — Inpatient Hospital Stay: Payer: Medicare Other | Attending: Hematology and Oncology

## 2023-05-28 DIAGNOSIS — D500 Iron deficiency anemia secondary to blood loss (chronic): Secondary | ICD-10-CM

## 2023-05-28 DIAGNOSIS — K922 Gastrointestinal hemorrhage, unspecified: Secondary | ICD-10-CM | POA: Insufficient documentation

## 2023-05-28 LAB — CMP (CANCER CENTER ONLY)
ALT: 9 U/L (ref 0–44)
AST: 16 U/L (ref 15–41)
Albumin: 4.1 g/dL (ref 3.5–5.0)
Alkaline Phosphatase: 62 U/L (ref 38–126)
Anion gap: 6 (ref 5–15)
BUN: 17 mg/dL (ref 8–23)
CO2: 28 mmol/L (ref 22–32)
Calcium: 9.1 mg/dL (ref 8.9–10.3)
Chloride: 105 mmol/L (ref 98–111)
Creatinine: 0.72 mg/dL (ref 0.44–1.00)
GFR, Estimated: >60 mL/min (ref >60)
Glucose, Bld: 106 mg/dL — ABNORMAL HIGH (ref 70–99)
Potassium: 4.2 mmol/L (ref 3.5–5.1)
Sodium: 139 mmol/L (ref 135–145)
Total Bilirubin: 0.5 mg/dL (ref 0.0–1.2)
Total Protein: 7 g/dL (ref 6.5–8.1)

## 2023-05-28 LAB — CBC WITH DIFFERENTIAL (CANCER CENTER ONLY)
Abs Immature Granulocytes: 0.01 10*3/uL (ref 0.00–0.07)
Basophils Absolute: 0.1 10*3/uL (ref 0.0–0.1)
Basophils Relative: 1 %
Eosinophils Absolute: 0.2 10*3/uL (ref 0.0–0.5)
Eosinophils Relative: 5 %
HCT: 40.2 % (ref 36.0–46.0)
Hemoglobin: 13.9 g/dL (ref 12.0–15.0)
Immature Granulocytes: 0 %
Lymphocytes Relative: 25 %
Lymphs Abs: 1 10*3/uL (ref 0.7–4.0)
MCH: 31.4 pg (ref 26.0–34.0)
MCHC: 34.6 g/dL (ref 30.0–36.0)
MCV: 90.7 fL (ref 80.0–100.0)
Monocytes Absolute: 0.3 10*3/uL (ref 0.1–1.0)
Monocytes Relative: 7 %
Neutro Abs: 2.5 10*3/uL (ref 1.7–7.7)
Neutrophils Relative %: 62 %
Platelet Count: 196 10*3/uL (ref 150–400)
RBC: 4.43 MIL/uL (ref 3.87–5.11)
RDW: 12.9 % (ref 11.5–15.5)
WBC Count: 4.1 10*3/uL (ref 4.0–10.5)
nRBC: 0 % (ref 0.0–0.2)

## 2023-05-28 LAB — IRON AND IRON BINDING CAPACITY (CC-WL,HP ONLY)
Iron: 71 ug/dL (ref 28–170)
Saturation Ratios: 21 % (ref 10.4–31.8)
TIBC: 336 ug/dL (ref 250–450)
UIBC: 265 ug/dL (ref 148–442)

## 2023-05-28 LAB — RETIC PANEL
Immature Retic Fract: 3.6 % (ref 2.3–15.9)
RBC.: 4.47 MIL/uL (ref 3.87–5.11)
Retic Count, Absolute: 63 10*3/uL (ref 19.0–186.0)
Retic Ct Pct: 1.4 % (ref 0.4–3.1)
Reticulocyte Hemoglobin: 34.1 pg (ref >27.9)

## 2023-05-28 LAB — FERRITIN: Ferritin: 63 ng/mL (ref 11–307)

## 2023-05-29 ENCOUNTER — Other Ambulatory Visit (HOSPITAL_COMMUNITY): Payer: Self-pay

## 2023-05-29 MED ORDER — PREDNISONE 50 MG PO TABS
ORAL_TABLET | ORAL | 0 refills | Status: DC
  Filled 2023-05-29: qty 3, 1d supply, fill #0

## 2023-05-29 MED ORDER — PREDNISONE 50 MG PO TABS: ORAL_TABLET | ORAL | 0 refills | Status: DC

## 2023-05-29 NOTE — Addendum Note (Signed)
 Addended by: Gunnar Fusi A on: 05/29/2023 01:55 PM   Modules accepted: Orders

## 2023-05-29 NOTE — Telephone Encounter (Signed)
 Discussed with Dr. Lalla Brothers. OK to proceed with CT as scheduled.   Called patient to confirm pre-meds for contrast allergy and confirm CT instructions. Left message to call back.

## 2023-05-29 NOTE — Telephone Encounter (Signed)
 Reviewed instructions with patient. She was grateful for call and agreed with plan.

## 2023-05-29 NOTE — Addendum Note (Signed)
 Addended by: Gunnar Fusi A on: 05/29/2023 02:46 PM   Modules accepted: Orders

## 2023-06-02 ENCOUNTER — Telehealth (HOSPITAL_COMMUNITY): Payer: Self-pay | Admitting: *Deleted

## 2023-06-02 NOTE — Telephone Encounter (Signed)
 Reaching out to patient to offer assistance regarding upcoming cardiac imaging study; pt verbalizes understanding of appt date/time, parking situation and where to check in, pre-test NPO status and medications ordered, and verified current allergies; name and call back number provided for further questions should they arise  Larey Brick RN Navigator Cardiac Imaging [facility] [phone] office [phone] cell  Patient verbalized understanding of 13 hour prep.

## 2023-06-03 ENCOUNTER — Ambulatory Visit (HOSPITAL_COMMUNITY)
Admission: RE | Admit: 2023-06-03 | Discharge: 2023-06-03 | Disposition: A | Discharge status: Home / Self Care | Payer: Medicare Other | Source: Ambulatory Visit | Attending: *Deleted | Admitting: *Deleted

## 2023-06-03 DIAGNOSIS — I252 Old myocardial infarction: Secondary | ICD-10-CM | POA: Diagnosis not present

## 2023-06-03 DIAGNOSIS — I4892 Unspecified atrial flutter: Secondary | ICD-10-CM | POA: Insufficient documentation

## 2023-06-03 DIAGNOSIS — Q2112 Patent foramen ovale: Secondary | ICD-10-CM | POA: Insufficient documentation

## 2023-06-03 DIAGNOSIS — I480 Paroxysmal atrial fibrillation: Secondary | ICD-10-CM | POA: Diagnosis not present

## 2023-06-03 DIAGNOSIS — Z01810 Encounter for preprocedural cardiovascular examination: Secondary | ICD-10-CM | POA: Insufficient documentation

## 2023-06-03 DIAGNOSIS — Q208 Other congenital malformations of cardiac chambers and connections: Secondary | ICD-10-CM | POA: Diagnosis not present

## 2023-06-03 DIAGNOSIS — Z01818 Encounter for other preprocedural examination: Secondary | ICD-10-CM | POA: Diagnosis present

## 2023-06-03 MED ORDER — IOHEXOL 350 MG/ML SOLN
100.0000 mL | Freq: Once | INTRAVENOUS | Status: AC | PRN
  Administered 2023-06-03: 100 mL via INTRAVENOUS

## 2023-06-08 ENCOUNTER — Encounter: Payer: Self-pay | Admitting: Cardiology

## 2023-06-09 ENCOUNTER — Ambulatory Visit (HOSPITAL_COMMUNITY)
Admission: EM | Admit: 2023-06-09 | Discharge: 2023-06-09 | Disposition: A | Discharge status: Home / Self Care | Attending: Internal Medicine | Admitting: Internal Medicine

## 2023-06-09 ENCOUNTER — Encounter (HOSPITAL_COMMUNITY): Payer: Self-pay

## 2023-06-09 DIAGNOSIS — L239 Allergic contact dermatitis, unspecified cause: Secondary | ICD-10-CM | POA: Diagnosis not present

## 2023-06-09 DIAGNOSIS — T508X5A Adverse effect of diagnostic agents, initial encounter: Secondary | ICD-10-CM | POA: Diagnosis not present

## 2023-06-09 DIAGNOSIS — I10 Essential (primary) hypertension: Secondary | ICD-10-CM

## 2023-06-09 MED ORDER — PREDNISONE 20 MG PO TABS: 40.0000 mg | ORAL_TABLET | Freq: Every day | ORAL | 0 refills | Status: AC

## 2023-06-09 MED ORDER — CETIRIZINE HCL 5 MG PO TABS: 5.0000 mg | ORAL_TABLET | Freq: Every day | ORAL | 0 refills | Status: AC

## 2023-06-09 NOTE — ED Provider Notes (Addendum)
 [facility]    CSN: [id number] Arrival date & time: 06/09/23  1532      History   Chief Complaint Chief Complaint  Patient presents with   Allergic Reaction    HPI [name] is a 83 y.o. female.   [name] is a 83 y.o. female presenting for chief complaint of Allergic Reaction.  Patient received a CT scan with IV contrast as part of a workup for a watchman's procedure on Tuesday, June 03, 2023.  She has a known IV contrast dye allergy and was premedicated with Benadryl and prednisone.  She took premeds as prescribed prior to CT scan.  She developed rash to the neck and chest the next day on the 19th. Rash has since spread to the trunk, arms, and legs. Rash is red, blotchy, itchy, and warm. States rash is similar to previous reaction to CT contrast. She called the RN line who recommended use of benadryl.  She has been taking Benadryl intermittently for the last 5 to 6 days and states rash has improved slightly, however remains irritating and itchy.  Denies fever, chills, nausea, vomiting, body aches, dizziness, shortness of breath, chest pain, throat closure sensation.  Denies use of any other new medications, personal hygiene products, or exposure to other known allergens.  Using cortisone 10 cream to the rash without much relief of itching.  Blood pressure notably elevated in clinic at 193/86.  BP on recheck 194/87.  Patient denies headache, dizziness, chest pain, shortness of breath, leg swelling, orthopnea, and vision changes.  Reports history of whitecoat syndrome and states her blood pressure at home is usually in the 120s over 60s over the 140s over 70s. History of hypertension. Takes lisinopril 40mg  daily.     Allergic Reaction   Past Medical History:  Diagnosis Date   Acute inferolateral myocardial infarction (HCC) 03/24/2013   Acute MI, inferoposterior wall, initial episode of care (HCC) 03/28/2013   Acute myocardial infarction of other inferior  wall, initial episode of care    Amnesia, global, transient    Arthritis    needs left knee replacement   Blood transfusion without reported diagnosis    CAD (coronary artery disease)    a. 03/2013 Infpost STEMI/PCI: LM nl, LAD min irregs, LCX 100 (3.0x12 Vision BMS), RCA  mild ostial dzs, EF 55%.   Essential hypertension, benign    Fibroids    a. uterine - spotting noted since 03/11/2013.   GERD 11/19/2007   Qualifier: Diagnosis of  By: Marina Goodell MD, Wilhemina Bonito    GERD (gastroesophageal reflux disease)    Hearing loss    a. s/p cochlear implant on right, hearing aid on left.   Hernia of anterior abdominal wall    History of cardiovascular stress test    Lexiscan Myoview 8/16:  EF 58%, inferolateral scar, no ischemia; Low Risk   Hyperlipidemia    Hypothyroidism    Ischemic cardiomyopathy    Echo 7/16:  Septal and posterior lateral HK, EF 45-50%, mild LAE   IV Contrast Allergy    Near syncope 06/26/2014   Obesity    Old MI (myocardial infarction) 03/03/2014   Retrograde amnesia 06/26/2014   Rosacea    Ventricular tachycardia (HCC)    a. Immediate post pci/MI - no complications, on BB.    Patient Active Problem List   Diagnosis Date Noted   Chronic anticoagulation 10/25/2022   Gastric AVM 10/25/2022   Gastric polyp 10/25/2022   Absolute anemia 10/25/2022  Anemia 10/25/2022   Acute on chronic anemia 10/22/2022   Iron deficiency anemia due to chronic blood loss 10/22/2022   H/O atrial flutter on Xarelto. 10/22/2022   Chronic diastolic CHF (congestive heart failure) preserved EF 60-65 (HCC) 10/22/2022   Hypertensive urgency    TIA (transient ischemic attack) 12/22/2019   Post-menopausal bleeding 08/17/2014   Postmenopausal bleeding 08/16/2014   Amnesia, global, transient    Vaginal bleeding 06/26/2014   Retrograde amnesia 06/26/2014   Acute post-hemorrhagic anemia 06/26/2014   Near syncope 06/26/2014   Old MI (myocardial infarction) 03/03/2014   DOE (dyspnea on exertion)  07/30/2013   Edema 04/27/2013   Acute MI, inferoposterior wall, initial episode of care (HCC) 03/28/2013   CAD (coronary artery disease) 03/28/2013   Hypothyroidism 03/28/2013   Hyperlipidemia 03/28/2013   IV Contrast Allergy    Rosacea    Obesity    Ventricular tachycardia (HCC)    Acute inferolateral myocardial infarction (HCC) 03/24/2013   Acute myocardial infarction of other inferior wall, initial episode of care    Essential hypertension    GERD 11/19/2007    Past Surgical History:  Procedure Laterality Date   BIOPSY  10/24/2022   Procedure: BIOPSY;  Surgeon: Lemar Lofty., MD;  Location: [facility];  Service: Gastroenterology;;   CERVICAL POLYPECTOMY     2010   CHOLECYSTECTOMY     COCHLEAR IMPLANT     ESOPHAGOGASTRODUODENOSCOPY (EGD) WITH PROPOFOL N/A 10/24/2022   Procedure: ESOPHAGOGASTRODUODENOSCOPY (EGD) WITH PROPOFOL;  Surgeon: Lemar Lofty., MD;  Location: [facility];  Service: Gastroenterology;  Laterality: N/A;   GASTRIC VARICES BANDING  10/24/2022   Procedure: GASTRIC VARICES BANDING;  Surgeon: Meridee Score Netty Starring., MD;  Location: [facility];  Service: Gastroenterology;;   HOT HEMOSTASIS N/A 10/24/2022   Procedure: HOT HEMOSTASIS (ARGON PLASMA COAGULATION/BICAP);  Surgeon: Lemar Lofty., MD;  Location: [facility];  Service: Gastroenterology;  Laterality: N/A;   LEFT HEART CATHETERIZATION WITH CORONARY ANGIOGRAM N/A 03/25/2013   Procedure: LEFT HEART CATHETERIZATION WITH CORONARY ANGIOGRAM;  Surgeon: Kathleene Hazel, MD;  Location: [facility];  Service: Cardiovascular;  Laterality: N/A;   ROBOTIC ASSISTED TOTAL HYSTERECTOMY WITH BILATERAL SALPINGO OOPHERECTOMY Bilateral 08/16/2014   Procedure: ROBOTIC ASSISTED TOTAL HYSTERECTOMY WITH BILATERAL SALPINGO OOPHORECTOMY ;  Surgeon: Adolphus Birchwood, MD;  Location: [facility];  Service: Gynecology;  Laterality: Bilateral;   UPPER GASTROINTESTINAL ENDOSCOPY     uterine polyp     had D and C done  to remove the polyp    OB History   No obstetric history on file.      Home Medications    Prior to Admission medications   Medication Sig Start Date End Date Taking? Authorizing Provider  cetirizine (ZYRTEC) 5 MG tablet Take 1 tablet (5 mg total) by mouth daily. 06/09/23  Yes Carlisle Beers, FNP  predniSONE (DELTASONE) 20 MG tablet Take 2 tablets (40 mg total) by mouth daily with breakfast for 5 days. 06/09/23 06/14/23 Yes StanhopeDonavan Burnet, FNP  Calcium Carb-Cholecalciferol (CALCIUM + D3) 600-200 MG-UNIT TABS Take 1 tablet by mouth every morning.     [provider]  Cyanocobalamin (VITAMIN B 12 PO) Take 1,000 tablets by mouth daily.    [provider]  FERREX 150 150 MG capsule Take 150 mg by mouth 2 (two) times daily. 09/02/22   [provider]  furosemide (LASIX) 20 MG tablet Take 1 tablet (20 mg total) by mouth daily. 03/14/21   Corky Crafts, MD  levothyroxine (SYNTHROID, LEVOTHROID)  100 MCG tablet Take 100 mcg by mouth daily before breakfast.    [provider]  lisinopril (ZESTRIL) 40 MG tablet TAKE ONE TABLET BY MOUTH DAILY 04/24/23   Kathleene Hazel, MD  metoprolol succinate (TOPROL-XL) 50 MG 24 hr tablet TAKE 1 & 1/2 TABLETS BY MOUTH DAILY. TAKE WITH OR immediately following a meal 04/24/23   Kathleene Hazel, MD  metroNIDAZOLE (METROGEL) 0.75 % gel Apply 1 application topically daily. Patient places on her cheeks and nose, only about once or twice a week depending on the climate.    [provider]  nitroGLYCERIN (NITROSTAT) 0.4 MG SL tablet DISSOLVE ONE TABLET UNDER THE TONGUE AS NEEDED FOR CHEST PAIN MAY REPEAT IN FIVE MINUTES FOR TWO DOSES 04/17/22   Corky Crafts, MD  pantoprazole (PROTONIX) 40 MG tablet Take 1 tablet (40 mg total) by mouth 2 (two) times daily. Please call to schedule an overdue appointment with Dr. Eldridge Dace for refills, [phone], thank you. 1st attempt. 10/26/22 01/24/23  Marinda Elk, MD  potassium chloride SA (KLOR-CON M) 20 MEQ tablet Take 1 tablet (20 mEq total) by mouth daily. 03/14/21   Corky Crafts, MD  rivaroxaban (XARELTO) 20 MG TABS tablet Take 1 tablet (20 mg total) by mouth daily with supper. 11/01/22   Marinda Elk, MD    Family History Family History  Problem Relation Age of Onset   Heart failure Mother        died in her 62's.   Cancer Mother    Diabetes Mother    Heart disease Mother    Hypertension Mother    Heart attack Mother    Other Father        died in WWII   Hyperlipidemia Sister    Diabetes Sister    Heart attack Maternal Grandmother    Colon cancer Cousin    Esophageal cancer Neg Hx    Stomach cancer Neg Hx    Stroke Neg Hx    Rectal cancer Neg Hx     Social History Social History   Tobacco Use   Smoking status: Former    Current packs/day: 0.00    Average packs/day: 0.2 packs/day for 2.0 years (0.4 ttl pk-yrs)    Types: Cigarettes    Start date: 03/18/1966    Quit date: 03/18/1968    Years since quitting: 55.2   Smokeless tobacco: Never  Vaping Use   Vaping status: Never Used  Substance Use Topics   Alcohol use: No   Drug use: No     Allergies   Bee venom, Contrast media [iodinated contrast media], Fish allergy, and Iohexol   Review of Systems Review of Systems Per HPI   Physical Exam Triage Vital Signs ED Triage Vitals  Encounter Vitals Group     BP 06/09/23 1632 (!) 193/86     Systolic BP Percentile --      Diastolic BP Percentile --      Pulse Rate 06/09/23 1632 76     Resp 06/09/23 1632 20     Temp 06/09/23 1632 98.1 F (36.7 C)     Temp Source 06/09/23 1632 Oral     SpO2 06/09/23 1632 97 %     Weight --      Height --      Head Circumference --      Peak Flow --      Pain Score 06/09/23 1633 0     Pain Loc --  Pain Education --      Exclude from Growth Chart --    No data found.  Updated Vital Signs BP (!) 193/86 (BP Location: Left Arm)   Pulse 76   Temp  98.1 F (36.7 C) (Oral)   Resp 20   SpO2 97%   Visual Acuity Right Eye Distance:   Left Eye Distance:   Bilateral Distance:    Right Eye Near:   Left Eye Near:    Bilateral Near:     Physical Exam Vitals and nursing note reviewed.  Constitutional:      Appearance: She is not ill-appearing or toxic-appearing.  HENT:     Head: Normocephalic and atraumatic.     Right Ear: Hearing and external ear normal.     Left Ear: Hearing and external ear normal.     Nose: Nose normal.     Mouth/Throat:     Lips: Pink.  Eyes:     General: Lids are normal. Vision grossly intact. Gaze aligned appropriately.     Extraocular Movements: Extraocular movements intact.     Conjunctiva/sclera: Conjunctivae normal.  Cardiovascular:     Rate and Rhythm: Normal rate and regular rhythm.     Heart sounds: Normal heart sounds, S1 normal and S2 normal.  Pulmonary:     Effort: Pulmonary effort is normal. No respiratory distress.     Breath sounds: Normal breath sounds and air entry.  Musculoskeletal:     Cervical back: Neck supple.  Skin:    General: Skin is warm and dry.     Capillary Refill: Capillary refill takes less than 2 seconds.     Findings: Rash (Patchy areas of erythema and maculopapular lesions to the diffuse body.) present.     Comments: Erythematous maculopapular rash to the diffuse chest, trunk, back, buttocks, and arms/legs..  Patchy erythema to the bilateral antecubital spaces and flexural surfaces of the posterior knees.   Neurological:     General: No focal deficit present.     Mental Status: She is alert and oriented to person, place, and time. Mental status is at baseline.     Cranial Nerves: No dysarthria or facial asymmetry.  Psychiatric:        Mood and Affect: Mood normal.        Speech: Speech normal.        Behavior: Behavior normal.        Thought Content: Thought content normal.        Judgment: Judgment normal.      UC Treatments / Results  Labs (all labs  ordered are listed, but only abnormal results are displayed) Labs Reviewed - No data to display  EKG   Radiology No results found.  Procedures Procedures (including critical care time)  Medications Ordered in UC Medications - No data to display  Initial Impression / Assessment and Plan / UC Course  I have reviewed the triage vital signs and the nursing notes.  Pertinent labs & imaging results that were available during my care of the patient were reviewed by me and considered in my medical decision making (see chart for details).   1.  Allergic reaction to contrast dye, allergic dermatitis Presentation is consistent with allergic reaction to contrast. Patient has been managing this at home with antihistamines and topical steroids. Recommend course of oral prednisone to reduce inflammation and allergic reaction.  Recommend use of Zyrtec once daily for the next 7 days.  Prednisone burst to be started tomorrow. HEENT exam is  stable, no respiratory component to allergic reaction.  She has a follow-up appointment with cardiology in 3 days, advised to discuss contrast dye further at appointment to come up with a plan for future procedures where contrast dye is needed.  2. Essential hypertension BP elevated in clinic today. No red flag signs/symptoms indicating need for referral to ED due to elevated BP.  Discussed lifestyle and dietary changes to lower BP further. Advised to continue taking BP medication as prescribed and follow-up with PCP to discuss management of HTN further.  BP Readings from Last 3 Encounters:  06/09/23 (!) 193/86  06/03/23 (!) 174/73  02/27/23 (!) 188/88     Counseled patient on potential for adverse effects with medications prescribed/recommended today, strict ER and return-to-clinic precautions discussed, patient verbalized understanding.    Final Clinical Impressions(s) / UC Diagnoses   Final diagnoses:  Allergic reaction to contrast dye, initial  encounter  Allergic dermatitis  Essential hypertension     Discharge Instructions      Take prednisone 40mg  once daily (2 pills) each morning starting tomorrow. Take zyrtec 5mg  once daily at bedtime for the next 7 days. Follow-up with cardiology as scheduled.  If you develop any new or worsening symptoms or if your symptoms do not start to improve, please return here or follow-up with your primary care provider. If your symptoms are severe, please go to the emergency room.     ED Prescriptions     Medication Sig Dispense Auth. Provider   cetirizine (ZYRTEC) 5 MG tablet Take 1 tablet (5 mg total) by mouth daily. 7 tablet Reita May M, FNP   predniSONE (DELTASONE) 20 MG tablet Take 2 tablets (40 mg total) by mouth daily with breakfast for 5 days. 10 tablet Carlisle Beers, FNP      PDMP not reviewed this encounter.   Carlisle Beers, FNP 06/09/23 1758    Carlisle Beers, FNP 06/09/23 1914

## 2023-06-09 NOTE — Discharge Instructions (Signed)
 Take prednisone 40mg  once daily (2 pills) each morning starting tomorrow. Take zyrtec 5mg  once daily at bedtime for the next 7 days. Follow-up with cardiology as scheduled.  If you develop any new or worsening symptoms or if your symptoms do not start to improve, please return here or follow-up with your primary care provider. If your symptoms are severe, please go to the emergency room.

## 2023-06-09 NOTE — ED Triage Notes (Signed)
 Pt states had a CT with contrast last Tuesday and still has a itchy rash. States was given prednisone prior to procedure and taking allergy pills, and using cortisone cream with little relief.

## 2023-06-12 ENCOUNTER — Encounter: Payer: Self-pay | Admitting: Cardiovascular Disease

## 2023-06-12 ENCOUNTER — Ambulatory Visit: Payer: Medicare Other | Attending: Cardiovascular Disease | Admitting: Cardiovascular Disease

## 2023-06-12 VITALS — BP 160/102 | HR 64 | Ht 66.0 in | Wt 192.2 lb

## 2023-06-12 DIAGNOSIS — I4892 Unspecified atrial flutter: Secondary | ICD-10-CM | POA: Diagnosis not present

## 2023-06-12 DIAGNOSIS — I10 Essential (primary) hypertension: Secondary | ICD-10-CM | POA: Diagnosis not present

## 2023-06-12 DIAGNOSIS — E782 Mixed hyperlipidemia: Secondary | ICD-10-CM | POA: Diagnosis not present

## 2023-06-12 DIAGNOSIS — I2510 Atherosclerotic heart disease of native coronary artery without angina pectoris: Secondary | ICD-10-CM | POA: Diagnosis not present

## 2023-06-12 MED ORDER — PANTOPRAZOLE SODIUM 40 MG PO TBEC: 40.0000 mg | DELAYED_RELEASE_TABLET | Freq: Every day | ORAL | 3 refills | Status: AC

## 2023-06-12 NOTE — Progress Notes (Signed)
 Chief Complaint  Patient presents with   Follow-up    CAD   History of Present Illness: 83 yo female with history of CAD, atrial flutter, HLD, HTN, GERD and hypothyroidism here today for cardiac follow up. She has been followed in our office by Dr. Eldridge Dace. She had an inferior STEMI in January 2015 and had a bare metal stent placed in the Circumflex. She had atrial fib/flutter in 2021 and has been on Xarelto. Echo January 2021 with LVEF=60-65%. No valve disease. She was admitted to [facility] in August 2024 with anemia secondary to GI bleeding. She has been seen by Dr. Lalla Brothers and he is planning Watchman Device placement. Cardiac CT in preparation for Watchman this week and she developed a rash felt to be consistent with contrast reaction. She was seen in the Urgent Care and treated with prednisone.   She is here today for follow up. The patient denies any chest pain, dyspnea, palpitations, lower extremity edema, orthopnea, PND, dizziness, near syncope or syncope.   Primary Care Physician: Geoffry Paradise, MD   Past Medical History:  Diagnosis Date   Acute inferolateral myocardial infarction (HCC) 03/24/2013   Acute MI, inferoposterior wall, initial episode of care [facility]) 03/28/2013   Acute myocardial infarction of other inferior wall, initial episode of care    Amnesia, global, transient    Arthritis    needs left knee replacement   Blood transfusion without reported diagnosis    CAD (coronary artery disease)    a. 03/2013 Infpost STEMI/PCI: LM nl, LAD min irregs, LCX 100 (3.0x12 Vision BMS), RCA  mild ostial dzs, EF 55%.   Essential hypertension, benign    Fibroids    a. uterine - spotting noted since 03/11/2013.   GERD 11/19/2007   Qualifier: Diagnosis of  By: Marina Goodell MD, Wilhemina Bonito    GERD (gastroesophageal reflux disease)    Hearing loss    a. s/p cochlear implant on right, hearing aid on left.   Hernia of anterior abdominal wall    History of cardiovascular stress test    Lexiscan  Myoview 8/16:  EF 58%, inferolateral scar, no ischemia; Low Risk   Hyperlipidemia    Hypothyroidism    Ischemic cardiomyopathy    Echo 7/16:  Septal and posterior lateral HK, EF 45-50%, mild LAE   IV Contrast Allergy    Near syncope 06/26/2014   Obesity    Old MI (myocardial infarction) 03/03/2014   Retrograde amnesia 06/26/2014   Rosacea    Ventricular tachycardia (HCC)    a. Immediate post pci/MI - no complications, on BB.    Past Surgical History:  Procedure Laterality Date   BIOPSY  10/24/2022   Procedure: BIOPSY;  Surgeon: Mansouraty, Netty Starring., MD;  Location: [facility] ENDOSCOPY;  Service: Gastroenterology;;   CERVICAL POLYPECTOMY     2010   CHOLECYSTECTOMY     COCHLEAR IMPLANT     ESOPHAGOGASTRODUODENOSCOPY (EGD) WITH PROPOFOL N/A 10/24/2022   Procedure: ESOPHAGOGASTRODUODENOSCOPY (EGD) WITH PROPOFOL;  Surgeon: Lemar Lofty., MD;  Location: [facility];  Service: Gastroenterology;  Laterality: N/A;   GASTRIC VARICES BANDING  10/24/2022   Procedure: GASTRIC VARICES BANDING;  Surgeon: Meridee Score Netty Starring., MD;  Location: [facility]OS[facility];  Service: Gastroenterology;;   HOT HEMOSTASIS N/A 10/24/2022   Procedure: HOT HEMOSTASIS (ARGON PLASMA COAGULATION/BICAP);  Surgeon: Lemar Lofty., MD;  Location: [facility];  Service: Gastroenterology;  Laterality: N/A;   LEFT HEART CATHETERIZATION WITH CORONARY ANGIOGRAM N/A 03/25/2013   Procedure: LEFT HEART CATHETERIZATION WITH CORONARY  ANGIOGRAM;  Surgeon: Kathleene Hazel, MD;  Location: [facility] CATH LAB;  Service: Cardiovascular;  Laterality: N/A;   ROBOTIC ASSISTED TOTAL HYSTERECTOMY WITH BILATERAL SALPINGO OOPHERECTOMY Bilateral 08/16/2014   Procedure: ROBOTIC ASSISTED TOTAL HYSTERECTOMY WITH BILATERAL SALPINGO OOPHORECTOMY ;  Surgeon: Adolphus Birchwood, MD;  Location: [facility] ORS;  Service: Gynecology;  Laterality: Bilateral;   UPPER GASTROINTESTINAL ENDOSCOPY     uterine polyp     had D and C done to remove the polyp    Current  Outpatient Medications  Medication Sig Dispense Refill   Calcium Carb-Cholecalciferol (CALCIUM + D3) 600-200 MG-UNIT TABS Take 1 tablet by mouth every morning.      cetirizine (ZYRTEC) 5 MG tablet Take 1 tablet (5 mg total) by mouth daily. 7 tablet 0   Cyanocobalamin (VITAMIN B 12 PO) Take 1,000 tablets by mouth daily.     FERREX 150 150 MG capsule Take 150 mg by mouth 2 (two) times daily.     furosemide (LASIX) 20 MG tablet Take 1 tablet (20 mg total) by mouth daily. 90 tablet 1   levothyroxine (SYNTHROID, LEVOTHROID) 100 MCG tablet Take 100 mcg by mouth daily before breakfast.     lisinopril (ZESTRIL) 40 MG tablet TAKE ONE TABLET BY MOUTH DAILY 90 tablet 0   metoprolol succinate (TOPROL-XL) 50 MG 24 hr tablet TAKE 1 & 1/2 TABLETS BY MOUTH DAILY. TAKE WITH OR immediately following a meal 135 tablet 0   metroNIDAZOLE (METROGEL) 0.75 % gel Apply 1 application topically daily. Patient places on her cheeks and nose, only about once or twice a week depending on the climate.     nitroGLYCERIN (NITROSTAT) 0.4 MG SL tablet DISSOLVE ONE TABLET UNDER THE TONGUE AS NEEDED FOR CHEST PAIN MAY REPEAT IN FIVE MINUTES FOR TWO DOSES 25 tablet 3   potassium chloride SA (KLOR-CON M) 20 MEQ tablet Take 1 tablet (20 mEq total) by mouth daily. 90 tablet 1   predniSONE (DELTASONE) 20 MG tablet Take 2 tablets (40 mg total) by mouth daily with breakfast for 5 days. 10 tablet 0   rivaroxaban (XARELTO) 20 MG TABS tablet Take 1 tablet (20 mg total) by mouth daily with supper.     pantoprazole (PROTONIX) 40 MG tablet Take 1 tablet (40 mg total) by mouth daily. 90 tablet 3   Current Facility-Administered Medications  Medication Dose Route Frequency Provider Last Rate Last Admin   0.9 %  sodium chloride infusion  500 mL Intravenous Once Hilarie Fredrickson, MD        Allergies  Allergen Reactions   Bee Venom Itching and Swelling   Contrast Media [Iodinated Contrast Media] Hives    dye   Fish Allergy Anaphylaxis    Shell  fish   Iohexol Hives   Crestor [Rosuvastatin]     Fatigue, muscle aches    Social History   Socioeconomic History   Marital status: Divorced    Spouse name: Not on file   Number of children: Not on file   Years of education: Not on file   Highest education level: Not on file  Occupational History   Occupation: retired from Design[employer], industrial/product work  Tobacco Use   Smoking status: Former    Current packs/day: 0.00    Average packs/day: 0.2 packs/day for 2.0 years (0.4 ttl pk-yrs)    Types: Cigarettes    Start date: 03/18/1966    Quit date: 03/18/1968    Years since quitting: 55.2   Smokeless tobacco: Never  Vaping Use   Vaping  status: Never Used  Substance and Sexual Activity   Alcohol use: No   Drug use: No   Sexual activity: Not Currently  Other Topics Concern   Not on file  Social History Narrative   Lives in [place] by herself.  Sister is nearby and is Healthcare POA.   Social Drivers of Corporate investment banker Strain: Not on file  Food Insecurity: No Food Insecurity (12/26/2022)   Hunger Vital Sign    Worried About Running Out of Food in the Last Year: Never true    Ran Out of Food in the Last Year: Never true  Transportation Needs: No Transportation Needs (12/26/2022)   PRAPARE - Administrator, Civil Service (Medical): No    Lack of Transportation (Non-Medical): No  Physical Activity: Not on file  Stress: Not on file  Social Connections: Not on file  Intimate Partner Violence: Not At Risk (12/26/2022)   Humiliation, Afraid, Rape, and Kick questionnaire    Fear of Current or Ex-Partner: No    Emotionally Abused: No    Physically Abused: No    Sexually Abused: No    Family History  Problem Relation Age of Onset   Heart failure Mother        died in her 69's.   Cancer Mother    Diabetes Mother    Heart disease Mother    Hypertension Mother    Heart attack Mother    Other Father        died in WWII   Hyperlipidemia Sister    Diabetes Sister     Heart attack Maternal Grandmother    Colon cancer Cousin    Esophageal cancer Neg Hx    Stomach cancer Neg Hx    Stroke Neg Hx    Rectal cancer Neg Hx     Review of Systems:  As stated in the HPI and otherwise negative.   BP (!) 160/102   Pulse 64   Ht 5\' 6"  (1.676 m)   Wt 87.2 kg   SpO2 96%   BMI 31.02 kg/m   Physical Examination: General: Well developed, well nourished, NAD  HEENT: OP clear, mucus membranes moist  SKIN: warm, dry. No rashes. Neuro: No focal deficits  Musculoskeletal: Muscle strength 5/5 all ext  Psychiatric: Mood and affect normal  Neck: No JVD, no carotid bruits, no thyromegaly, no lymphadenopathy.  Lungs:Clear bilaterally, no wheezes, rhonci, crackles Cardiovascular: Regular rate and rhythm. No murmurs, gallops or rubs. Abdomen:Soft. Bowel sounds present. Non-tender.  Extremities: No lower extremity edema. Pulses are 2 + in the bilateral DP/PT.  EKG:  EKG is not ordered today. The ekg ordered today demonstrates   Recent Labs: 10/22/2022: TSH 6.867 05/28/2023: ALT 9; BUN 17; Creatinine 0.72; Hemoglobin 13.9; Platelet Count 196; Potassium 4.2; Sodium 139   Lipid Panel    Component Value Date/Time   CHOL 129 12/23/2019 0823   CHOL 148 09/30/2017 0832   TRIG 69 12/23/2019 0823   HDL 44 12/23/2019 0823   HDL 46 09/30/2017 0832   CHOLHDL 2.9 12/23/2019 0823   VLDL 14 12/23/2019 0823   LDLCALC NOT CALCULATED 12/23/2019 0823   LDLCALC 78 09/30/2017 0832     Wt Readings from Last 3 Encounters:  06/12/23 87.2 kg  02/27/23 86.2 kg  02/19/23 84.4 kg    Assessment and Plan:   1. CAD without angina: No chest pain. Continue beta blocker. Statin intolerant. She is not interested in any other therapy for her lipids. She  is not on an ASA since she is on Xarelto.   2. Atrial flutter: Followed in EP. Sinus today. Continue beta blocker and Xarelto. Consideration for Xarelto.   3. Hyperlipidemia: Statin intolerant. She does not wish to consider any other  therapy. She has reviewed options for Repatha and Praluent in the past but refuses to consider.   4. HTN: BP is well controlled at home. She reports "white coat HTN" today. Continue Lisinopril and Toprol.   Labs/ tests ordered today include:  No orders of the defined types were placed in this encounter.  Disposition:   F/U with me in one year   Signed, Verne Carrow, MD, [facility] 06/12/2023 10:01 AM    [facility] [address] [place], [place], [place]  [address] Phone: [phone]; Fax: [phone]

## 2023-06-12 NOTE — Patient Instructions (Signed)
Medication Instructions:  No changes *If you need a refill on your cardiac medications before your next appointment, please call your pharmacy*   Lab Work: none   Testing/Procedures: none   Follow-Up: At [facility], you and your health needs are our priority.  As part of our continuing mission to provide you with exceptional heart care, we have created designated Provider Care Teams.  These Care Teams include your primary Cardiologist (physician) and Advanced Practice Providers (APPs -  Physician Assistants and Nurse Practitioners) who all work together to provide you with the care you need, when you need it.   Your next appointment:   12 month(s)  Provider:   Christopher McAlhany, MD   

## 2023-06-16 ENCOUNTER — Telehealth: Payer: Self-pay

## 2023-06-16 NOTE — Telephone Encounter (Addendum)
 [name]: Chicken wing Max 26/ AVG 24/ Depth 20 Likely use a 31mm device and double curve sheath Inf/Mid TSP RAO 18 CAU 18

## 2023-06-18 ENCOUNTER — Telehealth: Payer: Self-pay

## 2023-06-18 NOTE — Telephone Encounter (Signed)
-----   Message from Rossie Muskrat [facility] sent at 06/17/2023  2:34 PM EDT ----- With that significant of a reaction, I would favor avoiding watchman implant. Orpha Bur, can you let her know? Thanks, Sheria Lang ----- Message ----- From: Henrietta Dine, RN Sent: 06/11/2023   3:37 PM EDT To: Lanier Prude, MD  Reviewed results with patient who verbalized understanding.   While the patient's anatomy is suitable for Watchman, she had an impressive reaction after cCT that she says is worse than her last reaction after receiving contrast. She premedicated as directed but later that night after her scan, she developed a rash on her neck and chest. The next couple days, the rash spread to cover her trunk, arms, and legs and was red, blotchy, itchy, and hot. She called the Radiology department and was instructed to take Benadryl. She took it for 5 days and went to Urgent Care 3/24. She denies ever having SOB, fever, nausea, dizziness, or feeling like her throat was closing. She was given oral steroids and instructed to take Zyrtec x7 days. She reports she is feeling much better and her rash continues to improve every day.   Will route to Dr. Lalla Brothers for recommendations given she will need contrast for the Watchman procedure.

## 2023-06-18 NOTE — Telephone Encounter (Signed)
 Per Dr. Lalla Brothers, informed the patient that due to her progressive reactions to contrast, he will not proceed with LAAO. While saddened, she agreed with plan. She was grateful for follow-up.

## 2023-06-25 ENCOUNTER — Other Ambulatory Visit: Payer: Self-pay | Admitting: Hematology and Oncology

## 2023-06-25 ENCOUNTER — Inpatient Hospital Stay: Payer: Medicare Other | Attending: Hematology and Oncology

## 2023-06-25 DIAGNOSIS — D500 Iron deficiency anemia secondary to blood loss (chronic): Secondary | ICD-10-CM

## 2023-06-25 DIAGNOSIS — K922 Gastrointestinal hemorrhage, unspecified: Secondary | ICD-10-CM | POA: Insufficient documentation

## 2023-06-25 LAB — CMP (CANCER CENTER ONLY)
ALT: 8 U/L (ref 0–44)
AST: 15 U/L (ref 15–41)
Albumin: 4.2 g/dL (ref 3.5–5.0)
Alkaline Phosphatase: 63 U/L (ref 38–126)
Anion gap: 5 (ref 5–15)
BUN: 18 mg/dL (ref 8–23)
CO2: 30 mmol/L (ref 22–32)
Calcium: 9.4 mg/dL (ref 8.9–10.3)
Chloride: 103 mmol/L (ref 98–111)
Creatinine: 0.75 mg/dL (ref 0.44–1.00)
GFR, Estimated: >60 mL/min (ref >60)
Glucose, Bld: 92 mg/dL (ref 70–99)
Potassium: 4.4 mmol/L (ref 3.5–5.1)
Sodium: 138 mmol/L (ref 135–145)
Total Bilirubin: 0.5 mg/dL (ref 0.0–1.2)
Total Protein: 7.2 g/dL (ref 6.5–8.1)

## 2023-06-25 LAB — CBC WITH DIFFERENTIAL (CANCER CENTER ONLY)
Abs Immature Granulocytes: 0.01 10*3/uL (ref 0.00–0.07)
Basophils Absolute: 0.1 10*3/uL (ref 0.0–0.1)
Basophils Relative: 1 %
Eosinophils Absolute: 0.2 10*3/uL (ref 0.0–0.5)
Eosinophils Relative: 5 %
HCT: 42.5 % (ref 36.0–46.0)
Hemoglobin: 14.2 g/dL (ref 12.0–15.0)
Immature Granulocytes: 0 %
Lymphocytes Relative: 23 %
Lymphs Abs: 1.2 10*3/uL (ref 0.7–4.0)
MCH: 31.1 pg (ref 26.0–34.0)
MCHC: 33.4 g/dL (ref 30.0–36.0)
MCV: 93 fL (ref 80.0–100.0)
Monocytes Absolute: 0.4 10*3/uL (ref 0.1–1.0)
Monocytes Relative: 7 %
Neutro Abs: 3.3 10*3/uL (ref 1.7–7.7)
Neutrophils Relative %: 64 %
Platelet Count: 187 10*3/uL (ref 150–400)
RBC: 4.57 MIL/uL (ref 3.87–5.11)
RDW: 12.3 % (ref 11.5–15.5)
WBC Count: 5.1 10*3/uL (ref 4.0–10.5)
nRBC: 0 % (ref 0.0–0.2)

## 2023-06-25 LAB — RETIC PANEL
Immature Retic Fract: 3.2 % (ref 2.3–15.9)
RBC.: 4.62 MIL/uL (ref 3.87–5.11)
Retic Count, Absolute: 36.5 10*3/uL (ref 19.0–186.0)
Retic Ct Pct: 0.8 % (ref 0.4–3.1)
Reticulocyte Hemoglobin: 35.3 pg (ref >27.9)

## 2023-06-25 LAB — IRON AND IRON BINDING CAPACITY (CC-WL,HP ONLY)
Iron: 100 ug/dL (ref 28–170)
Saturation Ratios: 27 % (ref 10.4–31.8)
TIBC: 374 ug/dL (ref 250–450)
UIBC: 274 ug/dL (ref 148–442)

## 2023-06-25 LAB — FERRITIN: Ferritin: 65 ng/mL (ref 11–307)

## 2023-07-01 NOTE — Progress Notes (Unsigned)
 [facility] [phone]   [phone]  PROGRESS NOTE  Patient Care Team: Geoffry Paradise, MD as PCP - General (Internal Medicine) Lanier Prude, MD as PCP - Electrophysiology (Cardiology) Kathleene Hazel, MD as PCP - Cardiology (Cardiology) Kathleene Hazel, MD as Consulting Physician (Cardiology)  Hematological/Oncological History # Iron Deficiency Anemia 2/2 to GI Bleeding 10/24/2022: Upper GI endoscopy showed 3 angiodysplastic lesions with no bleeding. Treated with argon plasma. Erythematous mucosa noted in stomach lining. Biopsy showed no H. Pylori but did not reactive gastropathy.  12/13/2022: WBC 3.9, Hgb 7.7, MCV 84.5, Plt 241 12/26/2022: establish care with Dr. Leonides Schanz   Interval History:  [name] 83 y.o. female with medical history significant for IDA from GI bleeding who presents for a follow up visit. The patient's last visit was on 02/27/2023. In the interim since the last visit she has had no major changes in her health.  On exam [name] reports she has been well since we last talked.  She reports that she was to have a Watchman procedure performed but unfortunate she was allergic to the dye and the procedure was canceled.  She has continued on Xarelto since that time but had no evidence of bleeding such as blood in the urine, blood in the stool, or dark stools.  She is not having any nosebleeding or bruising.  She reports that she has had no trouble with lightheadedness, dizziness, or shortness of breath.  Overall her energy levels are strong and her appetite is good.  She is tolerating her iron capsules well without any nausea, vomiting, or constipation.  A full 10 point ROS is otherwise negative.  MEDICAL HISTORY:  Past Medical History:  Diagnosis Date   Acute inferolateral myocardial infarction (HCC) 03/24/2013   Acute MI, inferoposterior wall, initial episode of care (HCC) 03/28/2013   Acute myocardial  infarction of other inferior wall, initial episode of care    Amnesia, global, transient    Arthritis    needs left knee replacement   Blood transfusion without reported diagnosis    CAD (coronary artery disease)    a. 03/2013 Infpost STEMI/PCI: LM nl, LAD min irregs, LCX 100 (3.0x12 Vision BMS), RCA  mild ostial dzs, EF 55%.   Essential hypertension, benign    Fibroids    a. uterine - spotting noted since 03/11/2013.   GERD 11/19/2007   Qualifier: Diagnosis of  By: Marina Goodell MD, Wilhemina Bonito    GERD (gastroesophageal reflux disease)    Hearing loss    a. s/p cochlear implant on right, hearing aid on left.   Hernia of anterior abdominal wall    History of cardiovascular stress test    Lexiscan Myoview 8/16:  EF 58%, inferolateral scar, no ischemia; Low Risk   Hyperlipidemia    Hypothyroidism    Ischemic cardiomyopathy    Echo 7/16:  Septal and posterior lateral HK, EF 45-50%, mild LAE   IV Contrast Allergy    Near syncope 06/26/2014   Obesity    Old MI (myocardial infarction) 03/03/2014   Retrograde amnesia 06/26/2014   Rosacea    Ventricular tachycardia (HCC)    a. Immediate post pci/MI - no complications, on BB.    SURGICAL HISTORY: Past Surgical History:  Procedure Laterality Date   BIOPSY  10/24/2022   Procedure: BIOPSY;  Surgeon: Mansouraty, Netty Starring., MD;  Location: [facility];  Service: Gastroenterology;;   CERVICAL POLYPECTOMY     2010   CHOLECYSTECTOMY  COCHLEAR IMPLANT     ESOPHAGOGASTRODUODENOSCOPY (EGD) WITH PROPOFOL N/A 10/24/2022   Procedure: ESOPHAGOGASTRODUODENOSCOPY (EGD) WITH PROPOFOL;  Surgeon: Meridee Score Netty Starring., MD;  Location: [facility];  Service: Gastroenterology;  Laterality: N/A;   GASTRIC VARICES BANDING  10/24/2022   Procedure: GASTRIC VARICES BANDING;  Surgeon: Meridee Score Netty Starring., MD;  Location: [facility];  Service: Gastroenterology;;   HOT HEMOSTASIS N/A 10/24/2022   Procedure: HOT HEMOSTASIS (ARGON PLASMA COAGULATION/BICAP);  Surgeon:  Lemar Lofty., MD;  Location: [facility];  Service: Gastroenterology;  Laterality: N/A;   LEFT HEART CATHETERIZATION WITH CORONARY ANGIOGRAM N/A 03/25/2013   Procedure: LEFT HEART CATHETERIZATION WITH CORONARY ANGIOGRAM;  Surgeon: Kathleene Hazel, MD;  Location: [facility];  Service: Cardiovascular;  Laterality: N/A;   ROBOTIC ASSISTED TOTAL HYSTERECTOMY WITH BILATERAL SALPINGO OOPHERECTOMY Bilateral 08/16/2014   Procedure: ROBOTIC ASSISTED TOTAL HYSTERECTOMY WITH BILATERAL SALPINGO OOPHORECTOMY ;  Surgeon: Adolphus Birchwood, MD;  Location: [facility];  Service: Gynecology;  Laterality: Bilateral;   UPPER GASTROINTESTINAL ENDOSCOPY     uterine polyp     had D and C done to remove the polyp    SOCIAL HISTORY: Social History   Socioeconomic History   Marital status: Divorced    Spouse name: Not on file   Number of children: Not on file   Years of education: Not on file   Highest education level: Not on file  Occupational History   Occupation: retired from Designer, industrial/product work  Tobacco Use   Smoking status: Former    Current packs/day: 0.00    Average packs/day: 0.2 packs/day for 2.0 years (0.4 ttl pk-yrs)    Types: Cigarettes    Start date: 03/18/1966    Quit date: 03/18/1968    Years since quitting: 55.3   Smokeless tobacco: Never  Vaping Use   Vaping status: Never Used  Substance and Sexual Activity   Alcohol use: No   Drug use: No   Sexual activity: Not Currently  Other Topics Concern   Not on file  Social History Narrative   Lives in [place] by herself.  Sister is nearby and is Healthcare POA.   Social Drivers of Corporate investment banker Strain: Not on file  Food Insecurity: No Food Insecurity (12/26/2022)   Hunger Vital Sign    Worried About Running Out of Food in the Last Year: Never true    Ran Out of Food in the Last Year: Never true  Transportation Needs: No Transportation Needs (12/26/2022)   PRAPARE - Administrator, Civil Service (Medical): No     Lack of Transportation (Non-Medical): No  Physical Activity: Not on file  Stress: Not on file  Social Connections: Not on file  Intimate Partner Violence: Not At Risk (12/26/2022)   Humiliation, Afraid, Rape, and Kick questionnaire    Fear of Current or Ex-Partner: No    Emotionally Abused: No    Physically Abused: No    Sexually Abused: No    FAMILY HISTORY: Family History  Problem Relation Age of Onset   Heart failure Mother        died in her 42's.   Cancer Mother    Diabetes Mother    Heart disease Mother    Hypertension Mother    Heart attack Mother    Other Father        died in WWII   Hyperlipidemia Sister    Diabetes Sister    Heart attack Maternal Grandmother    Colon cancer Cousin  Esophageal cancer Neg Hx    Stomach cancer Neg Hx    Stroke Neg Hx    Rectal cancer Neg Hx     ALLERGIES:  is allergic to bee venom, contrast media [iodinated contrast media], fish allergy, iohexol, and crestor [rosuvastatin].  MEDICATIONS:  Current Outpatient Medications  Medication Sig Dispense Refill   Calcium Carb-Cholecalciferol (CALCIUM + D3) 600-200 MG-UNIT TABS Take 1 tablet by mouth every morning.      cetirizine (ZYRTEC) 5 MG tablet Take 1 tablet (5 mg total) by mouth daily. 7 tablet 0   Cyanocobalamin (VITAMIN B 12 PO) Take 1,000 tablets by mouth daily.     FERREX 150 150 MG capsule Take 150 mg by mouth 2 (two) times daily.     furosemide (LASIX) 20 MG tablet Take 1 tablet (20 mg total) by mouth daily. 90 tablet 1   levothyroxine (SYNTHROID, LEVOTHROID) 100 MCG tablet Take 100 mcg by mouth daily before breakfast.     lisinopril (ZESTRIL) 40 MG tablet TAKE ONE TABLET BY MOUTH DAILY 90 tablet 0   metoprolol succinate (TOPROL-XL) 50 MG 24 hr tablet TAKE 1 & 1/2 TABLETS BY MOUTH DAILY. TAKE WITH OR immediately following a meal 135 tablet 0   metroNIDAZOLE (METROGEL) 0.75 % gel Apply 1 application topically daily. Patient places on her cheeks and nose, only about once or  twice a week depending on the climate.     nitroGLYCERIN (NITROSTAT) 0.4 MG SL tablet DISSOLVE ONE TABLET UNDER THE TONGUE AS NEEDED FOR CHEST PAIN MAY REPEAT IN FIVE MINUTES FOR TWO DOSES 25 tablet 3   pantoprazole (PROTONIX) 40 MG tablet Take 1 tablet (40 mg total) by mouth daily. 90 tablet 3   potassium chloride SA (KLOR-CON M) 20 MEQ tablet Take 1 tablet (20 mEq total) by mouth daily. 90 tablet 1   rivaroxaban (XARELTO) 20 MG TABS tablet Take 1 tablet (20 mg total) by mouth daily with supper.     Current Facility-Administered Medications  Medication Dose Route Frequency Provider Last Rate Last Admin   0.9 %  sodium chloride infusion  500 mL Intravenous Once Hilarie Fredrickson, MD        REVIEW OF SYSTEMS:   Constitutional: ( - ) fevers, ( - )  chills , ( - ) night sweats Eyes: ( - ) blurriness of vision, ( - ) double vision, ( - ) watery eyes Ears, nose, mouth, throat, and face: ( - ) mucositis, ( - ) sore throat Respiratory: ( - ) cough, ( - ) dyspnea, ( - ) wheezes Cardiovascular: ( - ) palpitation, ( - ) chest discomfort, ( - ) lower extremity swelling Gastrointestinal:  ( - ) nausea, ( - ) heartburn, ( - ) change in bowel habits Skin: ( - ) abnormal skin rashes Lymphatics: ( - ) new lymphadenopathy, ( - ) easy bruising Neurological: ( - ) numbness, ( - ) tingling, ( - ) new weaknesses Behavioral/Psych: ( - ) mood change, ( - ) new changes  All other systems were reviewed with the patient and are negative.  PHYSICAL EXAMINATION:  Vitals:   07/02/23 1052  BP: (!) 180/64  Pulse: 60  Resp: 13  Temp: 97.9 F (36.6 C)  SpO2: 100%    Filed Weights   07/02/23 1052  Weight: 193 lb 14.4 oz (88 kg)     GENERAL: Well-appearing elderly Caucasian female, alert, no distress and comfortable SKIN: skin color, texture, turgor are normal, no rashes or significant lesions EYES:  conjunctiva are pink and non-injected, sclera clear LUNGS: clear to auscultation and percussion with normal  breathing effort HEART: regular rate & rhythm and no murmurs and no lower extremity edema Musculoskeletal: no cyanosis of digits and no clubbing  PSYCH: alert & oriented x 3, fluent speech NEURO: no focal motor/sensory deficits  LABORATORY DATA:  I have reviewed the data as listed    Latest Ref Rng & Units 06/25/2023   10:37 AM 05/28/2023   10:29 AM 05/01/2023   10:44 AM  CBC  WBC 4.0 - 10.5 K/uL 5.1  4.1  8.1   Hemoglobin 12.0 - 15.0 g/dL 51.7  61.6  07.3   Hematocrit 36.0 - 46.0 % 42.5  40.2  42.3   Platelets 150 - 400 K/uL 187  196  181        Latest Ref Rng & Units 06/25/2023   10:37 AM 05/28/2023   10:29 AM 05/01/2023   10:44 AM  CMP  Glucose 70 - 99 mg/dL 92  710  626   BUN 8 - 23 mg/dL 18  17  20    Creatinine 0.44 - 1.00 mg/dL 9.48  5.46  2.70   Sodium 135 - 145 mmol/L 138  139  138   Potassium 3.5 - 5.1 mmol/L 4.4  4.2  3.8   Chloride 98 - 111 mmol/L 103  105  103   CO2 22 - 32 mmol/L 30  28  31    Calcium 8.9 - 10.3 mg/dL 9.4  9.1  9.3   Total Protein 6.5 - 8.1 g/dL 7.2  7.0  7.0   Total Bilirubin 0.0 - 1.2 mg/dL 0.5  0.5  1.0   Alkaline Phos 38 - 126 U/L 63  62  64   AST 15 - 41 U/L 15  16  14    ALT 0 - 44 U/L 8  9  8     RADIOGRAPHIC STUDIES: CT CARDIAC MORPH/PULM VEIN W/CM&W/O CA SCORE Addendum Date: 06/18/2023 ADDENDUM REPORT: 06/18/2023 02:07 EXAM: OVER-READ INTERPRETATION  CT CHEST The following report is an over-read performed by radiologist Dr. Myra Gianotti [facility] on 06/18/2023. This over-read does not include interpretation of cardiac or coronary anatomy or pathology. The heart morphology/pulmonary veins interpretation by the cardiologist is attached. COMPARISON:  CTA chest 05/16/2017 FINDINGS: No visualized pulmonary embolus. The imaged portion of the mediastinum is unremarkable. No visualized focal consolidation, pleural effusion, or pneumothorax. No acute abnormality in the visualized upper abdomen. No acute osseous abnormality. IMPRESSION: No  acute abnormality. Electronically Signed   By: Minerva Fester M.D.   On: 06/18/2023 02:07   Result Date: 06/18/2023 CLINICAL DATA:  Atrial fibrillation scheduled for left atrial appendage closure. EXAM: Cardiac CT/CTA TECHNIQUE: A non-contrast, gated CT scan was obtained with axial slices of 3 mm through the heart for calcium scoring. Calcium scoring was performed using the Agatston method. A 120 kV prospective, gated, contrast cardiac scan was obtained. Gantry rotation speed was 250 msecs and collimation was 0.6 mm. Nitroglycerin was not given. A delayed scan was obtained to exclude left atrial appendage thrombus. The 3D dataset was reconstructed in 5% intervals of the 25-50% of the R-R cycle. Late systolic phases were analyzed on a dedicated workstation using MPR, MIP, and VRT modes. The patient received 80 cc of contrast. FINDINGS: Image quality: excellent. Noise artifact is: Limited. Left Atrium: The left atrial size is dilated. There is a small PFO. There is normal pulmonary vein drainage into the left atrium (2 on the right  and 2 on the left). Left Atrial Appendage: Morphology: The left atrial appendage is large chicken wing type with two lobes. Thrombus: There is no thrombus in the left atrial appendage on contrast or delayed imaging. The following measurements were made regarding left atrial appendage closure: Phase assessed: 45% Landing Zone measurement: 28.4 mm x 20.9 mm LAA Length (maximum): 24.0 mm Optimal interatrial septum puncture site: Mid/mid Optimal deployment angle: RAO 3 CRA 8 Catheter: A double curve catheter is recommended. Watchman FLX Device: A 35 mm device is recommended with 19% compression. Other comments: None. Coronary Arteries: CAC scoring noted performed due to prior PCI. Normal coronary origin. Right dominance. The study was performed without use of NTG and is insufficient for plaque evaluation. Stent noted in the LCX. Right Atrium: Right atrial size is dilated. Right Ventricle: The  right ventricular cavity is within normal limits. Left Ventricle: The ventricular cavity size is within normal limits. Evidence of infarction of the mid posterior segment. Pulmonary Artery: Dilated pulmonary artery suggestive of pulmonary hypertension. Cardiac valves: The aortic valve is trileaflet without significant calcification. The mitral valve is normal structure without significant calcification. Aorta: Normal caliber with no significant disease. Pericardium: Normal thickness with no significant effusion or calcium present. Extra-cardiac findings: See attached radiology report for non-cardiac structures. IMPRESSION: 1. The left atrial appendage is a large chicken wing morphology without thrombus. 2. A 35 mm Watchman FLX device is recommended based on the above landing zone measurements. 3. A mid/mid IAS puncture site is recommended. 4. There is a small PFO. 5. Optimal deployment angle: RAO 3 CRA 8 6. Evidence of infarction of the mid posterior segment. 7. Dilated pulmonary artery suggestive of pulmonary hypertension. [facility] T. Flora Lipps, MD Electronically Signed: By: Lennie Odor M.D. On: 06/03/2023 20:22    ASSESSMENT & PLAN [name] 83 y.o. female with medical history significant for CAD, GERD, HTN, HLD, hypothyroidism, and obesity present for evaluation of iron deficiency anemia 2/2 to GI bleeding.    After review of the labs, review of the records, and discussion with the patient the patients findings are most consistent with anemia secondary to bleeding AVMs.   # Iron Deficiency Anemia 2/2 to GI Bleeding -- Findings are consistent with iron deficiency anemia secondary to patient's AVMs --Encouraged her to follow-up with GI for continued evaluation of her GI bleeding. Agree with capsule endoscopy if iron deficiency is persistent despite continued iron infusions.  --labs today show white blood cell 5.1, hemoglobin 14.2, MCV 93, platelets 187.  Ferritin 65 --Continue iron supplementation  daily with a source of vitamin C --We will plan to proceed with IV iron therapy in order to help bolster the patient's blood counts if she has another drop in iron levels.  Patient will require premedication with Benadryl and Pepcid as she received Monoferric last time and got hives. --RTC every 8 weeks for labs and in 6 months for clinic visit.     No orders of the defined types were placed in this encounter.   All questions were answered. The patient knows to call the clinic with any problems, questions or concerns.  A total of more than 30 minutes were spent on this encounter with face-to-face time and non-face-to-face time, including preparing to see the patient, ordering tests and/or medications, counseling the patient and coordination of care as outlined above.   Ulysees Barns, MD Department of Hematology/Oncology [facility] Phone: [phone] Pager: [phone] Email: [email or web address]  07/03/2023 9:18  AM

## 2023-07-02 ENCOUNTER — Inpatient Hospital Stay (HOSPITAL_BASED_OUTPATIENT_CLINIC_OR_DEPARTMENT_OTHER): Payer: Medicare Other | Admitting: Hematology and Oncology

## 2023-07-02 VITALS — BP 180/64 | HR 60 | Temp 97.9°F | Resp 13 | Wt 193.9 lb

## 2023-07-02 DIAGNOSIS — K922 Gastrointestinal hemorrhage, unspecified: Secondary | ICD-10-CM | POA: Diagnosis not present

## 2023-07-02 DIAGNOSIS — D500 Iron deficiency anemia secondary to blood loss (chronic): Secondary | ICD-10-CM | POA: Diagnosis not present

## 2023-07-26 ENCOUNTER — Other Ambulatory Visit: Payer: Self-pay | Admitting: Cardiovascular Disease

## 2023-07-26 DIAGNOSIS — I25118 Atherosclerotic heart disease of native coronary artery with other forms of angina pectoris: Secondary | ICD-10-CM

## 2023-08-27 ENCOUNTER — Inpatient Hospital Stay: Attending: Hematology and Oncology

## 2023-08-27 ENCOUNTER — Other Ambulatory Visit: Payer: Self-pay | Admitting: *Deleted

## 2023-08-27 DIAGNOSIS — D500 Iron deficiency anemia secondary to blood loss (chronic): Secondary | ICD-10-CM | POA: Insufficient documentation

## 2023-08-27 DIAGNOSIS — K922 Gastrointestinal hemorrhage, unspecified: Secondary | ICD-10-CM | POA: Insufficient documentation

## 2023-08-27 LAB — CMP (CANCER CENTER ONLY)
ALT: 9 U/L (ref 0–44)
AST: 16 U/L (ref 15–41)
Albumin: 4.1 g/dL (ref 3.5–5.0)
Alkaline Phosphatase: 62 U/L (ref 38–126)
Anion gap: 4 — ABNORMAL LOW (ref 5–15)
BUN: 18 mg/dL (ref 8–23)
CO2: 31 mmol/L (ref 22–32)
Calcium: 9.4 mg/dL (ref 8.9–10.3)
Chloride: 105 mmol/L (ref 98–111)
Creatinine: 0.68 mg/dL (ref 0.44–1.00)
GFR, Estimated: >60 mL/min (ref >60)
Glucose, Bld: 100 mg/dL — ABNORMAL HIGH (ref 70–99)
Potassium: 4 mmol/L (ref 3.5–5.1)
Sodium: 140 mmol/L (ref 135–145)
Total Bilirubin: 0.6 mg/dL (ref 0.0–1.2)
Total Protein: 7 g/dL (ref 6.5–8.1)

## 2023-08-27 LAB — CBC WITH DIFFERENTIAL (CANCER CENTER ONLY)
Abs Immature Granulocytes: 0.02 10*3/uL (ref 0.00–0.07)
Basophils Absolute: 0.1 10*3/uL (ref 0.0–0.1)
Basophils Relative: 1 %
Eosinophils Absolute: 0.2 10*3/uL (ref 0.0–0.5)
Eosinophils Relative: 4 %
HCT: 40.9 % (ref 36.0–46.0)
Hemoglobin: 13.8 g/dL (ref 12.0–15.0)
Immature Granulocytes: 0 %
Lymphocytes Relative: 21 %
Lymphs Abs: 1.1 10*3/uL (ref 0.7–4.0)
MCH: 30.7 pg (ref 26.0–34.0)
MCHC: 33.7 g/dL (ref 30.0–36.0)
MCV: 91.1 fL (ref 80.0–100.0)
Monocytes Absolute: 0.4 10*3/uL (ref 0.1–1.0)
Monocytes Relative: 8 %
Neutro Abs: 3.3 10*3/uL (ref 1.7–7.7)
Neutrophils Relative %: 66 %
Platelet Count: 169 10*3/uL (ref 150–400)
RBC: 4.49 MIL/uL (ref 3.87–5.11)
RDW: 12.5 % (ref 11.5–15.5)
WBC Count: 5 10*3/uL (ref 4.0–10.5)
nRBC: 0 % (ref 0.0–0.2)

## 2023-08-27 LAB — IRON AND IRON BINDING CAPACITY (CC-WL,HP ONLY)
Iron: 80 ug/dL (ref 28–170)
Saturation Ratios: 23 % (ref 10.4–31.8)
TIBC: 347 ug/dL (ref 250–450)
UIBC: 267 ug/dL (ref 148–442)

## 2023-08-27 LAB — RETIC PANEL
Immature Retic Fract: 4.3 % (ref 2.3–15.9)
RBC.: 4.45 MIL/uL (ref 3.87–5.11)
Retic Count, Absolute: 42.7 10*3/uL (ref 19.0–186.0)
Retic Ct Pct: 1 % (ref 0.4–3.1)
Reticulocyte Hemoglobin: 34.3 pg (ref >27.9)

## 2023-08-27 LAB — FERRITIN: Ferritin: 89 ng/mL (ref 11–307)

## 2023-09-10 DIAGNOSIS — I110 Hypertensive heart disease with heart failure: Secondary | ICD-10-CM | POA: Diagnosis not present

## 2023-09-10 DIAGNOSIS — E785 Hyperlipidemia, unspecified: Secondary | ICD-10-CM | POA: Diagnosis not present

## 2023-09-10 DIAGNOSIS — E611 Iron deficiency: Secondary | ICD-10-CM | POA: Diagnosis not present

## 2023-09-10 DIAGNOSIS — D649 Anemia, unspecified: Secondary | ICD-10-CM | POA: Diagnosis not present

## 2023-09-10 DIAGNOSIS — I509 Heart failure, unspecified: Secondary | ICD-10-CM | POA: Diagnosis not present

## 2023-09-10 DIAGNOSIS — E039 Hypothyroidism, unspecified: Secondary | ICD-10-CM | POA: Diagnosis not present

## 2023-09-15 DIAGNOSIS — E039 Hypothyroidism, unspecified: Secondary | ICD-10-CM | POA: Diagnosis not present

## 2023-09-15 DIAGNOSIS — Z1331 Encounter for screening for depression: Secondary | ICD-10-CM | POA: Diagnosis not present

## 2023-09-15 DIAGNOSIS — K922 Gastrointestinal hemorrhage, unspecified: Secondary | ICD-10-CM | POA: Diagnosis not present

## 2023-09-15 DIAGNOSIS — E785 Hyperlipidemia, unspecified: Secondary | ICD-10-CM | POA: Diagnosis not present

## 2023-09-15 DIAGNOSIS — E611 Iron deficiency: Secondary | ICD-10-CM | POA: Diagnosis not present

## 2023-09-15 DIAGNOSIS — I10 Essential (primary) hypertension: Secondary | ICD-10-CM | POA: Diagnosis not present

## 2023-09-15 DIAGNOSIS — Z0000 Encounter for general adult medical examination without abnormal findings: Secondary | ICD-10-CM | POA: Diagnosis not present

## 2023-09-15 DIAGNOSIS — I2510 Atherosclerotic heart disease of native coronary artery without angina pectoris: Secondary | ICD-10-CM | POA: Diagnosis not present

## 2023-09-15 DIAGNOSIS — R82998 Other abnormal findings in urine: Secondary | ICD-10-CM | POA: Diagnosis not present

## 2023-09-15 DIAGNOSIS — D6869 Other thrombophilia: Secondary | ICD-10-CM | POA: Diagnosis not present

## 2023-09-15 DIAGNOSIS — Z1339 Encounter for screening examination for other mental health and behavioral disorders: Secondary | ICD-10-CM | POA: Diagnosis not present

## 2023-09-15 DIAGNOSIS — I700 Atherosclerosis of aorta: Secondary | ICD-10-CM | POA: Diagnosis not present

## 2023-10-21 ENCOUNTER — Other Ambulatory Visit: Payer: Self-pay | Admitting: Hematology and Oncology

## 2023-10-21 DIAGNOSIS — D500 Iron deficiency anemia secondary to blood loss (chronic): Secondary | ICD-10-CM

## 2023-10-22 ENCOUNTER — Inpatient Hospital Stay: Attending: Hematology and Oncology

## 2023-10-22 DIAGNOSIS — K922 Gastrointestinal hemorrhage, unspecified: Secondary | ICD-10-CM | POA: Insufficient documentation

## 2023-10-22 DIAGNOSIS — D500 Iron deficiency anemia secondary to blood loss (chronic): Secondary | ICD-10-CM | POA: Insufficient documentation

## 2023-10-22 LAB — CMP (CANCER CENTER ONLY)
ALT: 8 U/L (ref 0–44)
AST: 16 U/L (ref 15–41)
Albumin: 4.1 g/dL (ref 3.5–5.0)
Alkaline Phosphatase: 66 U/L (ref 38–126)
Anion gap: 7 (ref 5–15)
BUN: 16 mg/dL (ref 8–23)
CO2: 27 mmol/L (ref 22–32)
Calcium: 9.2 mg/dL (ref 8.9–10.3)
Chloride: 105 mmol/L (ref 98–111)
Creatinine: 0.69 mg/dL (ref 0.44–1.00)
GFR, Estimated: >60 mL/min (ref >60)
Glucose, Bld: 111 mg/dL — ABNORMAL HIGH (ref 70–99)
Potassium: 4.1 mmol/L (ref 3.5–5.1)
Sodium: 139 mmol/L (ref 135–145)
Total Bilirubin: 0.7 mg/dL (ref 0.0–1.2)
Total Protein: 7.1 g/dL (ref 6.5–8.1)

## 2023-10-22 LAB — CBC WITH DIFFERENTIAL (CANCER CENTER ONLY)
Abs Immature Granulocytes: 0.02 K/uL (ref 0.00–0.07)
Basophils Absolute: 0.1 K/uL (ref 0.0–0.1)
Basophils Relative: 1 %
Eosinophils Absolute: 0.1 K/uL (ref 0.0–0.5)
Eosinophils Relative: 3 %
HCT: 42.2 % (ref 36.0–46.0)
Hemoglobin: 14.3 g/dL (ref 12.0–15.0)
Immature Granulocytes: 1 %
Lymphocytes Relative: 21 %
Lymphs Abs: 0.9 K/uL (ref 0.7–4.0)
MCH: 30.8 pg (ref 26.0–34.0)
MCHC: 33.9 g/dL (ref 30.0–36.0)
MCV: 90.9 fL (ref 80.0–100.0)
Monocytes Absolute: 0.3 K/uL (ref 0.1–1.0)
Monocytes Relative: 6 %
Neutro Abs: 3 K/uL (ref 1.7–7.7)
Neutrophils Relative %: 68 %
Platelet Count: 174 K/uL (ref 150–400)
RBC: 4.64 MIL/uL (ref 3.87–5.11)
RDW: 12.3 % (ref 11.5–15.5)
WBC Count: 4.4 K/uL (ref 4.0–10.5)
nRBC: 0 % (ref 0.0–0.2)

## 2023-10-22 LAB — IRON AND IRON BINDING CAPACITY (CC-WL,HP ONLY)
Iron: 102 ug/dL (ref 28–170)
Saturation Ratios: 28 % (ref 10.4–31.8)
TIBC: 365 ug/dL (ref 250–450)
UIBC: 263 ug/dL (ref 148–442)

## 2023-10-22 LAB — RETIC PANEL
Immature Retic Fract: 2.6 % (ref 2.3–15.9)
RBC.: 4.65 MIL/uL (ref 3.87–5.11)
Retic Count, Absolute: 35.3 K/uL (ref 19.0–186.0)
Retic Ct Pct: 0.8 % (ref 0.4–3.1)
Reticulocyte Hemoglobin: 35.7 pg (ref >27.9)

## 2023-10-22 LAB — FERRITIN: Ferritin: 92 ng/mL (ref 11–307)

## 2023-12-16 ENCOUNTER — Other Ambulatory Visit: Payer: Self-pay | Admitting: Physician Assistant

## 2023-12-16 DIAGNOSIS — D500 Iron deficiency anemia secondary to blood loss (chronic): Secondary | ICD-10-CM

## 2023-12-17 ENCOUNTER — Inpatient Hospital Stay: Attending: Hematology and Oncology

## 2023-12-17 DIAGNOSIS — K922 Gastrointestinal hemorrhage, unspecified: Secondary | ICD-10-CM | POA: Insufficient documentation

## 2023-12-17 DIAGNOSIS — D500 Iron deficiency anemia secondary to blood loss (chronic): Secondary | ICD-10-CM | POA: Insufficient documentation

## 2023-12-17 LAB — RETIC PANEL
Immature Retic Fract: 3.4 % (ref 2.3–15.9)
RBC.: 4.67 MIL/uL (ref 3.87–5.11)
Retic Count, Absolute: 40.2 K/uL (ref 19.0–186.0)
Retic Ct Pct: 0.9 % (ref 0.4–3.1)
Reticulocyte Hemoglobin: 35.5 pg (ref >27.9)

## 2023-12-17 LAB — CBC WITH DIFFERENTIAL (CANCER CENTER ONLY)
Abs Immature Granulocytes: 0.05 K/uL (ref 0.00–0.07)
Basophils Absolute: 0.1 K/uL (ref 0.0–0.1)
Basophils Relative: 1 %
Eosinophils Absolute: 0.1 K/uL (ref 0.0–0.5)
Eosinophils Relative: 1 %
HCT: 43 % (ref 36.0–46.0)
Hemoglobin: 14.6 g/dL (ref 12.0–15.0)
Immature Granulocytes: 1 %
Lymphocytes Relative: 8 %
Lymphs Abs: 0.8 K/uL (ref 0.7–4.0)
MCH: 31.1 pg (ref 26.0–34.0)
MCHC: 34 g/dL (ref 30.0–36.0)
MCV: 91.7 fL (ref 80.0–100.0)
Monocytes Absolute: 0.5 K/uL (ref 0.1–1.0)
Monocytes Relative: 5 %
Neutro Abs: 8.5 K/uL — ABNORMAL HIGH (ref 1.7–7.7)
Neutrophils Relative %: 84 %
Platelet Count: 174 K/uL (ref 150–400)
RBC: 4.69 MIL/uL (ref 3.87–5.11)
RDW: 12.4 % (ref 11.5–15.5)
WBC Count: 10.1 K/uL (ref 4.0–10.5)
nRBC: 0 % (ref 0.0–0.2)

## 2023-12-17 LAB — CMP (CANCER CENTER ONLY)
ALT: 9 U/L (ref 0–44)
AST: 17 U/L (ref 15–41)
Albumin: 4.2 g/dL (ref 3.5–5.0)
Alkaline Phosphatase: 69 U/L (ref 38–126)
Anion gap: 5 (ref 5–15)
BUN: 17 mg/dL (ref 8–23)
CO2: 30 mmol/L (ref 22–32)
Calcium: 9.6 mg/dL (ref 8.9–10.3)
Chloride: 105 mmol/L (ref 98–111)
Creatinine: 0.81 mg/dL (ref 0.44–1.00)
GFR, Estimated: >60 mL/min (ref >60)
Glucose, Bld: 98 mg/dL (ref 70–99)
Potassium: 4.3 mmol/L (ref 3.5–5.1)
Sodium: 140 mmol/L (ref 135–145)
Total Bilirubin: 0.7 mg/dL (ref 0.0–1.2)
Total Protein: 7.2 g/dL (ref 6.5–8.1)

## 2023-12-17 LAB — IRON AND IRON BINDING CAPACITY (CC-WL,HP ONLY)
Iron: 76 ug/dL (ref 28–170)
Saturation Ratios: 21 % (ref 10.4–31.8)
TIBC: 371 ug/dL (ref 250–450)
UIBC: 295 ug/dL (ref 148–442)

## 2023-12-17 LAB — FERRITIN: Ferritin: 104 ng/mL (ref 11–307)

## 2023-12-24 ENCOUNTER — Inpatient Hospital Stay: Admitting: Hematology and Oncology

## 2023-12-24 VITALS — BP 140/76 | HR 63 | Temp 97.8°F | Resp 13 | Wt 197.2 lb

## 2023-12-24 DIAGNOSIS — D500 Iron deficiency anemia secondary to blood loss (chronic): Secondary | ICD-10-CM

## 2023-12-24 DIAGNOSIS — K922 Gastrointestinal hemorrhage, unspecified: Secondary | ICD-10-CM | POA: Diagnosis not present

## 2023-12-24 NOTE — Progress Notes (Signed)
 [facility] [phone]   [phone]  PROGRESS NOTE  Patient Care Team: Shepard Ade, MD as PCP - General (Internal Medicine) Cindie Ole DASEN, MD as PCP - Electrophysiology (Cardiology) Verlin Lonni BIRCH, MD as PCP - Cardiology (Cardiology) Verlin Lonni BIRCH, MD as Consulting Physician (Cardiology)  Hematological/Oncological History # Iron  Deficiency Anemia 2/2 to GI Bleeding 10/24/2022: Upper GI endoscopy showed 3 angiodysplastic lesions with no bleeding. Treated with argon plasma. Erythematous mucosa noted in stomach lining. Biopsy showed no H. Pylori but did not reactive gastropathy.  12/13/2022: WBC 3.9, Hgb 7.7, MCV 84.5, Plt 241 12/26/2022: establish care with Dr. Federico   Interval History:  [name] 83 y.o. female with medical history significant for IDA from GI bleeding who presents for a follow up visit. The patient's last visit was on 07/02/2023. In the interim since the last visit she has had no major changes in her health.  On exam [name] reports she has been well overall in the interim since our last visit.  She reports she has had no major health issues.  She reports that her system straightened out after her last round of IV iron .  She notes that her energy levels are good currently a 7 or 8 out of 10.  She is not having any issues with lightheadedness, dizziness, or shortness of breath.  She denies any overt signs of bleeding, bruising, or dark stools.  She denies any blood in the urine or stool.  She reports her appetite remains good.  She has not had any hospitalizations, ER visits, or other changes in her health in the interim since our last visit.  She reports that she stopped her iron  pills.  Otherwise she reports that she feels well and denies any fevers, chills, sweats, nausea, vomiting or diarrhea.  Full 10 point ROS otherwise negative.  MEDICAL HISTORY:  Past Medical History:  Diagnosis Date   Acute  inferolateral myocardial infarction (HCC) 03/24/2013   Acute MI, inferoposterior wall, initial episode of care (HCC) 03/28/2013   Acute myocardial infarction of other inferior wall, initial episode of care    Amnesia, global, transient    Arthritis    needs left knee replacement   Blood transfusion without reported diagnosis    CAD (coronary artery disease)    a. 03/2013 Infpost STEMI/PCI: LM nl, LAD min irregs, LCX 100 (3.0x12 Vision BMS), RCA  mild ostial dzs, EF 55%.   Essential hypertension, benign    Fibroids    a. uterine - spotting noted since 03/11/2013.   GERD 11/19/2007   Qualifier: Diagnosis of  By: Abran MD, Norleen SAILOR    GERD (gastroesophageal reflux disease)    Hearing loss    a. s/p cochlear implant on right, hearing aid on left.   Hernia of anterior abdominal wall    History of cardiovascular stress test    Lexiscan Myoview 8/16:  EF 58%, inferolateral scar, no ischemia; Low Risk   Hyperlipidemia    Hypothyroidism    Ischemic cardiomyopathy    Echo 7/16:  Septal and posterior lateral HK, EF 45-50%, mild LAE   IV Contrast Allergy    Near syncope 06/26/2014   Obesity    Old MI (myocardial infarction) 03/03/2014   Retrograde amnesia 06/26/2014   Rosacea    Ventricular tachycardia (HCC)    a. Immediate post pci/MI - no complications, on BB.    SURGICAL HISTORY: Past Surgical History:  Procedure Laterality Date   BIOPSY  10/24/2022  Procedure: BIOPSY;  Surgeon: Wilhelmenia Aloha Raddle., MD;  Location: [facility];  Service: Gastroenterology;;   CERVICAL POLYPECTOMY     2010   CHOLECYSTECTOMY     COCHLEAR IMPLANT     ESOPHAGOGASTRODUODENOSCOPY (EGD) WITH PROPOFOL  N/A 10/24/2022   Procedure: ESOPHAGOGASTRODUODENOSCOPY (EGD) WITH PROPOFOL ;  Surgeon: Wilhelmenia Aloha Raddle., MD;  Location: [facility];  Service: Gastroenterology;  Laterality: N/A;   GASTRIC VARICES BANDING  10/24/2022   Procedure: GASTRIC VARICES BANDING;  Surgeon: Wilhelmenia Aloha Raddle., MD;  Location:  [facility];  Service: Gastroenterology;;   HOT HEMOSTASIS N/A 10/24/2022   Procedure: HOT HEMOSTASIS (ARGON PLASMA COAGULATION/BICAP);  Surgeon: Wilhelmenia Aloha Raddle., MD;  Location: [facility];  Service: Gastroenterology;  Laterality: N/A;   LEFT HEART CATHETERIZATION WITH CORONARY ANGIOGRAM N/A 03/25/2013   Procedure: LEFT HEART CATHETERIZATION WITH CORONARY ANGIOGRAM;  Surgeon: Lonni JONETTA Cash, MD;  Location: [facility];  Service: Cardiovascular;  Laterality: N/A;   ROBOTIC ASSISTED TOTAL HYSTERECTOMY WITH BILATERAL SALPINGO OOPHERECTOMY Bilateral 08/16/2014   Procedure: ROBOTIC ASSISTED TOTAL HYSTERECTOMY WITH BILATERAL SALPINGO OOPHORECTOMY ;  Surgeon: Maurilio Ship, MD;  Location: [facility];  Service: Gynecology;  Laterality: Bilateral;   UPPER GASTROINTESTINAL ENDOSCOPY     uterine polyp     had D and C done to remove the polyp    SOCIAL HISTORY: Social History   Socioeconomic History   Marital status: Divorced    Spouse name: Not on file   Number of children: Not on file   Years of education: Not on file   Highest education level: Not on file  Occupational History   Occupation: retired from Design[employer], industrial/product work  Tobacco Use   Smoking status: Former    Current packs/day: 0.00    Average packs/day: 0.2 packs/day for 2.0 years (0.4 ttl pk-yrs)    Types: Cigarettes    Start date: 03/18/1966    Quit date: 03/18/1968    Years since quitting: 55.8   Smokeless tobacco: Never  Vaping Use   Vaping status: Never Used  Substance and Sexual Activity   Alcohol  use: No   Drug use: No   Sexual activity: Not Currently  Other Topics Concern   Not on file  Social History Narrative   Lives in [place] by herself.  Sister is nearby and is Healthcare POA.   Social Drivers of Corporate investment banker Strain: Not on file  Food Insecurity: No Food Insecurity (12/26/2022)   Hunger Vital Sign    Worried About Running Out of Food in the Last Year: Never true    Ran Out of Food in the Last  Year: Never true  Transportation Needs: No Transportation Needs (12/26/2022)   PRAPARE - Administrator, Civil Service (Medical): No    Lack of Transportation (Non-Medical): No  Physical Activity: Not on file  Stress: Not on file  Social Connections: Not on file  Intimate Partner Violence: Not At Risk (12/26/2022)   Humiliation, Afraid, Rape, and Kick questionnaire    Fear of Current or Ex-Partner: No    Emotionally Abused: No    Physically Abused: No    Sexually Abused: No    FAMILY HISTORY: Family History  Problem Relation Age of Onset   Heart failure Mother        died in her 5's.   Cancer Mother    Diabetes Mother    Heart disease Mother    Hypertension Mother    Heart attack Mother    Other Father  died in WWII   Hyperlipidemia Sister    Diabetes Sister    Heart attack Maternal Grandmother    Colon cancer Cousin    Esophageal cancer Neg Hx    Stomach cancer Neg Hx    Stroke Neg Hx    Rectal cancer Neg Hx     ALLERGIES:  is allergic to bee venom, contrast media [iodinated contrast media], fish allergy, iohexol , and crestor  [rosuvastatin ].  MEDICATIONS:  Current Outpatient Medications  Medication Sig Dispense Refill   Calcium  Carb-Cholecalciferol (CALCIUM  + D3) 600-200 MG-UNIT TABS Take 1 tablet by mouth every morning.      cetirizine  (ZYRTEC ) 5 MG tablet Take 1 tablet (5 mg total) by mouth daily. 7 tablet 0   Cyanocobalamin  (VITAMIN B 12 PO) Take 1,000 tablets by mouth daily.     furosemide  (LASIX ) 20 MG tablet Take 1 tablet (20 mg total) by mouth daily. 90 tablet 1   levothyroxine  (SYNTHROID , LEVOTHROID) 100 MCG tablet Take 100 mcg by mouth daily before breakfast.     lisinopril  (ZESTRIL ) 40 MG tablet TAKE ONE TABLET BY MOUTH DAILY 90 tablet 3   metoprolol  succinate (TOPROL -XL) 50 MG 24 hr tablet TAKE 1 & 1/2 TABLETS BY MOUTH DAILY. TAKE WITH OR immediately following a meal 135 tablet 3   metroNIDAZOLE  (METROGEL ) 0.75 % gel Apply 1 application  topically daily. Patient places on her cheeks and nose, only about once or twice a week depending on the climate.     nitroGLYCERIN  (NITROSTAT ) 0.4 MG SL tablet DISSOLVE ONE TABLET UNDER THE TONGUE AS NEEDED FOR CHEST PAIN MAY REPEAT IN FIVE MINUTES FOR TWO DOSES 25 tablet 3   pantoprazole  (PROTONIX ) 40 MG tablet Take 1 tablet (40 mg total) by mouth daily. 90 tablet 3   potassium chloride  SA (KLOR-CON  M) 20 MEQ tablet Take 1 tablet (20 mEq total) by mouth daily. 90 tablet 1   rivaroxaban  (XARELTO ) 20 MG TABS tablet Take 1 tablet (20 mg total) by mouth daily with supper.     Current Facility-Administered Medications  Medication Dose Route Frequency Provider Last Rate Last Admin   0.9 %  sodium chloride  infusion  500 mL Intravenous Once Abran Norleen SAILOR, MD        REVIEW OF SYSTEMS:   Constitutional: ( - ) fevers, ( - )  chills , ( - ) night sweats Eyes: ( - ) blurriness of vision, ( - ) double vision, ( - ) watery eyes Ears, nose, mouth, throat, and face: ( - ) mucositis, ( - ) sore throat Respiratory: ( - ) cough, ( - ) dyspnea, ( - ) wheezes Cardiovascular: ( - ) palpitation, ( - ) chest discomfort, ( - ) lower extremity swelling Gastrointestinal:  ( - ) nausea, ( - ) heartburn, ( - ) change in bowel habits Skin: ( - ) abnormal skin rashes Lymphatics: ( - ) new lymphadenopathy, ( - ) easy bruising Neurological: ( - ) numbness, ( - ) tingling, ( - ) new weaknesses Behavioral/Psych: ( - ) mood change, ( - ) new changes  All other systems were reviewed with the patient and are negative.  PHYSICAL EXAMINATION:  Vitals:   12/24/23 1046  BP: (!) 140/76  Pulse: 63  Resp: 13  Temp: 97.8 F (36.6 C)  SpO2: 100%     Filed Weights   12/24/23 1046  Weight: 197 lb 3.2 oz (89.4 kg)      GENERAL: Well-appearing elderly Caucasian female, alert, no distress and comfortable SKIN:  skin color, texture, turgor are normal, no rashes or significant lesions EYES: conjunctiva are pink and  non-injected, sclera clear LUNGS: clear to auscultation and percussion with normal breathing effort HEART: regular rate & rhythm and no murmurs and no lower extremity edema Musculoskeletal: no cyanosis of digits and no clubbing  PSYCH: alert & oriented x 3, fluent speech NEURO: no focal motor/sensory deficits  LABORATORY DATA:  I have reviewed the data as listed    Latest Ref Rng & Units 12/17/2023   10:27 AM 10/22/2023   10:34 AM 08/27/2023   10:36 AM  CBC  WBC 4.0 - 10.5 K/uL 10.1  4.4  5.0   Hemoglobin 12.0 - 15.0 g/dL 85.3  85.6  86.1   Hematocrit 36.0 - 46.0 % 43.0  42.2  40.9   Platelets 150 - 400 K/uL 174  174  169        Latest Ref Rng & Units 12/17/2023   10:27 AM 10/22/2023   10:34 AM 08/27/2023   10:36 AM  CMP  Glucose 70 - 99 mg/dL 98  888  899   BUN 8 - 23 mg/dL 17  16  18    Creatinine 0.44 - 1.00 mg/dL 9.18  9.30  9.31   Sodium 135 - 145 mmol/L 140  139  140   Potassium 3.5 - 5.1 mmol/L 4.3  4.1  4.0   Chloride 98 - 111 mmol/L 105  105  105   CO2 22 - 32 mmol/L 30  27  31    Calcium  8.9 - 10.3 mg/dL 9.6  9.2  9.4   Total Protein 6.5 - 8.1 g/dL 7.2  7.1  7.0   Total Bilirubin 0.0 - 1.2 mg/dL 0.7  0.7  0.6   Alkaline Phos 38 - 126 U/L 69  66  62   AST 15 - 41 U/L 17  16  16    ALT 0 - 44 U/L 9  8  9     RADIOGRAPHIC STUDIES: No results found.   ASSESSMENT & PLAN [name] 83 y.o. female with medical history significant for CAD, GERD, HTN, HLD, hypothyroidism, and obesity present for evaluation of iron  deficiency anemia 2/2 to GI bleeding.    After review of the labs, review of the records, and discussion with the patient the patients findings are most consistent with anemia secondary to bleeding AVMs.   # Iron  Deficiency Anemia 2/2 to GI Bleeding -- Findings are consistent with iron  deficiency anemia secondary to patient's AVMs --Encouraged her to follow-up with GI for continued evaluation of her GI bleeding. Agree with capsule endoscopy if iron  deficiency  is persistent despite continued iron  infusions.  --labs today show white blood cell 10.1, hemoglobin 14.6, MCV 91.7, platelets 174.  Ferritin 104 --Continue iron  supplementation daily with a source of vitamin C --We will plan to proceed with IV iron  therapy in order to help bolster the patient's blood counts if she has another drop in iron  levels.  Patient will require premedication with Benadryl  and Pepcid as she received Monoferric  last time and got hives. --RTC every 12 weeks for labs and in 6 months for clinic visit.    No orders of the defined types were placed in this encounter.   All questions were answered. The patient knows to call the clinic with any problems, questions or concerns.  A total of more than 30 minutes were spent on this encounter with face-to-face time and non-face-to-face time, including preparing to see the patient, ordering tests and/or  medications, counseling the patient and coordination of care as outlined above.   Norleen IVAR Kidney, MD Department of Hematology/Oncology [facility] Phone: [phone] Pager: [phone] Email: [email or web address]  12/30/2023 3:34 PM

## 2024-01-07 DIAGNOSIS — H903 Sensorineural hearing loss, bilateral: Secondary | ICD-10-CM | POA: Diagnosis not present

## 2024-01-15 ENCOUNTER — Other Ambulatory Visit (HOSPITAL_COMMUNITY): Payer: Self-pay

## 2024-01-15 DIAGNOSIS — Z23 Encounter for immunization: Secondary | ICD-10-CM | POA: Diagnosis not present

## 2024-01-15 MED ORDER — FLUZONE HIGH-DOSE 0.5 ML IM SUSY
0.5000 mL | PREFILLED_SYRINGE | Freq: Once | INTRAMUSCULAR | 0 refills | Status: AC
  Filled 2024-01-15: qty 0.5, 1d supply, fill #0

## 2024-03-23 ENCOUNTER — Other Ambulatory Visit: Payer: Self-pay | Admitting: Hematology and Oncology

## 2024-03-23 DIAGNOSIS — D500 Iron deficiency anemia secondary to blood loss (chronic): Secondary | ICD-10-CM

## 2024-03-24 ENCOUNTER — Inpatient Hospital Stay: Attending: Hematology and Oncology

## 2024-03-24 DIAGNOSIS — K922 Gastrointestinal hemorrhage, unspecified: Secondary | ICD-10-CM | POA: Diagnosis present

## 2024-03-24 DIAGNOSIS — D500 Iron deficiency anemia secondary to blood loss (chronic): Secondary | ICD-10-CM | POA: Insufficient documentation

## 2024-03-24 LAB — CBC WITH DIFFERENTIAL (CANCER CENTER ONLY)
Abs Immature Granulocytes: 0.02 K/uL (ref 0.00–0.07)
Basophils Absolute: 0.1 K/uL (ref 0.0–0.1)
Basophils Relative: 1 %
Eosinophils Absolute: 0.3 K/uL (ref 0.0–0.5)
Eosinophils Relative: 5 %
HCT: 42.1 % (ref 36.0–46.0)
Hemoglobin: 14 g/dL (ref 12.0–15.0)
Immature Granulocytes: 0 %
Lymphocytes Relative: 19 %
Lymphs Abs: 1 K/uL (ref 0.7–4.0)
MCH: 30.9 pg (ref 26.0–34.0)
MCHC: 33.3 g/dL (ref 30.0–36.0)
MCV: 92.9 fL (ref 80.0–100.0)
Monocytes Absolute: 0.3 K/uL (ref 0.1–1.0)
Monocytes Relative: 6 %
Neutro Abs: 3.8 K/uL (ref 1.7–7.7)
Neutrophils Relative %: 69 %
Platelet Count: 195 K/uL (ref 150–400)
RBC: 4.53 MIL/uL (ref 3.87–5.11)
RDW: 12.7 % (ref 11.5–15.5)
WBC Count: 5.4 K/uL (ref 4.0–10.5)
nRBC: 0 % (ref 0.0–0.2)

## 2024-03-24 LAB — CMP (CANCER CENTER ONLY)
ALT: 11 U/L (ref 0–44)
AST: 22 U/L (ref 15–41)
Albumin: 4.2 g/dL (ref 3.5–5.0)
Alkaline Phosphatase: 69 U/L (ref 38–126)
Anion gap: 9 (ref 5–15)
BUN: 21 mg/dL (ref 8–23)
CO2: 26 mmol/L (ref 22–32)
Calcium: 9.4 mg/dL (ref 8.9–10.3)
Chloride: 102 mmol/L (ref 98–111)
Creatinine: 0.85 mg/dL (ref 0.44–1.00)
GFR, Estimated: >60 mL/min
Glucose, Bld: 151 mg/dL — ABNORMAL HIGH (ref 70–99)
Potassium: 4.6 mmol/L (ref 3.5–5.1)
Sodium: 138 mmol/L (ref 135–145)
Total Bilirubin: 0.5 mg/dL (ref 0.0–1.2)
Total Protein: 7.3 g/dL (ref 6.5–8.1)

## 2024-03-24 LAB — FERRITIN: Ferritin: 77 ng/mL (ref 11–307)

## 2024-03-24 LAB — RETIC PANEL
Immature Retic Fract: 1.2 % — ABNORMAL LOW (ref 2.3–15.9)
RBC.: 4.46 MIL/uL (ref 3.87–5.11)
Retic Count, Absolute: 38.8 K/uL (ref 19.0–186.0)
Retic Ct Pct: 0.9 % (ref 0.4–3.1)
Reticulocyte Hemoglobin: 34.4 pg

## 2024-03-24 LAB — IRON AND IRON BINDING CAPACITY (CC-WL,HP ONLY)
Iron: 89 ug/dL (ref 28–170)
Saturation Ratios: 24 % (ref 10.4–31.8)
TIBC: 371 ug/dL (ref 250–450)
UIBC: 282 ug/dL

## 2024-03-26 ENCOUNTER — Ambulatory Visit: Payer: Self-pay | Admitting: *Deleted

## 2024-06-23 ENCOUNTER — Inpatient Hospital Stay: Attending: Hematology and Oncology

## 2024-06-23 ENCOUNTER — Inpatient Hospital Stay: Admitting: Physician Assistant
